# Patient Record
Sex: Male | Born: 1950 | Race: White | Hispanic: No | Smoking: Never smoker
Health system: Southern US, Community
[De-identification: ages and names within clinical notes are randomized; demographics above are authoritative.]

## PROBLEM LIST (undated history)

## (undated) DIAGNOSIS — I639 Cerebral infarction, unspecified: Secondary | ICD-10-CM

## (undated) DIAGNOSIS — Z87442 Personal history of urinary calculi: Secondary | ICD-10-CM

## (undated) DIAGNOSIS — T7840XA Allergy, unspecified, initial encounter: Secondary | ICD-10-CM

## (undated) DIAGNOSIS — K219 Gastro-esophageal reflux disease without esophagitis: Secondary | ICD-10-CM

## (undated) DIAGNOSIS — M109 Gout, unspecified: Secondary | ICD-10-CM

## (undated) DIAGNOSIS — H269 Unspecified cataract: Secondary | ICD-10-CM

## (undated) DIAGNOSIS — IMO0001 Reserved for inherently not codable concepts without codable children: Secondary | ICD-10-CM

## (undated) DIAGNOSIS — M1990 Unspecified osteoarthritis, unspecified site: Secondary | ICD-10-CM

## (undated) DIAGNOSIS — I219 Acute myocardial infarction, unspecified: Secondary | ICD-10-CM

## (undated) DIAGNOSIS — N200 Calculus of kidney: Secondary | ICD-10-CM

## (undated) DIAGNOSIS — H9190 Unspecified hearing loss, unspecified ear: Secondary | ICD-10-CM

## (undated) DIAGNOSIS — Z92241 Personal history of systemic steroid therapy: Secondary | ICD-10-CM

## (undated) DIAGNOSIS — I10 Essential (primary) hypertension: Secondary | ICD-10-CM

## (undated) HISTORY — DX: Essential (primary) hypertension: I10

## (undated) HISTORY — DX: Personal history of urinary calculi: Z87.442

## (undated) HISTORY — PX: SHOULDER SURGERY: SHX246

## (undated) HISTORY — DX: Unspecified hearing loss, unspecified ear: H91.90

## (undated) HISTORY — DX: Unspecified cataract: H26.9

## (undated) HISTORY — DX: Calculus of kidney: N20.0

## (undated) HISTORY — DX: Gastro-esophageal reflux disease without esophagitis: K21.9

## (undated) HISTORY — DX: Allergy, unspecified, initial encounter: T78.40XA

## (undated) HISTORY — PX: GALLBLADDER SURGERY: SHX652

## (undated) HISTORY — DX: Unspecified osteoarthritis, unspecified site: M19.90

## (undated) HISTORY — DX: Gout, unspecified: M10.9

## (undated) HISTORY — DX: Cerebral infarction, unspecified: I63.9

## (undated) HISTORY — PX: BACK SURGERY: SHX140

## (undated) HISTORY — PX: FOOT SURGERY: SHX648

## (undated) HISTORY — PX: SPINE SURGERY: SHX786

## (undated) HISTORY — PX: CHOLECYSTECTOMY: SHX55

## (undated) HISTORY — DX: Reserved for inherently not codable concepts without codable children: IMO0001

## (undated) HISTORY — DX: Acute myocardial infarction, unspecified: I21.9

---
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
## 2011-01-28 HISTORY — PX: EYE SURGERY: SHX253

## 2011-02-18 ENCOUNTER — Ambulatory Visit: Payer: Self-pay | Admitting: Family Medicine

## 2012-11-17 ENCOUNTER — Encounter: Payer: Self-pay | Admitting: Podiatry

## 2012-11-17 ENCOUNTER — Ambulatory Visit (INDEPENDENT_AMBULATORY_CARE_PROVIDER_SITE_OTHER): Payer: PRIVATE HEALTH INSURANCE | Admitting: Podiatry

## 2012-11-17 VITALS — BP 132/82 | HR 62 | Resp 20 | Ht 70.0 in | Wt 220.0 lb

## 2012-11-17 DIAGNOSIS — M722 Plantar fascial fibromatosis: Secondary | ICD-10-CM

## 2012-11-17 NOTE — Progress Notes (Signed)
Thomas Miller presents today for the third followup of his plantar fasciitis to the left foot. At this point he states is doing quite well. Approximately 80-85% better. He states that he wears his night splint occasionally. He is taking his Mobic on a daily basis. He has purchased different pair shoes.  Objective: Vital signs are stable he is alert and oriented x3. Pulses are palpable left lower extremity. Pain on palpation medial calcaneal tubercle of the left heel.  Assessment: Plantar fasciitis slowly resolving left.  Plan: Injected the left heel today with 20 mg of Kenalog and local anesthetic. Discussed the possible need for orthotics. We'll followup with him in one month.

## 2012-12-15 ENCOUNTER — Ambulatory Visit: Payer: PRIVATE HEALTH INSURANCE | Admitting: Podiatry

## 2012-12-15 ENCOUNTER — Ambulatory Visit (INDEPENDENT_AMBULATORY_CARE_PROVIDER_SITE_OTHER): Payer: PRIVATE HEALTH INSURANCE | Admitting: Podiatry

## 2012-12-15 ENCOUNTER — Encounter: Payer: Self-pay | Admitting: Podiatry

## 2012-12-15 VITALS — BP 139/81 | HR 71 | Resp 16 | Ht 70.0 in | Wt 215.0 lb

## 2012-12-15 DIAGNOSIS — L600 Ingrowing nail: Secondary | ICD-10-CM

## 2012-12-15 DIAGNOSIS — M722 Plantar fascial fibromatosis: Secondary | ICD-10-CM

## 2012-12-15 MED ORDER — NEOMYCIN-POLYMYXIN-HC 3.5-10000-1 OT SOLN: OTIC | Status: DC

## 2012-12-15 NOTE — Patient Instructions (Signed)

## 2012-12-15 NOTE — Progress Notes (Signed)
Thomas Miller presents today for followup of his plantar fasciitis. He states that his 85-90% better. He states that he stood on a ladder the other day while at work and hurt his left foot at the plantar fascial calcaneal insertion site. He also complaining of a painful ingrown toenail to the hallux left. States it is had trouble with at all his life in like to have the sides removed.  Objective: Pulses are palpable bilateral vital signs are stable he is alert and oriented x3. He still has pain on palpation medial calcaneal tubercle of the left heel. He has sharp incurvated nail margins along the tibial and fibular border of the hallux left. No erythema saline is drainage or odor.  Assessment: Intractable plantar fasciitis left. Ingrown nail tibial and fibular border hallux left.  Plan: Continue all conservative therapies for the treatment of his plantar fasciitis. And he will be scan for orthotics today. We performed a chemical matrixectomy to the tibial and fibular border of the hallux left. This was performed after 3 cc of 50-50 mixture of Marcaine plain and lidocaine plain was infiltrated in a hallux block about the left foot. The left foot was then prepped and draped in is normal sterile fashion. The margins of the nail were then split from distal to proximal of all since total revealing the matrix and the nailbed. 3 applications of phenol were applied to the matrix of the nailbed it was then neutralized with isopropyl alcohol. Silvadene cream Telfa pad and a dry sterile compressive dressing was applied. He was given both oral and written home-going instructions for the care of this he was her soaking on a twice a day basis in Betadine and water. I wrote her prescription for Cortisporin Otic which she will pick up an apply on a twice a day basis as well. We'll followup with him in one week.

## 2012-12-22 ENCOUNTER — Encounter: Payer: Self-pay | Admitting: Podiatry

## 2012-12-22 ENCOUNTER — Ambulatory Visit (INDEPENDENT_AMBULATORY_CARE_PROVIDER_SITE_OTHER): Payer: PRIVATE HEALTH INSURANCE | Admitting: Podiatry

## 2012-12-22 VITALS — BP 141/80 | HR 64 | Resp 16 | Ht 70.0 in | Wt 215.0 lb

## 2012-12-22 DIAGNOSIS — L600 Ingrowing nail: Secondary | ICD-10-CM

## 2012-12-22 NOTE — Progress Notes (Signed)
Thomas Miller presents today for followup of his matrixectomy hallux left. He continues to soak in Betadine warm water and he does have slight rash to the dorsal aspect of the left foot according him.  Objective: Vital signs are stable he is alert and oriented x3. Strong palpable pulses left lower extremity. Minimal tenderness on palpation medial calcaneal tubercle of the left heel. There is no erythema edema saline is drainage or odor to the hallux tibial and fibular border left foot. Appears to be healing very nicely.  Assessment: Well-healing surgical toe hallux left. Residual plantar fasciitis left foot.  Plan: Discontinue the use of Betadine do to the dermatitis. Start with Epsom salts in warm water soaks still twice daily continued to apply the Cortisporin Otic as directed. Cover during the day and leave open at night. I will followup with him with his orthotics come in.

## 2013-01-06 ENCOUNTER — Encounter: Payer: Self-pay | Admitting: Podiatry

## 2013-01-10 ENCOUNTER — Ambulatory Visit (INDEPENDENT_AMBULATORY_CARE_PROVIDER_SITE_OTHER): Payer: PRIVATE HEALTH INSURANCE | Admitting: Podiatry

## 2013-01-10 DIAGNOSIS — M722 Plantar fascial fibromatosis: Secondary | ICD-10-CM

## 2013-01-10 NOTE — Patient Instructions (Signed)

## 2013-01-10 NOTE — Progress Notes (Signed)
Thomas Miller presented today to pick up his orthotics he was given both oral and written home-going instructions I will followup with him in one month to reevaluate.

## 2013-02-07 ENCOUNTER — Ambulatory Visit: Payer: PRIVATE HEALTH INSURANCE | Admitting: Podiatry

## 2013-02-16 ENCOUNTER — Encounter: Payer: Self-pay | Admitting: Podiatry

## 2013-02-16 ENCOUNTER — Ambulatory Visit (INDEPENDENT_AMBULATORY_CARE_PROVIDER_SITE_OTHER): Payer: PRIVATE HEALTH INSURANCE | Admitting: Podiatry

## 2013-02-16 VITALS — BP 145/90 | HR 70 | Resp 18

## 2013-02-16 DIAGNOSIS — L600 Ingrowing nail: Secondary | ICD-10-CM

## 2013-02-16 MED ORDER — NEOMYCIN-POLYMYXIN-HC 3.5-10000-1 OT SOLN
OTIC | Status: DC
Start: 2013-02-16 — End: 2014-10-25

## 2013-02-16 NOTE — Patient Instructions (Addendum)

## 2013-02-16 NOTE — Progress Notes (Signed)
The orthotics are wonderful,  i have a toenail that is giving me problems, the right great toenail both corners hurt. He states it is been hurting for quite sometimes other problems with this for years now time to get it fixed.  Objective: Vital signs are stable he is alert and oriented x3. Pulses are strongly palpable right lower extremity. Sharp incurvated nail margins with erythema and edema along the tibial and fibular borders of the hallux right.  Assessment: Ingrown nail paronychia hallux right.  Plan: Chemical matrixectomy was performed today to the tibial and fibular border of the hallux right after local anesthetic was provided. He was her soaking on a twice a day basis and Betadine water and apply Cortisporin otic as directed I will followup with him in one week to

## 2013-02-23 ENCOUNTER — Ambulatory Visit (INDEPENDENT_AMBULATORY_CARE_PROVIDER_SITE_OTHER): Payer: PRIVATE HEALTH INSURANCE | Admitting: Podiatry

## 2013-02-23 ENCOUNTER — Encounter: Payer: Self-pay | Admitting: Podiatry

## 2013-02-23 VITALS — BP 127/87 | HR 86 | Resp 16 | Ht 70.0 in | Wt 215.0 lb

## 2013-02-23 DIAGNOSIS — Z9889 Other specified postprocedural states: Secondary | ICD-10-CM

## 2013-02-23 NOTE — Progress Notes (Signed)
Peyton NajjarLarry presents today one week status post matrixectomy tibial and fibular border of the hallux right. Continues to soak in Epsom salts.  Objective: Vital signs are stable he is alert and oriented x3. Currently there is no erythema edema cellulitis drainage or odor to the tibial and fibular border of the hallux right.  Assessment: Well-healing surgical toe hallux right.  Plan: Increase concentration of salt and water. Continue so twice daily until completely healed. He'll covered in the day and leave open at night.

## 2014-10-25 ENCOUNTER — Encounter: Payer: Self-pay | Admitting: Family Medicine

## 2014-10-25 ENCOUNTER — Ambulatory Visit (INDEPENDENT_AMBULATORY_CARE_PROVIDER_SITE_OTHER): Payer: PRIVATE HEALTH INSURANCE | Admitting: Family Medicine

## 2014-10-25 VITALS — BP 150/86 | HR 58 | Temp 97.6°F | Ht 68.8 in | Wt 224.0 lb

## 2014-10-25 DIAGNOSIS — N400 Enlarged prostate without lower urinary tract symptoms: Secondary | ICD-10-CM | POA: Diagnosis not present

## 2014-10-25 DIAGNOSIS — K219 Gastro-esophageal reflux disease without esophagitis: Secondary | ICD-10-CM | POA: Diagnosis not present

## 2014-10-25 DIAGNOSIS — IMO0001 Reserved for inherently not codable concepts without codable children: Secondary | ICD-10-CM

## 2014-10-25 DIAGNOSIS — M1000 Idiopathic gout, unspecified site: Secondary | ICD-10-CM

## 2014-10-25 DIAGNOSIS — Z0000 Encounter for general adult medical examination without abnormal findings: Secondary | ICD-10-CM | POA: Diagnosis not present

## 2014-10-25 DIAGNOSIS — I10 Essential (primary) hypertension: Secondary | ICD-10-CM | POA: Diagnosis not present

## 2014-10-25 DIAGNOSIS — E785 Hyperlipidemia, unspecified: Secondary | ICD-10-CM | POA: Diagnosis not present

## 2014-10-25 DIAGNOSIS — M109 Gout, unspecified: Secondary | ICD-10-CM | POA: Insufficient documentation

## 2014-10-25 HISTORY — DX: Benign prostatic hyperplasia without lower urinary tract symptoms: N40.0

## 2014-10-25 HISTORY — DX: Reserved for inherently not codable concepts without codable children: IMO0001

## 2014-10-25 HISTORY — DX: Essential (primary) hypertension: I10

## 2014-10-25 HISTORY — DX: Hyperlipidemia, unspecified: E78.5

## 2014-10-25 HISTORY — DX: Gastro-esophageal reflux disease without esophagitis: K21.9

## 2014-10-25 MED ORDER — MELOXICAM 15 MG PO TABS: 15.0000 mg | ORAL_TABLET | Freq: Every day | ORAL | Status: DC

## 2014-10-25 MED ORDER — VALSARTAN 160 MG PO TABS: 160.0000 mg | ORAL_TABLET | Freq: Every day | ORAL | Status: DC

## 2014-10-25 MED ORDER — ESOMEPRAZOLE MAGNESIUM 40 MG PO CPDR: 40.0000 mg | DELAYED_RELEASE_CAPSULE | Freq: Every day | ORAL | Status: DC

## 2014-10-25 MED ORDER — ALLOPURINOL 100 MG PO TABS: 100.0000 mg | ORAL_TABLET | Freq: Every day | ORAL | Status: DC

## 2014-10-25 MED ORDER — ATORVASTATIN CALCIUM 20 MG PO TABS: 20.0000 mg | ORAL_TABLET | Freq: Every day | ORAL | Status: DC

## 2014-10-25 NOTE — Assessment & Plan Note (Signed)
The current medical regimen is effective;  continue present plan and medications.  

## 2014-10-25 NOTE — Addendum Note (Signed)
Addended byVonita Moss on: 10/25/2014 09:46 AM   Modules accepted: Kipp Brood

## 2014-10-25 NOTE — Progress Notes (Signed)
BP 150/86 mmHg  Pulse 58  Temp(Src) 97.6 F (36.4 C)  Ht 5' 8.8" (1.748 m)  Wt 224 lb (101.606 kg)  BMI 33.25 kg/m2  SpO2 99%   Subjective:    Patient ID: Thomas Miller, male    DOB: 1950/03/12, 64 y.o.   MRN: [id number]  HPI: Thomas Miller is a 64 y.o. male  Chief Complaint  Patient presents with  . Annual Exam   Patient doing well no symptoms of gout no problems from medication. Reflux doing well on Nexium no issues Meloxicam specially for right hand and shoulder doing well Blood pressure good control no side effects from medications Lipitor also doing well no side effects. Takes medications faithfully without problems  retiring next month  Relevant past medical, surgical, family and social history reviewed and updated as indicated. Interim medical history since our last visit reviewed. Allergies and medications reviewed and updated.  Review of Systems  Constitutional: Negative.   HENT: Negative.   Eyes: Negative.   Respiratory: Negative.   Cardiovascular: Negative.   Gastrointestinal: Negative.   Endocrine: Negative.   Genitourinary: Negative.   Musculoskeletal: Negative.   Skin: Negative.   Allergic/Immunologic: Negative.   Neurological: Negative.   Hematological: Negative.   Psychiatric/Behavioral: Negative.     Per HPI unless specifically indicated above     Objective:    BP 150/86 mmHg  Pulse 58  Temp(Src) 97.6 F (36.4 C)  Ht 5' 8.8" (1.748 m)  Wt 224 lb (101.606 kg)  BMI 33.25 kg/m2  SpO2 99%  Wt Readings from Last 3 Encounters:  10/25/14 224 lb (101.606 kg)  02/23/13 215 lb (97.523 kg)  12/22/12 215 lb (97.523 kg)    Physical Exam  Constitutional: He is oriented to person, place, and time. He appears well-developed and well-nourished.  HENT:  Head: Normocephalic and atraumatic.  Right Ear: External ear normal.  Left Ear: External ear normal.  Eyes: Conjunctivae and EOM are normal. Pupils are equal, round, and reactive to light.   Neck: Normal range of motion. Neck supple.  Cardiovascular: Normal rate, regular rhythm, normal heart sounds and intact distal pulses.   Pulmonary/Chest: Effort normal and breath sounds normal.  Abdominal: Soft. Bowel sounds are normal. There is no splenomegaly or hepatomegaly.  Genitourinary: Rectum normal and penis normal.  Prostate enlarged  Musculoskeletal: Normal range of motion.  Neurological: He is alert and oriented to person, place, and time. He has normal reflexes.  Skin: No rash noted. No erythema.  Psychiatric: He has a normal mood and affect. His behavior is normal. Judgment and thought content normal.    No results found for this or any previous visit.    Assessment & Plan:   Problem List Items Addressed This Visit      Cardiovascular and Mediastinum   Hypertension - Primary    The current medical regimen is effective;  continue present plan and medications.       Relevant Medications   atorvastatin (LIPITOR) 20 MG tablet   valsartan (DIOVAN) 160 MG tablet   Other Relevant Orders   Comprehensive metabolic panel   Lipid panel   CBC with Differential/Platelet   Urinalysis, Routine w reflex microscopic (not at [facility]   TSH     Genitourinary   BPH (benign prostatic hyperplasia)   Relevant Orders   PSA     Other   Gout    The current medical regimen is effective;  continue present plan and medications.  Relevant Medications   allopurinol (ZYLOPRIM) 100 MG tablet   Reflux    The current medical regimen is effective;  continue present plan and medications.       Relevant Medications   esomeprazole (NEXIUM) 40 MG capsule   Hyperlipemia    The current medical regimen is effective;  continue present plan and medications.       Relevant Medications   atorvastatin (LIPITOR) 20 MG tablet   valsartan (DIOVAN) 160 MG tablet   Other Relevant Orders   Comprehensive metabolic panel   Lipid panel   CBC with Differential/Platelet   Urinalysis, Routine  w reflex microscopic (not at [facility])   TSH    Other Visit Diagnoses    PE (physical exam), annual        Relevant Orders    Comprehensive metabolic panel    Lipid panel    CBC with Differential/Platelet    PSA    Urinalysis, Routine w reflex microscopic (not at [facility])    TSH       Patient are to has a living Will health care power of attorney state plan etc. Welcome to [facility] metrics met and discussed.  Follow up plan: Return in about 6 months (around 04/24/2015), or if symptoms worsen or fail to improve, for BMP, lipids,alt, ast.

## 2014-10-26 ENCOUNTER — Encounter: Payer: Self-pay | Admitting: Family Medicine

## 2014-10-26 LAB — CBC WITH DIFFERENTIAL/PLATELET
Basophils Absolute: 0 10*3/uL (ref 0.0–0.2)
Basos: 1 %
EOS (ABSOLUTE): 0.2 10*3/uL (ref 0.0–0.4)
Eos: 2 %
Hematocrit: 47.6 % (ref 37.5–51.0)
Hemoglobin: 16.2 g/dL (ref 12.6–17.7)
Immature Grans (Abs): 0 10*3/uL (ref 0.0–0.1)
Immature Granulocytes: 1 %
Lymphocytes Absolute: 2.3 10*3/uL (ref 0.7–3.1)
Lymphs: 32 %
MCH: 29.4 pg (ref 26.6–33.0)
MCHC: 34 g/dL (ref 31.5–35.7)
MCV: 86 fL (ref 79–97)
Monocytes Absolute: 0.7 10*3/uL (ref 0.1–0.9)
Monocytes: 9 %
Neutrophils Absolute: 4 10*3/uL (ref 1.4–7.0)
Neutrophils: 55 %
Platelets: 205 10*3/uL (ref 150–379)
RBC: 5.51 x10E6/uL (ref 4.14–5.80)
RDW: 14.6 % (ref 12.3–15.4)
WBC: 7.2 10*3/uL (ref 3.4–10.8)

## 2014-10-26 LAB — COMPREHENSIVE METABOLIC PANEL
ALT: 40 IU/L (ref 0–44)
AST: 25 IU/L (ref 0–40)
Albumin/Globulin Ratio: 2.4 (ref 1.1–2.5)
Albumin: 4.7 g/dL (ref 3.6–4.8)
Alkaline Phosphatase: 81 IU/L (ref 39–117)
BUN/Creatinine Ratio: 16 (ref 10–22)
BUN: 15 mg/dL (ref 8–27)
Bilirubin Total: 0.7 mg/dL (ref 0.0–1.2)
CO2: 25 mmol/L (ref 18–29)
Calcium: 9.5 mg/dL (ref 8.6–10.2)
Chloride: 104 mmol/L (ref 97–108)
Creatinine, Ser: 0.96 mg/dL (ref 0.76–1.27)
GFR calc Af Amer: 95 mL/min/{1.73_m2} (ref >59)
GFR calc non Af Amer: 83 mL/min/{1.73_m2} (ref >59)
Globulin, Total: 2 g/dL (ref 1.5–4.5)
Glucose: 93 mg/dL (ref 65–99)
Potassium: 5 mmol/L (ref 3.5–5.2)
Sodium: 142 mmol/L (ref 134–144)
Total Protein: 6.7 g/dL (ref 6.0–8.5)

## 2014-10-26 LAB — LIPID PANEL
Chol/HDL Ratio: 3.4 ratio units (ref 0.0–5.0)
Cholesterol, Total: 151 mg/dL (ref 100–199)
HDL: 45 mg/dL (ref >39)
LDL Calculated: 80 mg/dL (ref 0–99)
Triglycerides: 129 mg/dL (ref 0–149)
VLDL Cholesterol Cal: 26 mg/dL (ref 5–40)

## 2014-10-26 LAB — PSA: Prostate Specific Ag, Serum: 2.5 ng/mL (ref 0.0–4.0)

## 2014-10-26 LAB — TSH: TSH: 2.21 u[IU]/mL (ref 0.450–4.500)

## 2014-10-27 LAB — URINALYSIS, ROUTINE W REFLEX MICROSCOPIC
Bilirubin, UA: NEGATIVE
Glucose, UA: NEGATIVE
Ketones, UA: NEGATIVE
Leukocytes, UA: NEGATIVE
Nitrite, UA: NEGATIVE
Protein, UA: NEGATIVE
RBC, UA: NEGATIVE
Specific Gravity, UA: 1.025 (ref 1.005–1.030)
Urobilinogen, Ur: 0.2 mg/dL (ref 0.2–1.0)
pH, UA: 5.5 (ref 5.0–7.5)

## 2015-01-15 NOTE — Telephone Encounter (Signed)
This encounter was created in error - please disregard.

## 2015-02-21 ENCOUNTER — Other Ambulatory Visit: Payer: Self-pay | Admitting: Family Medicine

## 2015-04-26 ENCOUNTER — Ambulatory Visit: Payer: Medicare Other | Admitting: Family Medicine

## 2015-05-07 ENCOUNTER — Encounter: Payer: Self-pay | Admitting: Family Medicine

## 2015-05-07 ENCOUNTER — Ambulatory Visit (INDEPENDENT_AMBULATORY_CARE_PROVIDER_SITE_OTHER): Payer: Medicare Other | Admitting: Family Medicine

## 2015-05-07 VITALS — BP 135/70 | HR 71 | Temp 97.9°F | Ht 69.0 in | Wt 221.0 lb

## 2015-05-07 DIAGNOSIS — M1000 Idiopathic gout, unspecified site: Secondary | ICD-10-CM | POA: Diagnosis not present

## 2015-05-07 DIAGNOSIS — Z113 Encounter for screening for infections with a predominantly sexual mode of transmission: Secondary | ICD-10-CM | POA: Diagnosis not present

## 2015-05-07 DIAGNOSIS — E785 Hyperlipidemia, unspecified: Secondary | ICD-10-CM | POA: Diagnosis not present

## 2015-05-07 DIAGNOSIS — I10 Essential (primary) hypertension: Secondary | ICD-10-CM

## 2015-05-07 LAB — LP+ALT+AST PICCOLO, WAIVED
ALT (SGPT) Piccolo, Waived: 38 U/L (ref 10–47)
AST (SGOT) Piccolo, Waived: 37 U/L (ref 11–38)
Chol/HDL Ratio Piccolo,Waive: 3.7 mg/dL
Cholesterol Piccolo, Waived: 160 mg/dL (ref <200)
HDL Chol Piccolo, Waived: 43 mg/dL — ABNORMAL LOW (ref >59)
LDL Chol Calc Piccolo Waived: 42 mg/dL (ref <100)
Triglycerides Piccolo,Waived: 376 mg/dL — ABNORMAL HIGH (ref <150)
VLDL Chol Calc Piccolo,Waive: 75 mg/dL — ABNORMAL HIGH (ref <30)

## 2015-05-07 NOTE — Assessment & Plan Note (Signed)
The current medical regimen is effective;  continue present plan and medications.  

## 2015-05-07 NOTE — Progress Notes (Signed)
BP 135/70 mmHg  Pulse 71  Temp(Src) 97.9 F (36.6 C)  Ht  (1.753 m)  Wt 221 lb (100.245 kg)  BMI 32.62 kg/m2  SpO2 96%   Subjective:    Patient ID: Thomas Miller, male    DOB: [birth year], 65 y.o.   MRN: [id number]  HPI: Thomas Miller is a 65 y.o. male  Chief Complaint  Patient presents with  . Hyperlipidemia  . Hypertension  . ear itching    was ringing but that has resolved  Patient doing well no complaints from medications had some left ear itching some ringing for one day which is now resolved. Has tried some sweet oil in his ear which is helped a little bit Has completely retired and blood pressure and cholesterol are doing well with no complaints. Actually better than in some time with retirement.  Relevant past medical, surgical, family and social history reviewed and updated as indicated. Interim medical history since our last visit reviewed. Allergies and medications reviewed and updated.  Review of Systems  Constitutional: Negative.   Respiratory: Negative.   Cardiovascular: Negative.     Per HPI unless specifically indicated above     Objective:    BP 135/70 mmHg  Pulse 71  Temp(Src) 97.9 F (36.6 C)  Ht  (1.753 m)  Wt 221 lb (100.245 kg)  BMI 32.62 kg/m2  SpO2 96%  Wt Readings from Last 3 Encounters:  05/07/15 221 lb (100.245 kg)  10/25/14 224 lb (101.606 kg)  02/23/13 215 lb (97.523 kg)    Physical Exam  HENT:  Right Ear: External ear normal.  Left Ear: External ear normal.  Mouth/Throat: Oropharynx is clear and moist. No oropharyngeal exudate.  Cardiovascular: Normal rate, regular rhythm and normal heart sounds.   Pulmonary/Chest: Breath sounds normal.    Results for orders placed or performed in visit on 10/25/14  Comprehensive metabolic panel  Result Value Ref Range   Glucose 93 65 - 99 mg/dL   BUN 15 8 - 27 mg/dL   Creatinine, Ser 1.32 0.76 - 1.27 mg/dL   GFR calc non Af Amer 83 >59 mL/min/1.73   GFR calc Af Amer 95 >59  mL/min/1.73   BUN/Creatinine Ratio 16 10 - 22   Sodium 142 134 - 144 mmol/L   Potassium 5.0 3.5 - 5.2 mmol/L   Chloride 104 97 - 108 mmol/L   CO2 25 18 - 29 mmol/L   Calcium 9.5 8.6 - 10.2 mg/dL   Total Protein 6.7 6.0 - 8.5 g/dL   Albumin 4.7 3.6 - 4.8 g/dL   Globulin, Total 2.0 1.5 - 4.5 g/dL   Albumin/Globulin Ratio 2.4 1.1 - 2.5   Bilirubin Total 0.7 0.0 - 1.2 mg/dL   Alkaline Phosphatase 81 39 - 117 IU/L   AST 25 0 - 40 IU/L   ALT 40 0 - 44 IU/L  Lipid panel  Result Value Ref Range   Cholesterol, Total 151 100 - 199 mg/dL   Triglycerides 440 0 - 149 mg/dL   HDL 45 >10 mg/dL   VLDL Cholesterol Cal 26 5 - 40 mg/dL   LDL Calculated 80 0 - 99 mg/dL   Chol/HDL Ratio 3.4 0.0 - 5.0 ratio units  CBC with Differential/Platelet  Result Value Ref Range   WBC 7.2 3.4 - 10.8 x10E3/uL   RBC 5.51 4.14 - 5.80 x10E6/uL   Hemoglobin 16.2 12.6 - 17.7 g/dL   Hematocrit 27.2 53.6 - 51.0 %   MCV 86 79 -  97 fL   MCH 29.4 26.6 - 33.0 pg   MCHC 34.0 31.5 - 35.7 g/dL   RDW 96.014.6 45.412.3 - 09.815.4 %   Platelets 205 150 - 379 x10E3/uL   Neutrophils 55 %   Lymphs 32 %   Monocytes 9 %   Eos 2 %   Basos 1 %   Neutrophils Absolute 4.0 1.4 - 7.0 x10E3/uL   Lymphocytes Absolute 2.3 0.7 - 3.1 x10E3/uL   Monocytes Absolute 0.7 0.1 - 0.9 x10E3/uL   EOS (ABSOLUTE) 0.2 0.0 - 0.4 x10E3/uL   Basophils Absolute 0.0 0.0 - 0.2 x10E3/uL   Immature Granulocytes 1 %   Immature Grans (Abs) 0.0 0.0 - 0.1 x10E3/uL  PSA  Result Value Ref Range   Prostate Specific Ag, Serum 2.5 0.0 - 4.0 ng/mL  Urinalysis, Routine w reflex microscopic (not [facility])  Result Value Ref Range   Specific Gravity, UA 1.025 1.005 - 1.030   pH, UA 5.5 5.0 - 7.5   Color, UA Yellow Yellow   Appearance Ur Clear Clear   Leukocytes, UA Negative Negative   Protein, UA Negative Negative/Trace   Glucose, UA Negative Negative   Ketones, UA Negative Negative   RBC, UA Negative Negative   Bilirubin, UA Negative Negative   Urobilinogen, Ur 0.2  0.2 - 1.0 mg/dL   Nitrite, UA Negative Negative  TSH  Result Value Ref Range   TSH 2.210 0.450 - 4.500 uIU/mL      Assessment & Plan:   Problem List Items Addressed This Visit      Cardiovascular and Mediastinum   Hypertension - Primary    The current medical regimen is effective;  continue present plan and medications.       Relevant Orders   LP+ALT+AST Piccolo, Waived   Basic metabolic panel     Other   Gout    The current medical regimen is effective;  continue present plan and medications.       Hyperlipemia    The current medical regimen is effective;  continue present plan and medications.       Relevant Orders   LP+ALT+AST Piccolo, Waived   Basic metabolic panel    Other Visit Diagnoses    Routine screening for STI (sexually transmitted infection)        Relevant Orders    Hepatitis C Antibody        Follow up plan: Return for Physical Exam September.

## 2015-05-08 ENCOUNTER — Encounter: Payer: Self-pay | Admitting: Family Medicine

## 2015-05-08 ENCOUNTER — Telehealth: Payer: Self-pay

## 2015-05-08 LAB — BASIC METABOLIC PANEL
BUN/Creatinine Ratio: 21 (ref 10–24)
BUN: 18 mg/dL (ref 8–27)
CO2: 23 mmol/L (ref 18–29)
Calcium: 9.7 mg/dL (ref 8.6–10.2)
Chloride: 99 mmol/L (ref 96–106)
Creatinine, Ser: 0.84 mg/dL (ref 0.76–1.27)
GFR calc Af Amer: 106 mL/min/{1.73_m2} (ref >59)
GFR calc non Af Amer: 92 mL/min/{1.73_m2} (ref >59)
Glucose: 104 mg/dL — ABNORMAL HIGH (ref 65–99)
Potassium: 4.5 mmol/L (ref 3.5–5.2)
Sodium: 140 mmol/L (ref 134–144)

## 2015-05-08 LAB — HEPATITIS C ANTIBODY: Hep C Virus Ab: 0.1 s/co ratio (ref 0.0–0.9)

## 2015-05-08 NOTE — Telephone Encounter (Signed)
[facility] [address] [facility] for Fluticasone Nasal Spray

## 2015-06-20 ENCOUNTER — Other Ambulatory Visit: Payer: Self-pay | Admitting: Family Medicine

## 2015-09-10 ENCOUNTER — Other Ambulatory Visit: Payer: Self-pay | Admitting: Family Medicine

## 2015-10-26 ENCOUNTER — Encounter (INDEPENDENT_AMBULATORY_CARE_PROVIDER_SITE_OTHER): Payer: Self-pay

## 2015-11-05 ENCOUNTER — Other Ambulatory Visit: Payer: Self-pay | Admitting: Family Medicine

## 2015-11-19 ENCOUNTER — Ambulatory Visit (INDEPENDENT_AMBULATORY_CARE_PROVIDER_SITE_OTHER): Payer: Medicare Other | Admitting: Family Medicine

## 2015-11-19 ENCOUNTER — Encounter: Payer: Self-pay | Admitting: Family Medicine

## 2015-11-19 VITALS — BP 131/86 | HR 75 | Temp 97.6°F | Ht 69.75 in | Wt 218.0 lb

## 2015-11-19 DIAGNOSIS — Z0000 Encounter for general adult medical examination without abnormal findings: Secondary | ICD-10-CM | POA: Diagnosis not present

## 2015-11-19 DIAGNOSIS — K219 Gastro-esophageal reflux disease without esophagitis: Secondary | ICD-10-CM

## 2015-11-19 DIAGNOSIS — I10 Essential (primary) hypertension: Secondary | ICD-10-CM | POA: Diagnosis not present

## 2015-11-19 DIAGNOSIS — E7800 Pure hypercholesterolemia, unspecified: Secondary | ICD-10-CM | POA: Diagnosis not present

## 2015-11-19 DIAGNOSIS — N400 Enlarged prostate without lower urinary tract symptoms: Secondary | ICD-10-CM | POA: Diagnosis not present

## 2015-11-19 DIAGNOSIS — Z23 Encounter for immunization: Secondary | ICD-10-CM | POA: Diagnosis not present

## 2015-11-19 DIAGNOSIS — M1000 Idiopathic gout, unspecified site: Secondary | ICD-10-CM | POA: Diagnosis not present

## 2015-11-19 LAB — URINALYSIS, ROUTINE W REFLEX MICROSCOPIC
Bilirubin, UA: NEGATIVE
Glucose, UA: NEGATIVE
Ketones, UA: NEGATIVE
Leukocytes, UA: NEGATIVE
Nitrite, UA: NEGATIVE
RBC, UA: NEGATIVE
Specific Gravity, UA: 1.02 (ref 1.005–1.030)
Urobilinogen, Ur: 1 mg/dL (ref 0.2–1.0)
pH, UA: 5.5 (ref 5.0–7.5)

## 2015-11-19 LAB — MICROSCOPIC EXAMINATION
Bacteria, UA: NONE SEEN
RBC, UA: NONE SEEN /hpf (ref 0–?)

## 2015-11-19 MED ORDER — VALSARTAN 160 MG PO TABS: 160.0000 mg | ORAL_TABLET | Freq: Every day | ORAL | 4 refills | Status: DC

## 2015-11-19 MED ORDER — MELOXICAM 15 MG PO TABS: 15.0000 mg | ORAL_TABLET | Freq: Every day | ORAL | 4 refills | Status: DC

## 2015-11-19 MED ORDER — ESOMEPRAZOLE MAGNESIUM 40 MG PO CPDR: 40.0000 mg | DELAYED_RELEASE_CAPSULE | Freq: Every day | ORAL | 4 refills | Status: DC

## 2015-11-19 MED ORDER — ALLOPURINOL 300 MG PO TABS: 300.0000 mg | ORAL_TABLET | Freq: Every day | ORAL | 4 refills | Status: DC

## 2015-11-19 MED ORDER — ATORVASTATIN CALCIUM 20 MG PO TABS: 20.0000 mg | ORAL_TABLET | Freq: Every day | ORAL | 4 refills | Status: DC

## 2015-11-19 NOTE — Assessment & Plan Note (Signed)
The current medical regimen is effective;  continue present plan and medications.  

## 2015-11-19 NOTE — Progress Notes (Signed)
BP 131/86   Pulse 75   Temp 97.6 F (36.4 C)   Ht 5' 9.75" (1.772 m)   Wt 218 lb (98.9 kg)   SpO2 98%   BMI 31.50 kg/m    Subjective:    Patient ID: Thomas Miller, male    DOB: April 28, 1950, 65 y.o.   MRN: [id number]  HPI: Thomas Miller is a 65 y.o. male  Chief Complaint  Patient presents with  . Annual Exam    patient would like to have the cologuard test  AWV metrics met Patient follow-up doing well no gout symptoms no complaints from allopurinol Blood pressure cholesterol doing well no complaints from medications takes faithfully Reflux stable Arthritis meloxicam doesn't seem to be doing a lot Discuss may take intermittently or not at all aspirin but not with motor vehicle other than baby aspirin and Tylenol.  Relevant past medical, surgical, family and social history reviewed and updated as indicated. Interim medical history since our last visit reviewed. Allergies and medications reviewed and updated.  Review of Systems  Constitutional: Negative.   HENT: Negative.   Eyes: Negative.   Respiratory: Negative.   Cardiovascular: Negative.   Gastrointestinal: Negative.   Endocrine: Negative.   Genitourinary: Negative.   Musculoskeletal: Negative.   Skin: Negative.   Allergic/Immunologic: Negative.   Neurological: Negative.   Hematological: Negative.   Psychiatric/Behavioral: Negative.     Per HPI unless specifically indicated above     Objective:    BP 131/86   Pulse 75   Temp 97.6 F (36.4 C)   Ht 5' 9.75" (1.772 m)   Wt 218 lb (98.9 kg)   SpO2 98%   BMI 31.50 kg/m   Wt Readings from Last 3 Encounters:  11/19/15 218 lb (98.9 kg)  05/07/15 221 lb (100.2 kg)  10/25/14 224 lb (101.6 kg)    Physical Exam  Constitutional: He is oriented to person, place, and time. He appears well-developed and well-nourished.  HENT:  Head: Normocephalic and atraumatic.  Right Ear: External ear normal.  Left Ear: External ear normal.  Eyes: Conjunctivae and EOM are  normal. Pupils are equal, round, and reactive to light.  Neck: Normal range of motion. Neck supple.  Cardiovascular: Normal rate, regular rhythm, normal heart sounds and intact distal pulses.   Pulmonary/Chest: Effort normal and breath sounds normal.  Abdominal: Soft. Bowel sounds are normal. There is no splenomegaly or hepatomegaly.  Genitourinary: Rectum normal and penis normal.  Genitourinary Comments: Prostate enlarged  Musculoskeletal: Normal range of motion.  Neurological: He is alert and oriented to person, place, and time. He has normal reflexes.  Skin: No rash noted. No erythema.  Psychiatric: He has a normal mood and affect. His behavior is normal. Judgment and thought content normal.    Results for orders placed or performed in visit on 05/07/15  LP+ALT+AST Piccolo, [employer]  Result Value Ref Range   ALT (SGPT) Piccolo, Waived 38 10 - 47 U/L   AST (SGOT) Piccolo, Waived 37 11 - 38 U/L   Cholesterol Piccolo, Waived 160 <200 mg/dL   HDL Chol Piccolo, Waived 43 (L) >59 mg/dL   Triglycerides Piccolo,Waived 376 (H) <150 mg/dL   Chol/HDL Ratio Piccolo,Waive 3.7 mg/dL   LDL Chol Calc Piccolo Waived 42 <100 mg/dL   VLDL Chol Calc Piccolo,Waive 75 (H) <30 mg/dL  Basic metabolic panel  Result Value Ref Range   Glucose 104 (H) 65 - 99 mg/dL   BUN 18 8 - 27 mg/dL   Creatinine,  Ser 0.84 0.76 - 1.27 mg/dL   GFR calc non Af Amer 92 >59 mL/min/1.73   GFR calc Af Amer 106 >59 mL/min/1.73   BUN/Creatinine Ratio 21 10 - 24   Sodium 140 134 - 144 mmol/L   Potassium 4.5 3.5 - 5.2 mmol/L   Chloride 99 96 - 106 mmol/L   CO2 23 18 - 29 mmol/L   Calcium 9.7 8.6 - 10.2 mg/dL  Hepatitis C Antibody  Result Value Ref Range   Hep C Virus Ab 0.1 0.0 - 0.9 s/co ratio      Assessment & Plan:   Problem List Items Addressed This Visit      Cardiovascular and Mediastinum   Hypertension    The current medical regimen is effective;  continue present plan and medications.       Relevant  Medications   valsartan (DIOVAN) 160 MG tablet   atorvastatin (LIPITOR) 20 MG tablet   Other Relevant Orders   Comprehensive metabolic panel   Lipid panel   CBC with Differential/Platelet   TSH   Urinalysis, Routine w reflex microscopic (not at [facility])     Digestive   GERD (gastroesophageal reflux disease)    The current medical regimen is effective;  continue present plan and medications.       Relevant Medications   esomeprazole (NEXIUM) 40 MG capsule   Other Relevant Orders   Lipid panel   CBC with Differential/Platelet   TSH     Genitourinary   BPH (benign prostatic hyperplasia)    The current medical regimen is effective;  continue present plan and medications.       Relevant Orders   PSA     Other   Gout    The current medical regimen is effective;  continue present plan and medications.       Relevant Medications   allopurinol (ZYLOPRIM) 300 MG tablet   Other Relevant Orders   Uric acid   Hyperlipemia    The current medical regimen is effective;  continue present plan and medications.       Relevant Medications   valsartan (DIOVAN) 160 MG tablet   atorvastatin (LIPITOR) 20 MG tablet   Other Relevant Orders   Comprehensive metabolic panel   Lipid panel   CBC with Differential/Platelet   TSH    Other Visit Diagnoses    Need for pneumococcal vaccination    -  Primary   Relevant Orders   Pneumococcal conjugate vaccine 13-valent (Completed)   PE (physical exam), annual       Relevant Orders   Comprehensive metabolic panel   Lipid panel   CBC with Differential/Platelet   TSH   Urinalysis, Routine w reflex microscopic (not at [facility])   PSA   Uric acid       Follow up plan: Return in about 6 months (around 05/19/2016) for BMP,  Lipids, ALT, AST, uric acid.

## 2015-11-20 ENCOUNTER — Encounter: Payer: Self-pay | Admitting: Family Medicine

## 2015-11-20 LAB — COMPREHENSIVE METABOLIC PANEL
ALT: 36 IU/L (ref 0–44)
AST: 30 IU/L (ref 0–40)
Albumin/Globulin Ratio: 2 (ref 1.2–2.2)
Albumin: 4.7 g/dL (ref 3.6–4.8)
Alkaline Phosphatase: 83 IU/L (ref 39–117)
BUN/Creatinine Ratio: 15 (ref 10–24)
BUN: 15 mg/dL (ref 8–27)
Bilirubin Total: 0.7 mg/dL (ref 0.0–1.2)
CO2: 23 mmol/L (ref 18–29)
Calcium: 9.6 mg/dL (ref 8.6–10.2)
Chloride: 105 mmol/L (ref 96–106)
Creatinine, Ser: 1.03 mg/dL (ref 0.76–1.27)
GFR calc Af Amer: 87 mL/min/{1.73_m2} (ref >59)
GFR calc non Af Amer: 75 mL/min/{1.73_m2} (ref >59)
Globulin, Total: 2.3 g/dL (ref 1.5–4.5)
Glucose: 98 mg/dL (ref 65–99)
Potassium: 4.8 mmol/L (ref 3.5–5.2)
Sodium: 139 mmol/L (ref 134–144)
Total Protein: 7 g/dL (ref 6.0–8.5)

## 2015-11-20 LAB — CBC WITH DIFFERENTIAL/PLATELET
Basophils Absolute: 0 10*3/uL (ref 0.0–0.2)
Basos: 1 %
EOS (ABSOLUTE): 0.2 10*3/uL (ref 0.0–0.4)
Eos: 3 %
Hematocrit: 47.3 % (ref 37.5–51.0)
Hemoglobin: 16 g/dL (ref 12.6–17.7)
Immature Grans (Abs): 0 10*3/uL (ref 0.0–0.1)
Immature Granulocytes: 0 %
Lymphocytes Absolute: 2.6 10*3/uL (ref 0.7–3.1)
Lymphs: 41 %
MCH: 29.5 pg (ref 26.6–33.0)
MCHC: 33.8 g/dL (ref 31.5–35.7)
MCV: 87 fL (ref 79–97)
Monocytes Absolute: 0.7 10*3/uL (ref 0.1–0.9)
Monocytes: 10 %
Neutrophils Absolute: 2.9 10*3/uL (ref 1.4–7.0)
Neutrophils: 45 %
Platelets: 222 10*3/uL (ref 150–379)
RBC: 5.43 x10E6/uL (ref 4.14–5.80)
RDW: 13.5 % (ref 12.3–15.4)
WBC: 6.4 10*3/uL (ref 3.4–10.8)

## 2015-11-20 LAB — URIC ACID: Uric Acid: 4.9 mg/dL (ref 3.7–8.6)

## 2015-11-20 LAB — LIPID PANEL
Chol/HDL Ratio: 3.7 ratio units (ref 0.0–5.0)
Cholesterol, Total: 159 mg/dL (ref 100–199)
HDL: 43 mg/dL (ref >39)
LDL Calculated: 81 mg/dL (ref 0–99)
Triglycerides: 177 mg/dL — ABNORMAL HIGH (ref 0–149)
VLDL Cholesterol Cal: 35 mg/dL (ref 5–40)

## 2015-11-20 LAB — TSH: TSH: 2.22 u[IU]/mL (ref 0.450–4.500)

## 2015-11-20 LAB — PSA: Prostate Specific Ag, Serum: 2.9 ng/mL (ref 0.0–4.0)

## 2015-12-10 DIAGNOSIS — Z1212 Encounter for screening for malignant neoplasm of rectum: Secondary | ICD-10-CM | POA: Diagnosis not present

## 2015-12-10 DIAGNOSIS — Z1211 Encounter for screening for malignant neoplasm of colon: Secondary | ICD-10-CM | POA: Diagnosis not present

## 2015-12-10 LAB — COLOGUARD: Cologuard: NEGATIVE

## 2016-05-19 ENCOUNTER — Ambulatory Visit (INDEPENDENT_AMBULATORY_CARE_PROVIDER_SITE_OTHER): Payer: Medicare Other | Admitting: Family Medicine

## 2016-05-19 ENCOUNTER — Encounter: Payer: Self-pay | Admitting: Family Medicine

## 2016-05-19 VITALS — BP 154/111 | HR 64 | Wt 223.0 lb

## 2016-05-19 DIAGNOSIS — G4700 Insomnia, unspecified: Secondary | ICD-10-CM | POA: Diagnosis not present

## 2016-05-19 DIAGNOSIS — M1000 Idiopathic gout, unspecified site: Secondary | ICD-10-CM | POA: Diagnosis not present

## 2016-05-19 DIAGNOSIS — I10 Essential (primary) hypertension: Secondary | ICD-10-CM | POA: Diagnosis not present

## 2016-05-19 DIAGNOSIS — E7800 Pure hypercholesterolemia, unspecified: Secondary | ICD-10-CM | POA: Diagnosis not present

## 2016-05-19 HISTORY — DX: Insomnia, unspecified: G47.00

## 2016-05-19 LAB — LP+ALT+AST PICCOLO, WAIVED
ALT (SGPT) Piccolo, Waived: 40 U/L (ref 10–47)
AST (SGOT) Piccolo, Waived: 39 U/L — ABNORMAL HIGH (ref 11–38)
Chol/HDL Ratio Piccolo,Waive: 3.1 mg/dL
Cholesterol Piccolo, Waived: 148 mg/dL (ref <200)
HDL Chol Piccolo, Waived: 47 mg/dL — ABNORMAL LOW (ref >59)
LDL Chol Calc Piccolo Waived: 71 mg/dL (ref <100)
Triglycerides Piccolo,Waived: 149 mg/dL (ref <150)
VLDL Chol Calc Piccolo,Waive: 30 mg/dL — ABNORMAL HIGH (ref <30)

## 2016-05-19 MED ORDER — VALSARTAN 320 MG PO TABS: 320.0000 mg | ORAL_TABLET | Freq: Every day | ORAL | 2 refills | Status: DC

## 2016-05-19 NOTE — Assessment & Plan Note (Signed)
The current medical regimen is effective;  continue present plan and medications.  

## 2016-05-19 NOTE — Progress Notes (Addendum)
   BP (!) 154/111   Pulse 64   Wt 223 lb (101.2 kg)   SpO2 99%   BMI 32.23 kg/m    Subjective:    Patient ID: Thomas Miller, male    DOB: [birth year], 66 y.o.   MRN: [id number]  HPI: Thomas Miller is a 66 y.o. male  Chief Complaint  Patient presents with  . Follow-up  . Hypertension  . Gout  Patient all in all doing well no complaints no gout symptoms or signs takes Lipitor without problems reflux doing stable blood pressure concerned has is up here. Patient is concerned has especially on the weekend wakes up at his usual time when he was working at 3 AM and wanting to sleep later hasn't really been daytime drowsiness. Can take a zzzquil and does okay.  Relevant past medical, surgical, family and social history reviewed and updated as indicated. Interim medical history since our last visit reviewed. Allergies and medications reviewed and updated.  Review of Systems  Constitutional: Negative.   Respiratory: Negative.   Cardiovascular: Negative.     Per HPI unless specifically indicated above     Objective:    BP (!) 154/111   Pulse 64   Wt 223 lb (101.2 kg)   SpO2 99%   BMI 32.23 kg/m   Wt Readings from Last 3 Encounters:  05/19/16 223 lb (101.2 kg)  11/19/15 218 lb (98.9 kg)  05/07/15 221 lb (100.2 kg)    Physical Exam  Constitutional: He is oriented to person, place, and time. He appears well-developed and well-nourished.  HENT:  Head: Normocephalic and atraumatic.  Eyes: Conjunctivae and EOM are normal.  Neck: Normal range of motion.  Cardiovascular: Normal rate, regular rhythm and normal heart sounds.   Pulmonary/Chest: Effort normal and breath sounds normal.  Musculoskeletal: Normal range of motion.  Neurological: He is alert and oriented to person, place, and time.  Skin: No erythema.  Psychiatric: He has a normal mood and affect. His behavior is normal. Judgment and thought content normal.    Results for orders placed or performed in visit on 12/18/15    Cologuard  Result Value Ref Range   Cologuard Negative       Assessment & Plan:   Problem List Items Addressed This Visit      Cardiovascular and Mediastinum   Hypertension - Primary    Blood pressure poor control will increase medication to 320 recheck 1 month or so see how blood pressures doing.      Relevant Medications   valsartan (DIOVAN) 320 MG tablet   Other Relevant Orders   Basic metabolic panel   LP+ALT+AST Piccolo, Waived     Other   Gout    The current medical regimen is effective;  continue present plan and medications.       Relevant Orders   Uric acid   Hyperlipemia    The current medical regimen is effective;  continue present plan and medications.       Relevant Medications   valsartan (DIOVAN) 320 MG tablet   Other Relevant Orders   Basic metabolic panel   LP+ALT+AST Piccolo, Waived   Insomnia    Discuss insomnia and uses Benadryl which is okay.          Follow up plan: Return in about 4 weeks (around 06/16/2016) for BMP.

## 2016-05-19 NOTE — Assessment & Plan Note (Signed)
Discuss insomnia and uses Benadryl which is okay.

## 2016-05-19 NOTE — Assessment & Plan Note (Signed)
Blood pressure poor control will increase medication to 320 recheck 1 month or so see how blood pressures doing.

## 2016-05-20 ENCOUNTER — Encounter: Payer: Self-pay | Admitting: Family Medicine

## 2016-05-20 LAB — BASIC METABOLIC PANEL
BUN/Creatinine Ratio: 13 (ref 10–24)
BUN: 14 mg/dL (ref 8–27)
CO2: 24 mmol/L (ref 18–29)
Calcium: 9.4 mg/dL (ref 8.6–10.2)
Chloride: 104 mmol/L (ref 96–106)
Creatinine, Ser: 1.04 mg/dL (ref 0.76–1.27)
GFR calc Af Amer: 86 mL/min/{1.73_m2} (ref >59)
GFR calc non Af Amer: 74 mL/min/{1.73_m2} (ref >59)
Glucose: 98 mg/dL (ref 65–99)
Potassium: 5.1 mmol/L (ref 3.5–5.2)
Sodium: 142 mmol/L (ref 134–144)

## 2016-05-20 LAB — URIC ACID: Uric Acid: 4.8 mg/dL (ref 3.7–8.6)

## 2016-06-19 ENCOUNTER — Ambulatory Visit (INDEPENDENT_AMBULATORY_CARE_PROVIDER_SITE_OTHER): Payer: Medicare Other | Admitting: Family Medicine

## 2016-06-19 ENCOUNTER — Encounter: Payer: Self-pay | Admitting: Family Medicine

## 2016-06-19 VITALS — BP 126/76 | HR 73 | Wt 219.4 lb

## 2016-06-19 DIAGNOSIS — I10 Essential (primary) hypertension: Secondary | ICD-10-CM

## 2016-06-19 DIAGNOSIS — E7800 Pure hypercholesterolemia, unspecified: Secondary | ICD-10-CM

## 2016-06-19 MED ORDER — VALSARTAN 160 MG PO TABS: 160.0000 mg | ORAL_TABLET | Freq: Every day | ORAL | 2 refills | Status: DC

## 2016-06-19 NOTE — Progress Notes (Signed)
BP 126/76 (BP Location: Left Arm)   Pulse 73   Wt 219 lb 6.4 oz (99.5 kg)   SpO2 97%   BMI 31.71 kg/m    Subjective:    Patient ID: Thomas Miller, male    DOB: [birth year], 66 y.o.   MRN: [id number]  HPI: Thomas Miller is a 66 y.o. male  Chief Complaint  Patient presents with  . Follow-up  . Hypertension   Recheck hypertension blood pressures been doing well on double dose valsartan 320. Patient on this dose hasn't felt right little lightheaded and just didn't feel right. Patient's lost weight and walking more doing well. Relevant past medical, surgical, family and social history reviewed and updated as indicated. Interim medical history since our last visit reviewed. Allergies and medications reviewed and updated.  Review of Systems  Constitutional: Negative.   Respiratory: Negative.   Cardiovascular: Negative.     Per HPI unless specifically indicated above     Objective:    BP 126/76 (BP Location: Left Arm)   Pulse 73   Wt 219 lb 6.4 oz (99.5 kg)   SpO2 97%   BMI 31.71 kg/m   Wt Readings from Last 3 Encounters:  06/19/16 219 lb 6.4 oz (99.5 kg)  05/19/16 223 lb (101.2 kg)  11/19/15 218 lb (98.9 kg)    Physical Exam  Constitutional: He is oriented to person, place, and time. He appears well-developed and well-nourished.  HENT:  Head: Normocephalic and atraumatic.  Eyes: Conjunctivae and EOM are normal.  Neck: Normal range of motion.  Cardiovascular: Normal rate, regular rhythm and normal heart sounds.   Pulmonary/Chest: Effort normal and breath sounds normal.  Musculoskeletal: Normal range of motion.  Neurological: He is alert and oriented to person, place, and time.  Skin: No erythema.  Psychiatric: He has a normal mood and affect. His behavior is normal. Judgment and thought content normal.    Results for orders placed or performed in visit on 05/19/16  Basic metabolic panel  Result Value Ref Range   Glucose 98 65 - 99 mg/dL   BUN 14 8 - 27 mg/dL     Creatinine, Ser 0.861.04 0.76 - 1.27 mg/dL   GFR calc non Af Amer 74 >59 mL/min/1.73   GFR calc Af Amer 86 >59 mL/min/1.73   BUN/Creatinine Ratio 13 10 - 24   Sodium 142 134 - 144 mmol/L   Potassium 5.1 3.5 - 5.2 mmol/L   Chloride 104 96 - 106 mmol/L   CO2 24 18 - 29 mmol/L   Calcium 9.4 8.6 - 10.2 mg/dL  LP+ALT+AST Piccolo, Waived  Result Value Ref Range   ALT (SGPT) Piccolo, Waived 40 10 - 47 U/L   AST (SGOT) Piccolo, Waived 39 (H) 11 - 38 U/L   Cholesterol Piccolo, Waived 148 <200 mg/dL   HDL Chol Piccolo, Waived 47 (L) >59 mg/dL   Triglycerides Piccolo,Waived 149 <150 mg/dL   Chol/HDL Ratio Piccolo,Waive 3.1 mg/dL   LDL Chol Calc Piccolo Waived 71 <100 mg/dL   VLDL Chol Calc Piccolo,Waive 30 (H) <30 mg/dL  Uric acid  Result Value Ref Range   Uric Acid 4.8 3.7 - 8.6 mg/dL      Assessment & Plan:   Problem List Items Addressed This Visit      Cardiovascular and Mediastinum   Hypertension - Primary    Hypertension doing well with nonpharmacologic control will decrease valsartan to 160 and observe blood pressure      Relevant Medications  valsartan (DIOVAN) 160 MG tablet   Other Relevant Orders   Basic metabolic panel     Other   Hyperlipemia   Relevant Medications   valsartan (DIOVAN) 160 MG tablet   Other Relevant Orders   Basic metabolic panel     Discussed stress with wife decline with Alzheimer's discussed various approaches and caregiver needs.  Follow up plan: Return in about 6 months (around 12/20/2016) for Physical Exam.

## 2016-06-19 NOTE — Assessment & Plan Note (Signed)
Hypertension doing well with nonpharmacologic control will decrease valsartan to 160 and observe blood pressure

## 2016-06-20 LAB — BASIC METABOLIC PANEL
BUN/Creatinine Ratio: 16 (ref 10–24)
BUN: 16 mg/dL (ref 8–27)
CO2: 20 mmol/L (ref 18–29)
Calcium: 9.4 mg/dL (ref 8.6–10.2)
Chloride: 106 mmol/L (ref 96–106)
Creatinine, Ser: 1 mg/dL (ref 0.76–1.27)
GFR calc Af Amer: 90 mL/min/{1.73_m2} (ref >59)
GFR calc non Af Amer: 78 mL/min/{1.73_m2} (ref >59)
Glucose: 85 mg/dL (ref 65–99)
Potassium: 4.5 mmol/L (ref 3.5–5.2)
Sodium: 143 mmol/L (ref 134–144)

## 2016-06-24 ENCOUNTER — Encounter: Payer: Self-pay | Admitting: Family Medicine

## 2016-08-11 ENCOUNTER — Other Ambulatory Visit: Payer: Self-pay | Admitting: Family Medicine

## 2016-08-11 DIAGNOSIS — I10 Essential (primary) hypertension: Secondary | ICD-10-CM

## 2016-08-25 ENCOUNTER — Encounter: Payer: Self-pay | Admitting: Family Medicine

## 2016-08-25 ENCOUNTER — Ambulatory Visit (INDEPENDENT_AMBULATORY_CARE_PROVIDER_SITE_OTHER): Payer: Medicare Other | Admitting: Family Medicine

## 2016-08-25 DIAGNOSIS — I10 Essential (primary) hypertension: Secondary | ICD-10-CM | POA: Diagnosis not present

## 2016-08-25 DIAGNOSIS — L309 Dermatitis, unspecified: Secondary | ICD-10-CM | POA: Diagnosis not present

## 2016-08-25 DIAGNOSIS — R5383 Other fatigue: Secondary | ICD-10-CM | POA: Diagnosis not present

## 2016-08-25 MED ORDER — CLOTRIMAZOLE-BETAMETHASONE 1-0.05 % EX CREA: 1.0000 "application " | TOPICAL_CREAM | Freq: Two times a day (BID) | CUTANEOUS | 0 refills | Status: DC

## 2016-08-25 NOTE — Assessment & Plan Note (Signed)
Discuss fatigue and possible heat exposure developing heat exhaustion will check BMP and UA. Discuss fluids hydration watermelon etc.

## 2016-08-25 NOTE — Assessment & Plan Note (Signed)
The current medical regimen is effective;  continue present plan and medications.  

## 2016-08-25 NOTE — Assessment & Plan Note (Signed)
Discussed dermatitis will use Chlortrimazole and betamethasone to help treat both fungal and inflammatory changes.

## 2016-08-25 NOTE — Progress Notes (Signed)
BP 138/79   Pulse 73   Wt 215 lb (97.5 kg)   SpO2 97%   BMI 31.07 kg/m    Subjective:    Patient ID: Thomas Miller, male    DOB: [birth year], 66 y.o.   MRN: [id number]  HPI: Thomas KavaLarry G Gravett is a [age] y.o. male  Chief Complaint  Patient presents with  . Rash    Under Left arm. X 1 week. Itchy and painful   Inflamed patch under left arm marked itching's been working in the sun and heat with a lot of sweating. Underneath has satellite lesions. No other rash in other areas. Has been using 1% hydrocortisone cream with minimal effect. Blood pressures doing well no other complaints. Patient also is been working putting a roof on and a lot of heat and sun exposure is feeling just draggy and not right. More tired nonspecific symptoms does eat watermelon.   Relevant past medical, surgical, family and social history reviewed and updated as indicated. Interim medical history since our last visit reviewed. Allergies and medications reviewed and updated.  Review of Systems  Constitutional: Positive for fatigue. Negative for chills, diaphoresis and fever.  HENT: Negative.   Eyes: Negative.   Respiratory: Negative.   Cardiovascular: Negative.     Per HPI unless specifically indicated above     Objective:    BP 138/79   Pulse 73   Wt 215 lb (97.5 kg)   SpO2 97%   BMI 31.07 kg/m   Wt Readings from Last 3 Encounters:  08/25/16 215 lb (97.5 kg)  06/19/16 219 lb 6.4 oz (99.5 kg)  05/19/16 223 lb (101.2 kg)    Physical Exam  Constitutional: He is oriented to person, place, and time. He appears well-developed and well-nourished.  HENT:  Head: Normocephalic and atraumatic.  Eyes: Conjunctivae and EOM are normal.  Neck: Normal range of motion.  Cardiovascular: Normal rate, regular rhythm and normal heart sounds.   Pulmonary/Chest: Effort normal and breath sounds normal.  Abdominal: Soft. Bowel sounds are normal. He exhibits no distension. There is no tenderness. There is no rebound.    Musculoskeletal: Normal range of motion.  Neurological: He is alert and oriented to person, place, and time.  Skin: No erythema.  Inflamed patch axilla with satellite lesions and lower axillary area  Psychiatric: He has a normal mood and affect. His behavior is normal. Judgment and thought content normal.    Results for orders placed or performed in visit on 06/19/16  Basic metabolic panel  Result Value Ref Range   Glucose 85 65 - 99 mg/dL   BUN 16 8 - 27 mg/dL   Creatinine, Ser 4.091.00 0.76 - 1.27 mg/dL   GFR calc non Af Amer 78 >59 mL/min/1.73   GFR calc Af Amer 90 >59 mL/min/1.73   BUN/Creatinine Ratio 16 10 - 24   Sodium 143 134 - 144 mmol/L   Potassium 4.5 3.5 - 5.2 mmol/L   Chloride 106 96 - 106 mmol/L   CO2 20 18 - 29 mmol/L   Calcium 9.4 8.6 - 10.2 mg/dL      Assessment & Plan:   Problem List Items Addressed This Visit      Cardiovascular and Mediastinum   Hypertension    The current medical regimen is effective;  continue present plan and medications.         Musculoskeletal and Integument   Dermatitis    Discussed dermatitis will use Chlortrimazole and betamethasone to help treat both fungal  and inflammatory changes.        Other   Fatigue    Discuss fatigue and possible heat exposure developing heat exhaustion will check BMP and UA. Discuss fluids hydration watermelon etc.      Relevant Orders   Basic metabolic panel   Urinalysis, Routine w reflex microscopic       Follow up plan: Return for As scheduled.

## 2016-08-26 ENCOUNTER — Encounter: Payer: Self-pay | Admitting: Family Medicine

## 2016-08-26 LAB — BASIC METABOLIC PANEL
BUN/Creatinine Ratio: 16 (ref 10–24)
BUN: 15 mg/dL (ref 8–27)
CO2: 20 mmol/L (ref 20–29)
Calcium: 9.6 mg/dL (ref 8.6–10.2)
Chloride: 106 mmol/L (ref 96–106)
Creatinine, Ser: 0.92 mg/dL (ref 0.76–1.27)
GFR calc Af Amer: 100 mL/min/{1.73_m2} (ref >59)
GFR calc non Af Amer: 86 mL/min/{1.73_m2} (ref >59)
Glucose: 100 mg/dL — ABNORMAL HIGH (ref 65–99)
Potassium: 4.4 mmol/L (ref 3.5–5.2)
Sodium: 143 mmol/L (ref 134–144)

## 2016-08-26 LAB — URINALYSIS, ROUTINE W REFLEX MICROSCOPIC
Bilirubin, UA: NEGATIVE
Glucose, UA: NEGATIVE
Ketones, UA: NEGATIVE
Leukocytes, UA: NEGATIVE
Nitrite, UA: NEGATIVE
Protein, UA: NEGATIVE
RBC, UA: NEGATIVE
Specific Gravity, UA: 1.025 (ref 1.005–1.030)
Urobilinogen, Ur: 0.2 mg/dL (ref 0.2–1.0)
pH, UA: 6 (ref 5.0–7.5)

## 2016-08-26 LAB — MICROSCOPIC EXAMINATION: Bacteria, UA: NONE SEEN

## 2016-10-09 ENCOUNTER — Ambulatory Visit (INDEPENDENT_AMBULATORY_CARE_PROVIDER_SITE_OTHER): Payer: Medicare Other | Admitting: Family Medicine

## 2016-10-09 ENCOUNTER — Encounter: Payer: Self-pay | Admitting: Family Medicine

## 2016-10-09 VITALS — BP 148/84 | HR 69 | Temp 97.6°F | Wt 220.8 lb

## 2016-10-09 DIAGNOSIS — M545 Low back pain, unspecified: Secondary | ICD-10-CM

## 2016-10-09 MED ORDER — PREDNISONE 10 MG PO TABS: ORAL_TABLET | ORAL | 0 refills | Status: DC

## 2016-10-09 MED ORDER — CYCLOBENZAPRINE HCL 10 MG PO TABS: 10.0000 mg | ORAL_TABLET | Freq: Three times a day (TID) | ORAL | 0 refills | Status: DC | PRN

## 2016-10-09 NOTE — Progress Notes (Signed)
   BP (!) 148/84   Pulse 69   Temp 97.6 F (36.4 C)   Wt 220 lb 12.8 oz (100.2 kg)   SpO2 95%   BMI 31.91 kg/m    Subjective:    Patient ID: Thomas Miller, male    DOB: [birth year], 66 y.o.   MRN: [id number]  HPI: Thomas Miller is a 66 y.o. male  Chief Complaint  Patient presents with  . Back Pain    pt states he hurt his back on 09/22/16 when he tripped over a sidewalk, left side    Left hip/back pain, burning sensation posterior lateral hip x 2 weeks since tripping over a sidewalk. No radiation down leg. Denies numbness, tingling, incontinence, fevers, leg weakness. Using ice/heat, ibuprofen with minimal relief. Had same issue years ago and was given exercises at that time, has been doing those with no relief.   Relevant past medical, surgical, family and social history reviewed and updated as indicated. Interim medical history since our last visit reviewed. Allergies and medications reviewed and updated.  Review of Systems  Constitutional: Negative.   Respiratory: Negative.   Cardiovascular: Negative.   Gastrointestinal: Negative.   Genitourinary: Negative.   Musculoskeletal: Positive for back pain.  Neurological: Negative.   Psychiatric/Behavioral: Negative.    Per HPI unless specifically indicated above     Objective:    BP (!) 148/84   Pulse 69   Temp 97.6 F (36.4 C)   Wt 220 lb 12.8 oz (100.2 kg)   SpO2 95%   BMI 31.91 kg/m   Wt Readings from Last 3 Encounters:  10/09/16 220 lb 12.8 oz (100.2 kg)  08/25/16 215 lb (97.5 kg)  06/19/16 219 lb 6.4 oz (99.5 kg)    Physical Exam  Constitutional: He is oriented to person, place, and time. He appears well-developed and well-nourished.  HENT:  Head: Atraumatic.  Eyes: Pupils are equal, round, and reactive to light. Conjunctivae are normal. No scleral icterus.  Neck: Normal range of motion. Neck supple.  Cardiovascular: Normal rate and normal heart sounds.   Pulmonary/Chest: Effort normal and breath sounds  normal. No respiratory distress.  Musculoskeletal: Normal range of motion. He exhibits tenderness (TTP left lateral hip). He exhibits no deformity.  No spinal ttp - SLR   Neurological: He is alert and oriented to person, place, and time.  Skin: Skin is warm and dry.  Psychiatric: He has a normal mood and affect. His behavior is normal.  Nursing note and vitals reviewed.     Assessment & Plan:   Problem List Items Addressed This Visit    None    Visit Diagnoses    Acute left-sided low back pain without sciatica    -  Primary   Given burning quality of his pain, suspect some sciatic nerve irritation. Prednisone, flexeril, tylenol, epsom soaks, and stretches. F/u if no improvement   Relevant Medications   predniSONE (DELTASONE) 10 MG tablet   cyclobenzaprine (FLEXERIL) 10 MG tablet       Follow up plan: Return if symptoms worsen or fail to improve.

## 2016-10-10 NOTE — Patient Instructions (Addendum)

## 2016-10-23 ENCOUNTER — Telehealth: Payer: Self-pay | Admitting: Family Medicine

## 2016-10-23 ENCOUNTER — Ambulatory Visit
Admission: RE | Admit: 2016-10-23 | Discharge: 2016-10-23 | Disposition: A | Discharge status: Home / Self Care | Payer: Medicare Other | Source: Ambulatory Visit | Attending: Family Medicine | Admitting: Family Medicine

## 2016-10-23 ENCOUNTER — Ambulatory Visit (INDEPENDENT_AMBULATORY_CARE_PROVIDER_SITE_OTHER): Payer: Medicare Other

## 2016-10-23 ENCOUNTER — Ambulatory Visit: Payer: Medicare Other | Admitting: Family Medicine

## 2016-10-23 VITALS — BP 140/78 | HR 65 | Temp 98.3°F | Resp 16 | Ht 69.0 in | Wt 216.5 lb

## 2016-10-23 VITALS — BP 140/78 | HR 65 | Temp 98.3°F | Resp 16 | Ht 69.0 in | Wt 216.0 lb

## 2016-10-23 DIAGNOSIS — M5136 Other intervertebral disc degeneration, lumbar region: Secondary | ICD-10-CM | POA: Diagnosis not present

## 2016-10-23 DIAGNOSIS — M5442 Lumbago with sciatica, left side: Secondary | ICD-10-CM

## 2016-10-23 DIAGNOSIS — Z0000 Encounter for general adult medical examination without abnormal findings: Secondary | ICD-10-CM | POA: Diagnosis not present

## 2016-10-23 DIAGNOSIS — M545 Low back pain: Secondary | ICD-10-CM | POA: Diagnosis not present

## 2016-10-23 MED ORDER — GABAPENTIN 300 MG PO CAPS: 300.0000 mg | ORAL_CAPSULE | Freq: Three times a day (TID) | ORAL | 1 refills | Status: DC

## 2016-10-23 NOTE — Telephone Encounter (Signed)
LVM for patient to return phone call.  

## 2016-10-23 NOTE — Progress Notes (Signed)
   BP 140/78 (BP Location: Left Arm, Patient Position: Sitting)   Pulse 65   Temp 98.3 F (36.8 C) (Oral)   Resp 16   Ht  (1.753 m)   Wt 216 lb (98 kg)   BMI 31.90 kg/m    Subjective:    Patient ID: Thomas Miller, male    DOB: [birth year], 66 y.o.   MRN: [id number]  HPI: Thomas Miller is a 66 y.o. male  Chief Complaint  Patient presents with  . Back Pain  Persisting left low back radiating down leg to knee. Prednisone, flexeril not seeming to help at all. Still doing the PT stretches. Still walking 2 or so miles daily, though he's used to walking 6 or more daily. Pt states this issue is significantly impacting his daily life.   Relevant past medical, surgical, family and social history reviewed and updated as indicated. Interim medical history since our last visit reviewed. Allergies and medications reviewed and updated.  Review of Systems  Constitutional: Negative.   Respiratory: Negative.   Cardiovascular: Negative.   Genitourinary: Negative.   Musculoskeletal: Positive for back pain and gait problem.  Psychiatric/Behavioral: Negative.    Per HPI unless specifically indicated above     Objective:    BP 140/78 (BP Location: Left Arm, Patient Position: Sitting)   Pulse 65   Temp 98.3 F (36.8 C) (Oral)   Resp 16   Ht  (1.753 m)   Wt 216 lb (98 kg)   BMI 31.90 kg/m   Wt Readings from Last 3 Encounters:  10/23/16 216 lb (98 kg)  10/23/16 216 lb 8 oz (98.2 kg)  10/09/16 220 lb 12.8 oz (100.2 kg)    Physical Exam  Constitutional: He is oriented to person, place, and time. He appears well-developed and well-nourished.  HENT:  Head: Atraumatic.  Eyes: Pupils are equal, round, and reactive to light. Conjunctivae are normal.  Neck: Normal range of motion. Neck supple.  Cardiovascular: Normal rate and normal heart sounds.   Pulmonary/Chest: Effort normal and breath sounds normal. No respiratory distress.  Musculoskeletal: Normal range of motion. He exhibits  no edema or tenderness.  - SLR  Neurological: He is alert and oriented to person, place, and time.  Skin: Skin is warm and dry.  Psychiatric: He has a normal mood and affect. His behavior is normal.  Nursing note and vitals reviewed.     Assessment & Plan:   Problem List Items Addressed This Visit    None    Visit Diagnoses    Acute left-sided low back pain with left-sided sciatica    -  Primary   Will get x-ray and refer to orthopedics given lack of response to prednisone, NSAIDs, or muscle relaxers. Continue stretches, rest, meloxicam prn   Relevant Orders   DG Lumbar Spine Complete (Completed)       Follow up plan: Return for as scheduled.

## 2016-10-23 NOTE — Telephone Encounter (Signed)
Let him know his x-ray showed some degenerative changes in his spine but nothing in the area of his pain. I think it would be best to send him to orthopedics for further eval since he didn't improve with medications

## 2016-10-23 NOTE — Patient Instructions (Addendum)
Thomas Miller , Thank you for taking time to come for your Medicare Wellness Visit. I appreciate your ongoing commitment to your health goals. Please review the following plan we discussed and let me know if I can assist you in the future.   Screening recommendations/referrals: Colonoscopy: cologuard done 12/10/2015 Recommended yearly ophthalmology/optometry visit for glaucoma screening and checkup Recommended yearly dental visit for hygiene and checkup  Vaccinations: Influenza vaccine: due now-declined Pneumococcal vaccine: pneumovax 23 due 11/19/2016 Tdap vaccine: up to date Shingles vaccine: up to date  Advanced directives: Please bring a copy of your health care power of attorney and living will to the office at your convenience.  Conditions/risks identified: none  Next appointment: Follow up in one year for your annual wellness exam.   Preventive Care 65 Years and Older, Male Preventive care refers to lifestyle choices and visits with your health care provider that can promote health and wellness. What does preventive care include?  A yearly physical exam. This is also called an annual well check.  Dental exams once or twice a year.  Routine eye exams. Ask your health care provider how often you should have your eyes checked.  Personal lifestyle choices, including:  Daily care of your teeth and gums.  Regular physical activity.  Eating a healthy diet.  Avoiding tobacco and drug use.  Limiting alcohol use.  Practicing safe sex.  Taking low doses of aspirin every day.  Taking vitamin and mineral supplements as recommended by your health care provider. What happens during an annual well check? The services and screenings done by your health care provider during your annual well check will depend on your age, overall health, lifestyle risk factors, and family history of disease. Counseling  Your health care provider may ask you questions about your:  Alcohol  use.  Tobacco use.  Drug use.  Emotional well-being.  Home and relationship well-being.  Sexual activity.  Eating habits.  History of falls.  Memory and ability to understand (cognition).  Work and work Astronomer. Screening  You may have the following tests or measurements:  Height, weight, and BMI.  Blood pressure.  Lipid and cholesterol levels. These may be checked every 5 years, or more frequently if you are over 59 years old.  Skin check.  Lung cancer screening. You may have this screening every year starting at age 22 if you have a 30-pack-year history of smoking and currently smoke or have quit within the past 15 years.  Fecal occult blood test (FOBT) of the stool. You may have this test every year starting at age 1.  Flexible sigmoidoscopy or colonoscopy. You may have a sigmoidoscopy every 5 years or a colonoscopy every 10 years starting at age 32.  Prostate cancer screening. Recommendations will vary depending on your family history and other risks.  Hepatitis C blood test.  Hepatitis B blood test.  Sexually transmitted disease (STD) testing.  Diabetes screening. This is done by checking your blood sugar (glucose) after you have not eaten for a while (fasting). You may have this done every 1-3 years.  Abdominal aortic aneurysm (AAA) screening. You may need this if you are a current or former smoker.  Osteoporosis. You may be screened starting at age 44 if you are at high risk. Talk with your health care provider about your test results, treatment options, and if necessary, the need for more tests. Vaccines  Your health care provider may recommend certain vaccines, such as:  Influenza vaccine. This is recommended every  year.  Tetanus, diphtheria, and acellular pertussis (Tdap, Td) vaccine. You may need a Td booster every 10 years.  Zoster vaccine. You may need this after age 42.  Pneumococcal 13-valent conjugate (PCV13) vaccine. One dose is  recommended after age 19.  Pneumococcal polysaccharide (PPSV23) vaccine. One dose is recommended after age 72. Talk to your health care provider about which screenings and vaccines you need and how often you need them. This information is not intended to replace advice given to you by your health care provider. Make sure you discuss any questions you have with your health care provider. Document Released: 02/09/2015 Document Revised: 10/03/2015 Document Reviewed: 11/14/2014 Elsevier Interactive Patient Education  2017 [employer] Prevention in the Home Falls can cause injuries. They can happen to people of all ages. There are many things you can do to make your home safe and to help prevent falls. What can I do on the outside of my home?  Regularly fix the edges of walkways and driveways and fix any cracks.  Remove anything that might make you trip as you walk through a door, such as a raised step or threshold.  Trim any bushes or trees on the path to your home.  Use bright outdoor lighting.  Clear any walking paths of anything that might make someone trip, such as rocks or tools.  Regularly check to see if handrails are loose or broken. Make sure that both sides of any steps have handrails.  Any raised decks and porches should have guardrails on the edges.  Have any leaves, snow, or ice cleared regularly.  Use sand or salt on walking paths during winter.  Clean up any spills in your garage right away. This includes oil or grease spills. What can I do in the bathroom?  Use night lights.  Install grab bars by the toilet and in the tub and shower. Do not use towel bars as grab bars.  Use non-skid mats or decals in the tub or shower.  If you need to sit down in the shower, use a plastic, non-slip stool.  Keep the floor dry. Clean up any water that spills on the floor as soon as it happens.  Remove soap buildup in the tub or shower regularly.  Attach bath mats  securely with double-sided non-slip rug tape.  Do not have throw rugs and other things on the floor that can make you trip. What can I do in the bedroom?  Use night lights.  Make sure that you have a light by your bed that is easy to reach.  Do not use any sheets or blankets that are too big for your bed. They should not hang down onto the floor.  Have a firm chair that has side arms. You can use this for support while you get dressed.  Do not have throw rugs and other things on the floor that can make you trip. What can I do in the kitchen?  Clean up any spills right away.  Avoid walking on wet floors.  Keep items that you use a lot in easy-to-reach places.  If you need to reach something above you, use a strong step stool that has a grab bar.  Keep electrical cords out of the way.  Do not use floor polish or wax that makes floors slippery. If you must use wax, use non-skid floor wax.  Do not have throw rugs and other things on the floor that can make you trip. What can  I do with my stairs?  Do not leave any items on the stairs.  Make sure that there are handrails on both sides of the stairs and use them. Fix handrails that are broken or loose. Make sure that handrails are as long as the stairways.  Check any carpeting to make sure that it is firmly attached to the stairs. Fix any carpet that is loose or worn.  Avoid having throw rugs at the top or bottom of the stairs. If you do have throw rugs, attach them to the floor with carpet tape.  Make sure that you have a light switch at the top of the stairs and the bottom of the stairs. If you do not have them, ask someone to add them for you. What else can I do to help prevent falls?  Wear shoes that:  Do not have high heels.  Have rubber bottoms.  Are comfortable and fit you well.  Are closed at the toe. Do not wear sandals.  If you use a stepladder:  Make sure that it is fully opened. Do not climb a closed  stepladder.  Make sure that both sides of the stepladder are locked into place.  Ask someone to hold it for you, if possible.  Clearly mark and make sure that you can see:  Any grab bars or handrails.  First and last steps.  Where the edge of each step is.  Use tools that help you move around (mobility aids) if they are needed. These include:  Canes.  Walkers.  Scooters.  Crutches.  Turn on the lights when you go into a dark area. Replace any light bulbs as soon as they burn out.  Set up your furniture so you have a clear path. Avoid moving your furniture around.  If any of your floors are uneven, fix them.  If there are any pets around you, be aware of where they are.  Review your medicines with your doctor. Some medicines can make you feel dizzy. This can increase your chance of falling. Ask your doctor what other things that you can do to help prevent falls. This information is not intended to replace advice given to you by your health care provider. Make sure you discuss any questions you have with your health care provider. Document Released: 11/09/2008 Document Revised: 06/21/2015 Document Reviewed: 02/17/2014 Elsevier Interactive Patient Education  2017 [employer].

## 2016-10-23 NOTE — Telephone Encounter (Signed)
Patient notified. Patient agreed to referral.

## 2016-10-23 NOTE — Telephone Encounter (Signed)
Referral generated

## 2016-10-23 NOTE — Progress Notes (Signed)
Subjective:   Thomas Miller is a 66 y.o. male who presents for Medicare Annual/Subsequent preventive examination.  Review of Systems:   Cardiac Risk Factors include: male gender;advanced age (>71men, >25 women);obesity (BMI >30kg/m2);hypertension     Objective:    Vitals: BP 140/78 (BP Location: Left Arm, Patient Position: Sitting)   Pulse 65   Temp 98.3 F (36.8 C) (Oral)   Resp 16   Ht  (1.753 m)   Wt 216 lb 8 oz (98.2 kg)   BMI 31.97 kg/m   Body mass index is 31.97 kg/m.  Tobacco History  Smoking Status  . Never Smoker  Smokeless Tobacco  . Never Used     Counseling given: Not Answered   Past Medical History:  Diagnosis Date  . Gout   . Hearing loss   . Hypertension   . Reflux    Past Surgical History:  Procedure Laterality Date  . EYE SURGERY Bilateral 2013  . FOOT SURGERY Bilateral    ingrown toenails  . GALLBLADDER SURGERY    . SHOULDER SURGERY Right    Family History  Problem Relation Age of Onset  . Diabetes Mother    History  Sexual Activity  . Sexual activity: Not on file    Outpatient Encounter Prescriptions as of 10/23/2016  Medication Sig  . allopurinol (ZYLOPRIM) 300 MG tablet Take 1 tablet (300 mg total) by mouth daily.  [name] atorvastatin (LIPITOR) 20 MG tablet Take 1 tablet (20 mg total) by mouth daily at 6 PM.  . clotrimazole-betamethasone (LOTRISONE) cream Apply 1 application topically 2 (two) times daily.  [name] esomeprazole (NEXIUM) 40 MG capsule Take 1 capsule (40 mg total) by mouth daily before breakfast.  . meloxicam (MOBIC) 15 MG tablet Take 1 tablet (15 mg total) by mouth daily.  . valsartan (DIOVAN) 160 MG tablet Take 1 tablet (160 mg total) by mouth daily.  . cyclobenzaprine (FLEXERIL) 10 MG tablet Take 1 tablet (10 mg total) by mouth 3 (three) times daily as needed for muscle spasms. (Patient not taking: Reported on 10/23/2016)  . [DISCONTINUED] predniSONE (DELTASONE) 10 MG tablet Take 6 tabs day one, then 5 tabs day two, 4  tabs day three, etc (Patient not taking: Reported on 10/23/2016)   No facility-administered encounter medications on file as of 10/23/2016.     Activities of Daily Living In your present state of health, do you have any difficulty performing the following activities: 10/23/2016 11/19/2015  Hearing? Malvin Johns  Vision? N N  Difficulty concentrating or making decisions? N N  Walking or climbing stairs? Y N  Comment hip/back pain -  Dressing or bathing? N N  Doing errands, shopping? N N  Preparing Food and eating ? N -  Using the Toilet? N -  In the past six months, have you accidently leaked urine? N -  Do you have problems with loss of bowel control? N -  Managing your Medications? N -  Managing your Finances? N -  Housekeeping or managing your Housekeeping? N -  Some recent data might be hidden    Patient Care Team: Steele Sizer, MD as PCP - General (Family Medicine)   Assessment:     Exercise Activities and Dietary recommendations Current Exercise Habits: Home exercise routine, Type of exercise: stretching, Time (Minutes): 30, Frequency (Times/Week): 7, Weekly Exercise (Minutes/Week): 210, Intensity: Mild, Exercise limited by: orthopedic condition(s) (back pain )  Goals    None     Fall Risk Fall Risk  10/23/2016 08/25/2016 06/19/2016 05/19/2016 11/19/2015  Falls in the past year? No No No No No   Depression Screen PHQ 2/9 Scores 10/23/2016 08/25/2016 11/19/2015 10/25/2014  PHQ - 2 Score 0 0 0 0    Cognitive Function     6CIT Screen 10/23/2016  What Year? 0 points  What month? 0 points  What time? 0 points  Count back from 20 0 points  Months in reverse 0 points  Repeat phrase 0 points  Total Score 0    Immunization History  Administered Date(s) Administered  . Pneumococcal Conjugate-13 11/19/2015  . Td 11/10/2007  . Zoster 02/17/2009   Screening Tests Health Maintenance  Topic Date Due  . INFLUENZA VACCINE  09/27/2017 (Originally 08/27/2016)  . PNA vac Low Risk  Adult (2 of 2 - PPSV23) 11/18/2016  . TETANUS/TDAP  11/09/2017  . Fecal DNA (Cologuard)  12/10/2018  . Hepatitis C Screening  Completed      Plan:    I have personally reviewed and addressed the Medicare Annual Wellness questionnaire and have noted the following in the patient's chart:  A. Medical and social history B. Use of alcohol, tobacco or illicit drugs  C. Current medications and supplements D. Functional ability and status E.  Nutritional status F.  Physical activity G. Advance directives H. List of other physicians I.  Hospitalizations, surgeries, and ER visits in previous 12 months J.  Vitals K. Screenings such as hearing and vision if needed, cognitive and depression L. Referrals and appointments   In addition, I have reviewed and discussed with patient certain preventive protocols, quality metrics, and best practice recommendations. A written personalized care plan for preventive services as well as general preventive health recommendations were provided to patient.   Signed,  Marin Roberts, LPN Nurse Health Advisor   MD Recommendations: none

## 2016-10-24 ENCOUNTER — Encounter: Payer: Self-pay | Admitting: Family Medicine

## 2016-10-24 NOTE — Patient Instructions (Signed)
Follow up as needed

## 2016-10-27 ENCOUNTER — Other Ambulatory Visit: Payer: Self-pay | Admitting: Family Medicine

## 2016-11-03 ENCOUNTER — Encounter (INDEPENDENT_AMBULATORY_CARE_PROVIDER_SITE_OTHER): Payer: Self-pay | Admitting: Orthopedic Surgery

## 2016-11-03 ENCOUNTER — Ambulatory Visit (INDEPENDENT_AMBULATORY_CARE_PROVIDER_SITE_OTHER): Payer: PRIVATE HEALTH INSURANCE | Admitting: Orthopedic Surgery

## 2016-11-03 DIAGNOSIS — M5416 Radiculopathy, lumbar region: Secondary | ICD-10-CM | POA: Diagnosis not present

## 2016-11-03 DIAGNOSIS — M5442 Lumbago with sciatica, left side: Secondary | ICD-10-CM | POA: Diagnosis not present

## 2016-11-03 MED ORDER — TRAMADOL HCL 50 MG PO TABS: 50.0000 mg | ORAL_TABLET | Freq: Four times a day (QID) | ORAL | 0 refills | Status: DC | PRN

## 2016-11-03 NOTE — Progress Notes (Signed)
Office Visit Note   Patient: Thomas Miller           Date of Birth: 08-03-1950           MRN: [id number] Visit Date: 11/03/2016              Requested by: Particia Nearing, PA-C [address] [place], [place] [address] PCP: Steele Sizer, MD  Chief Complaint  Patient presents with  . Lower Back - Pain      HPI: The patient is a 66 year old gentleman with a complaining of low back pain with radicular symptoms, shooting pain, down the left this is in his lateral hip, to posteior thigh has gradually been worsening and extending further distally since a fall on 09/22/16. Bilateral low back pain. Denies weakness or numbness/tingling.   Has tried flexeril and a pred taper with no relief. States ASA only thing helping right now, is taking this daily.  Has had radiographs of lumbar spine, are in cone system. Mild degenerative changes. No acute finding.     Assessment & Plan: Visit Diagnoses:  1. Acute bilateral low back pain with left-sided sciatica   2. Radiculopathy, lumbar region     Plan: will refer to physiatry for [facility] lumbar spine. If no relief will consider MRI of lumbar spine. Patient in agreement with plan. Provided Tramadol Rx today. Will call with any concerns.   Follow-Up Instructions: Return if symptoms worsen or fail to improve.   Back Exam   Tenderness  The patient is experiencing no tenderness.   Range of Motion  The patient has normal back ROM.  Muscle Strength  The patient has normal back strength.  Tests  Straight leg raise right: negative Straight leg raise left: negative      Patient is alert, oriented, no adenopathy, well-dressed, normal affect, normal respiratory effort.   Imaging: No results found. No images are attached to the encounter.  Labs: Lab Results  Component Value Date   LABURIC 4.8 05/19/2016   LABURIC 4.9 11/19/2015    Orders:  Orders Placed This Encounter  Procedures  . Ambulatory referral to Physical Medicine  Rehab   Meds ordered this encounter  Medications  . traMADol (ULTRAM) 50 MG tablet    Sig: Take 1 tablet (50 mg total) by mouth every 6 (six) hours as needed.    Dispense:  30 tablet    Refill:  0     Procedures: No procedures performed  Clinical Data: No additional findings.  ROS:  All other systems negative, except as noted in the HPI. Review of Systems  Constitutional: Negative for chills and fever.  Musculoskeletal: Positive for back pain.  Neurological: Negative for weakness and numbness.    Objective: Vital Signs: There were no vitals taken for this visit.  Specialty Comments:  No specialty comments available.  PMFS History: Patient Active Problem List   Diagnosis Date Noted  . Dermatitis 08/25/2016  . Fatigue 08/25/2016  . Insomnia 05/19/2016  . Hypertension 10/25/2014  . Gout 10/25/2014  . GERD (gastroesophageal reflux disease) 10/25/2014  . Hyperlipemia 10/25/2014  . BPH (benign prostatic hyperplasia) 10/25/2014   Past Medical History:  Diagnosis Date  . Gout   . Hearing loss   . Hypertension   . Reflux     Family History  Problem Relation Age of Onset  . Diabetes Mother     Past Surgical History:  Procedure Laterality Date  . EYE SURGERY Bilateral 2013  . FOOT SURGERY Bilateral  ingrown toenails  . GALLBLADDER SURGERY    . SHOULDER SURGERY Right    Social History   Occupational History  . Not on file.   Social History Main Topics  . Smoking status: Never Smoker  . Smokeless tobacco: Never Used  . Alcohol use Yes     Comment: rarely  . Drug use: No  . Sexual activity: Not on file

## 2016-11-07 ENCOUNTER — Other Ambulatory Visit (INDEPENDENT_AMBULATORY_CARE_PROVIDER_SITE_OTHER): Payer: Self-pay | Admitting: Family

## 2016-11-07 DIAGNOSIS — M5416 Radiculopathy, lumbar region: Secondary | ICD-10-CM

## 2016-11-10 ENCOUNTER — Telehealth (INDEPENDENT_AMBULATORY_CARE_PROVIDER_SITE_OTHER): Payer: Self-pay | Admitting: Family

## 2016-11-10 NOTE — Telephone Encounter (Signed)
Jenel Lucks from [facility] called stating that she received an order for an injection, but in order for them to have an injection, the patient must have a previous MRI or CT.  The patient does not have either.  CB#[phone].  Thank you.

## 2016-11-10 NOTE — Telephone Encounter (Deleted)
Patient just found out he has cancer, also has history of kidney disease. Advised he needs clearance before his scan can be scheduled. Will hold for tomorrow.

## 2016-11-12 NOTE — Telephone Encounter (Signed)
Denny Peonrin was wanting to see if Dr. Alvester MorinNewton could do patients ESI, without MRI since [facility] could not she only referred out because Dr. Alvester MorinNewton had a full schedule.

## 2016-11-12 NOTE — Telephone Encounter (Signed)
Please advise 

## 2016-11-13 NOTE — Telephone Encounter (Signed)
Patient is scheduled with Dr. Alvester MorinNewton on 10/24 and is on the wait list for cancellations.

## 2016-11-13 NOTE — Telephone Encounter (Signed)
Left two level trans first avail - likely L4 and L5.  Ok without MRI. They should [facility] has ben booked out further than me?

## 2016-11-19 ENCOUNTER — Other Ambulatory Visit: Payer: Self-pay | Admitting: Family Medicine

## 2016-11-19 ENCOUNTER — Ambulatory Visit (INDEPENDENT_AMBULATORY_CARE_PROVIDER_SITE_OTHER): Payer: Medicare Other | Admitting: Physical Medicine and Rehabilitation

## 2016-11-19 ENCOUNTER — Ambulatory Visit (INDEPENDENT_AMBULATORY_CARE_PROVIDER_SITE_OTHER): Payer: Medicare Other

## 2016-11-19 ENCOUNTER — Encounter (INDEPENDENT_AMBULATORY_CARE_PROVIDER_SITE_OTHER): Payer: Self-pay | Admitting: Physical Medicine and Rehabilitation

## 2016-11-19 VITALS — BP 149/93 | HR 57

## 2016-11-19 DIAGNOSIS — E7800 Pure hypercholesterolemia, unspecified: Secondary | ICD-10-CM

## 2016-11-19 DIAGNOSIS — M5416 Radiculopathy, lumbar region: Secondary | ICD-10-CM

## 2016-11-19 DIAGNOSIS — M1000 Idiopathic gout, unspecified site: Secondary | ICD-10-CM

## 2016-11-19 MED ORDER — BETAMETHASONE SOD PHOS & ACET 6 (3-3) MG/ML IJ SUSP
12.0000 mg | Freq: Once | INTRAMUSCULAR | Status: AC
  Administered 2016-11-19: 12 mg

## 2016-11-19 MED ORDER — LIDOCAINE HCL (PF) 1 % IJ SOLN
2.0000 mL | Freq: Once | INTRAMUSCULAR | Status: AC
  Administered 2016-11-19: 2 mL

## 2016-11-19 NOTE — Procedures (Signed)
Thomas Miller is a 66 year old gentleman chronic worsening severe and recent flareup of radicular pain in the left buttock and leg more of an L5 and S1 distribution to the knee.  Is worse with walking and standing.  Some pain with sitting but sitting seems to be better.  He has recently been evaluated by Dr. due to who suggested diagnostic epidural injection.  Patient did have an epidural injection in the past with good relief.  We discussed that with no red flag symptoms it would be wise to go ahead and complete the injection to see if we get him a lot of relief.  He does like to play golf.  If he does not get much relief first very short-lived would look at updated MRI spine.  Lumbosacral Transforaminal Epidural Steroid Injection - Sub-Pedicular Approach with Fluoroscopic Guidance  Patient: Thomas Miller      Date of Birth: October 27, 1950 MRN: [id number] PCP: Steele Sizerrissman, Mark A, MD      Visit Date: 11/19/2016   Universal Protocol:    Date/Time: 11/19/2016  Consent Given By: the patient  Position: PRONE  Additional Comments: Vital signs were monitored before and after the procedure. Patient was prepped and draped in the usual sterile fashion. The correct patient, procedure, and site was verified.   Injection Procedure Details:  Procedure Site One Meds Administered:  Meds ordered this encounter  Medications  . lidocaine (PF) (XYLOCAINE) 1 % injection 2 mL  . betamethasone acetate-betamethasone sodium phosphate (CELESTONE) injection 12 mg    Laterality: Left  Location/Site:  L5-S1 S1-2  Needle size: 22 G  Needle type: Spinal  Needle Placement: Transforaminal  Findings:  -Contrast Used: 1 mL iohexol 180 mg iodine/mL   -Comments: Excellent flow of contrast along the nerve and into the epidural space.  Procedure Details: After squaring off the end-plates to get a true AP view, the C-arm was positioned so that an oblique view of the foramen as noted above was visualized. The target  area is just inferior to the "nose of the scotty dog" or sub pedicular. The soft tissues overlying this structure were infiltrated with 2-3 ml. of 1% Lidocaine without Epinephrine.  The spinal needle was inserted toward the target using a "trajectory" view along the fluoroscope beam.  Under AP and lateral visualization, the needle was advanced so it did not puncture dura and was located close the 6 O'Clock position of the pedical in AP tracterory. Biplanar projections were used to confirm position. Aspiration was confirmed to be negative for CSF and/or blood. A 1-2 ml. volume of Isovue-250 was injected and flow of contrast was noted at each level. Radiographs were obtained for documentation purposes.   After attaining the desired flow of contrast documented above, a 0.5 to 1.0 ml test dose of 0.25% Marcaine was injected into each respective transforaminal space.  The patient was observed for 90 seconds post injection.  After no sensory deficits were reported, and normal lower extremity motor function was noted,   the above injectate was administered so that equal amounts of the injectate were placed at each foramen (level) into the transforaminal epidural space.   Additional Comments:  The patient tolerated the procedure well Dressing: Band-Aid    Post-procedure details: Patient was observed during the procedure. Post-procedure instructions were reviewed.  Patient left the clinic in stable condition.

## 2016-11-19 NOTE — Patient Instructions (Signed)

## 2016-11-19 NOTE — Progress Notes (Deleted)
Left L5 and S1 TF, August tripped over dog while walking then played round of golf. S1 to knee. Worse walking and standing. Some sitting

## 2016-11-26 ENCOUNTER — Encounter: Payer: Medicare Other | Admitting: Family Medicine

## 2016-11-27 ENCOUNTER — Encounter: Payer: Self-pay | Admitting: Family Medicine

## 2016-11-27 ENCOUNTER — Ambulatory Visit (INDEPENDENT_AMBULATORY_CARE_PROVIDER_SITE_OTHER): Payer: Medicare Other | Admitting: Family Medicine

## 2016-11-27 VITALS — BP 156/95 | HR 71 | Temp 98.5°F | Ht 70.0 in | Wt 217.0 lb

## 2016-11-27 DIAGNOSIS — M1000 Idiopathic gout, unspecified site: Secondary | ICD-10-CM | POA: Diagnosis not present

## 2016-11-27 DIAGNOSIS — K219 Gastro-esophageal reflux disease without esophagitis: Secondary | ICD-10-CM | POA: Diagnosis not present

## 2016-11-27 DIAGNOSIS — N400 Enlarged prostate without lower urinary tract symptoms: Secondary | ICD-10-CM

## 2016-11-27 DIAGNOSIS — Z0000 Encounter for general adult medical examination without abnormal findings: Secondary | ICD-10-CM | POA: Diagnosis not present

## 2016-11-27 DIAGNOSIS — Z7189 Other specified counseling: Secondary | ICD-10-CM | POA: Diagnosis not present

## 2016-11-27 DIAGNOSIS — E7800 Pure hypercholesterolemia, unspecified: Secondary | ICD-10-CM | POA: Diagnosis not present

## 2016-11-27 DIAGNOSIS — I10 Essential (primary) hypertension: Secondary | ICD-10-CM

## 2016-11-27 DIAGNOSIS — M5416 Radiculopathy, lumbar region: Secondary | ICD-10-CM

## 2016-11-27 HISTORY — DX: Other specified counseling: Z71.89

## 2016-11-27 MED ORDER — ALLOPURINOL 300 MG PO TABS: 300.0000 mg | ORAL_TABLET | Freq: Every day | ORAL | 4 refills | Status: DC

## 2016-11-27 MED ORDER — ATORVASTATIN CALCIUM 20 MG PO TABS: 20.0000 mg | ORAL_TABLET | Freq: Every day | ORAL | 4 refills | Status: DC

## 2016-11-27 MED ORDER — ESOMEPRAZOLE MAGNESIUM 40 MG PO CPDR: 40.0000 mg | DELAYED_RELEASE_CAPSULE | Freq: Every day | ORAL | 4 refills | Status: DC

## 2016-11-27 MED ORDER — GABAPENTIN 300 MG PO CAPS: 300.0000 mg | ORAL_CAPSULE | Freq: Three times a day (TID) | ORAL | 4 refills | Status: DC

## 2016-11-27 MED ORDER — MELOXICAM 15 MG PO TABS: 15.0000 mg | ORAL_TABLET | Freq: Every day | ORAL | 3 refills | Status: DC

## 2016-11-27 MED ORDER — VALSARTAN 160 MG PO TABS: 160.0000 mg | ORAL_TABLET | Freq: Every day | ORAL | 4 refills | Status: DC

## 2016-11-27 NOTE — Assessment & Plan Note (Signed)
The current medical regimen is effective;  continue present plan and medications.  

## 2016-11-27 NOTE — Progress Notes (Signed)
BP (!) 156/95   Pulse 71   Temp 98.5 F (36.9 C) (Tympanic)   Ht 5\' 10"  (1.778 m)   Wt 217 lb (98.4 kg)   SpO2 98%   BMI 31.14 kg/m    Subjective:    Patient ID: Thomas Miller, male    DOB: 03/23/1950, 66 y.o.   MRN: [id number]  HPI: Thomas Miller is a 66 y.o. male  Chief Complaint  Patient presents with  . Annual Exam  Patient with back pain with radicular symptoms ever since August had injections last week and still feel a little lightheaded and not completely with it since then. Back maybe a little bit better. Other medications stable with no issues with gout cholesterol. Reflux doing okay Blood pressure also doing okay.  Relevant past medical, surgical, family and social history reviewed and updated as indicated. Interim medical history since our last visit reviewed. Allergies and medications reviewed and updated.  Review of Systems  Constitutional: Negative.   HENT: Negative.   Eyes: Negative.   Respiratory: Negative.   Cardiovascular: Negative.   Gastrointestinal: Negative.   Endocrine: Negative.   Genitourinary: Negative.   Musculoskeletal: Negative.   Skin: Negative.   Allergic/Immunologic: Negative.   Neurological: Negative.   Hematological: Negative.   Psychiatric/Behavioral: Negative.     Per HPI unless specifically indicated above     Objective:    BP (!) 156/95   Pulse 71   Temp 98.5 F (36.9 C) (Tympanic)   Ht 5\' 10"  (1.778 m)   Wt 217 lb (98.4 kg)   SpO2 98%   BMI 31.14 kg/m   Wt Readings from Last 3 Encounters:  11/27/16 217 lb (98.4 kg)  10/23/16 216 lb (98 kg)  10/23/16 216 lb 8 oz (98.2 kg)    Physical Exam  Constitutional: He is oriented to person, place, and time. He appears well-developed and well-nourished.  HENT:  Head: Normocephalic and atraumatic.  Right Ear: External ear normal.  Left Ear: External ear normal.  Eyes: Pupils are equal, round, and reactive to light. Conjunctivae and EOM are normal.  Neck: Normal range of  motion. Neck supple.  Cardiovascular: Normal rate, regular rhythm, normal heart sounds and intact distal pulses.   Pulmonary/Chest: Effort normal and breath sounds normal.  Abdominal: Soft. Bowel sounds are normal. There is no splenomegaly or hepatomegaly.  Genitourinary: Rectum normal and penis normal.  Genitourinary Comments: BPH changes  Musculoskeletal: Normal range of motion.  Neurological: He is alert and oriented to person, place, and time. He has normal reflexes.  Skin: No rash noted. No erythema.  Psychiatric: He has a normal mood and affect. His behavior is normal. Judgment and thought content normal.    Results for orders placed or performed in visit on 08/25/16  Microscopic Examination  Result Value Ref Range   WBC, UA 0-5 0 - 5 /hpf   RBC, UA 0-2 0 - 2 /hpf   Epithelial Cells (non renal) 0-10 0 - 10 /hpf   Bacteria, UA None seen None seen/Few  Basic metabolic panel  Result Value Ref Range   Glucose 100 (H) 65 - 99 mg/dL   BUN 15 8 - 27 mg/dL   Creatinine, Ser 7.820.92 0.76 - 1.27 mg/dL   GFR calc non Af Amer 86 >59 mL/min/1.73   GFR calc Af Amer 100 >59 mL/min/1.73   BUN/Creatinine Ratio 16 10 - 24   Sodium 143 134 - 144 mmol/L   Potassium 4.4 3.5 - 5.2 mmol/L  Chloride 106 96 - 106 mmol/L   CO2 20 20 - 29 mmol/L   Calcium 9.6 8.6 - 10.2 mg/dL  Urinalysis, Routine w reflex microscopic  Result Value Ref Range   Specific Gravity, UA 1.025 1.005 - 1.030   pH, UA 6.0 5.0 - 7.5   Color, UA Yellow Yellow   Appearance Ur Clear Clear   Leukocytes, UA Negative Negative   Protein, UA Negative Negative/Trace   Glucose, UA Negative Negative   Ketones, UA Negative Negative   RBC, UA Negative Negative   Bilirubin, UA Negative Negative   Urobilinogen, Ur 0.2 0.2 - 1.0 mg/dL   Nitrite, UA Negative Negative   Microscopic Examination See below:       Assessment & Plan:   Problem List Items Addressed This Visit      Cardiovascular and Mediastinum   Hypertension    The  current medical regimen is effective;  continue present plan and medications. Blood pressure elevated today but status post epidural with still some residual effects.       Relevant Medications   atorvastatin (LIPITOR) 20 MG tablet   valsartan (DIOVAN) 160 MG tablet   Other Relevant Orders   Comprehensive metabolic panel   CBC with Differential/Platelet   TSH     Digestive   GERD (gastroesophageal reflux disease)   Relevant Medications   esomeprazole (NEXIUM) 40 MG capsule   Other Relevant Orders   Comprehensive metabolic panel   CBC with Differential/Platelet   TSH     Nervous and Auditory   Lumbar radiculopathy    Continued lumbar symptoms status post epidural injection. Will observe for now has follow-up with physiatry.      Relevant Medications   meloxicam (MOBIC) 15 MG tablet   Other Relevant Orders   TSH     Genitourinary   BPH (benign prostatic hyperplasia)    The current medical regimen is effective;  continue present plan and medications.       Relevant Orders   Urinalysis, Routine w reflex microscopic   PSA     Other   Gout    The current medical regimen is effective;  continue present plan and medications.       Relevant Medications   allopurinol (ZYLOPRIM) 300 MG tablet   Other Relevant Orders   Comprehensive metabolic panel   TSH   Uric acid   Hyperlipemia    The current medical regimen is effective;  continue present plan and medications.       Relevant Medications   atorvastatin (LIPITOR) 20 MG tablet   valsartan (DIOVAN) 160 MG tablet   Other Relevant Orders   Comprehensive metabolic panel   Lipid panel   TSH   Advanced care planning/counseling discussion    A voluntary discussion about advance care planning including the explanation and discussion of advance directives was extensively discussed  with the patient.  Explanation about the health care proxy and Living will was reviewed and packet with forms with explanation of how to  fill them out was given.         Other Visit Diagnoses    PE (physical exam), annual    -  Primary       Follow up plan: Return in about 6 months (around 05/27/2017) for BMP,  Lipids, ALT, AST.

## 2016-11-27 NOTE — Assessment & Plan Note (Signed)
Continued lumbar symptoms status post epidural injection. Will observe for now has follow-up with physiatry.

## 2016-11-27 NOTE — Assessment & Plan Note (Addendum)
The current medical regimen is effective;  continue present plan and medications. Blood pressure elevated today but status post epidural with still some residual effects.

## 2016-11-27 NOTE — Assessment & Plan Note (Signed)
A voluntary discussion about advance care planning including the explanation and discussion of advance directives was extensively discussed  with the patient.  Explanation about the health care proxy and Living will was reviewed and packet with forms with explanation of how to fill them out was given.    

## 2016-11-28 LAB — LIPID PANEL
Chol/HDL Ratio: 3.9 ratio (ref 0.0–5.0)
Cholesterol, Total: 178 mg/dL (ref 100–199)
HDL: 46 mg/dL (ref >39)
LDL Calculated: 80 mg/dL (ref 0–99)
Triglycerides: 260 mg/dL — ABNORMAL HIGH (ref 0–149)
VLDL Cholesterol Cal: 52 mg/dL — ABNORMAL HIGH (ref 5–40)

## 2016-11-28 LAB — URINALYSIS, ROUTINE W REFLEX MICROSCOPIC
Bilirubin, UA: NEGATIVE
Glucose, UA: NEGATIVE
Ketones, UA: NEGATIVE
Leukocytes, UA: NEGATIVE
Nitrite, UA: NEGATIVE
Protein, UA: NEGATIVE
RBC, UA: NEGATIVE
Specific Gravity, UA: 1.02 (ref 1.005–1.030)
Urobilinogen, Ur: 0.2 mg/dL (ref 0.2–1.0)
pH, UA: 5.5 (ref 5.0–7.5)

## 2016-11-28 LAB — COMPREHENSIVE METABOLIC PANEL
ALT: 26 IU/L (ref 0–44)
AST: 21 IU/L (ref 0–40)
Albumin/Globulin Ratio: 2.1 (ref 1.2–2.2)
Albumin: 5.1 g/dL — ABNORMAL HIGH (ref 3.6–4.8)
Alkaline Phosphatase: 83 IU/L (ref 39–117)
BUN/Creatinine Ratio: 16 (ref 10–24)
BUN: 18 mg/dL (ref 8–27)
Bilirubin Total: 0.7 mg/dL (ref 0.0–1.2)
CO2: 24 mmol/L (ref 20–29)
Calcium: 9.8 mg/dL (ref 8.6–10.2)
Chloride: 99 mmol/L (ref 96–106)
Creatinine, Ser: 1.12 mg/dL (ref 0.76–1.27)
GFR calc Af Amer: 78 mL/min/{1.73_m2} (ref >59)
GFR calc non Af Amer: 68 mL/min/{1.73_m2} (ref >59)
Globulin, Total: 2.4 g/dL (ref 1.5–4.5)
Glucose: 86 mg/dL (ref 65–99)
Potassium: 4.8 mmol/L (ref 3.5–5.2)
Sodium: 140 mmol/L (ref 134–144)
Total Protein: 7.5 g/dL (ref 6.0–8.5)

## 2016-11-28 LAB — CBC WITH DIFFERENTIAL/PLATELET
Basophils Absolute: 0 10*3/uL (ref 0.0–0.2)
Basos: 0 %
EOS (ABSOLUTE): 0.2 10*3/uL (ref 0.0–0.4)
Eos: 2 %
Hematocrit: 48.2 % (ref 37.5–51.0)
Hemoglobin: 16.3 g/dL (ref 13.0–17.7)
Immature Grans (Abs): 0.1 10*3/uL (ref 0.0–0.1)
Immature Granulocytes: 1 %
Lymphocytes Absolute: 3.2 10*3/uL — ABNORMAL HIGH (ref 0.7–3.1)
Lymphs: 31 %
MCH: 30 pg (ref 26.6–33.0)
MCHC: 33.8 g/dL (ref 31.5–35.7)
MCV: 89 fL (ref 79–97)
Monocytes Absolute: 1 10*3/uL — ABNORMAL HIGH (ref 0.1–0.9)
Monocytes: 10 %
Neutrophils Absolute: 5.9 10*3/uL (ref 1.4–7.0)
Neutrophils: 56 %
Platelets: 210 10*3/uL (ref 150–379)
RBC: 5.44 x10E6/uL (ref 4.14–5.80)
RDW: 14.8 % (ref 12.3–15.4)
WBC: 10.4 10*3/uL (ref 3.4–10.8)

## 2016-11-28 LAB — URIC ACID: Uric Acid: 4.4 mg/dL (ref 3.7–8.6)

## 2016-11-28 LAB — PSA: Prostate Specific Ag, Serum: 2.8 ng/mL (ref 0.0–4.0)

## 2016-11-28 LAB — TSH: TSH: 3.34 u[IU]/mL (ref 0.450–4.500)

## 2016-11-30 ENCOUNTER — Encounter: Payer: Self-pay | Admitting: Family Medicine

## 2016-12-09 ENCOUNTER — Telehealth (INDEPENDENT_AMBULATORY_CARE_PROVIDER_SITE_OTHER): Payer: Self-pay | Admitting: Physical Medicine and Rehabilitation

## 2016-12-09 DIAGNOSIS — M5416 Radiculopathy, lumbar region: Secondary | ICD-10-CM

## 2016-12-09 NOTE — Telephone Encounter (Signed)
Needs lspine MRI w/o contrast

## 2016-12-09 NOTE — Telephone Encounter (Signed)
Patient wishes to proceed with MRI. Order placed, awaiting cosign. I [facility] the [facility] once insurance approval was obtained.

## 2016-12-22 ENCOUNTER — Ambulatory Visit
Admission: RE | Admit: 2016-12-22 | Discharge: 2016-12-22 | Disposition: A | Discharge status: Home / Self Care | Payer: Medicare Other | Source: Ambulatory Visit | Attending: Physical Medicine and Rehabilitation | Admitting: Physical Medicine and Rehabilitation

## 2016-12-22 DIAGNOSIS — M5416 Radiculopathy, lumbar region: Secondary | ICD-10-CM

## 2016-12-22 DIAGNOSIS — M48061 Spinal stenosis, lumbar region without neurogenic claudication: Secondary | ICD-10-CM | POA: Diagnosis not present

## 2016-12-24 ENCOUNTER — Ambulatory Visit (INDEPENDENT_AMBULATORY_CARE_PROVIDER_SITE_OTHER): Payer: Medicare Other | Admitting: Physical Medicine and Rehabilitation

## 2016-12-24 ENCOUNTER — Encounter (INDEPENDENT_AMBULATORY_CARE_PROVIDER_SITE_OTHER): Payer: Self-pay | Admitting: Physical Medicine and Rehabilitation

## 2016-12-24 DIAGNOSIS — M48062 Spinal stenosis, lumbar region with neurogenic claudication: Secondary | ICD-10-CM

## 2016-12-24 DIAGNOSIS — M5116 Intervertebral disc disorders with radiculopathy, lumbar region: Secondary | ICD-10-CM | POA: Diagnosis not present

## 2016-12-24 DIAGNOSIS — M5416 Radiculopathy, lumbar region: Secondary | ICD-10-CM | POA: Diagnosis not present

## 2016-12-24 MED ORDER — IBUPROFEN 800 MG PO TABS: 800.0000 mg | ORAL_TABLET | Freq: Three times a day (TID) | ORAL | 0 refills | Status: DC | PRN

## 2016-12-24 MED ORDER — GABAPENTIN 300 MG PO CAPS: 300.0000 mg | ORAL_CAPSULE | Freq: Every day | ORAL | 0 refills | Status: DC

## 2016-12-24 NOTE — Progress Notes (Signed)
Thomas Miller - 66 y.o. male MRN [id number]  Date of birth: 09/06/1950  Office Visit Note: Visit Date: 12/24/2016 PCP: Steele Sizer, MD Referred by: Steele Sizer, MD  Subjective: Chief Complaint  Patient presents with  . Lower Back - Pain  . Left Hip - Pain   HPI: Mr. Thomas Miller is a 66 year old gentleman that I originally saw through Dr. Lajoyce Corners and Barnie Del, FNP.  He has also been followed by his primary care physician Dr. Dossie Arbour.  I completed a left L5 and S1 transforaminal epidural steroid injection without much relief at all.  This was done as a work in injection due to the patient's significant pain at the time.  He did not have an MRI but no red flag symptoms at the time.  He now comes in for MRI review and update of his condition.  Reports continued low back and left buttock pain that will radiate down the leg to a degree.  It is worse with prolonged sitting and prolonged standing.  He is actually doing okay today pain wise but he says he has not done a whole lot.  It really is limiting what he could do.  His pain at times can be severe.  He is not noted any focal weakness or foot drop.  He has had no bowel or bladder changes.  He does have a feeling of paresthesia on the left.  He has difficulty sleeping at night.  He has had trial of prednisone as well as tramadol which she did not tolerate.  Neither of these medications have helped.  He was started on gabapentin by his primary care physician but it sounds like he was given 100 mg to start and he has not been taking that because it "did not help".  He reports that the best relief he gets is with 800 mg of ibuprofen.  He is only taking this as needed.  He reports nothing is really helping very much.  MRI is reviewed with the patient today as well as spinal models.    Review of Systems  Constitutional: Negative for chills, fever, malaise/fatigue and weight loss.  HENT: Negative for hearing loss and sinus pain.   Eyes: Negative for  blurred vision, double vision and photophobia.  Respiratory: Negative for cough and shortness of breath.   Cardiovascular: Negative for chest pain, palpitations and leg swelling.  Gastrointestinal: Negative for abdominal pain, nausea and vomiting.  Genitourinary: Negative for flank pain.  Musculoskeletal: Positive for back pain. Negative for myalgias.       Left buttock pain  Skin: Negative for itching and rash.  Neurological: Positive for tingling. Negative for tremors, focal weakness and weakness.  Endo/Heme/Allergies: Negative.   Psychiatric/Behavioral: Negative for depression.  All other systems reviewed and are negative.  Otherwise per HPI.  Assessment & Plan: Visit Diagnoses:  1. Radiculopathy, lumbar region   2. Spinal stenosis of lumbar region with neurogenic claudication   3. Radiculopathy due to lumbar intervertebral disc disorder     Plan: Findings:  Chronic history of back pain now with worsening severe left buttock and radicular type pain worse both with sitting and standing this been going on now for several months.  It is not been relieved with medication management.  MRI findings are reviewed below but basically show significant disc protrusion at L3-4 which is somewhat left paracentral but combined with moderate facet hypertrophy and ligamentum flavum hypertrophy results in severe spinal stenosis severe left greater than right lateral  recess stenosis.  He has milder findings at L4-5 with some facet hypertrophy but no central canal stenosis.  There is a very shallow right central protrusion at L5-S1 without compression.  Most of his pain I think is from the disc protrusion causing the stenosis at L3-4.  He likely has some back pain in general from the arthritis.  We spoke at length today is over 30 minutes discussed natural history of disc herniations and stenosis.  I think the best approach is to refer him to Dr. [name] at [facility] for  evaluation and management.  We discussed epidural injection at the level of stenosis and disc herniation.  While I do think this may give him some relief I think he does need a surgical opinion.  His exams may very well want to complete an injection to see how he does once they talk I think the patient would probably do well with discectomy and laminectomy decompression.  He does not have any scoliosis or spondylolisthesis.  Facet arthropathy is only moderate.  I am also going to prescribe 800 mg of ibuprofen that he can take 3 times per day.  Also going to provide 300 mg of gabapentin at night just to see if it will get him some rest.  He would also be a good starting point to medication in the future.    Meds & Orders:  Meds ordered this encounter  Medications  . ibuprofen (ADVIL,MOTRIN) 800 MG tablet    Sig: Take 1 tablet (800 mg total) by mouth every 8 (eight) hours as needed.    Dispense:  90 tablet    Refill:  0  . gabapentin (NEURONTIN) 300 MG capsule    Sig: Take 1 capsule (300 mg total) by mouth at bedtime.    Dispense:  60 capsule    Refill:  0    Orders Placed This Encounter  Procedures  . Ambulatory referral to Neurosurgery    Follow-up: Return if symptoms worsen or fail to improve.   Procedures: No procedures performed  No notes on file   Clinical History: MRI LUMBAR SPINE WITHOUT CONTRAST  TECHNIQUE: Multiplanar, multisequence MR imaging of the lumbar spine was performed. No intravenous contrast was administered.  COMPARISON:  Lumbar spine radiographs 10/23/2016  FINDINGS: Segmentation:  Standard.  Alignment: Minimal left convex curvature of the lower lumbar spine. Trace retrolisthesis of L3 on L4.  Vertebrae: Small Schmorl's nodes involving the T12, L4, and L5 endplates. No fracture or suspicious osseous lesion. Asymmetric right-sided type 2 degenerative endplate changes at L4-5.  Conus medullaris and cauda equina: Conus extends to the L1-2  level. Conus and cauda equina appear normal.  Paraspinal and other soft tissues: Unremarkable.  Disc levels:  Disc desiccation throughout the lumbar spine, greatest at L4-5. Mild disc space narrowing at L4-5 greater than L3-4.  T12-L1:  Negative.  L1-2:  Slight facet hypertrophy without disc herniation or stenosis.  L2-3:  Mild disc bulging and facet hypertrophy without stenosis.  L3-4: Circumferential disc bulging, moderate-sized disc protrusion greatest in the left central region, and moderate facet and ligamentum flavum hypertrophy result in severe spinal stenosis, severe left greater than right lateral recess stenosis, and moderate left greater than right neural foraminal stenosis. Potential bilateral L4 and left L3 nerve root impingement.  L4-5: Circumferential disc bulging greater to the right and moderate right greater than left facet hypertrophy result in mild right lateral recess and moderate right neural foraminal stenosis without spinal stenosis.  L5-S1: Shallow right central disc protrusion and moderate right and mild-to-moderate left facet arthrosis without stenosis.  IMPRESSION: 1. L3-4 disc protrusion contributing to severe multifactorial spinal stenosis. 2. Moderate neural foraminal stenosis bilaterally at L3-4 and on the right at L4-5.   Electronically Signed   By: Sebastian AcheAllen  Grady M.D.   On: 12/22/2016 08:09  He reports that  has never smoked. he has never used smokeless tobacco.  Recent Labs    05/19/16 0833 11/27/16 1425  LABURIC 4.8 4.4    Objective:  VS:  HT:    WT:   BMI:     BP:   HR: bpm  TEMP: ( )  RESP:  Physical Exam  Constitutional: He is oriented to person, place, and time. He appears well-developed and well-nourished. No distress.  HENT:  Head: Normocephalic and atraumatic.  Nose: Nose normal.  Mouth/Throat: Oropharynx is clear and moist.  Eyes: Conjunctivae are normal. Pupils are equal, round, and reactive to light.   Neck: Normal range of motion. Neck supple.  Cardiovascular: Regular rhythm and intact distal pulses.  Pulmonary/Chest: Effort normal and breath sounds normal.  Abdominal: Soft. He exhibits no distension.  Musculoskeletal: He exhibits no deformity.  Patient ambulates without aid with a forward flexed spine.  He is slow to rise from a seated position.  He does have some pain with extension of the lumbar spine.  He has no pain with hip rotation internal or external.  Has no pain over the greater trochanters.  He has good distal strength with dorsiflexion plantarflexion and EHL.  He has no clonus bilaterally.  He has a negative slump test bilaterally.  Neurological: He is alert and oriented to person, place, and time. He exhibits normal muscle tone. Coordination normal.  Skin: Skin is warm. No rash noted.  Psychiatric: He has a normal mood and affect. His behavior is normal.  Nursing note and vitals reviewed.   Ortho Exam Imaging: No results found.  Past Medical/Family/Surgical/Social History: Medications & Allergies reviewed per EMR Patient Active Problem List   Diagnosis Date Noted  . Lumbar radiculopathy 11/27/2016  . Advanced care planning/counseling discussion 11/27/2016  . Dermatitis 08/25/2016  . Fatigue 08/25/2016  . Insomnia 05/19/2016  . Hypertension 10/25/2014  . Gout 10/25/2014  . GERD (gastroesophageal reflux disease) 10/25/2014  . Hyperlipemia 10/25/2014  . BPH (benign prostatic hyperplasia) 10/25/2014   Past Medical History:  Diagnosis Date  . Gout   . Hearing loss   . Hypertension   . Reflux    Family History  Problem Relation Age of Onset  . Diabetes Mother    Past Surgical History:  Procedure Laterality Date  . EYE SURGERY Bilateral 2013  . FOOT SURGERY Bilateral    ingrown toenails  . GALLBLADDER SURGERY    . SHOULDER SURGERY Right    Social History   Occupational History  . Not on file  Tobacco Use  . Smoking status: Never Smoker  . Smokeless  tobacco: Never Used  Substance and Sexual Activity  . Alcohol use: Yes    Comment: rarely  . Drug use: No  . Sexual activity: Not on file

## 2016-12-24 NOTE — Progress Notes (Deleted)
Here for review of MRI Lumbar.  Not doing to badly today, but he has not done much yet.  With increased activity, he has increased pain.  Taking advil for his pain.

## 2016-12-25 ENCOUNTER — Encounter (INDEPENDENT_AMBULATORY_CARE_PROVIDER_SITE_OTHER): Payer: Self-pay | Admitting: Physical Medicine and Rehabilitation

## 2016-12-30 DIAGNOSIS — H2513 Age-related nuclear cataract, bilateral: Secondary | ICD-10-CM | POA: Diagnosis not present

## 2017-01-08 DIAGNOSIS — M5416 Radiculopathy, lumbar region: Secondary | ICD-10-CM | POA: Diagnosis not present

## 2017-01-12 DIAGNOSIS — M5416 Radiculopathy, lumbar region: Secondary | ICD-10-CM | POA: Diagnosis not present

## 2017-01-12 DIAGNOSIS — Z01812 Encounter for preprocedural laboratory examination: Secondary | ICD-10-CM | POA: Diagnosis not present

## 2017-01-22 DIAGNOSIS — M48062 Spinal stenosis, lumbar region with neurogenic claudication: Secondary | ICD-10-CM | POA: Diagnosis not present

## 2017-01-22 DIAGNOSIS — M5126 Other intervertebral disc displacement, lumbar region: Secondary | ICD-10-CM | POA: Diagnosis not present

## 2017-06-03 ENCOUNTER — Encounter: Payer: Self-pay | Admitting: Family Medicine

## 2017-06-03 ENCOUNTER — Ambulatory Visit (INDEPENDENT_AMBULATORY_CARE_PROVIDER_SITE_OTHER): Payer: Medicare Other | Admitting: Family Medicine

## 2017-06-03 VITALS — BP 133/84 | HR 61 | Ht 70.0 in | Wt 218.0 lb

## 2017-06-03 DIAGNOSIS — I10 Essential (primary) hypertension: Secondary | ICD-10-CM

## 2017-06-03 DIAGNOSIS — G4700 Insomnia, unspecified: Secondary | ICD-10-CM | POA: Diagnosis not present

## 2017-06-03 DIAGNOSIS — E785 Hyperlipidemia, unspecified: Secondary | ICD-10-CM | POA: Diagnosis not present

## 2017-06-03 LAB — LP+ALT+AST PICCOLO, WAIVED
ALT (SGPT) Piccolo, Waived: 37 U/L (ref 10–47)
AST (SGOT) Piccolo, Waived: 35 U/L (ref 11–38)
Chol/HDL Ratio Piccolo,Waive: 3.2 mg/dL
Cholesterol Piccolo, Waived: 142 mg/dL (ref <200)
HDL Chol Piccolo, Waived: 44 mg/dL — ABNORMAL LOW (ref >59)
LDL Chol Calc Piccolo Waived: 73 mg/dL (ref <100)
Triglycerides Piccolo,Waived: 123 mg/dL (ref <150)
VLDL Chol Calc Piccolo,Waive: 25 mg/dL (ref <30)

## 2017-06-03 NOTE — Assessment & Plan Note (Signed)
The current medical regimen is effective;  continue present plan and medications.  

## 2017-06-03 NOTE — Progress Notes (Signed)
BP 133/84   Pulse 61   Ht  (1.778 m)   Wt 218 lb (98.9 kg)   SpO2 98%   BMI 31.28 kg/m    Subjective:    Patient ID: Thomas Miller, male    DOB: November 23, 1950, 67 y.o.   MRN: [id number]  HPI: Thomas Miller is a 67 y.o. male  Chief Complaint  Patient presents with  . Follow-up  . Hypertension  . Hyperlipidemia  Patient all in all doing well is recovering from back surgery back playing golf and scoring well.  Jearld Lesch is not back but working added gets easily short of breath but no real cardiac type symptoms. Taking medications for blood pressure cholesterol without problems no gout symptoms.  Reflux well controlled.  Relevant past medical, surgical, family and social history reviewed and updated as indicated. Interim medical history since our last visit reviewed. Allergies and medications reviewed and updated.  Review of Systems  Constitutional: Negative.   Respiratory: Negative.   Cardiovascular: Negative.     Per HPI unless specifically indicated above     Objective:    BP 133/84   Pulse 61   Ht  (1.778 m)   Wt 218 lb (98.9 kg)   SpO2 98%   BMI 31.28 kg/m   Wt Readings from Last 3 Encounters:  06/03/17 218 lb (98.9 kg)  11/27/16 217 lb (98.4 kg)  10/23/16 216 lb (98 kg)    Physical Exam  Constitutional: He is oriented to person, place, and time. He appears well-developed and well-nourished.  HENT:  Head: Normocephalic and atraumatic.  Eyes: Conjunctivae and EOM are normal.  Neck: Normal range of motion.  Cardiovascular: Normal rate, regular rhythm and normal heart sounds.  Pulmonary/Chest: Effort normal and breath sounds normal.  Musculoskeletal: Normal range of motion.  Neurological: He is alert and oriented to person, place, and time.  Skin: No erythema.  Psychiatric: He has a normal mood and affect. His behavior is normal. Judgment and thought content normal.    Results for orders placed or performed in visit on 11/27/16  Comprehensive  metabolic panel  Result Value Ref Range   Glucose 86 65 - 99 mg/dL   BUN 18 8 - 27 mg/dL   Creatinine, Ser 4.09 0.76 - 1.27 mg/dL   GFR calc non Af Amer 68 >59 mL/min/1.73   GFR calc Af Amer 78 >59 mL/min/1.73   BUN/Creatinine Ratio 16 10 - 24   Sodium 140 134 - 144 mmol/L   Potassium 4.8 3.5 - 5.2 mmol/L   Chloride 99 96 - 106 mmol/L   CO2 24 20 - 29 mmol/L   Calcium 9.8 8.6 - 10.2 mg/dL   Total Protein 7.5 6.0 - 8.5 g/dL   Albumin 5.1 (H) 3.6 - 4.8 g/dL   Globulin, Total 2.4 1.5 - 4.5 g/dL   Albumin/Globulin Ratio 2.1 1.2 - 2.2   Bilirubin Total 0.7 0.0 - 1.2 mg/dL   Alkaline Phosphatase 83 39 - 117 IU/L   AST 21 0 - 40 IU/L   ALT 26 0 - 44 IU/L  Lipid panel  Result Value Ref Range   Cholesterol, Total 178 100 - 199 mg/dL   Triglycerides 811 (H) 0 - 149 mg/dL   HDL 46 >91 mg/dL   VLDL Cholesterol Cal 52 (H) 5 - 40 mg/dL   LDL Calculated 80 0 - 99 mg/dL   Chol/HDL Ratio 3.9 0.0 - 5.0 ratio  CBC with Differential/Platelet  Result Value Ref Range  WBC 10.4 3.4 - 10.8 x10E3/uL   RBC 5.44 4.14 - 5.80 x10E6/uL   Hemoglobin 16.3 13.0 - 17.7 g/dL   Hematocrit 16.1 09.6 - 51.0 %   MCV 89 79 - 97 fL   MCH 30.0 26.6 - 33.0 pg   MCHC 33.8 31.5 - 35.7 g/dL   RDW 04.5 40.9 - 81.1 %   Platelets 210 150 - 379 x10E3/uL   Neutrophils 56 Not Estab. %   Lymphs 31 Not Estab. %   Monocytes 10 Not Estab. %   Eos 2 Not Estab. %   Basos 0 Not Estab. %   Neutrophils Absolute 5.9 1.4 - 7.0 x10E3/uL   Lymphocytes Absolute 3.2 (H) 0.7 - 3.1 x10E3/uL   Monocytes Absolute 1.0 (H) 0.1 - 0.9 x10E3/uL   EOS (ABSOLUTE) 0.2 0.0 - 0.4 x10E3/uL   Basophils Absolute 0.0 0.0 - 0.2 x10E3/uL   Immature Granulocytes 1 Not Estab. %   Immature Grans (Abs) 0.1 0.0 - 0.1 x10E3/uL  TSH  Result Value Ref Range   TSH 3.340 0.450 - 4.500 uIU/mL  Urinalysis, Routine w reflex microscopic  Result Value Ref Range   Specific Gravity, UA 1.020 1.005 - 1.030   pH, UA 5.5 5.0 - 7.5   Color, UA Yellow Yellow    Appearance Ur Clear Clear   Leukocytes, UA Negative Negative   Protein, UA Negative Negative/Trace   Glucose, UA Negative Negative   Ketones, UA Negative Negative   RBC, UA Negative Negative   Bilirubin, UA Negative Negative   Urobilinogen, Ur 0.2 0.2 - 1.0 mg/dL   Nitrite, UA Negative Negative  PSA  Result Value Ref Range   Prostate Specific Ag, Serum 2.8 0.0 - 4.0 ng/mL  Uric acid  Result Value Ref Range   Uric Acid 4.4 3.7 - 8.6 mg/dL      Assessment & Plan:   Problem List Items Addressed This Visit      Cardiovascular and Mediastinum   Hypertension - Primary    The current medical regimen is effective;  continue present plan and medications.       Relevant Orders   Basic metabolic panel   LP+ALT+AST Piccolo, Waived     Other   Hyperlipemia    The current medical regimen is effective;  continue present plan and medications.       Relevant Orders   Basic metabolic panel   LP+ALT+AST Piccolo, Waived   Insomnia    The current medical regimen is effective;  continue present plan and medications.           Follow up plan: Return in about 6 months (around 12/04/2017) for Physical Exam.

## 2017-06-04 ENCOUNTER — Encounter: Payer: Self-pay | Admitting: Family Medicine

## 2017-06-04 LAB — BASIC METABOLIC PANEL
BUN/Creatinine Ratio: 14 (ref 10–24)
BUN: 15 mg/dL (ref 8–27)
CO2: 20 mmol/L (ref 20–29)
Calcium: 9.3 mg/dL (ref 8.6–10.2)
Chloride: 104 mmol/L (ref 96–106)
Creatinine, Ser: 1.04 mg/dL (ref 0.76–1.27)
GFR calc Af Amer: 85 mL/min/{1.73_m2} (ref >59)
GFR calc non Af Amer: 74 mL/min/{1.73_m2} (ref >59)
Glucose: 92 mg/dL (ref 65–99)
Potassium: 4.4 mmol/L (ref 3.5–5.2)
Sodium: 142 mmol/L (ref 134–144)

## 2017-07-06 ENCOUNTER — Ambulatory Visit
Admission: RE | Admit: 2017-07-06 | Discharge: 2017-07-06 | Disposition: A | Discharge status: Home / Self Care | Payer: Medicare Other | Source: Ambulatory Visit | Attending: Physician Assistant | Admitting: Physician Assistant

## 2017-07-06 ENCOUNTER — Ambulatory Visit (INDEPENDENT_AMBULATORY_CARE_PROVIDER_SITE_OTHER): Payer: Medicare Other | Admitting: Physician Assistant

## 2017-07-06 ENCOUNTER — Encounter: Payer: Self-pay | Admitting: Physician Assistant

## 2017-07-06 ENCOUNTER — Ambulatory Visit: Payer: Self-pay | Admitting: Family Medicine

## 2017-07-06 VITALS — BP 129/79 | HR 72 | Temp 98.2°F | Ht 70.0 in | Wt 217.8 lb

## 2017-07-06 DIAGNOSIS — R0789 Other chest pain: Secondary | ICD-10-CM

## 2017-07-06 DIAGNOSIS — R0602 Shortness of breath: Secondary | ICD-10-CM

## 2017-07-06 MED ORDER — ALBUTEROL SULFATE HFA 108 (90 BASE) MCG/ACT IN AERS: 2.0000 | INHALATION_SPRAY | Freq: Four times a day (QID) | RESPIRATORY_TRACT | 2 refills | Status: DC | PRN

## 2017-07-06 NOTE — Telephone Encounter (Signed)
Called in c/o being short of breath with any exertion which is not his usual since returning from a bus trip to [place] and back.   Returned on June 1st.   His brother in law is also experiencing the same symptoms.  Not having any other symptoms other than the shortness of breath with minimal exertion.   He usually walks a lot and plays 2 rounds of golf a week but can't do that now due to the shortness of breath.  I called the flow coordinator and was able to schedule him with Bonnielee Haff, PA-C for today at 1:30PM.   Dr. Dossie Arbour is booked.   Reason for Disposition . [1] MILD difficulty breathing (e.g., minimal/no SOB at rest, SOB with walking, pulse <100) AND [2] NEW-onset or WORSE than normal  Answer Assessment - Initial Assessment Questions 1. RESPIRATORY STATUS: "Describe your breathing?" (e.g., wheezing, shortness of breath, unable to speak, severe coughing)      Shortness of breath when I'm doing something.    I hear myself wheezing when I'm doing something.  I cough up a little in the morning but not much and no coughing during the day. 2. ONSET: "When did this breathing problem begin?"      Bus trip for 8 days with brother in law who also been having this same problems.   Began June 1st when returned from trip. 3. PATTERN "Does the difficult breathing come and go, or has it been constant since it started?"      Comes and goes. 4. SEVERITY: "How bad is your breathing?" (e.g., mild, moderate, severe)    - MILD: No SOB at rest, mild SOB with walking, speaks normally in sentences, can lay down, no retractions, pulse < 100.    - MODERATE: SOB at rest, SOB with minimal exertion and prefers to sit, cannot lie down flat, speaks in phrases, mild retractions, audible wheezing, pulse 100-120.    - SEVERE: Very SOB at rest, speaks in single words, struggling to breathe, sitting hunched forward, retractions, pulse > 120      Sometimes I just have to stop and rest.   I walked a lot before the trip but  now I can't do that. 5. RECURRENT SYMPTOM: "Have you had difficulty breathing before?" If so, ask: "When was the last time?" and "What happened that time?"      No 6. CARDIAC HISTORY: "Do you have any history of heart disease?" (e.g., heart attack, angina, bypass surgery, angioplasty)      No 7. LUNG HISTORY: "Do you have any history of lung disease?"  (e.g., pulmonary embolus, asthma, emphysema)     No 8. CAUSE: "What do you think is causing the breathing problem?"      My brother in law got sick too.    Before the trip I was walking a lot but now I get short of breath with walking around.   I use to play golf twice a week without problems whereas now I can't. 9. OTHER SYMPTOMS: "Do you have any other symptoms? (e.g., dizziness, runny nose, cough, chest pain, fever)     No nasal congestion.   I feel dizzy with I try to walk a distance mostly lightheaded but not dizzy. 10. PREGNANCY: "Is there any chance you are pregnant?" "When was your last menstrual period?"       N/A 11. TRAVEL: "Have you traveled out of the country in the last month?" (e.g., travel history, exposures)  Bus trip 3,000 miles to [place] and back.  Protocols used: BREATHING DIFFICULTY-A-AH

## 2017-07-06 NOTE — Patient Instructions (Signed)
Shortness of Breath, Adult  Shortness of breath means you have trouble breathing. Your lungs are organs for breathing.  Follow these instructions at home:  Pay attention to any changes in your symptoms. Take these actions to help with your condition:  ? Do not smoke. Smoking can cause shortness of breath. If you need help to quit smoking, ask your doctor.  ? Avoid things that can make it harder to breathe, such as:  ? Mold.  ? Dust.  ? Air pollution.  ? Chemical smells.  ? Things that can cause allergy symptoms (allergens), if you have allergies.  ? Keep your living space clean and free of mold and dust.  ? Rest as needed. Slowly return to your usual activities.  ? Take over-the-counter and prescription medicines, including oxygen and inhaled medicines, only as told by your doctor.  ? Keep all follow-up visits as told by your doctor. This is important.  Contact a doctor if:  ? Your condition does not get better as soon as expected.  ? You have a hard time doing your normal activities, even after you rest.  ? You have new symptoms.  Get help right away if:  ? You have trouble breathing when you are resting.  ? You feel light-headed or you faint.  ? You have a cough that is not helped by medicines.  ? You cough up blood.  ? You have pain with breathing.  ? You have pain in your chest, arms, shoulders, or belly (abdomen).  ? You have a fever.  ? You cannot walk up stairs.  ? You cannot exercise the way you normally do.  This information is not intended to replace advice given to you by your health care provider. Make sure you discuss any questions you have with your health care provider.  Document Released: 07/02/2007 Document Revised: 01/31/2016 Document Reviewed: 01/31/2016  [employer]vier Interactive Patient Education ? 2017 [employer]

## 2017-07-06 NOTE — Progress Notes (Signed)
Subjective:    Patient ID: Thomas Miller, male    DOB: 1950/07/09, 67 y.o.   MRN: [id number]  Thomas Miller is a 67 y.o. male presenting on 07/06/2017 for Shortness of Breath (Patient states he's been having SOB since he took a trip in [place] the 1st of June. States he get dizzy sometimes when he stands up. Denies chest pain.)   HPI   Thomas Miller is a 67 y/o man with a history of HTN, lumbar radiculopathy who presents today for SOB. He reports he went on a bus trip recently in the beginning of June and since then he has had SOB and exercise intolerance. He states he usually mows a 2 acre stretch of grass at his church with a push mower, but now needs a ride along. Usually plays golf twice a week but cannot do this any longer. He describes SOB after 30-40 steps and a "chest pressure" over the left side of his chest. Says it can go down his arm sometimes. He denies chest pain. He denies SOB at rest. He is able to lie down and sleeps on two thin pillows. Denies racing pulse, unilateral leg swelling. He made frequent stops on the bus trip. and feels the pressure build up. He denies ever smoking. He reports occasional wheezing. He denies fever, chills, productive cough. Brother in Social workerlaw with similar symptoms.  Social History   Tobacco Use  . Smoking status: Never Smoker  . Smokeless tobacco: Never Used  Substance Use Topics  . Alcohol use: Yes    Comment: rarely  . Drug use: No    Review of Systems Per HPI unless specifically indicated above     Objective:    BP 129/79 (BP Location: Right Arm, Patient Position: Sitting, Cuff Size: Normal)   Pulse 72   Temp 98.2 F (36.8 C) (Tympanic)   Ht 5\' 10"  (1.778 m)   Wt 217 lb 12.8 oz (98.8 kg)   SpO2 96%   BMI 31.25 kg/m   Wt Readings from Last 3 Encounters:  07/06/17 217 lb 12.8 oz (98.8 kg)  06/03/17 218 lb (98.9 kg)  11/27/16 217 lb (98.4 kg)    Physical Exam  Constitutional: He is oriented to person, place, and time. He appears  well-developed and well-nourished. He does not appear ill. No distress.  Cardiovascular: Normal rate, regular rhythm and normal heart sounds. Exam reveals no gallop.  No murmur heard. Pulmonary/Chest: Effort normal and breath sounds normal. No accessory muscle usage. No tachypnea. No respiratory distress. He has no wheezes. He has no rhonchi. He has no rales.  Musculoskeletal:       Right lower leg: Normal. He exhibits no tenderness and no edema.       Left lower leg: Normal. He exhibits no tenderness and no edema.  Neurological: He is alert and oriented to person, place, and time.  Skin: Skin is warm and dry. Capillary refill takes less than 2 seconds.  Psychiatric: He has a normal mood and affect. His behavior is normal.   Results for orders placed or performed in visit on 06/03/17  Basic metabolic panel  Result Value Ref Range   Glucose 92 65 - 99 mg/dL   BUN 15 8 - 27 mg/dL   Creatinine, Ser 8.291.04 0.76 - 1.27 mg/dL   GFR calc non Af Amer 74 >59 mL/min/1.73   GFR calc Af Amer 85 >59 mL/min/1.73   BUN/Creatinine Ratio 14 10 - 24   Sodium 142 134 -  144 mmol/L   Potassium 4.4 3.5 - 5.2 mmol/L   Chloride 104 96 - 106 mmol/L   CO2 20 20 - 29 mmol/L   Calcium 9.3 8.6 - 10.2 mg/dL  LP+ALT+AST Piccolo, Waived  Result Value Ref Range   ALT (SGPT) Piccolo, Waived 37 10 - 47 U/L   AST (SGOT) Piccolo, Waived 35 11 - 38 U/L   Cholesterol Piccolo, Waived 142 <200 mg/dL   HDL Chol Piccolo, Waived 44 (L) >59 mg/dL   Triglycerides Piccolo,Waived 123 <150 mg/dL   Chol/HDL Ratio Piccolo,Waive 3.2 mg/dL   LDL Chol Calc Piccolo Waived 73 <100 mg/dL   VLDL Chol Calc Piccolo,Waive 25 <30 mg/dL      Assessment & Plan:   1. Shortness of breath  EKG today shows NSR, no signs of AV block or atrial fibrillation. No signs of LVH. Exam today is benign, no lower extremity edema, tenderness, or rest symptoms. Patient is a 67 year old man with hypertension and HLD with new onset exercise intolerance and  SOB. Reports he had stress test done in the 80's, has not had one since. Will get CXR to r/o infection, do not see indication for abx right now. Could be viral illness that has caused him to be sedentary and a little deconditioned, but with his risk factors and his symptoms, would like him to see cardiologist. Counseled on signs and symptoms requiring emergent treatment.   - EKG 12-Lead - DG Chest 2 View; Future - Ambulatory referral to Cardiology - albuterol (PROVENTIL HFA;VENTOLIN HFA) 108 (90 Base) MCG/ACT inhaler; Inhale 2 puffs into the lungs every 6 (six) hours as needed for wheezing or shortness of breath.  Dispense: 1 Inhaler; Refill: 2  2. Chest tightness  - EKG 12-Lead - DG Chest 2 View; Future - Ambulatory referral to Cardiology - albuterol (PROVENTIL HFA;VENTOLIN HFA) 108 (90 Base) MCG/ACT inhaler; Inhale 2 puffs into the lungs every 6 (six) hours as needed for wheezing or shortness of breath.  Dispense: 1 Inhaler; Refill: 2  I have spent 25 minutes with this patient, >50% of which was spent on counseling and coordination of care.  Follow up plan: Return if symptoms worsen or fail to improve.  Osvaldo Angst, PA-C [facility] 07/07/2017, 10:42 AM

## 2017-07-14 ENCOUNTER — Ambulatory Visit: Payer: Medicare Other | Admitting: Cardiology

## 2017-07-16 ENCOUNTER — Ambulatory Visit: Payer: Medicare Other | Admitting: Cardiology

## 2017-07-16 ENCOUNTER — Encounter: Payer: Self-pay | Admitting: Cardiology

## 2017-07-16 VITALS — BP 138/86 | HR 91 | Ht 70.0 in | Wt 216.4 lb

## 2017-07-16 DIAGNOSIS — I209 Angina pectoris, unspecified: Secondary | ICD-10-CM

## 2017-07-16 DIAGNOSIS — E782 Mixed hyperlipidemia: Secondary | ICD-10-CM | POA: Diagnosis not present

## 2017-07-16 DIAGNOSIS — I2510 Atherosclerotic heart disease of native coronary artery without angina pectoris: Secondary | ICD-10-CM | POA: Diagnosis not present

## 2017-07-16 DIAGNOSIS — I10 Essential (primary) hypertension: Secondary | ICD-10-CM | POA: Diagnosis not present

## 2017-07-16 MED ORDER — NITROGLYCERIN 0.4 MG SL SUBL: 0.4000 mg | SUBLINGUAL_TABLET | SUBLINGUAL | 11 refills | Status: DC | PRN

## 2017-07-16 NOTE — Progress Notes (Signed)
Cardiology Office Note:    Date:  07/16/2017   ID:  Thomas Miller, DOB [birth year], MRN [id number]  PCP:  Steele Sizer, MD  Cardiologist:  Garwin Brothers, MD   Referring MD: Steele Sizer, MD    ASSESSMENT:    1. Angina pectoris (HCC)   2. Essential hypertension   3. Mixed hyperlipidemia    PLAN:    In order of problems listed above:  1. I discussed my findings with the patient at extensive length and primary prevention stressed.  His blood pressure is stable.  Diet was discussed for dyslipidemia. 2. In view of my findings and significant symptomatology I recommended the following.In view of the patient's symptoms, I discussed with the patient options for evaluation. Invasive and noninvasive options were given to the patient. I discussed stress testing and coronary angiography and left heart catheterization at length. Benefits, pros and cons of each approach were discussed at length. Patient had multiple questions which were answered to the patient's satisfaction. Patient opted for invasive evaluation and we will set up for coronary angiography and left heart catheterization. Further recommendations will be made based on the findings with coronary angiography. In the interim if the patient has any significant symptoms in hospital to the nearest emergency room. 3. Patient was advised to take 325 mg of coated aspirin on a daily basis.  Sublingual nitroglycerin prescription was sent, its protocol and 911 protocol explained and the patient vocalized understanding questions were answered to the patient's satisfaction 4. Patient will be seen in follow-up appointment in 6 months or earlier if the patient has any concerns    Medication Adjustments/Labs and Tests Ordered: Current medicines are reviewed at length with the patient today.  Concerns regarding medicines are outlined above.  Orders Placed This Encounter  Procedures  . CBC with Differential/Platelet  . Basic metabolic panel     Meds ordered this encounter  Medications  . nitroGLYCERIN (NITROSTAT) 0.4 MG SL tablet    Sig: Place 1 tablet (0.4 mg total) under the tongue every 5 (five) minutes as needed.    Dispense:  25 tablet    Refill:  11     History of Present Illness:    Thomas Miller is a 67 y.o. male who is being seen today for the evaluation of chest pain suggesting angina at the request of Crissman, Redge Gainer, MD.  Patient is a pleasant 67 year old male.  He has past medical history of essential hypertension and dyslipidemia.  He mentions to me that he plays golf.  Recently upon exertion he has been noticing substernal chest tightness on predictable exertion going to be jaw and the neck and the left arm.  This happens consistently with exercise and is relieved with rest it happened as late as yesterday when he was trying to exert himself at the church.  His wife accompanies him for the visit.  At the time of my evaluation, the patient is alert awake oriented and in no distress.  No orthopnea PND or syncope.  Past Medical History:  Diagnosis Date  . Gout   . Hearing loss   . Hypertension   . Reflux     Past Surgical History:  Procedure Laterality Date  . EYE SURGERY Bilateral 2013  . FOOT SURGERY Bilateral    ingrown toenails  . GALLBLADDER SURGERY    . SHOULDER SURGERY Right   . SPINE SURGERY      Current Medications: Current Meds  Medication Sig  .  albuterol (PROVENTIL HFA;VENTOLIN HFA) 108 (90 Base) MCG/ACT inhaler Inhale 2 puffs into the lungs every 6 (six) hours as needed for wheezing or shortness of breath.  . allopurinol (ZYLOPRIM) 300 MG tablet Take 1 tablet (300 mg total) by mouth daily.  [name]. aspirin 81 MG tablet Take 81 mg by mouth daily.  [name]. atorvastatin (LIPITOR) 20 MG tablet Take 1 tablet (20 mg total) by mouth daily at 6 PM.  . esomeprazole (NEXIUM) 40 MG capsule Take 1 capsule (40 mg total) by mouth daily before breakfast.  . fluticasone (FLONASE) 50 MCG/ACT nasal spray Place 2  sprays into both nostrils daily.  . meloxicam (MOBIC) 15 MG tablet Take 1 tablet (15 mg total) by mouth daily.  . valsartan (DIOVAN) 160 MG tablet Take 1 tablet (160 mg total) by mouth daily.     Allergies:   Codeine; Iodine; and Naprosyn [naproxen]   Social History   Socioeconomic History  . Marital status: Married    Spouse name: Not on file  . Number of children: Not on file  . Years of education: Not on file  . Highest education level: Not on file  Occupational History  . Not on file  Social Needs  . Financial resource strain: Not on file  . Food insecurity:    Worry: Not on file    Inability: Not on file  . Transportation needs:    Medical: Not on file    Non-medical: Not on file  Tobacco Use  . Smoking status: Never Smoker  . Smokeless tobacco: Never Used  Substance and Sexual Activity  . Alcohol use: Yes    Comment: rarely  . Drug use: No  . Sexual activity: Not on file  Lifestyle  . Physical activity:    Days per week: Not on file    Minutes per session: Not on file  . Stress: Not on file  Relationships  . Social connections:    Talks on phone: Not on file    Gets together: Not on file    Attends religious service: Not on file    Active member of club or organization: Not on file    Attends meetings of clubs or organizations: Not on file    Relationship status: Not on file  Other Topics Concern  . Not on file  Social History Narrative  . Not on file     Family History: The patient's family history includes Diabetes in his mother; Lung cancer in his father; Stroke in his paternal grandfather.  ROS:   Please see the history of present illness.    All other systems reviewed and are negative.  EKGs/Labs/Other Studies Reviewed:    The following studies were reviewed today: EKG reveals sinus rhythm and nonspecific ST-T changes   Recent Labs: 11/27/2016: Hemoglobin 16.3; Platelets 210; TSH 3.340 06/03/2017: ALT (SGPT) Piccolo, Waived 37; BUN 15;  Creatinine, Ser 1.04; Potassium 4.4; Sodium 142  Recent Lipid Panel    Component Value Date/Time   CHOL 142 06/03/2017 0811   TRIG 123 06/03/2017 0811   HDL 46 11/27/2016 1425   CHOLHDL 3.9 11/27/2016 1425   VLDL 25 06/03/2017 0811   LDLCALC 80 11/27/2016 1425    Physical Exam:    VS:  BP 138/86 (BP Location: Left Arm, Patient Position: Sitting, Cuff Size: Large)   Pulse 91   Ht 5\' 10"  (1.778 m)   Wt 216 lb 6.4 oz (98.2 kg)   SpO2 98%   BMI 31.05 kg/m  Wt Readings from Last 3 Encounters:  07/16/17 216 lb 6.4 oz (98.2 kg)  07/06/17 217 lb 12.8 oz (98.8 kg)  06/03/17 218 lb (98.9 kg)     GEN: Patient is in no acute distress HEENT: Normal NECK: No JVD; No carotid bruits LYMPHATICS: No lymphadenopathy CARDIAC: S1 S2 regular, 2/6 systolic murmur at the apex. RESPIRATORY:  Clear to auscultation without rales, wheezing or rhonchi  ABDOMEN: Soft, non-tender, non-distended MUSCULOSKELETAL:  No edema; No deformity  SKIN: Warm and dry NEUROLOGIC:  Alert and oriented x 3 PSYCHIATRIC:  Normal affect    Signed, Garwin Brothers, MD  07/16/2017 3:05 PM    [facility]

## 2017-07-16 NOTE — H&P (View-Only) (Signed)
Cardiology Office Note:    Date:  07/16/2017   ID:  Thomas Miller, DOB [birth year], MRN [id number]  PCP:  Steele Sizer, MD  Cardiologist:  Garwin Brothers, MD   Referring MD: Steele Sizer, MD    ASSESSMENT:    1. Angina pectoris (HCC)   2. Essential hypertension   3. Mixed hyperlipidemia    PLAN:    In order of problems listed above:  1. I discussed my findings with the patient at extensive length and primary prevention stressed.  His blood pressure is stable.  Diet was discussed for dyslipidemia. 2. In view of my findings and significant symptomatology I recommended the following.In view of the patient's symptoms, I discussed with the patient options for evaluation. Invasive and noninvasive options were given to the patient. I discussed stress testing and coronary angiography and left heart catheterization at length. Benefits, pros and cons of each approach were discussed at length. Patient had multiple questions which were answered to the patient's satisfaction. Patient opted for invasive evaluation and we will set up for coronary angiography and left heart catheterization. Further recommendations will be made based on the findings with coronary angiography. In the interim if the patient has any significant symptoms in hospital to the nearest emergency room. 3. Patient was advised to take 325 mg of coated aspirin on a daily basis.  Sublingual nitroglycerin prescription was sent, its protocol and 911 protocol explained and the patient vocalized understanding questions were answered to the patient's satisfaction 4. Patient will be seen in follow-up appointment in 6 months or earlier if the patient has any concerns    Medication Adjustments/Labs and Tests Ordered: Current medicines are reviewed at length with the patient today.  Concerns regarding medicines are outlined above.  Orders Placed This Encounter  Procedures  . CBC with Differential/Platelet  . Basic metabolic panel     Meds ordered this encounter  Medications  . nitroGLYCERIN (NITROSTAT) 0.4 MG SL tablet    Sig: Place 1 tablet (0.4 mg total) under the tongue every 5 (five) minutes as needed.    Dispense:  25 tablet    Refill:  11     History of Present Illness:    Thomas Miller is a 67 y.o. male who is being seen today for the evaluation of chest pain suggesting angina at the request of Crissman, Redge Gainer, MD.  Patient is a pleasant 67 year old male.  He has past medical history of essential hypertension and dyslipidemia.  He mentions to me that he plays golf.  Recently upon exertion he has been noticing substernal chest tightness on predictable exertion going to be jaw and the neck and the left arm.  This happens consistently with exercise and is relieved with rest it happened as late as yesterday when he was trying to exert himself at the church.  His wife accompanies him for the visit.  At the time of my evaluation, the patient is alert awake oriented and in no distress.  No orthopnea PND or syncope.  Past Medical History:  Diagnosis Date  . Gout   . Hearing loss   . Hypertension   . Reflux     Past Surgical History:  Procedure Laterality Date  . EYE SURGERY Bilateral 2013  . FOOT SURGERY Bilateral    ingrown toenails  . GALLBLADDER SURGERY    . SHOULDER SURGERY Right   . SPINE SURGERY      Current Medications: Current Meds  Medication Sig  .  albuterol (PROVENTIL HFA;VENTOLIN HFA) 108 (90 Base) MCG/ACT inhaler Inhale 2 puffs into the lungs every 6 (six) hours as needed for wheezing or shortness of breath.  . allopurinol (ZYLOPRIM) 300 MG tablet Take 1 tablet (300 mg total) by mouth daily.  [name]. aspirin 81 MG tablet Take 81 mg by mouth daily.  [name]. atorvastatin (LIPITOR) 20 MG tablet Take 1 tablet (20 mg total) by mouth daily at 6 PM.  . esomeprazole (NEXIUM) 40 MG capsule Take 1 capsule (40 mg total) by mouth daily before breakfast.  . fluticasone (FLONASE) 50 MCG/ACT nasal spray Place 2  sprays into both nostrils daily.  . meloxicam (MOBIC) 15 MG tablet Take 1 tablet (15 mg total) by mouth daily.  . valsartan (DIOVAN) 160 MG tablet Take 1 tablet (160 mg total) by mouth daily.     Allergies:   Codeine; Iodine; and Naprosyn [naproxen]   Social History   Socioeconomic History  . Marital status: Married    Spouse name: Not on file  . Number of children: Not on file  . Years of education: Not on file  . Highest education level: Not on file  Occupational History  . Not on file  Social Needs  . Financial resource strain: Not on file  . Food insecurity:    Worry: Not on file    Inability: Not on file  . Transportation needs:    Medical: Not on file    Non-medical: Not on file  Tobacco Use  . Smoking status: Never Smoker  . Smokeless tobacco: Never Used  Substance and Sexual Activity  . Alcohol use: Yes    Comment: rarely  . Drug use: No  . Sexual activity: Not on file  Lifestyle  . Physical activity:    Days per week: Not on file    Minutes per session: Not on file  . Stress: Not on file  Relationships  . Social connections:    Talks on phone: Not on file    Gets together: Not on file    Attends religious service: Not on file    Active member of club or organization: Not on file    Attends meetings of clubs or organizations: Not on file    Relationship status: Not on file  Other Topics Concern  . Not on file  Social History Narrative  . Not on file     Family History: The patient's family history includes Diabetes in his mother; Lung cancer in his father; Stroke in his paternal grandfather.  ROS:   Please see the history of present illness.    All other systems reviewed and are negative.  EKGs/Labs/Other Studies Reviewed:    The following studies were reviewed today: EKG reveals sinus rhythm and nonspecific ST-T changes   Recent Labs: 11/27/2016: Hemoglobin 16.3; Platelets 210; TSH 3.340 06/03/2017: ALT (SGPT) Piccolo, Waived 37; BUN 15;  Creatinine, Ser 1.04; Potassium 4.4; Sodium 142  Recent Lipid Panel    Component Value Date/Time   CHOL 142 06/03/2017 0811   TRIG 123 06/03/2017 0811   HDL 46 11/27/2016 1425   CHOLHDL 3.9 11/27/2016 1425   VLDL 25 06/03/2017 0811   LDLCALC 80 11/27/2016 1425    Physical Exam:    VS:  BP 138/86 (BP Location: Left Arm, Patient Position: Sitting, Cuff Size: Large)   Pulse 91   Ht 5\' 10"  (1.778 m)   Wt 216 lb 6.4 oz (98.2 kg)   SpO2 98%   BMI 31.05 kg/m  Wt Readings from Last 3 Encounters:  07/16/17 216 lb 6.4 oz (98.2 kg)  07/06/17 217 lb 12.8 oz (98.8 kg)  06/03/17 218 lb (98.9 kg)     GEN: Patient is in no acute distress HEENT: Normal NECK: No JVD; No carotid bruits LYMPHATICS: No lymphadenopathy CARDIAC: S1 S2 regular, 2/6 systolic murmur at the apex. RESPIRATORY:  Clear to auscultation without rales, wheezing or rhonchi  ABDOMEN: Soft, non-tender, non-distended MUSCULOSKELETAL:  No edema; No deformity  SKIN: Warm and dry NEUROLOGIC:  Alert and oriented x 3 PSYCHIATRIC:  Normal affect    Signed, Garwin Brothers, MD  07/16/2017 3:05 PM    [facility]

## 2017-07-16 NOTE — Patient Instructions (Addendum)
Medication Instructions:  Your physician has recommended you make the following change in your medication:  START Nitroglycerin 0.4 mg sublingual (under your tongue) as needed for chest pain. If experiencing chest pain, stop what you are doing and sit down. Take 1 nitroglycerin and wait 5 minutes. If chest pain continues, take another nitroglycerin and wait 5 minutes. If chest pain does not subside, take 1 more nitroglycerin and dial 911. You make take a total of 3 nitroglycerin in a 15 minute time frame.  Labwork: Your physician recommends that you have the following labs drawn: CBC and BMP today  Testing/Procedures:   [facility] [address], [address] [place] [address] Dept: [phone] Loc: [phone]  Thomas KavaLarry G Miller  07/16/2017  You are scheduled for a Cardiac Catheterization on Friday, June 21 with Dr. Cristal Deerhristopher End.  1. Please arrive [facility]: [address] [place], [place] [address] at 11:30 AM (two hours before your procedure to ensure your preparation). Free valet parking service is available.   Special note: Every effort is made to have your procedure done on time. Please understand that emergencies sometimes delay scheduled procedures.  2. Diet: You may have a clear liquid diet until 5 AM the morning of the heart cath.  3. Labs: Drawn today.  4. Medication instructions in preparation for your procedure:  Please hold valsartan the morning of the heart cath.  On the morning of your procedure, take your Aspirin and any morning medicines NOT listed above.  You may use sips of water.  5. Plan for one night stay--bring personal belongings. 6. Bring a current list of your medications and current insurance cards. 7. You MUST have a responsible person to drive you home. 8. Someone MUST be with you the first 24 hours after you arrive  home or your discharge will be delayed. 9. Please wear clothes that are easy to get on and off and wear slip-on shoes.  Thank you for allowing us to care for you!   -- [facility]   Follow-Up: Your physician recommends that you schedule a follow-up appointment in: 1 month  Any Other Special Instructions Will Be Listed Below (If Applicable).     If you need a refill on your cardiac medications before your next appointment, please call your pharmacy.   [facility]  Garey HamAshley A, RN, BSN   Coronary Angiogram With Stent Coronary angiogram with stent placement is a procedure to widen or open a narrow blood vessel of the heart (coronary artery). Arteries may become blocked by cholesterol buildup (plaques) in the lining of the wall. When a coronary artery becomes partially blocked, blood flow to that area decreases. This may lead to chest pain or a heart attack (myocardial infarction). A stent is a small piece of metal that looks like mesh or a spring. Stent placement may be done as treatment for a heart attack or right after a coronary angiogram in which a blocked artery is found. Let your health care provider know about:  Any allergies you have.  All medicines you are taking, including vitamins, herbs, eye drops, creams, and over-the-counter medicines.  Any problems you or family members have had with anesthetic medicines.  Any blood disorders you have.  Any surgeries you have had.  Any medical conditions you have.  Whether you are pregnant or may be pregnant. What are  the risks? Generally, this is a safe procedure. However, problems may occur, including:  Damage to the heart or its blood vessels.  A return of blockage.  Bleeding, infection, or bruising at the insertion site.  A collection of blood under the skin (hematoma) at the insertion site.  A blood clot in another part of the body.  Kidney injury.  Allergic reaction to the dye  or contrast that is used.  Bleeding into the abdomen (retroperitoneal bleeding).  What happens before the procedure? Staying hydrated Follow instructions from your health care provider about hydration, which may include:  Up to 2 hours before the procedure - you may continue to drink clear liquids, such as water, clear fruit juice, black coffee, and plain tea.  Eating and drinking restrictions Follow instructions from your health care provider about eating and drinking, which may include:  8 hours before the procedure - stop eating heavy meals or foods such as meat, fried foods, or fatty foods.  6 hours before the procedure - stop eating light meals or foods, such as toast or cereal.  2 hours before the procedure - stop drinking clear liquids.  Ask your health care provider about:  Changing or stopping your regular medicines. This is especially important if you are taking diabetes medicines or blood thinners.  Taking medicines such as ibuprofen. These medicines can thin your blood. Do not take these medicines before your procedure if your health care provider instructs you not to. Generally, aspirin is recommended before a procedure of passing a small, thin tube (catheter) through a blood vessel and into the heart (cardiac catheterization).  What happens during the procedure?  An IV tube will be inserted into one of your veins.  You will be given one or more of the following: ? A medicine to help you relax (sedative). ? A medicine to numb the area where the catheter will be inserted into an artery (local anesthetic).  To reduce your risk of infection: ? Your health care team will wash or sanitize their hands. ? Your skin will be washed with soap. ? Hair may be removed from the area where the catheter will be inserted.  Using a guide wire, the catheter will be inserted into an artery. The location may be in your groin, in your wrist, or in the fold of your arm (near your  elbow).  A type of X-ray (fluoroscopy) will be used to help guide the catheter to the opening of the arteries in the heart.  A dye will be injected into the catheter, and X-rays will be taken. The dye will help to show where any narrowing or blockages are located in the arteries.  A tiny wire will be guided to the blocked spot, and a balloon will be inflated to make the artery wider.  The stent will be expanded and will crush the plaques into the wall of the vessel. The stent will hold the area open and improve the blood flow. Most stents have a drug coating to reduce the risk of the stent narrowing over time.  The artery may be made wider using a drill, laser, or other tools to remove plaques.  When the blood flow is better, the catheter will be removed. The lining of the artery will grow over the stent, which stays where it was placed. This procedure may vary among health care providers and hospitals. What happens after the procedure?  If the procedure is done through the leg, you will be kept in  bed lying flat for about 6 hours. You will be instructed to not bend and not cross your legs.  The insertion site will be checked frequently.  The pulse in your foot or wrist will be checked frequently.  You may have additional blood tests, X-rays, and a test that records the electrical activity of your heart (electrocardiogram, or ECG). This information is not intended to replace advice given to you by your health care provider. Make sure you discuss any questions you have with your health care provider. Document Released: 07/20/2002 Document Revised: 09/13/2015 Document Reviewed: 08/19/2015 Elsevier Interactive Patient Education  2018 [employer]. Nitroglycerin sublingual tablets What is this medicine? NITROGLYCERIN (nye troe GLI ser in) is a type of vasodilator. It relaxes blood vessels, increasing the blood and oxygen supply to your heart. This medicine is used to relieve chest pain  caused by angina. It is also used to prevent chest pain before activities like climbing stairs, going outdoors in cold weather, or sexual activity. This medicine may be used for other purposes; ask your health care provider or pharmacist if you have questions. COMMON BRAND NAME(S): Nitroquick, Nitrostat, Nitrotab What should I tell my health care provider before I take this medicine? They need to know if you have any of these conditions: -anemia -head injury, recent stroke, or bleeding in the brain -liver disease -previous heart attack -an unusual or allergic reaction to nitroglycerin, other medicines, foods, dyes, or preservatives -pregnant or trying to get pregnant -breast-feeding How should I use this medicine? Take this medicine by mouth as needed. At the first sign of an angina attack (chest pain or tightness) place one tablet under your tongue. You can also take this medicine 5 to 10 minutes before an event likely to produce chest pain. Follow the directions on the prescription label. Let the tablet dissolve under the tongue. Do not swallow whole. Replace the dose if you accidentally swallow it. It will help if your mouth is not dry. Saliva around the tablet will help it to dissolve more quickly. Do not eat or drink, smoke or chew tobacco while a tablet is dissolving. If you are not better within 5 minutes after taking ONE dose of nitroglycerin, call 9-1-1 immediately to seek emergency medical care. Do not take more than 3 nitroglycerin tablets over 15 minutes. If you take this medicine often to relieve symptoms of angina, your doctor or health care professional may provide you with different instructions to manage your symptoms. If symptoms do not go away after following these instructions, it is important to call 9-1-1 immediately. Do not take more than 3 nitroglycerin tablets over 15 minutes. Talk to your pediatrician regarding the use of this medicine in children. Special care may be  needed. Overdosage: If you think you have taken too much of this medicine contact a poison control center or emergency room at once. NOTE: This medicine is only for you. Do not share this medicine with others. What if I miss a dose? This does not apply. This medicine is only used as needed. What may interact with this medicine? Do not take this medicine with any of the following medications: -certain migraine medicines like ergotamine and dihydroergotamine (DHE) -medicines used to treat erectile dysfunction like sildenafil, tadalafil, and vardenafil -riociguat This medicine may also interact with the following medications: -alteplase -aspirin -heparin -medicines for high blood pressure -medicines for mental depression -other medicines used to treat angina -phenothiazines like chlorpromazine, mesoridazine, prochlorperazine, thioridazine This list may not describe all possible  interactions. Give your health care provider a list of all the medicines, herbs, non-prescription drugs, or dietary supplements you use. Also tell them if you smoke, drink alcohol, or use illegal drugs. Some items may interact with your medicine. What should I watch for while using this medicine? Tell your doctor or health care professional if you feel your medicine is no longer working. Keep this medicine with you at all times. Sit or lie down when you take your medicine to prevent falling if you feel dizzy or faint after using it. Try to remain calm. This will help you to feel better faster. If you feel dizzy, take several deep breaths and lie down with your feet propped up, or bend forward with your head resting between your knees. You may get drowsy or dizzy. Do not drive, use machinery, or do anything that needs mental alertness until you know how this drug affects you. Do not stand or sit up quickly, especially if you are an older patient. This reduces the risk of dizzy or fainting spells. Alcohol can make you more  drowsy and dizzy. Avoid alcoholic drinks. Do not treat yourself for coughs, colds, or pain while you are taking this medicine without asking your doctor or health care professional for advice. Some ingredients may increase your blood pressure. What side effects may I notice from receiving this medicine? Side effects that you should report to your doctor or health care professional as soon as possible: -blurred vision -dry mouth -skin rash -sweating -the feeling of extreme pressure in the head -unusually weak or tired Side effects that usually do not require medical attention (report to your doctor or health care professional if they continue or are bothersome): -flushing of the face or neck -headache -irregular heartbeat, palpitations -nausea, vomiting This list may not describe all possible side effects. Call your doctor for medical advice about side effects. You may report side effects to FDA at 1-800-FDA-1088. Where should I keep my medicine? Keep out of the reach of children. Store at room temperature between 20 and 25 degrees C (68 and 77 degrees F). Store in Retail buyer. Protect from light and moisture. Keep tightly closed. Throw away any unused medicine after the expiration date. NOTE: This sheet is a summary. It may not cover all possible information. If you have questions about this medicine, talk to your doctor, pharmacist, or health care provider.  2018 Elsevier/Gold Standard (2012-11-11 17:57:36)

## 2017-07-17 ENCOUNTER — Encounter (HOSPITAL_COMMUNITY)
Admission: RE | Disposition: A | Discharge status: Home / Self Care | Payer: Self-pay | Source: Ambulatory Visit | Attending: Cardiothoracic Surgery

## 2017-07-17 ENCOUNTER — Inpatient Hospital Stay (HOSPITAL_COMMUNITY): Payer: Medicare Other

## 2017-07-17 ENCOUNTER — Other Ambulatory Visit: Payer: Self-pay | Admitting: *Deleted

## 2017-07-17 ENCOUNTER — Inpatient Hospital Stay (HOSPITAL_COMMUNITY)
Admission: RE | Admit: 2017-07-17 | Discharge: 2017-07-27 | DRG: 234 | Disposition: A | Discharge status: Home / Self Care | Payer: Medicare Other | Source: Ambulatory Visit | Attending: Cardiothoracic Surgery | Admitting: Cardiothoracic Surgery

## 2017-07-17 DIAGNOSIS — Z791 Long term (current) use of non-steroidal anti-inflammatories (NSAID): Secondary | ICD-10-CM | POA: Diagnosis not present

## 2017-07-17 DIAGNOSIS — E785 Hyperlipidemia, unspecified: Secondary | ICD-10-CM | POA: Diagnosis not present

## 2017-07-17 DIAGNOSIS — Z888 Allergy status to other drugs, medicaments and biological substances status: Secondary | ICD-10-CM | POA: Diagnosis not present

## 2017-07-17 DIAGNOSIS — Z951 Presence of aortocoronary bypass graft: Secondary | ICD-10-CM

## 2017-07-17 DIAGNOSIS — I200 Unstable angina: Secondary | ICD-10-CM | POA: Diagnosis not present

## 2017-07-17 DIAGNOSIS — H9190 Unspecified hearing loss, unspecified ear: Secondary | ICD-10-CM | POA: Diagnosis present

## 2017-07-17 DIAGNOSIS — Z7951 Long term (current) use of inhaled steroids: Secondary | ICD-10-CM | POA: Diagnosis not present

## 2017-07-17 DIAGNOSIS — E7800 Pure hypercholesterolemia, unspecified: Secondary | ICD-10-CM | POA: Diagnosis not present

## 2017-07-17 DIAGNOSIS — E782 Mixed hyperlipidemia: Secondary | ICD-10-CM | POA: Diagnosis not present

## 2017-07-17 DIAGNOSIS — N400 Enlarged prostate without lower urinary tract symptoms: Secondary | ICD-10-CM | POA: Diagnosis present

## 2017-07-17 DIAGNOSIS — I209 Angina pectoris, unspecified: Secondary | ICD-10-CM

## 2017-07-17 DIAGNOSIS — Z01810 Encounter for preprocedural cardiovascular examination: Secondary | ICD-10-CM | POA: Diagnosis not present

## 2017-07-17 DIAGNOSIS — I2510 Atherosclerotic heart disease of native coronary artery without angina pectoris: Secondary | ICD-10-CM | POA: Diagnosis not present

## 2017-07-17 DIAGNOSIS — D62 Acute posthemorrhagic anemia: Secondary | ICD-10-CM | POA: Diagnosis not present

## 2017-07-17 DIAGNOSIS — I480 Paroxysmal atrial fibrillation: Secondary | ICD-10-CM | POA: Diagnosis not present

## 2017-07-17 DIAGNOSIS — K219 Gastro-esophageal reflux disease without esophagitis: Secondary | ICD-10-CM | POA: Diagnosis not present

## 2017-07-17 DIAGNOSIS — I481 Persistent atrial fibrillation: Secondary | ICD-10-CM | POA: Diagnosis not present

## 2017-07-17 DIAGNOSIS — G4700 Insomnia, unspecified: Secondary | ICD-10-CM | POA: Diagnosis not present

## 2017-07-17 DIAGNOSIS — M109 Gout, unspecified: Secondary | ICD-10-CM | POA: Diagnosis not present

## 2017-07-17 DIAGNOSIS — Z886 Allergy status to analgesic agent status: Secondary | ICD-10-CM | POA: Diagnosis not present

## 2017-07-17 DIAGNOSIS — R0989 Other specified symptoms and signs involving the circulatory and respiratory systems: Secondary | ICD-10-CM | POA: Diagnosis not present

## 2017-07-17 DIAGNOSIS — Z885 Allergy status to narcotic agent status: Secondary | ICD-10-CM

## 2017-07-17 DIAGNOSIS — Z79899 Other long term (current) drug therapy: Secondary | ICD-10-CM

## 2017-07-17 DIAGNOSIS — R079 Chest pain, unspecified: Secondary | ICD-10-CM

## 2017-07-17 DIAGNOSIS — E119 Type 2 diabetes mellitus without complications: Secondary | ICD-10-CM | POA: Diagnosis not present

## 2017-07-17 DIAGNOSIS — I5030 Unspecified diastolic (congestive) heart failure: Secondary | ICD-10-CM

## 2017-07-17 DIAGNOSIS — J9811 Atelectasis: Secondary | ICD-10-CM | POA: Diagnosis not present

## 2017-07-17 DIAGNOSIS — Z7982 Long term (current) use of aspirin: Secondary | ICD-10-CM | POA: Diagnosis not present

## 2017-07-17 DIAGNOSIS — I25110 Atherosclerotic heart disease of native coronary artery with unstable angina pectoris: Secondary | ICD-10-CM | POA: Diagnosis not present

## 2017-07-17 DIAGNOSIS — I4891 Unspecified atrial fibrillation: Secondary | ICD-10-CM

## 2017-07-17 DIAGNOSIS — I10 Essential (primary) hypertension: Secondary | ICD-10-CM | POA: Diagnosis present

## 2017-07-17 DIAGNOSIS — Z4682 Encounter for fitting and adjustment of non-vascular catheter: Secondary | ICD-10-CM | POA: Diagnosis not present

## 2017-07-17 DIAGNOSIS — R0602 Shortness of breath: Secondary | ICD-10-CM | POA: Diagnosis not present

## 2017-07-17 DIAGNOSIS — Z09 Encounter for follow-up examination after completed treatment for conditions other than malignant neoplasm: Secondary | ICD-10-CM

## 2017-07-17 HISTORY — PX: LEFT HEART CATH AND CORONARY ANGIOGRAPHY: CATH118249

## 2017-07-17 LAB — CBC WITH DIFFERENTIAL/PLATELET
Basophils Absolute: 0 10*3/uL (ref 0.0–0.2)
Basos: 0 %
EOS (ABSOLUTE): 0.2 10*3/uL (ref 0.0–0.4)
Eos: 2 %
Hematocrit: 47.6 % (ref 37.5–51.0)
Hemoglobin: 16.5 g/dL (ref 13.0–17.7)
Immature Grans (Abs): 0 10*3/uL (ref 0.0–0.1)
Immature Granulocytes: 0 %
Lymphocytes Absolute: 2.4 10*3/uL (ref 0.7–3.1)
Lymphs: 23 %
MCH: 29.9 pg (ref 26.6–33.0)
MCHC: 34.7 g/dL (ref 31.5–35.7)
MCV: 86 fL (ref 79–97)
Monocytes Absolute: 0.9 10*3/uL (ref 0.1–0.9)
Monocytes: 9 %
Neutrophils Absolute: 6.9 10*3/uL (ref 1.4–7.0)
Neutrophils: 66 %
Platelets: 241 10*3/uL (ref 150–450)
RBC: 5.52 x10E6/uL (ref 4.14–5.80)
RDW: 14.4 % (ref 12.3–15.4)
WBC: 10.5 10*3/uL (ref 3.4–10.8)

## 2017-07-17 LAB — SURGICAL PCR SCREEN
MRSA, PCR: NEGATIVE
Staphylococcus aureus: POSITIVE — AB

## 2017-07-17 LAB — BASIC METABOLIC PANEL
BUN/Creatinine Ratio: 13 (ref 10–24)
BUN: 13 mg/dL (ref 8–27)
CO2: 24 mmol/L (ref 20–29)
Calcium: 10.1 mg/dL (ref 8.6–10.2)
Chloride: 102 mmol/L (ref 96–106)
Creatinine, Ser: 1.01 mg/dL (ref 0.76–1.27)
GFR calc Af Amer: 89 mL/min/{1.73_m2} (ref >59)
GFR calc non Af Amer: 77 mL/min/{1.73_m2} (ref >59)
Glucose: 91 mg/dL (ref 65–99)
Potassium: 4.7 mmol/L (ref 3.5–5.2)
Sodium: 143 mmol/L (ref 134–144)

## 2017-07-17 SURGERY — LEFT HEART CATH AND CORONARY ANGIOGRAPHY
Anesthesia: LOCAL

## 2017-07-17 MED ORDER — SODIUM CHLORIDE 0.9 % IV SOLN
250.0000 mL | INTRAVENOUS | Status: DC | PRN
  Administered 2017-07-17: 10 mL via INTRAVENOUS
  Administered 2017-07-20: 19:00:00 via INTRAVENOUS

## 2017-07-17 MED ORDER — HEPARIN (PORCINE) IN NACL 2-0.9 UNITS/ML
INTRAMUSCULAR | Status: AC | PRN
  Administered 2017-07-17: 1000 mL

## 2017-07-17 MED ORDER — DIPHENHYDRAMINE HCL 50 MG/ML IJ SOLN
INTRAMUSCULAR | Status: AC
  Administered 2017-07-17: 12:00:00
  Filled 2017-07-17: qty 1

## 2017-07-17 MED ORDER — HEPARIN (PORCINE) IN NACL 100-0.45 UNIT/ML-% IJ SOLN
1300.0000 [IU]/h | INTRAMUSCULAR | Status: DC
  Administered 2017-07-17 – 2017-07-20 (×4): 1300 [IU]/h via INTRAVENOUS
  Filled 2017-07-17 (×4): qty 250

## 2017-07-17 MED ORDER — PERFLUTREN LIPID MICROSPHERE
1.0000 mL | INTRAVENOUS | Status: AC | PRN
  Administered 2017-07-17: 2 mL via INTRAVENOUS
  Filled 2017-07-17: qty 10

## 2017-07-17 MED ORDER — ACETAMINOPHEN 325 MG PO TABS
650.0000 mg | ORAL_TABLET | ORAL | Status: DC | PRN
  Administered 2017-07-17 – 2017-07-19 (×4): 650 mg via ORAL
  Filled 2017-07-17 (×4): qty 2

## 2017-07-17 MED ORDER — HEPARIN SODIUM (PORCINE) 1000 UNIT/ML IJ SOLN
INTRAMUSCULAR | Status: DC | PRN
  Administered 2017-07-17: 5000 [IU] via INTRAVENOUS

## 2017-07-17 MED ORDER — LIDOCAINE HCL (PF) 1 % IJ SOLN
INTRAMUSCULAR | Status: DC | PRN
  Administered 2017-07-17: 2 mL

## 2017-07-17 MED ORDER — FENTANYL CITRATE (PF) 100 MCG/2ML IJ SOLN
INTRAMUSCULAR | Status: DC | PRN
  Administered 2017-07-17: 50 ug via INTRAVENOUS

## 2017-07-17 MED ORDER — DIPHENHYDRAMINE HCL 50 MG/ML IJ SOLN
25.0000 mg | Freq: Once | INTRAMUSCULAR | Status: AC
  Administered 2017-07-17: 25 mg via INTRAVENOUS

## 2017-07-17 MED ORDER — IOHEXOL 350 MG/ML SOLN
INTRAVENOUS | Status: DC | PRN
  Administered 2017-07-17: 65 mL via INTRA_ARTERIAL

## 2017-07-17 MED ORDER — SODIUM CHLORIDE 0.9% FLUSH: 3.0000 mL | INTRAVENOUS | Status: DC | PRN

## 2017-07-17 MED ORDER — PANTOPRAZOLE SODIUM 40 MG PO TBEC
40.0000 mg | DELAYED_RELEASE_TABLET | Freq: Every day | ORAL | Status: DC
  Administered 2017-07-18 – 2017-07-20 (×3): 40 mg via ORAL
  Filled 2017-07-17 (×3): qty 1

## 2017-07-17 MED ORDER — SODIUM CHLORIDE 0.9 % WEIGHT BASED INFUSION
3.0000 mL/kg/h | INTRAVENOUS | Status: DC
  Administered 2017-07-17: 3 mL/kg/h via INTRAVENOUS

## 2017-07-17 MED ORDER — ASPIRIN 81 MG PO CHEW: 81.0000 mg | CHEWABLE_TABLET | ORAL | Status: DC

## 2017-07-17 MED ORDER — LIDOCAINE HCL (PF) 1 % IJ SOLN
INTRAMUSCULAR | Status: AC
  Filled 2017-07-17: qty 30

## 2017-07-17 MED ORDER — ALLOPURINOL 300 MG PO TABS
300.0000 mg | ORAL_TABLET | Freq: Every day | ORAL | Status: DC
  Administered 2017-07-17 – 2017-07-20 (×4): 300 mg via ORAL
  Filled 2017-07-17 (×4): qty 1

## 2017-07-17 MED ORDER — NITROGLYCERIN IN D5W 200-5 MCG/ML-% IV SOLN
INTRAVENOUS | Status: AC
  Filled 2017-07-17: qty 250

## 2017-07-17 MED ORDER — SODIUM CHLORIDE 0.9 % WEIGHT BASED INFUSION: 1.0000 mL/kg/h | INTRAVENOUS | Status: DC

## 2017-07-17 MED ORDER — SODIUM CHLORIDE 0.9 % IV SOLN: 250.0000 mL | INTRAVENOUS | Status: DC | PRN

## 2017-07-17 MED ORDER — MIDAZOLAM HCL 2 MG/2ML IJ SOLN
INTRAMUSCULAR | Status: AC
  Filled 2017-07-17: qty 2

## 2017-07-17 MED ORDER — SODIUM CHLORIDE 0.9% FLUSH: 3.0000 mL | Freq: Two times a day (BID) | INTRAVENOUS | Status: DC

## 2017-07-17 MED ORDER — FLUTICASONE PROPIONATE 50 MCG/ACT NA SUSP: 2.0000 | Freq: Every day | NASAL | Status: DC

## 2017-07-17 MED ORDER — VERAPAMIL HCL 2.5 MG/ML IV SOLN
INTRAVENOUS | Status: AC
  Filled 2017-07-17: qty 2

## 2017-07-17 MED ORDER — ONDANSETRON HCL 4 MG/2ML IJ SOLN: 4.0000 mg | Freq: Four times a day (QID) | INTRAMUSCULAR | Status: DC | PRN

## 2017-07-17 MED ORDER — NITROGLYCERIN 0.4 MG SL SUBL
SUBLINGUAL_TABLET | SUBLINGUAL | Status: AC
  Filled 2017-07-17: qty 1

## 2017-07-17 MED ORDER — METHYLPREDNISOLONE SODIUM SUCC 125 MG IJ SOLR
125.0000 mg | Freq: Once | INTRAMUSCULAR | Status: AC
  Administered 2017-07-17: 125 mg via INTRAVENOUS

## 2017-07-17 MED ORDER — HEPARIN SODIUM (PORCINE) 1000 UNIT/ML IJ SOLN
INTRAMUSCULAR | Status: AC
  Filled 2017-07-17: qty 1

## 2017-07-17 MED ORDER — HEPARIN (PORCINE) IN NACL 2-0.9 UNITS/ML
INTRAMUSCULAR | Status: DC | PRN
  Administered 2017-07-17: 10 mL via INTRA_ARTERIAL

## 2017-07-17 MED ORDER — NITROGLYCERIN 1 MG/10 ML FOR IR/CATH LAB
INTRA_ARTERIAL | Status: AC
  Filled 2017-07-17: qty 10

## 2017-07-17 MED ORDER — NITROGLYCERIN 0.4 MG SL SUBL
0.4000 mg | SUBLINGUAL_TABLET | SUBLINGUAL | Status: DC | PRN
  Administered 2017-07-19: 0.4 mg via SUBLINGUAL
  Filled 2017-07-17: qty 1

## 2017-07-17 MED ORDER — METHYLPREDNISOLONE SODIUM SUCC 125 MG IJ SOLR
INTRAMUSCULAR | Status: AC
  Administered 2017-07-17: 12:00:00
  Filled 2017-07-17: qty 2

## 2017-07-17 MED ORDER — ASPIRIN EC 81 MG PO TBEC
81.0000 mg | DELAYED_RELEASE_TABLET | Freq: Every day | ORAL | Status: DC
  Administered 2017-07-18 – 2017-07-20 (×3): 81 mg via ORAL
  Filled 2017-07-17 (×3): qty 1

## 2017-07-17 MED ORDER — FENTANYL CITRATE (PF) 100 MCG/2ML IJ SOLN
INTRAMUSCULAR | Status: AC
  Filled 2017-07-17: qty 2

## 2017-07-17 MED ORDER — NITROGLYCERIN 1 MG/10 ML FOR IR/CATH LAB
INTRA_ARTERIAL | Status: DC | PRN
  Administered 2017-07-17: 200 ug via INTRACORONARY

## 2017-07-17 MED ORDER — NITROGLYCERIN 0.4 MG SL SUBL
SUBLINGUAL_TABLET | SUBLINGUAL | Status: DC | PRN
  Administered 2017-07-17: .4 mg via SUBLINGUAL

## 2017-07-17 MED ORDER — NITROGLYCERIN IN D5W 200-5 MCG/ML-% IV SOLN
INTRAVENOUS | Status: AC | PRN
  Administered 2017-07-17: 5 ug/min via INTRAVENOUS

## 2017-07-17 MED ORDER — SODIUM CHLORIDE 0.9 % IV SOLN: INTRAVENOUS | Status: AC

## 2017-07-17 MED ORDER — ALBUTEROL SULFATE (2.5 MG/3ML) 0.083% IN NEBU: 2.5000 mg | INHALATION_SOLUTION | Freq: Four times a day (QID) | RESPIRATORY_TRACT | Status: DC | PRN

## 2017-07-17 MED ORDER — MIDAZOLAM HCL 2 MG/2ML IJ SOLN
INTRAMUSCULAR | Status: DC | PRN
  Administered 2017-07-17: 1 mg via INTRAVENOUS

## 2017-07-17 MED ORDER — ATORVASTATIN CALCIUM 80 MG PO TABS
80.0000 mg | ORAL_TABLET | Freq: Every day | ORAL | Status: DC
  Administered 2017-07-17 – 2017-07-26 (×9): 80 mg via ORAL
  Filled 2017-07-17 (×9): qty 1

## 2017-07-17 MED ORDER — SODIUM CHLORIDE 0.9% FLUSH
3.0000 mL | Freq: Two times a day (BID) | INTRAVENOUS | Status: DC
  Administered 2017-07-18 – 2017-07-19 (×2): 3 mL via INTRAVENOUS

## 2017-07-17 MED ORDER — SODIUM CHLORIDE 0.9% FLUSH
3.0000 mL | INTRAVENOUS | Status: DC | PRN
  Administered 2017-07-18: 3 mL via INTRAVENOUS
  Filled 2017-07-17: qty 3

## 2017-07-17 SURGICAL SUPPLY — 10 items

## 2017-07-17 NOTE — Brief Op Note (Signed)
BRIEF CARDIAC CATHETERIZATION NOTE  DATE: 07/17/2017 TIME: 3:34 PM  PATIENT:  Adriana MccallumLarry G Heydt  67 y.o. male  PRE-OPERATIVE DIAGNOSIS:  Unstable angina  POST-OPERATIVE DIAGNOSIS:  Same  PROCEDURE:  Procedure(s): LEFT HEART CATH AND CORONARY ANGIOGRAPHY (N/A)  SURGEON:  Surgeon(s) and Role:    * Kree Armato, MD - Primary  FINDINGS: 1. 95% ostial LAD stenosis with faint right-to-left. 2. 80% distal RCA and 95% mid rPDA lesions. 3. Moderate diffuse LMCA and LCx disease. 4. Low normal LVEF with normal LVEDP (following NTG administration).  RECOMMENDATIONS: 1. Surgical consultation for CABG.  Given questionable LCx and RCA targets, a heart team approach to consider hybrid revascularization with LIMA-LAD and PCI to RCA/PDA should be considered. 2. Given intermittent chest pain during procedure, will admit to stepdown with NTG gtt and heparin gtt. 3. Obtain echo to evaluate for other structural abnormalities.  Yvonne Kendallhristopher Rio Kidane, MD [facility] Pager: [phone]

## 2017-07-17 NOTE — Progress Notes (Signed)
ANTICOAGULATION CONSULT NOTE - Initial Consult  Pharmacy Consult for heparin Indication: ACS > possible CABG  Allergies  Allergen Reactions  . Codeine Other (See Comments)    Insomnia  . Iodine Rash  . Naprosyn [Naproxen] Rash    Patient Measurements: Height: 5\' 10"  (177.8 cm) Weight: 216 lb (98 kg) IBW/kg (Calculated) : 73 Heparin Dosing Weight: 93 kg  Vital Signs: Temp: 97.9 F (36.6 C) (06/21 1102) Temp Source: Oral (06/21 1102) BP: 136/79 (06/21 1650) Pulse Rate: 96 (06/21 1650)  Labs: Recent Labs    07/16/17 1450  HGB 16.5  HCT 47.6  PLT 241  CREATININE 1.01      Medical History: Past Medical History:  Diagnosis Date  . Gout   . Hearing loss   . Hypertension   . Reflux      Assessment: 67 yo male admitted with unstable angina. Now s/p cath today, found multi-vessel disease, consulting surgeons to evaluate patient for possible CABG. Pharmacy consulted to bridge to heparin in the meantime. Sheath was removed at 1520.  Goal of Therapy:  Heparin level 0.3-0.7 units/ml Monitor platelets by anticoagulation protocol: Yes   Plan:  -Heparin infusion at 1300 units/hr, beginning at 1930 -Daily HL, CBC -First level with AM labs   Baldemar FridayMasters, Sabien Umland M 07/17/2017,5:26 PM

## 2017-07-17 NOTE — Consult Note (Addendum)
[address]       [place] [address]             [phone]        Thomas Miller [facility] Medical Record #[id number] Date of Birth: Mar 01, 1950  Referring: End Primary Care: Steele Sizer, MD Primary Cardiologist: Raven[place]  Chief Complaint:  CAD  History of Present Illness:      Thomas Miller is a 67 yo male with known history of Hyperlipidemia and Hypertension.  The patient recently developed complaints of substernal chest tightness.  This occurred with exertion with radiation into the jaw, neck, and left arm.  These episodes have been happening consistently with exercise and relief with rest.  He presented to his Primary care physicians office who referred the patient for cardiac evaluation.  He was evaluated by Dr. Henrietta Hoover who felt cardiac catheterization would be indicated.  This was performed today by Dr. Okey Dupre and revealed disease of his LAD and RCA.  It was felt coronary bypass grafting may be indicated with a possible hybrid approach.  He was placed on NTG and Heparin due to chest pain experienced during his catheterization.  Cardiothoracic surgery consultation has been requested.  Currently the patient is chest pain free.  Current Activity/ Functional Status: Patient is independent with mobility/ambulation, transfers, ADL's, IADL's.   Zubrod Score: At the time of surgery this patient's most appropriate activity status/level should be described as: []     0    Normal activity, no symptoms [x]     1    Restricted in physical strenuous activity but ambulatory, able to do out light work []     2    Ambulatory and capable of self care, unable to do work activities, up and about                 more than 50%  Of the time                            []     3    Only limited self care, in bed greater than 50% of waking hours []     4    Completely disabled, no self care, confined to bed or chair []     5    Moribund  Past Medical History:  Diagnosis Date  . Gout     . Hearing loss   . Hypertension   . Reflux     Past Surgical History:  Procedure Laterality Date  . EYE SURGERY Bilateral 2013  . FOOT SURGERY Bilateral    ingrown toenails  . GALLBLADDER SURGERY    . SHOULDER SURGERY Right   . SPINE SURGERY      Social History   Tobacco Use  Smoking Status Never Smoker  Smokeless Tobacco Never Used    Social History   Substance and Sexual Activity  Alcohol Use Yes   Comment: rarely     Allergies  Allergen Reactions  . Codeine Other (See Comments)    Insomnia  . Iodine Rash  . Naprosyn [Naproxen] Rash    Current Facility-Administered Medications  Medication Dose Route Frequency Provider Last Rate Last Dose  . 0.9 %  sodium chloride infusion  250 mL Intravenous PRN Revankar, Aundra Dubin, MD      . 0.9 %  sodium chloride infusion   Intravenous Continuous End, Christopher, MD 75 mL/hr at 07/17/17 1538    . 0.9%  sodium chloride infusion  1 mL/kg/hr Intravenous Continuous Revankar, Aundra Dubin, MD      . Melene Muller ON 07/18/2017] aspirin chewable tablet 81 mg  81 mg Oral Pre-Cath Revankar, Aundra Dubin, MD      . diphenhydrAMINE (BENADRYL) 50 MG/ML injection           . methylPREDNISolone sodium succinate (SOLU-MEDROL) 125 mg/2 mL injection           . sodium chloride flush (NS) 0.9 % injection 3 mL  3 mL Intravenous Q12H Revankar, Rajan R, MD      . sodium chloride flush (NS) 0.9 % injection 3 mL  3 mL Intravenous PRN Revankar, Aundra Dubin, MD        Medications Prior to Admission  Medication Sig Dispense Refill Last Dose  . albuterol (PROVENTIL HFA;VENTOLIN HFA) 108 (90 Base) MCG/ACT inhaler Inhale 2 puffs into the lungs every 6 (six) hours as needed for wheezing or shortness of breath. 1 Inhaler 2 Past Month at Unknown time  . allopurinol (ZYLOPRIM) 300 MG tablet Take 1 tablet (300 mg total) by mouth daily. 90 tablet 4 07/16/2017 at Unknown time  . aspirin 81 MG tablet Take 81 mg by mouth daily.   07/17/2017 at 0545  . atorvastatin (LIPITOR) 20 MG  tablet Take 1 tablet (20 mg total) by mouth daily at 6 PM. 90 tablet 4 07/16/2017 at Unknown time  . esomeprazole (NEXIUM) 40 MG capsule Take 1 capsule (40 mg total) by mouth daily before breakfast. 90 capsule 4 07/17/2017 at 0545  . meloxicam (MOBIC) 15 MG tablet Take 1 tablet (15 mg total) by mouth daily. (Patient taking differently: Take 15 mg by mouth daily as needed for pain. ) 90 tablet 3 Past Month at Unknown time  . Multiple Vitamin (MULTIVITAMIN) tablet Take 1 tablet by mouth daily.   07/16/2017 at Unknown time  . nitroGLYCERIN (NITROSTAT) 0.4 MG SL tablet Place 1 tablet (0.4 mg total) under the tongue every 5 (five) minutes as needed. 25 tablet 11 07/17/2017 at 1015  . valsartan (DIOVAN) 160 MG tablet Take 1 tablet (160 mg total) by mouth daily. 90 tablet 4 07/16/2017 at Unknown time  . fluticasone (FLONASE) 50 MCG/ACT nasal spray Place 2 sprays into both nostrils daily.   More than a month at Unknown time    Family History  Problem Relation Age of Onset  . Diabetes Mother   . Lung cancer Father   . Stroke Paternal Grandfather    Review of Systems:   ROS Constitutional: negative for chills, fevers, night sweats and weight loss Respiratory: positive for exertional shortness of breath Cardiovascular: positive for exertional chest tightness Gastrointestinal: negative Integument/breast: negative Hematologic/lymphatic: negative Neurological: negative     Cardiac Review of Systems: Y or  [    ]= no  Chest Pain [ Y, exertional   ]  Resting SOB [   ] Exertional SOB  [Y  ]  Orthopnea [  ]   Pedal Edema [ N  ]    Palpitations Klaus.Mock  ] Syncope  [  ]   Presyncope [   ]  General Review of Systems: [Y] = yes [  ]=no Constitional: recent weight change [  ]; anorexia [  ]; fatigue Klaus.Mock  ]; nausea [  ]; night sweats [  ]; fever [  ]; or chills [  ]  Dental: Last Dentist visit:   Eye : blurred vision [  ]; diplopia [   ]; vision changes [  ];   Amaurosis fugax[  ]; Resp: cough [ N ];  wheezing[ N ];  hemoptysis[  ]; shortness of breath[  ]; paroxysmal nocturnal dyspnea[  ]; dyspnea on exertion[Y  ]; or orthopnea[  ];  GI:  gallstones[  ], vomiting[N  ];  dysphagia[  ]; melena[  ];  hematochezia [  ]; heartburn[  ];   Hx of  Colonoscopy[  ]; GU: kidney stones [  ]; hematuria[  ];   dysuria [  ];  nocturia[  ];  history of     obstruction [  ]; urinary frequency [  ]             Skin: rash, swelling[ N ];, hair loss[  ];  peripheral edema[N  ];  or itching[  ]; Musculosketetal: myalgias[  ];  joint swelling[  ];  joint erythema[  ];  joint pain[  ];  back pain[  ];  Heme/Lymph: bruising[  ];  bleeding[  ];  anemia[  ];  Neuro: Deyanira.Kussmaul  ];  headaches[  ];  stroke[  ];  vertigo[  ];  seizures[  ];   paresthesias[  ];  difficulty walking[  ];  Psych:depression[  ]; anxiety[  ];  Endocrine: diabetes[ N ];  thyroid dysfunction[N  ];  Physical Exam: BP 124/62   Pulse 91   Temp 97.9 F (36.6 C) (Oral)   Resp 12   Ht 5\' 10"  (1.778 m)   Wt 216 lb (98 kg)   SpO2 93%   BMI 30.99 kg/m   General appearance: alert, cooperative and no distress Head: Normocephalic, without obvious abnormality, atraumatic Resp: clear to auscultation bilaterally Cardio: regular rate and rhythm GI: soft, non-tender; bowel sounds normal; no masses,  no organomegaly Genitalia: defer exam Extremities: extremities normal, atraumatic, no cyanosis or edema Neurologic: Grossly normal  Diagnostic Studies & Laboratory data:  1. 95% ostial LAD stenosis with faint right-to-left. 2. 80% distal RCA and 95% mid rPDA lesions. 3. Moderate diffuse LMCA and LCx disease. 4. Low normal LVEF with normal LVEDP (following NTG administration).  I have independently reviewed the above radiologic studies and discussed with the patient   Recent Lab Findings: Lab Results  Component Value Date   WBC 10.5 07/16/2017   HGB 16.5 07/16/2017   HCT 47.6 07/16/2017   PLT 241  07/16/2017   GLUCOSE 91 07/16/2017   CHOL 142 06/03/2017   TRIG 123 06/03/2017   HDL 46 11/27/2016   LDLCALC 80 11/27/2016   ALT 37 06/03/2017   AST 35 06/03/2017   NA 143 07/16/2017   K 4.7 07/16/2017   CL 102 07/16/2017   CREATININE 1.01 07/16/2017   BUN 13 07/16/2017   CO2 24 07/16/2017   TSH 3.340 11/27/2016   Assessment / Plan:    1. CAD, consulted for possible CABG.. Patient on Heparin and NTG drip- Dr. Cornelius Moras is aware of patient and will follow up with further recommendations and timing of surgery. 2. Dyslipidemia- continue statin therapy  Lowella Dandy PA-C  07/17/2017 4:23 PM    Transthoracic Echocardiography  Patient:    Joedy, Eickhoff MR #:       [id number] Study Date: 07/17/2017 Gender:     M Age:        52 Height:     177.8 cm Weight:     98 kg BSA:  2.23 m^2 Pt. Status: Room:       2C06C   ADMITTING    Yvonne Kendall, MD  ATTENDING    Yvonne Kendall, MD  ORDERING     Yvonne Kendall, MD  SONOGRAPHER  Delcie Roch, RDCS, CCT  PERFORMING   [facility], Inpatient  cc:  ------------------------------------------------------------------- LV EF: 55% -   60%  ------------------------------------------------------------------- Indications:      CAD of native vessels 414.01.  ------------------------------------------------------------------- History:   PMH:   Angina pectoris.  Risk factors:  Hypertension. Dyslipidemia.  ------------------------------------------------------------------- Study Conclusions  - Left ventricle: The cavity size was normal. There was mild   concentric hypertrophy. Systolic function was normal. The   estimated ejection fraction was in the range of 55% to 60%. Wall   motion was normal; there were no regional wall motion   abnormalities. Doppler parameters are consistent with abnormal   left ventricular relaxation (grade 1 diastolic dysfunction).   Acoustic contrast opacification revealed no evidence  ofthrombus.  ------------------------------------------------------------------- Study data:  No prior study was available for comparison.  Study status:  Routine.  Procedure:  The patient reported no pain pre or post test. Transthoracic echocardiography. Image quality was poor. Intravenous contrast (Definity) was administered.  Study completion:  There were no complications.          Transthoracic echocardiography.  M-mode, complete 2D, spectral Doppler, and color Doppler.  Birthdate:  Patient birthdate: 09-13-1950.  Age:  Patient is 67 yr old.  Sex:  Gender: male.    BMI: 31 kg/m^2.  Blood pressure:     136/79  Patient status:  Inpatient.  Study date: Study date: 07/17/2017. Study time: 05:18 PM.  Location:  Bedside.   -------------------------------------------------------------------  ------------------------------------------------------------------- Left ventricle:  The cavity size was normal. There was mild concentric hypertrophy. Systolic function was normal. The estimated ejection fraction was in the range of 55% to 60%. Wall motion was normal; there were no regional wall motion abnormalities.  Acoustic contrast opacification revealed no evidence ofthrombus. Doppler parameters are consistent with abnormal left ventricular relaxation (grade 1 diastolic dysfunction).  ------------------------------------------------------------------- Aortic valve:   Trileaflet; normal thickness leaflets. Mobility was not restricted.  Doppler:  Transvalvular velocity was within the normal range. There was no stenosis. There was no regurgitation.   ------------------------------------------------------------------- Aorta:  Aortic root: The aortic root was mildly dilated.  ------------------------------------------------------------------- Mitral valve:   Structurally normal valve.   Mobility was not restricted.  Doppler:  Transvalvular velocity was within the normal range. There was no  evidence for stenosis. There was no regurgitation.    Peak gradient (D): 2 mm Hg.  ------------------------------------------------------------------- Left atrium:  The atrium was at the upper limits of normal in size.   ------------------------------------------------------------------- Right ventricle:  The cavity size was normal. Wall thickness was normal. Systolic function was normal.  ------------------------------------------------------------------- Pulmonic valve:   Poorly visualized.  The valve appears to be grossly normal.    Doppler:  Transvalvular velocity was within the normal range. There was no evidence for stenosis.  ------------------------------------------------------------------- Tricuspid valve:   Structurally normal valve.    Doppler: Transvalvular velocity was within the normal range. There was no regurgitation.  ------------------------------------------------------------------- Pulmonary artery:   The main pulmonary artery was normal-sized. Systolic pressure could not be accurately estimated.  ------------------------------------------------------------------- Right atrium:  The atrium was normal in size.  ------------------------------------------------------------------- Pericardium:  There was no pericardial effusion.  ------------------------------------------------------------------- Systemic veins: Inferior vena cava: The vessel was normal in size.  ------------------------------------------------------------------- Measurements   Left ventricle  Value        Reference  LV ID, ED, PLAX chordal        (L)     41.5  mm     43 - 52  LV ID, ES, PLAX chordal                27.4  mm     23 - 38  LV fx shortening, PLAX chordal         34    %      >=29  LV PW thickness, ED                    13    mm     ----------  IVS/LV PW ratio, ED                    1.04         <=1.3  Stroke volume, 2D                      61     ml     ----------  Stroke volume/bsa, 2D                  27    ml/m^2 ----------  LV e&', lateral                         8.55  cm/s   ----------  LV E/e&', lateral                       8.62         ----------  LV e&', medial                          8.63  cm/s   ----------  LV E/e&', medial                        8.54         ----------  LV e&', average                         8.59  cm/s   ----------  LV E/e&', average                       8.58         ----------    Ventricular septum                     Value        Reference  IVS thickness, ED                      13.5  mm     ----------    LVOT                                   Value        Reference  LVOT ID, S                             21    mm     ----------  LVOT area  3.46  cm^2   ----------  LVOT mean velocity, S                  69.4  cm/s   ----------  LVOT VTI, S                            17.6  cm     ----------    Aorta                                  Value        Reference  Aortic root ID, ED                     43    mm     ----------  Ascending aorta ID, A-P, S             34    mm     ----------    Left atrium                            Value        Reference  LA ID, A-P, ES                         37    mm     ----------  LA ID/bsa, A-P                         1.66  cm/m^2 <=2.2  LA volume, S                           53.2  ml     ----------  LA volume/bsa, S                       23.9  ml/m^2 ----------  LA volume, ES, 1-p A4C                 41.2  ml     ----------  LA volume/bsa, ES, 1-p A4C             18.5  ml/m^2 ----------  LA volume, ES, 1-p A2C                 63.6  ml     ----------  LA volume/bsa, ES, 1-p A2C             28.6  ml/m^2 ----------    Mitral valve                           Value        Reference  Mitral E-wave peak velocity            73.7  cm/s   ----------  Mitral A-wave peak velocity            123   cm/s   ----------  Mitral peak gradient, D                 2     mm Hg  ----------  Mitral E/A ratio, peak  0.6          ----------    Right atrium                           Value        Reference  RA ID, S-I, ES, A4C                    45    mm     34 - 49  RA area, ES, A4C                       12.7  cm^2   8.3 - 19.5  RA volume, ES, A/L                     32.4  ml     ----------  RA volume/bsa, ES, A/L                 14.6  ml/m^2 ----------    Systemic veins                         Value        Reference  Estimated CVP                          3     mm Hg  ----------    Right ventricle                        Value        Reference  TAPSE                                  15.5  mm     ----------  RV s&', lateral, S                      15.2  cm/s   ----------  Legend: (L)  and  (H)  mark values outside specified reference range.  ------------------------------------------------------------------- Prepared and Electronically Authenticated by  Thurmon Fair, MD 2019-06-22T10:25:12    LEFT HEART CATH AND CORONARY ANGIOGRAPHY  Conclusion   Conclusions: 5. Severe multivessel coronary artery disease, as detailed below.  Most critical lesions are 95% ostial LAD stenosis and sequential 80% and 95% mid/distal RCA and rPDA stenoses. 6. Normal to mildly elevated left ventricular filling pressure. 7. Low normal left ventricular contraction with subtle inferior hypokinesis.  LVEF 50-55%.  Recommendations: 1. Due to intermittent angina during procedure relieved with sublingual and IV nitroglycerin, patient will be admitted to stepdown. 2. Cardiac surgery consultation for CABG evaluation.  His LAD disease is best suited to bypass.  However, given suboptimal targets for OM2 and RCA/rPDA, a hybrid approach with LIMA-LAD and PCI to RCA/rPDA may need to be considered.  Recommend Heart Team approach to optimize revascularization. 3. Aggressive secondary prevention, including high-intensity statin therapy. 4. Start heparin  infusion 4 hours after sheath removal. 5. Titrate nitroglycerin gtt for relief of chest pain. 6. Obtain transthoracic echocardiogram to assess for structural abnormalities.  Yvonne Kendall, MD [facility] Pager: [phone]   Indications   Unstable angina (HCC) [I20.0 (ICD-10-CM)]  Procedural Details/Technique   Technical Details Indication: 67 y.o. year-old man with history of hypertension, hyperlipidemia, and gout,  referred for further evaluation of chest pain over the last 3-4 week, now present with minimal activity, consistent with unstable angina.  GFR: >60 ml/min  Procedure: The risks, benefits, complications, treatment options, and expected outcomes were discussed with the patient. The patient and/or family concurred with the proposed plan, giving informed consent. The patient was brought to the cath lab after IV hydration was begun and oral premedication was given. The patient was further sedated with Versed and Fentanyl. The right wrist was assessed with a modified Allens test which was normal. The right wrist was prepped and draped in a sterile fashion. 1% lidocaine was used for local anesthesia. Using the modified Seldinger access technique, a 50F slender Glidesheath was placed in the right radial artery. 3 mg Verapamil was given through the sheath. Heparin 5,000 units were administered.  Selective coronary angiography was performed using 8F JL3.5 and JR4 catheters to engage the left and right coronary arteries, respectively. Left heart catheterization was performed using a 8F JR4 and pigtail catheters. Left ventriculogram was performed with a hand injection through the JR4 catheter at the start of the procedure and power injection of contrast through the pigtail catheter following coronary angiography (due to suboptimal visualization with initial hand injection).  At the end of the procedure, the radial artery sheath was removed and a TR band applied to achieve patent  hemostasis. There were no immediate complications. The patient was taken to the recovery area in stable condition.  Contrast used: 65 mL Isovue Fluoroscopy time: 4.0 min Radiation dose: 662 mGy   Estimated blood loss <50 mL.  During this procedure the patient was administered the following to achieve and maintain moderate conscious sedation: Versed 1 mg, Fentanyl 50 mcg, while the patient's heart rate, blood pressure, and oxygen saturation were continuously monitored. The period of conscious sedation was 33 minutes, of which I was present face-to-face 100% of this time.  Complications   Complications documented before study signed (07/17/2017 6:28 PM EDT)    No complications were associated with this study.  Documented by Yvonne Kendall, MD - 07/17/2017 6:23 PM EDT    Coronary Findings   Diagnostic  Dominance: Right  Left Main  Vessel is moderate in size. There is moderate diffuse disease throughout the vessel.  Left Anterior Descending  Ost LAD lesion 95% stenosed  Ost LAD lesion is 95% stenosed. The lesion is eccentric.  Mid LAD lesion 30% stenosed  Mid LAD lesion is 30% stenosed.  First Diagonal Branch  Vessel is small in size.  Ost 1st Diag lesion 90% stenosed  Ost 1st Diag lesion is 90% stenosed.  Second Diagonal Branch  Vessel is small in size.  Second Septal Branch  Collaterals  2nd Sept filled by collaterals from RPDA.    Third Diagonal Branch  Vessel is small in size.  Ramus Intermedius  Vessel is moderate in size. Vessel is angiographically normal.  Left Circumflex  First Obtuse Marginal Branch  Vessel is small in size.  Second Obtuse Marginal Branch  Vessel is moderate in size.  Lateral Second Obtuse Marginal Branch  Vessel is moderate in size.  Lat 2nd Mrg lesion 70% stenosed  Lat 2nd Mrg lesion is 70% stenosed.  Third Obtuse Marginal Branch  Vessel is small in size.  Right Coronary Artery  Vessel is moderate in size.  Prox RCA to Mid RCA lesion  50% stenosed  Prox RCA to Mid RCA lesion is 50% stenosed.  Mid RCA to Dist RCA lesion 80% stenosed  Mid  RCA to Dist RCA lesion is 80% stenosed.  Right Posterior Descending Artery  RPDA lesion 95% stenosed  RPDA lesion is 95% stenosed.  Right Posterior Atrioventricular Branch  Vessel is small in size.  Intervention   No interventions have been documented.  Wall Motion              Left Heart   Left Ventricle The left ventricular size is normal. The left ventricular systolic function is normal. LV end diastolic pressure is normal. LVEDP 25 mmHg at start of case. Following administration of NTG, LVEDP fell to 10-15 mmHg. The left ventricular ejection fraction is 50-55% by visual estimate. There are LV function abnormalities due to segmental dysfunction.  Aortic Valve There is no aortic valve stenosis.  Coronary Diagrams   Diagnostic Diagram       Implants    No implant documentation for this case.  MERGE Images   Show images for CARDIAC CATHETERIZATION   Link to Procedure Log   Procedure Log    Hemo Data    Most Recent Value  AO Systolic Pressure 146 mmHg  AO Diastolic Pressure 87 mmHg  AO Mean 114 mmHg  LV Systolic Pressure 135 mmHg  LV Diastolic Pressure 7 mmHg  LV EDP 11 mmHg  Arterial Occlusion Pressure Extended Systolic Pressure 141 mmHg  Arterial Occlusion Pressure Extended Diastolic Pressure 82 mmHg  Arterial Occlusion Pressure Extended Mean Pressure 109 mmHg  Left Ventricular Apex Extended Systolic Pressure 143 mmHg  Left Ventricular Apex Extended Diastolic Pressure 7 mmHg  Left Ventricular Apex Extended EDP Pressure 12 mmHg       I have seen and examined the patient and agree with the assessment and plan as outlined above by Lowella Dandy, PA-C.  Patient is a 67 year old male with no previous history of coronary artery disease who presents with a 5-week history of classical symptoms of angina pectoris.  He first experienced an episode of severe  substernal chest pain described as a pressure-like sensation as though an elephant was sitting on his chest that occurred while he was walking with a group while traveling in [place].  Symptoms resolved with rest.  Over the past several weeks the patient has continued to experience exertional chest discomfort with increasing frequency.  He has had one brief episode of chest discomfort that awoke him from his sleep.  He now gets chest discomfort intermittently with low-level activity.  He has developed some exertional shortness of breath.  He was evaluated in the office by Dr. Tomie China and prompt cardiac catheterization was arranged.  Catheterization performed by Dr. Okey Dupre revealed severe three-vessel coronary artery disease with preserved left ventricular function.  Cardiothoracic surgical consultation was requested.  I have personally reviewed the patient's history, physical exam,  transthoracic echocardiogram, and diagnostic cardiac catheterization.  The patient has severe three-vessel coronary artery disease with diffuse disease that appears relatively unfavorable for percutaneous core intervention and stenting.  Left ventricular systolic function remains well-preserved.  I agree the patient would best be treated with surgical revascularization.  I have reviewed the indications, risks, and potential benefits of coronary artery bypass grafting with the patient and his family at the bedsid.  Alternative treatment strategies have been discussed, including the relative risks, benefits and long term prognosis associated with medical therapy, percutaneous coronary intervention, and surgical revascularization.  The patient understands and accepts all potential associated risks of surgery including but not limited to risk of death, stroke or other neurologic complication, myocardial infarction, congestive heart failure,  respiratory failure, renal failure, bleeding requiring blood transfusion and/or  reexploration, aortic dissection or other major vascular complication, arrhythmia, heart block or bradycardia requiring permanent pacemaker, pneumonia, pleural effusion, wound infection, pulmonary embolus or other thromboembolic complication, chronic pain or other delayed complications related to median sternotomy, or the late recurrence of symptomatic ischemic heart disease and/or congestive heart failure.  The importance of long term risk modification have been emphasized.  All questions answered.  We tentatively plan for the patient undergo elective coronary artery bypass grafting by one of my partners as soon as practical this week.  Currently our operating room schedule is quite full and the precise timing of surgery remains in question.  We will continue to follow along daily.   I spent in excess of 60 minutes during the conduct of this hospital encounter and >50% of this time involved direct face-to-face encounter with the patient for counseling and/or coordination of their care.   Purcell Nailslarence H Reid Nawrot, MD 07/18/2017 12:50 PM

## 2017-07-17 NOTE — Progress Notes (Signed)
  Echocardiogram 2D Echocardiogram has been performed.  Thomas Miller, Thomas Miller 07/17/2017, 6:13 PM

## 2017-07-17 NOTE — Progress Notes (Signed)
Pt leaves cath lab holding area in stable condition. Rt radial is unremarkable. Rt radial is CDI.

## 2017-07-17 NOTE — Interval H&P Note (Signed)
History and Physical Interval Note:  07/17/2017 2:23 PM  Thomas Miller  has presented today for cardiac catheterization, with the diagnosis of unstable angina.  The various methods of treatment have been discussed with the patient and family. After consideration of risks, benefits and other options for treatment, the patient has consented to  Procedure(s): LEFT HEART CATH AND CORONARY ANGIOGRAPHY (N/A) as a surgical intervention .  The patient's history has been reviewed, patient examined, no change in status, stable for surgery.  I have reviewed the patient's chart and labs.  Questions were answered to the patient's satisfaction.    Cath Lab Visit (complete for each Cath Lab visit)  Clinical Evaluation Leading to the Procedure:   ACS: Yes.    Non-ACS:    Anginal Classification: CCS III  Anti-ischemic medical therapy: No Therapy  Non-Invasive Test Results: No non-invasive testing performed  Prior CABG: No previous CABG   Aryn Kops

## 2017-07-18 ENCOUNTER — Encounter (HOSPITAL_COMMUNITY): Payer: Self-pay

## 2017-07-18 ENCOUNTER — Other Ambulatory Visit: Payer: Self-pay

## 2017-07-18 ENCOUNTER — Inpatient Hospital Stay (HOSPITAL_COMMUNITY): Payer: Medicare Other

## 2017-07-18 DIAGNOSIS — I25110 Atherosclerotic heart disease of native coronary artery with unstable angina pectoris: Secondary | ICD-10-CM

## 2017-07-18 DIAGNOSIS — E7800 Pure hypercholesterolemia, unspecified: Secondary | ICD-10-CM

## 2017-07-18 DIAGNOSIS — I10 Essential (primary) hypertension: Secondary | ICD-10-CM

## 2017-07-18 LAB — COMPREHENSIVE METABOLIC PANEL
ALT: 33 U/L (ref 17–63)
AST: 32 U/L (ref 15–41)
Albumin: 4.1 g/dL (ref 3.5–5.0)
Alkaline Phosphatase: 64 U/L (ref 38–126)
Anion gap: 10 (ref 5–15)
BUN: 17 mg/dL (ref 6–20)
CO2: 24 mmol/L (ref 22–32)
Calcium: 9.4 mg/dL (ref 8.9–10.3)
Chloride: 107 mmol/L (ref 101–111)
Creatinine, Ser: 1.17 mg/dL (ref 0.61–1.24)
GFR calc Af Amer: >60 mL/min (ref >60)
GFR calc non Af Amer: >60 mL/min (ref >60)
Glucose, Bld: 148 mg/dL — ABNORMAL HIGH (ref 65–99)
Potassium: 4.2 mmol/L (ref 3.5–5.1)
Sodium: 141 mmol/L (ref 135–145)
Total Bilirubin: 0.9 mg/dL (ref 0.3–1.2)
Total Protein: 6.7 g/dL (ref 6.5–8.1)

## 2017-07-18 LAB — CBC
HCT: 46.3 % (ref 39.0–52.0)
Hemoglobin: 15.2 g/dL (ref 13.0–17.0)
MCH: 29.1 pg (ref 26.0–34.0)
MCHC: 32.8 g/dL (ref 30.0–36.0)
MCV: 88.5 fL (ref 78.0–100.0)
Platelets: 216 10*3/uL (ref 150–400)
RBC: 5.23 MIL/uL (ref 4.22–5.81)
RDW: 13.3 % (ref 11.5–15.5)
WBC: 12.2 10*3/uL — ABNORMAL HIGH (ref 4.0–10.5)

## 2017-07-18 LAB — PROTIME-INR
INR: 1.1
Prothrombin Time: 14.1 seconds (ref 11.4–15.2)

## 2017-07-18 LAB — BLOOD GAS, ARTERIAL
Acid-base deficit: 2.4 mmol/L — ABNORMAL HIGH (ref 0.0–2.0)
Bicarbonate: 21.2 mmol/L (ref 20.0–28.0)
Drawn by: 283381
O2 Saturation: 95.4 %
Patient temperature: 98.6
pCO2 arterial: 32.1 mmHg (ref 32.0–48.0)
pH, Arterial: 7.434 (ref 7.350–7.450)
pO2, Arterial: 76.9 mmHg — ABNORMAL LOW (ref 83.0–108.0)

## 2017-07-18 LAB — LIPID PANEL
Cholesterol: 146 mg/dL (ref 0–200)
HDL: 48 mg/dL (ref >40)
LDL Cholesterol: 88 mg/dL (ref 0–99)
Total CHOL/HDL Ratio: 3 RATIO
Triglycerides: 51 mg/dL (ref <150)
VLDL: 10 mg/dL (ref 0–40)

## 2017-07-18 LAB — HEMOGLOBIN A1C
Hgb A1c MFr Bld: 5.7 % — ABNORMAL HIGH (ref 4.8–5.6)
Mean Plasma Glucose: 116.89 mg/dL

## 2017-07-18 LAB — ECHOCARDIOGRAM COMPLETE
Height: 70 in
Weight: 3456 oz

## 2017-07-18 LAB — TYPE AND SCREEN
ABO/RH(D): O POS
Antibody Screen: NEGATIVE

## 2017-07-18 LAB — APTT: aPTT: 59 seconds — ABNORMAL HIGH (ref 24–36)

## 2017-07-18 LAB — HEPARIN LEVEL (UNFRACTIONATED)
Heparin Unfractionated: 0.34 IU/mL (ref 0.30–0.70)
Heparin Unfractionated: 0.63 IU/mL (ref 0.30–0.70)

## 2017-07-18 LAB — ABO/RH: ABO/RH(D): O POS

## 2017-07-18 MED ORDER — LORAZEPAM 1 MG PO TABS
1.0000 mg | ORAL_TABLET | Freq: Once | ORAL | Status: AC | PRN
  Administered 2017-07-18: 1 mg via ORAL
  Filled 2017-07-18: qty 1

## 2017-07-18 MED ORDER — NITROGLYCERIN IN D5W 200-5 MCG/ML-% IV SOLN
0.0000 ug/min | INTRAVENOUS | Status: DC
  Administered 2017-07-19: 23 ug/min via INTRAVENOUS
  Filled 2017-07-18: qty 250

## 2017-07-18 NOTE — Progress Notes (Signed)
ANTICOAGULATION CONSULT NOTE   Pharmacy Consult for Heparin Indication: ACS > possible CABG  Allergies  Allergen Reactions  . Codeine Other (See Comments)    Insomnia  . Iodine Rash  . Naprosyn [Naproxen] Rash    Patient Measurements: Height: 5\' 10"  (177.8 cm) Weight: 216 lb (98 kg) IBW/kg (Calculated) : 73 Heparin Dosing Weight: 93 kg  Vital Signs: Temp: 98 F (36.7 C) (06/22 1129) Temp Source: Oral (06/22 1129) BP: 116/71 (06/22 1129) Pulse Rate: 66 (06/22 1129)  Labs: Recent Labs    07/16/17 1450 07/18/17 0249 07/18/17 1200  HGB 16.5 15.2  --   HCT 47.6 46.3  --   PLT 241 216  --   APTT  --  59*  --   LABPROT  --  14.1  --   INR  --  1.10  --   HEPARINUNFRC  --  0.34 0.63  CREATININE 1.01 1.17  --       Medical History: Past Medical History:  Diagnosis Date  . Gout   . Hearing loss   . Hypertension   . Reflux      Assessment: 67 yo male admitted with unstable angina. Now s/p cath today, found multi-vessel disease, consulting surgeons to evaluate patient for possible CABG. Pharmacy consulted to bridge to heparin in the meantime. Sheath was removed at 1520.  Heparin level therapeutic  Goal of Therapy:  Heparin level 0.3-0.7 units/ml Monitor platelets by anticoagulation protocol: Yes   Plan:  Cont heparin at 1300 units/hr Daily heparin level, CBC  Thank you Okey RegalLisa Lainey Nelson, PharmD [phone]

## 2017-07-18 NOTE — Progress Notes (Signed)
Pt complained of chest pain at 1858, radiating to left jaw. Increased nitro from 20 mcg to 23 mcg. Bp 116/72, 100%ra,sinus brady 58. ekg showed no changes. Pt reported pain as 2 and decreased to 0 after 5 minutes. Cards fellow made aware. Tammy SoursAngela Maiko Salais

## 2017-07-18 NOTE — Progress Notes (Signed)
ANTICOAGULATION CONSULT NOTE   Pharmacy Consult for Heparin Indication: ACS > possible CABG  Allergies  Allergen Reactions  . Codeine Other (See Comments)    Insomnia  . Iodine Rash  . Naprosyn [Naproxen] Rash    Patient Measurements: Height: 5\' 10"  (177.8 cm) Weight: 216 lb (98 kg) IBW/kg (Calculated) : 73 Heparin Dosing Weight: 93 kg  Vital Signs: Temp: 97.5 F (36.4 C) (06/22 0300) Temp Source: Oral (06/22 0300) BP: 127/71 (06/22 0300) Pulse Rate: 85 (06/22 0300)  Labs: Recent Labs    07/16/17 1450 07/18/17 0249  HGB 16.5 15.2  HCT 47.6 46.3  PLT 241 216  APTT  --  59*  LABPROT  --  14.1  INR  --  1.10  HEPARINUNFRC  --  0.34  CREATININE 1.01  --       Medical History: Past Medical History:  Diagnosis Date  . Gout   . Hearing loss   . Hypertension   . Reflux      Assessment: 67 yo male admitted with unstable angina. Now s/p cath today, found multi-vessel disease, consulting surgeons to evaluate patient for possible CABG. Pharmacy consulted to bridge to heparin in the meantime. Sheath was removed at 1520.  6/22 AM update: therapeutic heparin level x 1 after re-start s/p cath, awaiting cardiac surgery consult  Goal of Therapy:  Heparin level 0.3-0.7 units/ml Monitor platelets by anticoagulation protocol: Yes   Plan:  Cont heparin at 1300 units/hr 1200 HL  Abran DukeJames Zaahir Pickney, PharmD, BCPS Clinical Pharmacist Phone: [phone]

## 2017-07-18 NOTE — Progress Notes (Signed)
Progress Note  Patient Name: Thomas Miller Date of Encounter: 07/18/2017  Primary Cardiologist: No primary care provider on file.   Subjective   Occasionally has had angina when he walks to the bathroom.  Had one episode of mild chest tightness at rest.  Currently asymptomatic on intravenous heparin and nitroglycerin. Echo shows normal regional wall motion and preserved LV function.  Inpatient Medications    Scheduled Meds: . allopurinol  300 mg Oral Daily  . aspirin EC  81 mg Oral Daily  . atorvastatin  80 mg Oral q1800  . pantoprazole  40 mg Oral Daily  . sodium chloride flush  3 mL Intravenous Q12H   Continuous Infusions: . sodium chloride 10 mL/hr at 07/18/17 0700  . heparin 1,300 Units/hr (07/18/17 0700)  . nitroGLYCERIN 20 mcg/min (07/18/17 0700)   PRN Meds: sodium chloride, acetaminophen, albuterol, nitroGLYCERIN, ondansetron (ZOFRAN) IV, sodium chloride flush   Vital Signs    Vitals:   07/17/17 1948 07/17/17 2307 07/18/17 0300 07/18/17 0710  BP: 138/87 (!) 115/55 127/71 113/70  Pulse: (!) 101 97 85 80  Resp: 17 (!) 24 15 (!) 21  Temp: (!) 97.4 F (36.3 C) 98.1 F (36.7 C) (!) 97.5 F (36.4 C) 98.2 F (36.8 C)  TempSrc: Oral Oral Oral Oral  SpO2: 95% 93% 96% 93%  Weight:      Height:        Intake/Output Summary (Last 24 hours) at 07/18/2017 1058 Last data filed at 07/18/2017 0900 Gross per 24 hour  Intake 875.55 ml  Output -  Net 875.55 ml   Filed Weights   07/17/17 1102  Weight: 216 lb (98 kg)    Telemetry    NSR - Personally Reviewed  ECG    NSR, nonspecific T flattening - Personally Reviewed  Physical Exam  Obese GEN: No acute distress.   Neck: No JVD Cardiac: RRR, no murmurs, rubs, or gallops.  Respiratory: Clear to auscultation bilaterally. GI: Soft, nontender, non-distended  MS: No edema; No deformity.  No bleeding or hematoma at right radial artery access site Neuro:  Nonfocal  Psych: Normal affect   Labs     Chemistry Recent Labs  Lab 07/16/17 1450 07/18/17 0249  NA 143 141  K 4.7 4.2  CL 102 107  CO2 24 24  GLUCOSE 91 148*  BUN 13 17  CREATININE 1.01 1.17  CALCIUM 10.1 9.4  PROT  --  6.7  ALBUMIN  --  4.1  AST  --  32  ALT  --  33  ALKPHOS  --  64  BILITOT  --  0.9  GFRNONAA 77 >60  GFRAA 89 >60  ANIONGAP  --  10     Hematology Recent Labs  Lab 07/16/17 1450 07/18/17 0249  WBC 10.5 12.2*  RBC 5.52 5.23  HGB 16.5 15.2  HCT 47.6 46.3  MCV 86 88.5  MCH 29.9 29.1  MCHC 34.7 32.8  RDW 14.4 13.3  PLT 241 216    Cardiac EnzymesNo results for input(s): TROPONINI in the last 168 hours. No results for input(s): TROPIPOC in the last 168 hours.   BNPNo results for input(s): BNP, PROBNP in the last 168 hours.   DDimer No results for input(s): DDIMER in the last 168 hours.   Radiology    Dg Chest 2 View  Result Date: 07/18/2017 CLINICAL DATA:  Chest pain and shortness of breath last night, now resolved. EXAM: CHEST - 2 VIEW COMPARISON:  Chest x-ray dated July 06, 2017. FINDINGS: The heart size and mediastinal contours are within normal limits. Both lungs are clear. The visualized skeletal structures are unremarkable. IMPRESSION: No active cardiopulmonary disease. Electronically Signed   By: Obie Dredge M.D.   On: 07/18/2017 08:03    Cardiac Studies   07/17/2017 ECHO Left ventricle: The cavity size was normal. There was mild   concentric hypertrophy. Systolic function was normal. The   estimated ejection fraction was in the range of 55% to 60%. Wall   motion was normal; there were no regional wall motion   abnormalities. Doppler parameters are consistent with abnormal   left ventricular relaxation (grade 1 diastolic dysfunction).   Acoustic contrast opacification revealed no evidence ofthrombus.  07/17/2017 CATH Conclusions: 1. Severe multivessel coronary artery disease, as detailed below.  Most critical lesions are 95% ostial LAD stenosis and sequential 80% and  95% mid/distal RCA and rPDA stenoses. 2. Normal to mildly elevated left ventricular filling pressure. 3. Low normal left ventricular contraction with subtle inferior hypokinesis.  LVEF 50-55%.  Recommendations: 1. Due to intermittent angina during procedure relieved with sublingual and IV nitroglycerin, patient will be admitted to stepdown. 2. Cardiac surgery consultation for CABG evaluation.  His LAD disease is best suited to bypass.  However, given suboptimal targets for OM2 and RCA/rPDA, a hybrid approach with LIMA-LAD and PCI to RCA/rPDA may need to be considered.  Recommend Heart Team approach to optimize revascularization. 3. Aggressive secondary prevention, including high-intensity statin therapy. 4. Start heparin infusion 4 hours after sheath removal. 5. Titrate nitroglycerin gtt for relief of chest pain. 6. Obtain transthoracic echocardiogram to assess for structural abnormalities.  Patient Profile     67 y.o. male with long-standing hyperlipidemia and hypertension admitted with unstable angina and found to have severe multivessel coronary artery disease, preserved left ventricular systolic function.  Assessment & Plan    1. CAD with [place]: Revascularization on this admission, at least in part to via surgical bypass (LIMA to LAD with severe ostial stenosis).  May require hybrid approach due to poor distal targets. For surgical evaluation today. 2. HTN: Excellent control 3. HLP: LDL not quite at target on statin, dose increased.  For questions or updates, please contact [facility] Please consult [email or web address] for contact info under Cardiology/STEMI.      Signed, Thurmon Fair, MD  07/18/2017, 10:58 AM

## 2017-07-19 ENCOUNTER — Inpatient Hospital Stay (HOSPITAL_COMMUNITY): Payer: Medicare Other

## 2017-07-19 DIAGNOSIS — Z01810 Encounter for preprocedural cardiovascular examination: Secondary | ICD-10-CM

## 2017-07-19 LAB — URINALYSIS, ROUTINE W REFLEX MICROSCOPIC
Bilirubin Urine: NEGATIVE
Glucose, UA: NEGATIVE mg/dL
Hgb urine dipstick: NEGATIVE
Ketones, ur: NEGATIVE mg/dL
Leukocytes, UA: NEGATIVE
Nitrite: NEGATIVE
Protein, ur: NEGATIVE mg/dL
Specific Gravity, Urine: 1.008 (ref 1.005–1.030)
pH: 6 (ref 5.0–8.0)

## 2017-07-19 LAB — CBC
HCT: 44.5 % (ref 39.0–52.0)
Hemoglobin: 14.3 g/dL (ref 13.0–17.0)
MCH: 29.2 pg (ref 26.0–34.0)
MCHC: 32.1 g/dL (ref 30.0–36.0)
MCV: 90.8 fL (ref 78.0–100.0)
Platelets: 187 10*3/uL (ref 150–400)
RBC: 4.9 MIL/uL (ref 4.22–5.81)
RDW: 13.8 % (ref 11.5–15.5)
WBC: 15.1 10*3/uL — ABNORMAL HIGH (ref 4.0–10.5)

## 2017-07-19 LAB — HEPARIN LEVEL (UNFRACTIONATED): Heparin Unfractionated: 0.62 IU/mL (ref 0.30–0.70)

## 2017-07-19 MED ORDER — METOPROLOL TARTRATE 12.5 MG HALF TABLET
12.5000 mg | ORAL_TABLET | Freq: Once | ORAL | Status: AC
  Administered 2017-07-20: 12.5 mg via ORAL
  Filled 2017-07-19: qty 1

## 2017-07-19 MED ORDER — MAGNESIUM SULFATE 50 % IJ SOLN
40.0000 meq | INTRAMUSCULAR | Status: DC
  Filled 2017-07-19: qty 9.85

## 2017-07-19 MED ORDER — METOPROLOL TARTRATE 12.5 MG HALF TABLET: 12.5000 mg | ORAL_TABLET | Freq: Once | ORAL | Status: DC

## 2017-07-19 MED ORDER — TEMAZEPAM 15 MG PO CAPS
15.0000 mg | ORAL_CAPSULE | Freq: Once | ORAL | Status: DC | PRN
  Filled 2017-07-19: qty 1

## 2017-07-19 MED ORDER — CHLORHEXIDINE GLUCONATE CLOTH 2 % EX PADS: 6.0000 | MEDICATED_PAD | Freq: Once | CUTANEOUS | Status: AC

## 2017-07-19 MED ORDER — TRANEXAMIC ACID 1000 MG/10ML IV SOLN
1.5000 mg/kg/h | INTRAVENOUS | Status: AC
  Administered 2017-07-20: 1.5 mg/kg/h via INTRAVENOUS
  Filled 2017-07-19: qty 25

## 2017-07-19 MED ORDER — TRANEXAMIC ACID (OHS) PUMP PRIME SOLUTION
2.0000 mg/kg | INTRAVENOUS | Status: DC
  Filled 2017-07-19: qty 1.99

## 2017-07-19 MED ORDER — CHLORHEXIDINE GLUCONATE 0.12 % MT SOLN: 15.0000 mL | Freq: Once | OROMUCOSAL | Status: DC

## 2017-07-19 MED ORDER — SODIUM CHLORIDE 0.9 % IV SOLN
750.0000 mg | INTRAVENOUS | Status: DC
  Filled 2017-07-19: qty 750

## 2017-07-19 MED ORDER — SODIUM CHLORIDE 0.9 % IV SOLN
30.0000 ug/min | INTRAVENOUS | Status: AC
  Administered 2017-07-20: 10 ug/min via INTRAVENOUS
  Filled 2017-07-19: qty 20

## 2017-07-19 MED ORDER — SODIUM CHLORIDE 0.9 % IV SOLN
INTRAVENOUS | Status: DC
  Filled 2017-07-19: qty 30

## 2017-07-19 MED ORDER — PLASMA-LYTE 148 IV SOLN
INTRAVENOUS | Status: AC
  Administered 2017-07-20: 500 mL
  Filled 2017-07-19: qty 2.5

## 2017-07-19 MED ORDER — POTASSIUM CHLORIDE 2 MEQ/ML IV SOLN
80.0000 meq | INTRAVENOUS | Status: DC
  Filled 2017-07-19: qty 40

## 2017-07-19 MED ORDER — SODIUM CHLORIDE 0.9 % IV SOLN
1.5000 g | INTRAVENOUS | Status: AC
  Administered 2017-07-20: 1.5 g via INTRAVENOUS
  Filled 2017-07-19: qty 1.5

## 2017-07-19 MED ORDER — CARVEDILOL 6.25 MG PO TABS: 6.2500 mg | ORAL_TABLET | Freq: Two times a day (BID) | ORAL | Status: DC

## 2017-07-19 MED ORDER — TEMAZEPAM 15 MG PO CAPS
15.0000 mg | ORAL_CAPSULE | Freq: Once | ORAL | Status: AC | PRN
  Administered 2017-07-19: 15 mg via ORAL

## 2017-07-19 MED ORDER — CARVEDILOL 6.25 MG PO TABS
6.2500 mg | ORAL_TABLET | Freq: Two times a day (BID) | ORAL | Status: DC
  Administered 2017-07-19 – 2017-07-20 (×3): 6.25 mg via ORAL
  Filled 2017-07-19 (×3): qty 1

## 2017-07-19 MED ORDER — NITROGLYCERIN IN D5W 200-5 MCG/ML-% IV SOLN
2.0000 ug/min | INTRAVENOUS | Status: AC
  Administered 2017-07-20: 23 ug/min via INTRAVENOUS
  Filled 2017-07-19: qty 250

## 2017-07-19 MED ORDER — BISACODYL 5 MG PO TBEC: 5.0000 mg | DELAYED_RELEASE_TABLET | Freq: Once | ORAL | Status: AC

## 2017-07-19 MED ORDER — DEXMEDETOMIDINE HCL IN NACL 400 MCG/100ML IV SOLN
0.1000 ug/kg/h | INTRAVENOUS | Status: AC
  Administered 2017-07-20: .7 ug/kg/h via INTRAVENOUS
  Filled 2017-07-19: qty 100

## 2017-07-19 MED ORDER — SODIUM CHLORIDE 0.9 % IV SOLN
INTRAVENOUS | Status: DC
  Filled 2017-07-19: qty 1

## 2017-07-19 MED ORDER — CHLORHEXIDINE GLUCONATE 4 % EX LIQD
CUTANEOUS | Status: AC
  Administered 2017-07-19: 21:00:00
  Filled 2017-07-19: qty 15

## 2017-07-19 MED ORDER — DOPAMINE-DEXTROSE 3.2-5 MG/ML-% IV SOLN
0.0000 ug/kg/min | INTRAVENOUS | Status: DC
  Filled 2017-07-19: qty 250

## 2017-07-19 MED ORDER — BISACODYL 5 MG PO TBEC
5.0000 mg | DELAYED_RELEASE_TABLET | Freq: Once | ORAL | Status: DC
  Filled 2017-07-19: qty 1

## 2017-07-19 MED ORDER — DEXTROSE 5 % IV SOLN
0.0000 ug/min | INTRAVENOUS | Status: DC
  Filled 2017-07-19: qty 4

## 2017-07-19 MED ORDER — TRANEXAMIC ACID (OHS) BOLUS VIA INFUSION
15.0000 mg/kg | INTRAVENOUS | Status: AC
  Administered 2017-07-20: 1489.5 mg via INTRAVENOUS
  Filled 2017-07-19: qty 1490

## 2017-07-19 MED ORDER — MILRINONE LACTATE IN DEXTROSE 20-5 MG/100ML-% IV SOLN
0.1250 ug/kg/min | INTRAVENOUS | Status: DC
  Filled 2017-07-19 (×2): qty 100

## 2017-07-19 MED ORDER — VANCOMYCIN HCL 10 G IV SOLR
1500.0000 mg | INTRAVENOUS | Status: AC
  Administered 2017-07-20: 1500 mg via INTRAVENOUS
  Filled 2017-07-19: qty 1500

## 2017-07-19 NOTE — Progress Notes (Signed)
Patient ID: Thomas Miller, male   DOB: 1950/04/20, 67 y.o.   MRN: [id number]      [address]       [place] [id number]             [phone]    I have seen and examined the patient and also reviewed his echocardiogram and cardiac catheterization films.  Patient has been having classic unstable anginal symptoms since May 2019 [place]  The episodes of substernal chest pain shortness of breath radiating to the left jaw have become increasingly frequent and severe.  Patient underwent cardiac catheterization on Friday.  Seen in consultation by Dr. Cornelius Moraswen.  I agree with his evaluation and have discussed with the patient proceeding with coronary artery bypass grafting tomorrow afternoon.  He has diffuse distal disease involving the PD and distal circumflex that may not be bypassable but he has critical disease in the LAD and second OM appear to be adequate targets.  I discussed the risks and options with the patient. The goals risks and alternatives of the planned surgical procedure CABG  have been discussed with the patient in detail. The risks of the procedure including death, infection, stroke, myocardial infarction, bleeding, blood transfusion have all been discussed specifically.  I have quoted Adriana MccallumLarry G Lucero a 3 % of perioperative mortality and a complication rate as high as 40 %. The patient's questions have been answered.Adriana MccallumLarry G Winders is willing  to proceed with the planned procedure.  Delight OvensEdward B Atthew Coutant MD      [address] [place] [id number] Office [phone]   Beeper [phone]

## 2017-07-19 NOTE — Progress Notes (Addendum)
Began to start another IV due to left IV leaking. After starting a 20 gauge in right forearm C/o chest pain 5/10, nitro subliqual given, chest pain went down to 3/10,  0712 chest pain 1/10 currently. Will pass on to day shift nurse to further monitor and report to MD on rounds about his 2 episodes during the night. EKG was done earlier with no ST elevation.  Remains on nitro gtt at 6.589ml/hr and heparin at 6613ml/hr. NS at 10 currently.

## 2017-07-19 NOTE — Progress Notes (Signed)
      [address]       [place] [id number]             [phone]     CARDIOTHORACIC SURGERY PROGRESS NOTE  2 Days Post-Op  S/P Procedure(s) (LRB): LEFT HEART CATH AND CORONARY ANGIOGRAPHY (N/A)  Subjective: Multiple episodes of substernal chest pain overnight currently relieved by escalating doses of IV nitroglycerin.  No shortness of breath.    Objective: Vital signs in last 24 hours: Temp:  [97.8 F (36.6 C)-98.6 F (37 C)] 98.6 F (37 C) (06/23 0718) Pulse Rate:  [53-70] 68 (06/23 0730) Cardiac Rhythm: Normal sinus rhythm (06/23 0700) Resp:  [13-24] 16 (06/23 0730) BP: (106-153)/(62-94) 122/72 (06/23 0730) SpO2:  [93 %-97 %] 93 % (06/23 0718) Weight:  [219 lb (99.3 kg)] 219 lb (99.3 kg) (06/23 [id number])  Physical Exam:  Rhythm:   sinus  Breath sounds: clear  Heart sounds:  RRR  Incisions:  n/a  Abdomen:  soft  Extremities:  warm   Intake/Output from previous day: 06/22 0701 - 06/23 0700 In: 1926.5 [P.O.:1240; I.V.:686.5] Out: 600 [Urine:600] Intake/Output this shift: No intake/output data recorded.  Lab Results: Recent Labs    07/18/17 0249 07/19/17 0334  WBC 12.2* 15.1*  HGB 15.2 14.3  HCT 46.3 44.5  PLT 216 187   BMET:  Recent Labs    07/16/17 1450 07/18/17 0249  NA 143 141  K 4.7 4.2  CL 102 107  CO2 24 24  GLUCOSE 91 148*  BUN 13 17  CREATININE 1.01 1.17  CALCIUM 10.1 9.4    CBG (last 3)  No results for input(s): GLUCAP in the last 72 hours. PT/INR:   Recent Labs    07/18/17 0249  LABPROT 14.1  INR 1.10    CXR:  CHEST - 2 VIEW  COMPARISON:  Chest x-ray dated July 06, 2017.  FINDINGS: The heart size and mediastinal contours are within normal limits. Both lungs are clear. The visualized skeletal structures are unremarkable.  IMPRESSION: No active cardiopulmonary disease.   Electronically Signed   By: Obie DredgeWilliam T Derry M.D.   On: 07/18/2017 08:03   Assessment/Plan: S/P Procedure(s) (LRB): LEFT  HEART CATH AND CORONARY ANGIOGRAPHY (N/A)  We tentatively plan to add the patient onto our surgical schedule for tomorrow for coronary artery bypass grafting by 1 of my partners, Dr. Tyrone SageGerhardt.  I have again discussed the indications, risk, and potential benefits of surgery with the patient and his family at the bedside.  All questions answered.  I spent in excess of 15 minutes during the conduct of this hospital encounter and >50% of this time involved direct face-to-face encounter with the patient for counseling and/or coordination of their care.   Purcell Nailslarence H Owen, MD 07/19/2017 9:14 AM

## 2017-07-19 NOTE — Progress Notes (Signed)
ANTICOAGULATION CONSULT NOTE   Pharmacy Consult for Heparin Indication: ACS > possible CABG  Allergies  Allergen Reactions  . Codeine Other (See Comments)    Insomnia  . Iodine Rash  . Naprosyn [Naproxen] Rash    Patient Measurements: Height: 5\' 10"  (177.8 cm) Weight: 219 lb (99.3 kg) IBW/kg (Calculated) : 73 Heparin Dosing Weight: 93 kg  Vital Signs: Temp: 97.9 F (36.6 C) (06/23 1119) Temp Source: Oral (06/23 1119) BP: 121/69 (06/23 1119) Pulse Rate: 78 (06/23 1119)  Labs: Recent Labs    07/16/17 1450 07/18/17 0249 07/18/17 1200 07/19/17 0334  HGB 16.5 15.2  --  14.3  HCT 47.6 46.3  --  44.5  PLT 241 216  --  187  APTT  --  59*  --   --   LABPROT  --  14.1  --   --   INR  --  1.10  --   --   HEPARINUNFRC  --  0.34 0.63 0.62  CREATININE 1.01 1.17  --   --       Medical History: Past Medical History:  Diagnosis Date  . Gout   . Hearing loss   . Hypertension   . Reflux      Assessment: 67 yo male admitted with unstable angina. Now s/p cath today, found multi-vessel disease, tentative CABG planned for tomorrow. Pharmacy consulted to bridge to heparin in the meantime.   Heparin level remains at goal this morning.  CBC stable.  No overt bleeding or complications noted.  Goal of Therapy:  Heparin level 0.3-0.7 units/ml Monitor platelets by anticoagulation protocol: Yes   Plan:  Cont heparin at 1300 units/hr Daily heparin level, CBC F/u p OR tomorrow.  Jenetta DownerJessica Nahiem Dredge, Pharm D, BCPS, [facility] Clinical Pharmacist Phone [phone]  07/19/2017 12:36 PM

## 2017-07-19 NOTE — Progress Notes (Signed)
Patient c/o chest pain while on side of bed washing up, 4/10 pressure to the middle of his chest and radiation to left side of jaw. Appears somewhat short of breath. Stat EKG in progress at present. VS- 122/80, 94%-95% on room air, HR 71, NSR on monitor.  After resting, patient says his pain is 1/10. MD paged to make aware.

## 2017-07-19 NOTE — Progress Notes (Signed)
Progress Note  Patient Name: Michae KavaLarry G Vanvorst Date of Encounter: 07/19/2017  Primary Cardiologist: No primary care provider on file.   Subjective   Had angina at least 3 times, twice going to bathroom, but once at rest. Responded to increase in NTG IV and SL NTG. Now angina free, but looks concerned.  Inpatient Medications    Scheduled Meds: . allopurinol  300 mg Oral Daily  . aspirin EC  81 mg Oral Daily  . atorvastatin  80 mg Oral q1800  . carvedilol  6.25 mg Oral BID WC  . pantoprazole  40 mg Oral Daily  . sodium chloride flush  3 mL Intravenous Q12H   Continuous Infusions: . sodium chloride 10 mL/hr at 07/19/17 0620  . heparin 1,300 Units/hr (07/19/17 0620)  . nitroGLYCERIN 23 mcg/min (07/19/17 0620)   PRN Meds: sodium chloride, acetaminophen, albuterol, nitroGLYCERIN, ondansetron (ZOFRAN) IV, sodium chloride flush   Vital Signs    Vitals:   07/19/17 0630 07/19/17 0700 07/19/17 0718 07/19/17 0730  BP: 115/71 (!) 153/94 127/72 122/72  Pulse: (!) 53 69 70 68  Resp: 15 16 19 16   Temp:   98.6 F (37 C)   TempSrc:   Oral   SpO2:   93%   Weight:      Height:        Intake/Output Summary (Last 24 hours) at 07/19/2017 0942 Last data filed at 07/19/2017 0651 Gross per 24 hour  Intake 1686.47 ml  Output 600 ml  Net 1086.47 ml   Filed Weights   07/17/17 1102 07/19/17 0620  Weight: 216 lb (98 kg) 219 lb (99.3 kg)    Telemetry    NSR - Personally Reviewed  ECG    NSR, nonspecific T wave flattening - Personally Reviewed  Physical Exam  Quiet, worried look GEN: No acute distress.   Neck: No JVD Cardiac: RRR, no murmurs, rubs, or gallops.  Respiratory: Clear to auscultation bilaterally. GI: Soft, nontender, non-distended  MS: No edema; No deformity. Neuro:  Nonfocal  Psych: Normal affect   Labs    Chemistry Recent Labs  Lab 07/16/17 1450 07/18/17 0249  NA 143 141  K 4.7 4.2  CL 102 107  CO2 24 24  GLUCOSE 91 148*  BUN 13 17  CREATININE 1.01  1.17  CALCIUM 10.1 9.4  PROT  --  6.7  ALBUMIN  --  4.1  AST  --  32  ALT  --  33  ALKPHOS  --  64  BILITOT  --  0.9  GFRNONAA 77 >60  GFRAA 89 >60  ANIONGAP  --  10     Hematology Recent Labs  Lab 07/16/17 1450 07/18/17 0249 07/19/17 0334  WBC 10.5 12.2* 15.1*  RBC 5.52 5.23 4.90  HGB 16.5 15.2 14.3  HCT 47.6 46.3 44.5  MCV 86 88.5 90.8  MCH 29.9 29.1 29.2  MCHC 34.7 32.8 32.1  RDW 14.4 13.3 13.8  PLT 241 216 187    Cardiac EnzymesNo results for input(s): TROPONINI in the last 168 hours. No results for input(s): TROPIPOC in the last 168 hours.   BNPNo results for input(s): BNP, PROBNP in the last 168 hours.   DDimer No results for input(s): DDIMER in the last 168 hours.   Radiology    Dg Chest 2 View  Result Date: 07/18/2017 CLINICAL DATA:  Chest pain and shortness of breath last night, now resolved. EXAM: CHEST - 2 VIEW COMPARISON:  Chest x-ray dated July 06, 2017. FINDINGS: The  heart size and mediastinal contours are within normal limits. Both lungs are clear. The visualized skeletal structures are unremarkable. IMPRESSION: No active cardiopulmonary disease. Electronically Signed   By: Obie Dredge M.D.   On: 07/18/2017 08:03    Cardiac Studies    07/17/2017 ECHO Left ventricle: The cavity size was normal. There was mild concentric hypertrophy. Systolic function was normal. The estimated ejection fraction was in the range of 55% to 60%. Wall motion was normal; there were no regional wall motion abnormalities. Doppler parameters are consistent with abnormal left ventricular relaxation (grade 1 diastolic dysfunction). Acoustic contrast opacification revealed no evidence ofthrombus.  07/17/2017 CATH Conclusions: 1. Severe multivessel coronary artery disease, as detailed below. Most critical lesions are 95% ostial LAD stenosis and sequential 80% and 95% mid/distal RCA and rPDA stenoses. 2. Normal to mildly elevated left ventricular filling  pressure. 3. Low normal left ventricular contraction with subtle inferior hypokinesis. LVEF 50-55%.  Recommendations: 1. Due to intermittent angina during procedure relieved with sublingual and IV nitroglycerin, patient will be admitted to stepdown. 2. Cardiac surgery consultation for CABG evaluation. His LAD disease is best suited to bypass. However, given suboptimal targets for OM2 and RCA/rPDA, a hybrid approach with LIMA-LAD and PCI to RCA/rPDA may need to be considered. Recommend Heart Team approach to optimize revascularization. 3. Aggressive secondary prevention, including high-intensity statin therapy. 4. Start heparin infusion 4 hours after sheath removal. 5. Titrate nitroglycerin gtt for relief of chest pain. 6. Obtain transthoracic echocardiogram to assess for structural abnormalities.    Patient Profile     67 y.o. male with long-standing hyperlipidemia and hypertension admitted with unstable angina and found to have severe multivessel coronary artery disease, preserved left ventricular systolic function. Angina at rest, nitrate dependent.  Assessment & Plan    1. CAD with [place]: Plan for CABG with Dr. Tyrone Sage tomorrow, 2nd case. May need PCI to distal vessels down the road, if some targets are not graftable. Add beta blocker for recurrent angina. No wheezing. 2. HTN: Excellent control 3. HLP: LDL not quite at target on statin, dose increased.  For questions or updates, please contact [facility] Please consult [email or web address] for contact info under Cardiology/STEMI.      Signed, Thurmon Fair, MD  07/19/2017, 9:42 AM

## 2017-07-19 NOTE — Progress Notes (Signed)
Pre-op Cardiac Surgery  Carotid Findings:  No significant stenosis noted.  Vertebral artery flow is antegrade.   Upper Extremity Right Left  Brachial Pressures 126T 127T  Radial Waveforms T T  Ulnar Waveforms T T  Palmar Arch (Allen's Test) WNL WNL   Findings:      Lower  Extremity Right Left  Dorsalis Pedis T T  Anterior Tibial    Posterior Tibial T T  Ankle/Brachial Indices      Findings:

## 2017-07-20 ENCOUNTER — Inpatient Hospital Stay (HOSPITAL_COMMUNITY): Payer: Medicare Other | Admitting: Anesthesiology

## 2017-07-20 ENCOUNTER — Encounter (HOSPITAL_COMMUNITY)
Admission: RE | Disposition: A | Discharge status: Home / Self Care | Payer: Self-pay | Source: Ambulatory Visit | Attending: Cardiothoracic Surgery

## 2017-07-20 ENCOUNTER — Inpatient Hospital Stay (HOSPITAL_COMMUNITY): Payer: Medicare Other

## 2017-07-20 ENCOUNTER — Encounter (HOSPITAL_COMMUNITY): Payer: Self-pay | Admitting: Internal Medicine

## 2017-07-20 DIAGNOSIS — I200 Unstable angina: Secondary | ICD-10-CM

## 2017-07-20 DIAGNOSIS — Z951 Presence of aortocoronary bypass graft: Secondary | ICD-10-CM

## 2017-07-20 HISTORY — PX: CORONARY ARTERY BYPASS GRAFT: SHX141

## 2017-07-20 HISTORY — DX: Presence of aortocoronary bypass graft: Z95.1

## 2017-07-20 LAB — PULMONARY FUNCTION TEST
FEF 25-75 Post: 3.46 L/sec
FEF 25-75 Pre: 3.74 L/sec
FEF2575-%Change-Post: -7 %
FEF2575-%Pred-Post: 134 %
FEF2575-%Pred-Pre: 145 %
FEV1-%Change-Post: -1 %
FEV1-%Pred-Post: 83 %
FEV1-%Pred-Pre: 84 %
FEV1-Post: 2.77 L
FEV1-Pre: 2.83 L
FEV1FVC-%Change-Post: -1 %
FEV1FVC-%Pred-Pre: 115 %
FEV6-%Change-Post: 0 %
FEV6-%Pred-Post: 76 %
FEV6-%Pred-Pre: 76 %
FEV6-Post: 3.28 L
FEV6-Pre: 3.28 L
FEV6FVC-%Pred-Post: 105 %
FEV6FVC-%Pred-Pre: 105 %
FVC-%Change-Post: 0 %
FVC-%Pred-Post: 72 %
FVC-%Pred-Pre: 73 %
FVC-Post: 3.28 L
Post FEV1/FVC ratio: 85 %
Post FEV6/FVC ratio: 100 %
Pre FEV1/FVC ratio: 86 %
Pre FEV6/FVC Ratio: 100 %

## 2017-07-20 LAB — GLUCOSE, CAPILLARY
Glucose-Capillary: 132 mg/dL — ABNORMAL HIGH (ref 65–99)
Glucose-Capillary: 129 mg/dL — ABNORMAL HIGH (ref 65–99)
Glucose-Capillary: 130 mg/dL — ABNORMAL HIGH (ref 65–99)

## 2017-07-20 LAB — POCT I-STAT, CHEM 8
BUN: 17 mg/dL (ref 6–20)
BUN: 15 mg/dL (ref 6–20)
BUN: 13 mg/dL (ref 6–20)
BUN: 14 mg/dL (ref 6–20)
BUN: 14 mg/dL (ref 6–20)
BUN: 16 mg/dL (ref 6–20)
Calcium, Ion: 1.22 mmol/L (ref 1.15–1.40)
Calcium, Ion: 1.18 mmol/L (ref 1.15–1.40)
Calcium, Ion: 1.06 mmol/L — ABNORMAL LOW (ref 1.15–1.40)
Calcium, Ion: 1.09 mmol/L — ABNORMAL LOW (ref 1.15–1.40)
Calcium, Ion: 1.1 mmol/L — ABNORMAL LOW (ref 1.15–1.40)
Calcium, Ion: 1.1 mmol/L — ABNORMAL LOW (ref 1.15–1.40)
Chloride: 102 mmol/L (ref 101–111)
Chloride: 103 mmol/L (ref 101–111)
Chloride: 100 mmol/L — ABNORMAL LOW (ref 101–111)
Chloride: 100 mmol/L — ABNORMAL LOW (ref 101–111)
Chloride: 102 mmol/L (ref 101–111)
Chloride: 102 mmol/L (ref 101–111)
Creatinine, Ser: 0.9 mg/dL (ref 0.61–1.24)
Creatinine, Ser: 0.9 mg/dL (ref 0.61–1.24)
Creatinine, Ser: 0.7 mg/dL (ref 0.61–1.24)
Creatinine, Ser: 0.8 mg/dL (ref 0.61–1.24)
Creatinine, Ser: 0.8 mg/dL (ref 0.61–1.24)
Creatinine, Ser: 0.8 mg/dL (ref 0.61–1.24)
Glucose, Bld: 108 mg/dL — ABNORMAL HIGH (ref 65–99)
Glucose, Bld: 109 mg/dL — ABNORMAL HIGH (ref 65–99)
Glucose, Bld: 100 mg/dL — ABNORMAL HIGH (ref 65–99)
Glucose, Bld: 129 mg/dL — ABNORMAL HIGH (ref 65–99)
Glucose, Bld: 133 mg/dL — ABNORMAL HIGH (ref 65–99)
Glucose, Bld: 143 mg/dL — ABNORMAL HIGH (ref 65–99)
HCT: 38 % — ABNORMAL LOW (ref 39.0–52.0)
HCT: 38 % — ABNORMAL LOW (ref 39.0–52.0)
HCT: 34 % — ABNORMAL LOW (ref 39.0–52.0)
HCT: 31 % — ABNORMAL LOW (ref 39.0–52.0)
HCT: 32 % — ABNORMAL LOW (ref 39.0–52.0)
HCT: 33 % — ABNORMAL LOW (ref 39.0–52.0)
Hemoglobin: 12.9 g/dL — ABNORMAL LOW (ref 13.0–17.0)
Hemoglobin: 12.9 g/dL — ABNORMAL LOW (ref 13.0–17.0)
Hemoglobin: 11.6 g/dL — ABNORMAL LOW (ref 13.0–17.0)
Hemoglobin: 10.5 g/dL — ABNORMAL LOW (ref 13.0–17.0)
Hemoglobin: 10.9 g/dL — ABNORMAL LOW (ref 13.0–17.0)
Hemoglobin: 11.2 g/dL — ABNORMAL LOW (ref 13.0–17.0)
Potassium: 3.9 mmol/L (ref 3.5–5.1)
Potassium: 3.8 mmol/L (ref 3.5–5.1)
Potassium: 4 mmol/L (ref 3.5–5.1)
Potassium: 4.3 mmol/L (ref 3.5–5.1)
Potassium: 3.9 mmol/L (ref 3.5–5.1)
Potassium: 4.7 mmol/L (ref 3.5–5.1)
Sodium: 139 mmol/L (ref 135–145)
Sodium: 138 mmol/L (ref 135–145)
Sodium: 139 mmol/L (ref 135–145)
Sodium: 135 mmol/L (ref 135–145)
Sodium: 135 mmol/L (ref 135–145)
Sodium: 138 mmol/L (ref 135–145)
TCO2: 25 mmol/L (ref 22–32)
TCO2: 21 mmol/L — ABNORMAL LOW (ref 22–32)
TCO2: 24 mmol/L (ref 22–32)
TCO2: 26 mmol/L (ref 22–32)
TCO2: 23 mmol/L (ref 22–32)
TCO2: 24 mmol/L (ref 22–32)

## 2017-07-20 LAB — HEMOGLOBIN A1C
Hgb A1c MFr Bld: 5.9 % — ABNORMAL HIGH (ref 4.8–5.6)
Mean Plasma Glucose: 122.63 mg/dL

## 2017-07-20 LAB — BASIC METABOLIC PANEL
Anion gap: 10 (ref 5–15)
BUN: 19 mg/dL (ref 6–20)
CO2: 25 mmol/L (ref 22–32)
Calcium: 9.3 mg/dL (ref 8.9–10.3)
Chloride: 104 mmol/L (ref 101–111)
Creatinine, Ser: 1.08 mg/dL (ref 0.61–1.24)
GFR calc Af Amer: >60 mL/min (ref >60)
GFR calc non Af Amer: >60 mL/min (ref >60)
Glucose, Bld: 112 mg/dL — ABNORMAL HIGH (ref 65–99)
Potassium: 4.4 mmol/L (ref 3.5–5.1)
Sodium: 139 mmol/L (ref 135–145)

## 2017-07-20 LAB — POCT I-STAT 3, ART BLOOD GAS (G3+)
Acid-base deficit: 1 mmol/L (ref 0.0–2.0)
Acid-base deficit: 1 mmol/L (ref 0.0–2.0)
Acid-base deficit: 4 mmol/L — ABNORMAL HIGH (ref 0.0–2.0)
Bicarbonate: 24.6 mmol/L (ref 20.0–28.0)
Bicarbonate: 25.6 mmol/L (ref 20.0–28.0)
Bicarbonate: 23.2 mmol/L (ref 20.0–28.0)
Bicarbonate: 21.3 mmol/L (ref 20.0–28.0)
O2 Saturation: 100 %
O2 Saturation: 100 %
O2 Saturation: 100 %
O2 Saturation: 96 %
Patient temperature: 36
TCO2: 26 mmol/L (ref 22–32)
TCO2: 27 mmol/L (ref 22–32)
TCO2: 24 mmol/L (ref 22–32)
TCO2: 22 mmol/L (ref 22–32)
pCO2 arterial: 42.1 mmHg (ref 32.0–48.0)
pCO2 arterial: 46.5 mmHg (ref 32.0–48.0)
pCO2 arterial: 35.9 mmHg (ref 32.0–48.0)
pCO2 arterial: 37 mmHg (ref 32.0–48.0)
pH, Arterial: 7.348 — ABNORMAL LOW (ref 7.350–7.450)
pH, Arterial: 7.374 (ref 7.350–7.450)
pH, Arterial: 7.418 (ref 7.350–7.450)
pH, Arterial: 7.364 (ref 7.350–7.450)
pO2, Arterial: 301 mmHg — ABNORMAL HIGH (ref 83.0–108.0)
pO2, Arterial: 371 mmHg — ABNORMAL HIGH (ref 83.0–108.0)
pO2, Arterial: 268 mmHg — ABNORMAL HIGH (ref 83.0–108.0)
pO2, Arterial: 82 mmHg — ABNORMAL LOW (ref 83.0–108.0)

## 2017-07-20 LAB — CBC
HCT: 43.6 % (ref 39.0–52.0)
Hemoglobin: 14.3 g/dL (ref 13.0–17.0)
MCH: 29.6 pg (ref 26.0–34.0)
MCHC: 32.8 g/dL (ref 30.0–36.0)
MCV: 90.3 fL (ref 78.0–100.0)
Platelets: 175 10*3/uL (ref 150–400)
RBC: 4.83 MIL/uL (ref 4.22–5.81)
RDW: 13.8 % (ref 11.5–15.5)
WBC: 13 10*3/uL — ABNORMAL HIGH (ref 4.0–10.5)

## 2017-07-20 LAB — HEMOGLOBIN AND HEMATOCRIT, BLOOD
HCT: 34.2 % — ABNORMAL LOW (ref 39.0–52.0)
Hemoglobin: 11.1 g/dL — ABNORMAL LOW (ref 13.0–17.0)

## 2017-07-20 LAB — PLATELET COUNT: Platelets: 143 10*3/uL — ABNORMAL LOW (ref 150–400)

## 2017-07-20 LAB — HEPARIN LEVEL (UNFRACTIONATED): Heparin Unfractionated: 0.4 IU/mL (ref 0.30–0.70)

## 2017-07-20 SURGERY — CORONARY ARTERY BYPASS GRAFTING (CABG)
Anesthesia: General | Site: Chest

## 2017-07-20 MED ORDER — FENTANYL CITRATE (PF) 250 MCG/5ML IJ SOLN
INTRAMUSCULAR | Status: AC
  Filled 2017-07-20: qty 20

## 2017-07-20 MED ORDER — POTASSIUM CHLORIDE 10 MEQ/50ML IV SOLN
10.0000 meq | INTRAVENOUS | Status: AC
  Administered 2017-07-20 (×2): 10 meq via INTRAVENOUS

## 2017-07-20 MED ORDER — FENTANYL CITRATE (PF) 100 MCG/2ML IJ SOLN
INTRAMUSCULAR | Status: DC | PRN
  Administered 2017-07-20 (×3): 250 ug via INTRAVENOUS
  Administered 2017-07-20: 100 ug via INTRAVENOUS
  Administered 2017-07-20 (×2): 50 ug via INTRAVENOUS
  Administered 2017-07-20: 200 ug via INTRAVENOUS
  Administered 2017-07-20: 250 ug via INTRAVENOUS
  Administered 2017-07-20: 200 ug via INTRAVENOUS

## 2017-07-20 MED ORDER — PROTAMINE SULFATE 10 MG/ML IV SOLN
INTRAVENOUS | Status: DC | PRN
  Administered 2017-07-20: 100 mg via INTRAVENOUS
  Administered 2017-07-20: 20 mg via INTRAVENOUS
  Administered 2017-07-20: 170 mg via INTRAVENOUS
  Administered 2017-07-20: 50 mg via INTRAVENOUS

## 2017-07-20 MED ORDER — 0.9 % SODIUM CHLORIDE (POUR BTL) OPTIME
TOPICAL | Status: DC | PRN
  Administered 2017-07-20: 1000 mL

## 2017-07-20 MED ORDER — OXYCODONE HCL 5 MG PO TABS
5.0000 mg | ORAL_TABLET | ORAL | Status: DC | PRN
  Administered 2017-07-21: 5 mg via ORAL
  Administered 2017-07-22: 10 mg via ORAL
  Filled 2017-07-20 (×2): qty 2

## 2017-07-20 MED ORDER — FENTANYL CITRATE (PF) 250 MCG/5ML IJ SOLN
INTRAMUSCULAR | Status: AC
  Filled 2017-07-20: qty 5

## 2017-07-20 MED ORDER — ARTIFICIAL TEARS OPHTHALMIC OINT
TOPICAL_OINTMENT | OPHTHALMIC | Status: AC
  Filled 2017-07-20: qty 3.5

## 2017-07-20 MED ORDER — DEXMEDETOMIDINE HCL IN NACL 200 MCG/50ML IV SOLN
0.4000 ug/kg/h | INTRAVENOUS | Status: DC
  Administered 2017-07-20: 1.2 ug/kg/h via INTRAVENOUS
  Filled 2017-07-20: qty 50

## 2017-07-20 MED ORDER — ASPIRIN EC 325 MG PO TBEC
325.0000 mg | DELAYED_RELEASE_TABLET | Freq: Every day | ORAL | Status: DC
  Administered 2017-07-21 – 2017-07-27 (×6): 325 mg via ORAL
  Filled 2017-07-20 (×6): qty 1

## 2017-07-20 MED ORDER — SODIUM CHLORIDE 0.9 % IV SOLN
INTRAVENOUS | Status: DC | PRN
  Administered 2017-07-20: 750 mg via INTRAVENOUS

## 2017-07-20 MED ORDER — MAGNESIUM SULFATE 4 GM/100ML IV SOLN
4.0000 g | Freq: Once | INTRAVENOUS | Status: AC
  Administered 2017-07-20: 4 g via INTRAVENOUS
  Filled 2017-07-20: qty 100

## 2017-07-20 MED ORDER — MIDAZOLAM HCL 5 MG/5ML IJ SOLN
INTRAMUSCULAR | Status: DC | PRN
  Administered 2017-07-20 (×3): 2 mg via INTRAVENOUS
  Administered 2017-07-20: 1 mg via INTRAVENOUS

## 2017-07-20 MED ORDER — MORPHINE SULFATE (PF) 2 MG/ML IV SOLN
1.0000 mg | INTRAVENOUS | Status: AC | PRN
  Filled 2017-07-20: qty 2

## 2017-07-20 MED ORDER — PROPOFOL 10 MG/ML IV BOLUS
INTRAVENOUS | Status: DC | PRN
  Administered 2017-07-20: 30 mg via INTRAVENOUS
  Administered 2017-07-20 (×2): 10 mg via INTRAVENOUS

## 2017-07-20 MED ORDER — LACTATED RINGERS IV SOLN
INTRAVENOUS | Status: DC | PRN
  Administered 2017-07-20: 14:00:00 via INTRAVENOUS

## 2017-07-20 MED ORDER — ALBUMIN HUMAN 5 % IV SOLN
INTRAVENOUS | Status: DC | PRN
  Administered 2017-07-20: 19:00:00 via INTRAVENOUS

## 2017-07-20 MED ORDER — SODIUM CHLORIDE 0.9 % IV SOLN
250.0000 mL | INTRAVENOUS | Status: DC
  Administered 2017-07-21: 250 mL via INTRAVENOUS

## 2017-07-20 MED ORDER — METOPROLOL TARTRATE 25 MG/10 ML ORAL SUSPENSION
12.5000 mg | Freq: Two times a day (BID) | ORAL | Status: DC
  Administered 2017-07-20: 12.5 mg
  Filled 2017-07-20: qty 5

## 2017-07-20 MED ORDER — LACTATED RINGERS IV SOLN: 500.0000 mL | Freq: Once | INTRAVENOUS | Status: DC | PRN

## 2017-07-20 MED ORDER — MUPIROCIN 2 % EX OINT
TOPICAL_OINTMENT | Freq: Two times a day (BID) | CUTANEOUS | Status: DC
  Administered 2017-07-20: 12:00:00 via NASAL

## 2017-07-20 MED ORDER — ALBUMIN HUMAN 5 % IV SOLN
250.0000 mL | INTRAVENOUS | Status: AC | PRN
  Administered 2017-07-21: 250 mL via INTRAVENOUS

## 2017-07-20 MED ORDER — MORPHINE SULFATE (PF) 2 MG/ML IV SOLN
2.0000 mg | INTRAVENOUS | Status: DC | PRN
  Administered 2017-07-21 (×2): 2 mg via INTRAVENOUS
  Filled 2017-07-20: qty 1

## 2017-07-20 MED ORDER — LIDOCAINE HCL (CARDIAC) PF 100 MG/5ML IV SOSY
PREFILLED_SYRINGE | INTRAVENOUS | Status: DC | PRN
  Administered 2017-07-20: 80 mg via INTRAVENOUS

## 2017-07-20 MED ORDER — ACETAMINOPHEN 160 MG/5ML PO SOLN
1000.0000 mg | Freq: Four times a day (QID) | ORAL | Status: AC
Start: 2017-07-21 — End: 2017-07-25
  Administered 2017-07-20: 1000 mg
  Filled 2017-07-20: qty 40.6

## 2017-07-20 MED ORDER — ACETAMINOPHEN 160 MG/5ML PO SOLN: 650.0000 mg | Freq: Once | ORAL | Status: AC

## 2017-07-20 MED ORDER — BISACODYL 10 MG RE SUPP: 10.0000 mg | Freq: Every day | RECTAL | Status: DC

## 2017-07-20 MED ORDER — ONDANSETRON HCL 4 MG/2ML IJ SOLN
4.0000 mg | Freq: Four times a day (QID) | INTRAMUSCULAR | Status: DC | PRN
  Administered 2017-07-21 – 2017-07-22 (×2): 4 mg via INTRAVENOUS
  Filled 2017-07-20 (×2): qty 2

## 2017-07-20 MED ORDER — TRAMADOL HCL 50 MG PO TABS
50.0000 mg | ORAL_TABLET | ORAL | Status: DC | PRN
  Administered 2017-07-21 – 2017-07-23 (×3): 100 mg via ORAL
  Administered 2017-07-26: 50 mg via ORAL
  Filled 2017-07-20 (×4): qty 2

## 2017-07-20 MED ORDER — BISACODYL 5 MG PO TBEC
10.0000 mg | DELAYED_RELEASE_TABLET | Freq: Every day | ORAL | Status: DC
  Administered 2017-07-21 – 2017-07-23 (×3): 10 mg via ORAL
  Filled 2017-07-20 (×5): qty 2

## 2017-07-20 MED ORDER — SODIUM CHLORIDE 0.9% FLUSH: 3.0000 mL | INTRAVENOUS | Status: DC | PRN

## 2017-07-20 MED ORDER — FAMOTIDINE IN NACL 20-0.9 MG/50ML-% IV SOLN
20.0000 mg | Freq: Two times a day (BID) | INTRAVENOUS | Status: AC
  Administered 2017-07-20 – 2017-07-21 (×2): 20 mg via INTRAVENOUS
  Filled 2017-07-20: qty 50

## 2017-07-20 MED ORDER — SODIUM CHLORIDE 0.9% FLUSH
3.0000 mL | Freq: Two times a day (BID) | INTRAVENOUS | Status: DC
  Administered 2017-07-21 – 2017-07-23 (×3): 3 mL via INTRAVENOUS

## 2017-07-20 MED ORDER — PROPOFOL 10 MG/ML IV BOLUS
INTRAVENOUS | Status: AC
  Filled 2017-07-20: qty 20

## 2017-07-20 MED ORDER — CHLORHEXIDINE GLUCONATE 0.12 % MT SOLN
15.0000 mL | OROMUCOSAL | Status: AC
  Administered 2017-07-20: 15 mL via OROMUCOSAL

## 2017-07-20 MED ORDER — PROTAMINE SULFATE 10 MG/ML IV SOLN
INTRAVENOUS | Status: AC
  Filled 2017-07-20: qty 25

## 2017-07-20 MED ORDER — MIDAZOLAM HCL 2 MG/2ML IJ SOLN
2.0000 mg | INTRAMUSCULAR | Status: DC | PRN
  Administered 2017-07-20 (×2): 2 mg via INTRAVENOUS
  Filled 2017-07-20 (×2): qty 2

## 2017-07-20 MED ORDER — SODIUM CHLORIDE 0.45 % IV SOLN
INTRAVENOUS | Status: DC | PRN
  Administered 2017-07-20: 20 mL via INTRAVENOUS

## 2017-07-20 MED ORDER — PANTOPRAZOLE SODIUM 40 MG PO TBEC
40.0000 mg | DELAYED_RELEASE_TABLET | Freq: Every day | ORAL | Status: DC
  Administered 2017-07-22 – 2017-07-24 (×3): 40 mg via ORAL
  Filled 2017-07-20 (×4): qty 1

## 2017-07-20 MED ORDER — MIDAZOLAM HCL 10 MG/2ML IJ SOLN
INTRAMUSCULAR | Status: AC
  Filled 2017-07-20: qty 2

## 2017-07-20 MED ORDER — ACETAMINOPHEN 650 MG RE SUPP
650.0000 mg | Freq: Once | RECTAL | Status: AC
  Administered 2017-07-20: 650 mg via RECTAL

## 2017-07-20 MED ORDER — ACETAMINOPHEN 500 MG PO TABS
1000.0000 mg | ORAL_TABLET | Freq: Four times a day (QID) | ORAL | Status: AC
  Administered 2017-07-21 – 2017-07-24 (×12): 1000 mg via ORAL
  Filled 2017-07-20 (×13): qty 2

## 2017-07-20 MED ORDER — ORAL CARE MOUTH RINSE
15.0000 mL | OROMUCOSAL | Status: DC
  Administered 2017-07-21: 15 mL via OROMUCOSAL

## 2017-07-20 MED ORDER — SODIUM CHLORIDE 0.9 % IV SOLN
INTRAVENOUS | Status: DC
  Administered 2017-07-20: 21:00:00 via INTRAVENOUS

## 2017-07-20 MED ORDER — DOCUSATE SODIUM 100 MG PO CAPS
200.0000 mg | ORAL_CAPSULE | Freq: Every day | ORAL | Status: DC
  Administered 2017-07-21 – 2017-07-23 (×3): 200 mg via ORAL
  Filled 2017-07-20 (×6): qty 2

## 2017-07-20 MED ORDER — ASPIRIN 81 MG PO CHEW
324.0000 mg | CHEWABLE_TABLET | Freq: Every day | ORAL | Status: DC
  Administered 2017-07-24: 324 mg
  Filled 2017-07-20: qty 4

## 2017-07-20 MED ORDER — TRANEXAMIC ACID 1000 MG/10ML IV SOLN
1.5000 mg/kg/h | INTRAVENOUS | Status: DC
  Filled 2017-07-20: qty 25

## 2017-07-20 MED ORDER — DEXMEDETOMIDINE HCL IN NACL 400 MCG/100ML IV SOLN
0.4000 ug/kg/h | INTRAVENOUS | Status: DC
  Administered 2017-07-20: 1.2 ug/kg/h via INTRAVENOUS
  Filled 2017-07-20: qty 100

## 2017-07-20 MED ORDER — ARTIFICIAL TEARS OPHTHALMIC OINT
TOPICAL_OINTMENT | OPHTHALMIC | Status: DC | PRN
  Administered 2017-07-20: 1 via OPHTHALMIC

## 2017-07-20 MED ORDER — SODIUM CHLORIDE 0.9 % IV SOLN
1.5000 g | Freq: Two times a day (BID) | INTRAVENOUS | Status: AC
  Administered 2017-07-20 – 2017-07-22 (×4): 1.5 g via INTRAVENOUS
  Filled 2017-07-20 (×4): qty 1.5

## 2017-07-20 MED ORDER — SODIUM CHLORIDE 0.9 % IV SOLN
INTRAVENOUS | Status: DC
  Filled 2017-07-20: qty 1

## 2017-07-20 MED ORDER — INSULIN REGULAR BOLUS VIA INFUSION
0.0000 [IU] | Freq: Three times a day (TID) | INTRAVENOUS | Status: DC
  Administered 2017-07-21: 3.8 [IU] via INTRAVENOUS
  Administered 2017-07-21: 2 [IU] via INTRAVENOUS
  Filled 2017-07-20: qty 10

## 2017-07-20 MED ORDER — ROCURONIUM BROMIDE 100 MG/10ML IV SOLN
INTRAVENOUS | Status: DC | PRN
  Administered 2017-07-20 (×2): 50 mg via INTRAVENOUS
  Administered 2017-07-20: 80 mg via INTRAVENOUS

## 2017-07-20 MED ORDER — ALBUTEROL SULFATE (2.5 MG/3ML) 0.083% IN NEBU
2.5000 mg | INHALATION_SOLUTION | Freq: Once | RESPIRATORY_TRACT | Status: AC
  Administered 2017-07-20: 2.5 mg via RESPIRATORY_TRACT

## 2017-07-20 MED ORDER — SODIUM CHLORIDE 0.9 % IV SOLN
0.0000 ug/min | INTRAVENOUS | Status: DC
  Filled 2017-07-20: qty 2

## 2017-07-20 MED ORDER — HEMOSTATIC AGENTS (NO CHARGE) OPTIME
TOPICAL | Status: DC | PRN
  Administered 2017-07-20: 1 via TOPICAL

## 2017-07-20 MED ORDER — METOPROLOL TARTRATE 5 MG/5ML IV SOLN: 2.5000 mg | INTRAVENOUS | Status: DC | PRN

## 2017-07-20 MED ORDER — NITROGLYCERIN IN D5W 200-5 MCG/ML-% IV SOLN: 0.0000 ug/min | INTRAVENOUS | Status: DC

## 2017-07-20 MED ORDER — MUPIROCIN 2 % EX OINT
TOPICAL_OINTMENT | CUTANEOUS | Status: AC
  Filled 2017-07-20: qty 22

## 2017-07-20 MED ORDER — CHLORHEXIDINE GLUCONATE 0.12% ORAL RINSE (MEDLINE KIT)
15.0000 mL | Freq: Two times a day (BID) | OROMUCOSAL | Status: DC
  Administered 2017-07-21: 15 mL via OROMUCOSAL

## 2017-07-20 MED ORDER — LACTATED RINGERS IV SOLN: INTRAVENOUS | Status: DC

## 2017-07-20 MED ORDER — HEMOSTATIC AGENTS (NO CHARGE) OPTIME
TOPICAL | Status: DC | PRN
  Administered 2017-07-20 (×2): 1 via TOPICAL

## 2017-07-20 MED ORDER — METOPROLOL TARTRATE 12.5 MG HALF TABLET
12.5000 mg | ORAL_TABLET | Freq: Two times a day (BID) | ORAL | Status: DC
  Administered 2017-07-21 (×2): 12.5 mg via ORAL
  Filled 2017-07-20 (×2): qty 1

## 2017-07-20 MED ORDER — VANCOMYCIN HCL IN DEXTROSE 1-5 GM/200ML-% IV SOLN
1000.0000 mg | Freq: Once | INTRAVENOUS | Status: AC
  Administered 2017-07-21: 1000 mg via INTRAVENOUS
  Filled 2017-07-20: qty 200

## 2017-07-20 MED ORDER — LACTATED RINGERS IV SOLN
INTRAVENOUS | Status: DC
  Administered 2017-07-21: 10 mL/h via INTRAVENOUS

## 2017-07-20 MED ORDER — HEPARIN SODIUM (PORCINE) 1000 UNIT/ML IJ SOLN
INTRAMUSCULAR | Status: DC | PRN
  Administered 2017-07-20: 34000 [IU] via INTRAVENOUS

## 2017-07-20 SURGICAL SUPPLY — 71 items
BAG DECANTER FOR FLEXI CONT (MISCELLANEOUS) ×2 IMPLANT
BANDAGE ACE 4X5 VEL STRL LF (GAUZE/BANDAGES/DRESSINGS) ×2 IMPLANT
BANDAGE ACE 6X5 VEL STRL LF (GAUZE/BANDAGES/DRESSINGS) ×2 IMPLANT
BLADE STERNUM SYSTEM 6 (BLADE) ×2 IMPLANT
BNDG GAUZE ELAST 4 BULKY (GAUZE/BANDAGES/DRESSINGS) ×2 IMPLANT
CANISTER SUCT 3000ML PPV (MISCELLANEOUS) ×2 IMPLANT
CATH CPB KIT GERHARDT (MISCELLANEOUS) ×2 IMPLANT
CATH THORACIC 28FR (CATHETERS) ×2 IMPLANT
CRADLE DONUT ADULT HEAD (MISCELLANEOUS) ×2 IMPLANT
DERMABOND ADVANCED (GAUZE/BANDAGES/DRESSINGS) ×1
DERMABOND ADVANCED .7 DNX12 (GAUZE/BANDAGES/DRESSINGS) ×1 IMPLANT
DRAIN CHANNEL 28F RND 3/8 FF (WOUND CARE) ×2 IMPLANT
DRAPE CARDIOVASCULAR INCISE (DRAPES) ×1
DRAPE SLUSH/WARMER DISC (DRAPES) ×2 IMPLANT
DRAPE SRG 135X102X78XABS (DRAPES) ×1 IMPLANT
DRSG AQUACEL AG ADV 3.5X14 (GAUZE/BANDAGES/DRESSINGS) ×2 IMPLANT
ELECT BLADE 4.0 EZ CLEAN MEGAD (MISCELLANEOUS) ×2
ELECT REM PT RETURN 9FT ADLT (ELECTROSURGICAL) ×4
ELECTRODE BLDE 4.0 EZ CLN MEGD (MISCELLANEOUS) ×1 IMPLANT
ELECTRODE REM PT RTRN 9FT ADLT (ELECTROSURGICAL) ×2 IMPLANT
FELT TEFLON 1X6 (MISCELLANEOUS) ×4 IMPLANT
FLOSEAL 5ML (HEMOSTASIS) ×2 IMPLANT
GAUZE SPONGE 4X4 12PLY STRL (GAUZE/BANDAGES/DRESSINGS) ×2 IMPLANT
GLOVE BIO SURGEON STRL SZ 6.5 (GLOVE) ×10 IMPLANT
GLOVE BIO SURGEON STRL SZ7.5 (GLOVE) ×4 IMPLANT
GLOVE BIOGEL PI IND STRL 6.5 (GLOVE) ×4 IMPLANT
GLOVE BIOGEL PI IND STRL 7.0 (GLOVE) ×1 IMPLANT
GLOVE BIOGEL PI IND STRL 8 (GLOVE) ×2 IMPLANT
GLOVE BIOGEL PI INDICATOR 6.5 (GLOVE) ×4
GLOVE BIOGEL PI INDICATOR 7.0 (GLOVE) ×1
GLOVE BIOGEL PI INDICATOR 8 (GLOVE) ×2
GOWN STRL REUS W/ TWL LRG LVL3 (GOWN DISPOSABLE) ×4 IMPLANT
GOWN STRL REUS W/TWL LRG LVL3 (GOWN DISPOSABLE) ×4
HEMOSTAT POWDER SURGIFOAM 1G (HEMOSTASIS) ×6 IMPLANT
HEMOSTAT SURGICEL 2X14 (HEMOSTASIS) ×2 IMPLANT
IV CATH 18G X1.75 CATHLON (IV SOLUTION) ×2 IMPLANT
KIT BASIN OR (CUSTOM PROCEDURE TRAY) ×2 IMPLANT
KIT CATH SUCT 8FR (CATHETERS) ×2 IMPLANT
KIT SUCTION CATH 14FR (SUCTIONS) ×4 IMPLANT
KIT TURNOVER KIT B (KITS) ×2 IMPLANT
KIT VASOVIEW HEMOPRO VH 3000 (KITS) ×2 IMPLANT
LEAD PACING MYOCARDI (MISCELLANEOUS) ×2 IMPLANT
MARKER GRAFT CORONARY BYPASS (MISCELLANEOUS) ×6 IMPLANT
NS IRRIG 1000ML POUR BTL (IV SOLUTION) ×10 IMPLANT
PACK E OPEN HEART (SUTURE) ×2 IMPLANT
PACK OPEN HEART (CUSTOM PROCEDURE TRAY) ×2 IMPLANT
PAD ARMBOARD 7.5X6 YLW CONV (MISCELLANEOUS) ×4 IMPLANT
PAD ELECT DEFIB RADIOL ZOLL (MISCELLANEOUS) ×2 IMPLANT
PENCIL BUTTON HOLSTER BLD 10FT (ELECTRODE) ×2 IMPLANT
PUNCH AORTIC ROT 4.0MM RCL 40 (MISCELLANEOUS) ×2 IMPLANT
PUNCH AORTIC ROTATE  4.5MM 8IN (MISCELLANEOUS) ×2 IMPLANT
SET CARDIOPLEGIA MPS 5001102 (MISCELLANEOUS) ×2 IMPLANT
SPONGE LAP 18X18 X RAY DECT (DISPOSABLE) ×6 IMPLANT
SUT BONE WAX W31G (SUTURE) ×2 IMPLANT
SUT PROLENE 3 0 SH1 36 (SUTURE) ×2 IMPLANT
SUT PROLENE 4 0 TF (SUTURE) ×4 IMPLANT
SUT PROLENE 6 0 CC (SUTURE) ×6 IMPLANT
SUT PROLENE 7 0 BV1 MDA (SUTURE) ×4 IMPLANT
SUT STEEL 6MS V (SUTURE) ×2 IMPLANT
SUT STEEL SZ 6 DBL 3X14 BALL (SUTURE) ×2 IMPLANT
SUT VIC AB 1 CTX 18 (SUTURE) ×4 IMPLANT
SUT VIC AB 2-0 CT1 27 (SUTURE) ×1
SUT VIC AB 2-0 CT1 TAPERPNT 27 (SUTURE) ×1 IMPLANT
SUT VIC AB 3-0 X1 27 (SUTURE) ×2 IMPLANT
SYSTEM SAHARA CHEST DRAIN ATS (WOUND CARE) ×2 IMPLANT
TOWEL GREEN STERILE (TOWEL DISPOSABLE) ×2 IMPLANT
TOWEL GREEN STERILE FF (TOWEL DISPOSABLE) ×2 IMPLANT
TRAY FOLEY SLVR 16FR TEMP STAT (SET/KITS/TRAYS/PACK) ×2 IMPLANT
TUBING INSUFFLATION (TUBING) ×2 IMPLANT
UNDERPAD 30X30 (UNDERPADS AND DIAPERS) ×2 IMPLANT
WATER STERILE IRR 1000ML POUR (IV SOLUTION) ×4 IMPLANT

## 2017-07-20 NOTE — Progress Notes (Signed)
Pt feels bad due to HA. Discussed sternal precautions, IS (did not give), mobility, and d/c planning with pt, wife, and son. Gave [facility] booklet and careguide. Good reception, wife will be with him at d/c.  [phone] Ethelda ChickKristan Shequita Peplinski CES, ACSM 9:41 AM 07/20/2017

## 2017-07-20 NOTE — Progress Notes (Signed)
ANTICOAGULATION CONSULT NOTE   Pharmacy Consult for Heparin Indication: ACS > possible CABG  Allergies  Allergen Reactions  . Codeine Other (See Comments)    Insomnia  . Iodine Rash  . Naprosyn [Naproxen] Rash    Patient Measurements: Height: 5\' 10"  (177.8 cm) Weight: 209 lb 4.8 oz (94.9 kg) IBW/kg (Calculated) : 73 Heparin Dosing Weight: 93 kg  Vital Signs: Temp: 98.9 F (37.2 C) (06/24 0334) Temp Source: Oral (06/24 0334) BP: 113/73 (06/24 0334) Pulse Rate: 70 (06/24 0628)  Labs: Recent Labs    07/18/17 0249 07/18/17 1200 07/19/17 0334 07/20/17 0425  HGB 15.2  --  14.3 14.3  HCT 46.3  --  44.5 43.6  PLT 216  --  187 175  APTT 59*  --   --   --   LABPROT 14.1  --   --   --   INR 1.10  --   --   --   HEPARINUNFRC 0.34 0.63 0.62 0.40  CREATININE 1.17  --   --  1.08      Medical History: Past Medical History:  Diagnosis Date  . Gout   . Hearing loss   . Hypertension   . Reflux      Assessment: 67 yo male admitted with unstable angina. Now s/p cath today, found multi-vessel disease, tentative CABG planned. Pharmacy consulted to bridge to heparin in the meantime.   Heparin level remains therapeutic at 0.4, on 1300 units/hr. Hgb 14.3, plt 175. No s/sx of bleeding. No infusion issues documented.  Goal of Therapy:  Heparin level 0.3-0.7 units/ml Monitor platelets by anticoagulation protocol: Yes   Plan:  Cont heparin at 1300 units/hr Daily heparin level, CBC F/u plans for OR today.  Girard CooterKimberly Perkins, PharmD Clinical Pharmacist  Pager: [phone] Phone: [phone]  07/20/2017 7:10 AM

## 2017-07-20 NOTE — Anesthesia Procedure Notes (Signed)
Procedure Name: Intubation Date/Time: 07/20/2017 2:45 PM Performed by: Tillman AbideHawkins, Zain Lankford B, CRNA Pre-anesthesia Checklist: Patient identified, Emergency Drugs available, Suction available and Patient being monitored Patient Re-evaluated:Patient Re-evaluated prior to induction Oxygen Delivery Method: Circle System Utilized Preoxygenation: Pre-oxygenation with 100% oxygen Induction Type: IV induction Ventilation: Mask ventilation without difficulty Laryngoscope Size: Miller and 3 Grade View: Grade I Tube type: Oral Number of attempts: 1 Airway Equipment and Method: Stylet Placement Confirmation: ETT inserted through vocal cords under direct vision,  positive ETCO2 and breath sounds checked- equal and bilateral Secured at: 23 cm Tube secured with: Tape Dental Injury: Teeth and Oropharynx as per pre-operative assessment

## 2017-07-20 NOTE — Progress Notes (Signed)
Progress Note  Patient Name: Thomas Miller Date of Encounter: 07/20/2017  Primary Cardiologist: No primary care provider on file.   Subjective   Had a good night last night. No angina. Denies SOB. Ready for surgery today.  Inpatient Medications    Scheduled Meds: . allopurinol  300 mg Oral Daily  . aspirin EC  81 mg Oral Daily  . atorvastatin  80 mg Oral q1800  . bisacodyl  5 mg Oral Once  . carvedilol  6.25 mg Oral BID WC  . chlorhexidine  15 mL Mouth/Throat Once  . chlorhexidine  15 mL Mouth/Throat Once  . heparin-papaverine-plasmalyte irrigation   Irrigation To OR  . magnesium sulfate  40 mEq Other To OR  . metoprolol tartrate  12.5 mg Oral Once  . pantoprazole  40 mg Oral Daily  . potassium chloride  80 mEq Other To OR  . sodium chloride flush  3 mL Intravenous Q12H  . tranexamic acid  15 mg/kg Intravenous To OR  . tranexamic acid  2 mg/kg Intracatheter To OR   Continuous Infusions: . sodium chloride 10 mL/hr at 07/20/17 0500  . cefUROXime (ZINACEF)  IV    . cefUROXime (ZINACEF)  IV    . dexmedetomidine    . DOPamine    . epinephrine    . heparin 30,000 units/NS 1000 mL solution for CELLSAVER    . heparin 1,300 Units/hr (07/20/17 0627)  . insulin (NOVOLIN-R) infusion    . milrinone    . nitroGLYCERIN 23 mcg/min (07/20/17 0500)  . nitroGLYCERIN    . phenylephrine 20mg /248mL NS (0.08mg /ml) infusion    . tranexamic acid (CYKLOKAPRON) infusion ([facility])    . vancomycin     PRN Meds: sodium chloride, acetaminophen, albuterol, nitroGLYCERIN, ondansetron (ZOFRAN) IV, sodium chloride flush, temazepam   Vital Signs    Vitals:   07/19/17 2317 07/20/17 0334 07/20/17 0628 07/20/17 0712  BP: 115/72 113/73  124/70  Pulse:   70 69  Resp: 20 14  19   Temp: 99.5 F (37.5 C) 98.9 F (37.2 C)  99 F (37.2 C)  TempSrc: Oral Oral  Oral  SpO2:    92%  Weight:  209 lb 4.8 oz (94.9 kg)    Height:        Intake/Output Summary (Last 24 hours) at 07/20/2017 0751 Last data  filed at 07/20/2017 0624 Gross per 24 hour  Intake 1692.46 ml  Output 1175 ml  Net 517.46 ml   Filed Weights   07/17/17 1102 07/19/17 0620 07/20/17 0334  Weight: 216 lb (98 kg) 219 lb (99.3 kg) 209 lb 4.8 oz (94.9 kg)    Telemetry    NSR - Personally Reviewed  ECG    None today  Physical Exam   GEN: No acute distress.   Neck: No JVD Cardiac: RRR, no murmurs, rubs, or gallops.  Respiratory: Clear to auscultation bilaterally. GI: Soft, nontender, non-distended  MS: No edema; No deformity. Neuro:  Nonfocal  Psych: Normal affect   Labs    Chemistry Recent Labs  Lab 07/16/17 1450 07/18/17 0249 07/20/17 0425  NA 143 141 139  K 4.7 4.2 4.4  CL 102 107 104  CO2 24 24 25   GLUCOSE 91 148* 112*  BUN 13 17 19   CREATININE 1.01 1.17 1.08  CALCIUM 10.1 9.4 9.3  PROT  --  6.7  --   ALBUMIN  --  4.1  --   AST  --  32  --   ALT  --  33  --   ALKPHOS  --  64  --   BILITOT  --  0.9  --   GFRNONAA 77 >60 >60  GFRAA 89 >60 >60  ANIONGAP  --  10 10     Hematology Recent Labs  Lab 07/18/17 0249 07/19/17 0334 07/20/17 0425  WBC 12.2* 15.1* 13.0*  RBC 5.23 4.90 4.83  HGB 15.2 14.3 14.3  HCT 46.3 44.5 43.6  MCV 88.5 90.8 90.3  MCH 29.1 29.2 29.6  MCHC 32.8 32.1 32.8  RDW 13.3 13.8 13.8  PLT 216 187 175    Cardiac EnzymesNo results for input(s): TROPONINI in the last 168 hours. No results for input(s): TROPIPOC in the last 168 hours.   BNPNo results for input(s): BNP, PROBNP in the last 168 hours.   DDimer No results for input(s): DDIMER in the last 168 hours.   Radiology    No results found.  Cardiac Studies    07/17/2017 ECHO Left ventricle: The cavity size was normal. There was mild concentric hypertrophy. Systolic function was normal. The estimated ejection fraction was in the range of 55% to 60%. Wall motion was normal; there were no regional wall motion abnormalities. Doppler parameters are consistent with abnormal left ventricular  relaxation (grade 1 diastolic dysfunction). Acoustic contrast opacification revealed no evidence ofthrombus.  07/17/2017 CATH Conclusions: 1. Severe multivessel coronary artery disease, as detailed below. Most critical lesions are 95% ostial LAD stenosis and sequential 80% and 95% mid/distal RCA and rPDA stenoses. 2. Normal to mildly elevated left ventricular filling pressure. 3. Low normal left ventricular contraction with subtle inferior hypokinesis. LVEF 50-55%.  Recommendations: 1. Due to intermittent angina during procedure relieved with sublingual and IV nitroglycerin, patient will be admitted to stepdown. 2. Cardiac surgery consultation for CABG evaluation. His LAD disease is best suited to bypass. However, given suboptimal targets for OM2 and RCA/rPDA, a hybrid approach with LIMA-LAD and PCI to RCA/rPDA may need to be considered. Recommend Heart Team approach to optimize revascularization. 3. Aggressive secondary prevention, including high-intensity statin therapy. 4. Start heparin infusion 4 hours after sheath removal. 5. Titrate nitroglycerin gtt for relief of chest pain. 6. Obtain transthoracic echocardiogram to assess for structural abnormalities.    Patient Profile     67 y.o. male with long-standing hyperlipidemia and hypertension admitted with unstable angina and found to have severe multivessel coronary artery disease, preserved left ventricular systolic function. Angina at rest, nitrate dependent.  Assessment & Plan    1. CAD with [place]: Plan for CABG with Dr. Tyrone SageGerhardt today 2nd case. May need PCI to distal vessels down the road, if some targets are not graftable. On ASA, Coreg, high dose lipitor, IV Ntg and IV heparin. 2. HTN: Excellent control 3. HLP: LDL 88. Now on high dose lipitor.   For questions or updates, please contact [facility] Please consult [email or web address] for contact info under Cardiology/STEMI.      Signed, Datha Kissinger [place], MD  07/20/2017, 7:51  AM

## 2017-07-20 NOTE — Progress Notes (Signed)
Patient clipped for CABG, given all preop medications and CHG bath. Consent signed in chart.

## 2017-07-20 NOTE — Anesthesia Preprocedure Evaluation (Addendum)
Anesthesia Evaluation  Patient identified by MRN, date of birth, ID band Patient awake    Reviewed: Allergy & Precautions, H&P , NPO status , Patient's Chart, lab work & pertinent test results  Airway Mallampati: II  TM Distance: >3 FB Neck ROM: Full    Dental no notable dental hx. (+) Teeth Intact, Dental Advisory Given   Pulmonary neg pulmonary ROS,    Pulmonary exam normal breath sounds clear to auscultation       Cardiovascular hypertension, + angina + CAD   Rhythm:Regular Rate:Normal     Neuro/Psych negative neurological ROS  negative psych ROS   GI/Hepatic Neg liver ROS, GERD  Medicated and Controlled,  Endo/Other  negative endocrine ROS  Renal/GU negative Renal ROS  negative genitourinary   Musculoskeletal   Abdominal   Peds  Hematology negative hematology ROS (+)   Anesthesia Other Findings   Reproductive/Obstetrics negative OB ROS                            Anesthesia Physical Anesthesia Plan  ASA: IV  Anesthesia Plan: General   Post-op Pain Management:    Induction: Intravenous  PONV Risk Score and Plan: 2 and Midazolam and Treatment may vary due to age or medical condition  Airway Management Planned: Oral ETT  Additional Equipment: Arterial line, CVP, PA Cath, TEE and Ultrasound Guidance Line Placement  Intra-op Plan:   Post-operative Plan: Post-operative intubation/ventilation  Informed Consent: I have reviewed the patients History and Physical, chart, labs and discussed the procedure including the risks, benefits and alternatives for the proposed anesthesia with the patient or authorized representative who has indicated his/her understanding and acceptance.   Dental advisory given  Plan Discussed with: CRNA  Anesthesia Plan Comments:         Anesthesia Quick Evaluation

## 2017-07-20 NOTE — Anesthesia Procedure Notes (Signed)
Arterial Line Insertion Start/End6/24/2019 1:45 PM, 07/20/2017 1:55 PM Performed by: Carmela RimaMartinelli, John F, CRNA, CRNA  Patient location: Pre-op. Preanesthetic checklist: patient identified, IV checked, site marked, risks and benefits discussed, surgical consent, monitors and equipment checked, pre-op evaluation and timeout performed Left, radial was placed Catheter size: 20 G Hand hygiene performed , maximum sterile barriers used  and Seldinger technique used Allen's test indicative of satisfactory collateral circulation Attempts: 1 Procedure performed without using ultrasound guided technique. Following insertion, Biopatch and dressing applied. Post procedure assessment: normal  Patient tolerated the procedure well with no immediate complications.

## 2017-07-20 NOTE — Anesthesia Procedure Notes (Signed)
Central Venous Catheter Insertion Performed by: Gaynelle AduFitzgerald, Dain Laseter, MD, anesthesiologist Start/End6/24/2019 1:30 PM, 07/20/2017 1:44 PM Patient location: Pre-op. Preanesthetic checklist: patient identified, IV checked, site marked, risks and benefits discussed, surgical consent, monitors and equipment checked, pre-op evaluation, timeout performed and anesthesia consent Hand hygiene performed  and maximum sterile barriers used  PA cath was placed.Swan type:thermodilution PA Cath depth:50 Procedure performed without using ultrasound guided technique. Attempts: 1 Patient tolerated the procedure well with no immediate complications.

## 2017-07-20 NOTE — Progress Notes (Signed)
Pre Procedure note for inpatients:   Thomas Miller has been scheduled for Procedure(s): CORONARY ARTERY BYPASS GRAFTING (CABG) (N/A) today. The various methods of treatment have been discussed with the patient. After consideration of the risks, benefits and treatment options the patient has consented to the planned procedure.   The patient has been seen and labs reviewed. There are no changes in the patient's condition to prevent proceeding with the planned procedure today.  Recent labs:  Lab Results  Component Value Date   WBC 13.0 (H) 07/20/2017   HGB 14.3 07/20/2017   HCT 43.6 07/20/2017   PLT 175 07/20/2017   GLUCOSE 112 (H) 07/20/2017   CHOL 146 07/18/2017   TRIG 51 07/18/2017   HDL 48 07/18/2017   LDLCALC 88 07/18/2017   ALT 33 07/18/2017   AST 32 07/18/2017   NA 139 07/20/2017   K 4.4 07/20/2017   CL 104 07/20/2017   CREATININE 1.08 07/20/2017   BUN 19 07/20/2017   CO2 25 07/20/2017   TSH 3.340 11/27/2016   INR 1.10 07/18/2017   HGBA1C 5.9 (H) 07/20/2017    Delight OvensEdward B Andros Channing, MD 07/20/2017 1:15 PM

## 2017-07-20 NOTE — Progress Notes (Signed)
*  PRELIMINARY RESULTS* Echocardiogram TEE has been performed by Dr. Maple HudsonMoser.  Thomas Miller, Thomas Miller F 07/20/2017, 3:45 PM

## 2017-07-20 NOTE — Anesthesia Procedure Notes (Signed)
Central Venous Catheter Insertion Performed by: Roderic Palau, MD, anesthesiologist Start/End6/24/2019 1:30 PM, 07/20/2017 1:44 PM Patient location: Pre-op. Preanesthetic checklist: patient identified, IV checked, site marked, risks and benefits discussed, surgical consent, monitors and equipment checked, pre-op evaluation, timeout performed and anesthesia consent Position: Trendelenburg Lidocaine 1% used for infiltration and patient sedated Hand hygiene performed , maximum sterile barriers used  and Seldinger technique used Catheter size: 9 Fr Total catheter length 10. Central line was placed.MAC introducer Procedure performed using ultrasound guided technique. Ultrasound Notes:anatomy identified, needle tip was noted to be adjacent to the nerve/plexus identified, no ultrasound evidence of intravascular and/or intraneural injection and image(s) printed for medical record Attempts: 1 Following insertion, line sutured, dressing applied and Biopatch. Post procedure assessment: blood return through all ports, free fluid flow and no air  Patient tolerated the procedure well with no immediate complications.

## 2017-07-20 NOTE — Progress Notes (Signed)
Patient ID: Michae KavaLarry G Legate, male   DOB: [birth year], 67 y.o.   MRN: [id number] EVENING ROUNDS NOTE :     [address]       [place] [id number]             [phone]                 Day of Surgery Procedure(s) (LRB): CORONARY ARTERY BYPASS GRAFTING (CABG) times four using left internal mammary artery to LAD and endoscopically harvested right saphenous vein graft to distal circumflex, intermediate, and PD (N/A)  Total Length of Stay:  LOS: 3 days  BP 117/77 (BP Location: Right Arm)   Pulse 69   Temp 98.9 F (37.2 C) (Oral)   Resp 18   Ht 5\' 10"  (1.778 m)   Wt 209 lb 4.8 oz (94.9 kg)   SpO2 91%   BMI 30.03 kg/m   .Intake/Output      06/24 0701 - 06/25 0700   P.O. 0   I.V. (mL/kg) 2404.2 (25.3)   Blood 550   IV Piggyback 500   Total Intake(mL/kg) 3454.2 (36.4)   Urine (mL/kg/hr) 720 (0.6)   Blood 700   Total Output 1420   Net +2034.2       Urine Occurrence 2 x     . sodium chloride 20 mL (07/20/17 2010)  . [START ON 07/21/2017] sodium chloride    . sodium chloride    . albumin human    . cefUROXime (ZINACEF)  IV    . dexmedetomidine (PRECEDEX) IV infusion    . famotidine (PEPCID) IV 20 mg (07/20/17 2038)  . insulin (NOVOLIN-R) infusion    . lactated ringers    . lactated ringers    . lactated ringers    . magnesium sulfate    . nitroGLYCERIN    . phenylephrine (NEO-SYNEPHRINE) Adult infusion    . potassium chloride    . [START ON 07/21/2017] vancomycin       Lab Results  Component Value Date   WBC 13.0 (H) 07/20/2017   HGB 11.2 (L) 07/20/2017   HCT 33.0 (L) 07/20/2017   PLT 143 (L) 07/20/2017   GLUCOSE 133 (H) 07/20/2017   CHOL 146 07/18/2017   TRIG 51 07/18/2017   HDL 48 07/18/2017   LDLCALC 88 07/18/2017   ALT 33 07/18/2017   AST 32 07/18/2017   NA 138 07/20/2017   K 3.9 07/20/2017   CL 102 07/20/2017   CREATININE 0.80 07/20/2017   BUN 14 07/20/2017   CO2 25 07/20/2017   TSH 3.340 11/27/2016   INR 1.10 07/18/2017   HGBA1C 5.9  (H) 07/20/2017   Starting to wake up Getting xray   Delight OvensEdward B Jemeka Wagler MD  Beeper [phone] Office [phone] 07/20/2017 8:42 PM

## 2017-07-20 NOTE — Anesthesia Postprocedure Evaluation (Signed)
Anesthesia Post Note  Patient: Thomas MccallumLarry G Ashline  Procedure(s) Performed: CORONARY ARTERY BYPASS GRAFTING (CABG) times four using left internal mammary artery to LAD and endoscopically harvested right saphenous vein graft to distal circumflex, intermediate, and PD (N/A Chest)     Patient location during evaluation: SICU Anesthesia Type: General Level of consciousness: patient remains intubated per anesthesia plan Pain management: pain level controlled Vital Signs Assessment: post-procedure vital signs reviewed and stable Respiratory status: spontaneous breathing Cardiovascular status: stable Postop Assessment: no apparent nausea or vomiting Anesthetic complications: no    Last Vitals:  Vitals:   07/20/17 0712 07/20/17 1215  BP: 124/70 117/77  Pulse: 69   Resp: 19 18  Temp: 37.2 C 37.2 C  SpO2: 92% 91%    Last Pain:  Vitals:   07/20/17 1215  TempSrc: Oral  PainSc: 0-No pain                 Kenedi Cilia

## 2017-07-20 NOTE — Transfer of Care (Signed)
Immediate Anesthesia Transfer of Care Note  Patient: Thomas Miller  Procedure(s) Performed: CORONARY ARTERY BYPASS GRAFTING (CABG) times four using left internal mammary artery to LAD and endoscopically harvested right saphenous vein graft to distal circumflex, intermediate, and PD (N/A Chest)  Patient Location: SICU  Anesthesia Type:General  Level of Consciousness: sedated and Patient remains intubated per anesthesia plan  Airway & Oxygen Therapy: Patient remains intubated per anesthesia plan and Patient placed on Ventilator (see vital sign flow sheet for setting)  Post-op Assessment: Report given to RN and Post -op Vital signs reviewed and stable  Post vital signs: Reviewed and stable  Last Vitals:  Vitals Value Taken Time  BP 114/79 07/20/2017  8:30 PM  Temp 36 C 07/20/2017  8:30 PM  Pulse 90 07/20/2017  8:30 PM  Resp 12 07/20/2017  8:30 PM  SpO2 91 % 07/20/2017  8:30 PM  Vitals shown include unvalidated device data.  Last Pain:  Vitals:   07/20/17 1215  TempSrc: Oral  PainSc: 0-No pain      Patients Stated Pain Goal: 1 (07/18/17 0800)  Complications: No apparent anesthesia complications

## 2017-07-20 NOTE — Consult Note (Addendum)
[facility] CM Primary Care Navigator  07/20/2017  Thomas Miller 1950/09/25 [id number]   Went to see patient at the bedside to identify possible discharge needs but staff reports that patient is off the unit in the OR at this time. (Coronary Artery Bypass Grafting x 4)    Will attempt to see patient at another time when available in the room.    Addendum:  Attempt to see patient at the bedside to identify possible discharge needs but he was transferred [facility] 06to 2H02(cardiac ICU) after surgery.  Will attempt to see patient at another time when out of ICU.     Addendum (07/24/17):  Met withpatient at the bedside toidentify possible discharge needs. Patientreportshaving "chest heaviness/ chest pains and couldn't breath during the bus trip in [place]" which had ledto thisadmission/ surgery. (coronary artery bypass grafting x 4)  Patientendorses Dr.Mark Crissman with [facility]) ashisprimary care provider.   [facility] [place] [facility] without difficulty.  Patientreports thathehas beenmanaginghisownmedications at [employer] use of "pill box" system filled weekly.  Patient verbalizedthat he was driving prior to admission/ surgery,butson Thomas Miller) or wife Thomas Miller) willprovide transportation to his doctors' appointmentsafter discharge.  Patientstates that hiswifewill be hisprimary caregiver athome.  Anticipated plan for dischargeis home per patient.  Patientvoiced understandingto callprimarycare provider'soffice whenhereturnshome,for a post discharge follow-upvisitwithin1- 2 weeksor sooner if needs arise.Patient letter (with PCP's contact number) was provided asareminder.   Discussed with patientregarding [facility] CM services available for health managementandresourcesat homebuthe deniesany needs or issues at this  point.Patientverbalizedunderstandingof needto seekreferral from primary care provider to [facility] care management ifdeemed necessary and appropriatefor anyservicesin thefuture.  [facility] care management information was provided for futureneedsthat hemay have.  Patient however,verbally agreedand optedforEMMIcalls tofollow-up withhisrecoveryat home.   Referral made for [facility] General calls after discharge.    For additional questions please contact:  Edwena Felty A. Milbert Bixler, BSN, RN-BC [facility] PRIMARY CARE Navigator Cell: [phone]

## 2017-07-20 NOTE — Brief Op Note (Signed)
      [address]       [place] [id number]             [phone]      07/20/2017  8:41 PM  PATIENT:  Thomas MccallumLarry G Miller  67 y.o. male  PRE-OPERATIVE DIAGNOSIS:  unstable angina  POST-OPERATIVE DIAGNOSIS:  unstable angina  PROCEDURE:  Procedure(s): CORONARY ARTERY BYPASS GRAFTING (CABG) times four using left internal mammary artery to LAD and endoscopically harvested right saphenous vein graft to distal circumflex, intermediate, and PD (N/A) X4 SURGEON:  Surgeon(s) and Role:    * Delight OvensGerhardt, Tish Begin B, MD - Primary  PHYSICIAN ASSISTANT: WAYNE GOLD PA-C  ANESTHESIA:   general  EBL:  700 mL   BLOOD ADMINISTERED:none  DRAINS: PLEURAL AND PERICARDIAL CHEST DRAINS   LOCAL MEDICATIONS USED:  NONE  SPECIMEN:  No Specimen  DISPOSITION OF SPECIMEN:  N/A  COUNTS:  YES   DICTATION: .Dragon Dictation  PLAN OF CARE: Admit to inpatient   PATIENT DISPOSITION:  ICU - intubated and hemodynamically stable.   Delay start of Pharmacological VTE agent (>24hrs) due to surgical blood loss or risk of bleeding: yes

## 2017-07-21 ENCOUNTER — Inpatient Hospital Stay (HOSPITAL_COMMUNITY): Payer: Medicare Other

## 2017-07-21 ENCOUNTER — Encounter (HOSPITAL_COMMUNITY): Payer: Self-pay | Admitting: Cardiothoracic Surgery

## 2017-07-21 LAB — GLUCOSE, CAPILLARY
Glucose-Capillary: 132 mg/dL — ABNORMAL HIGH (ref 70–99)
Glucose-Capillary: 129 mg/dL — ABNORMAL HIGH (ref 70–99)
Glucose-Capillary: 133 mg/dL — ABNORMAL HIGH (ref 70–99)
Glucose-Capillary: 157 mg/dL — ABNORMAL HIGH (ref 70–99)
Glucose-Capillary: 128 mg/dL — ABNORMAL HIGH (ref 70–99)
Glucose-Capillary: 124 mg/dL — ABNORMAL HIGH (ref 70–99)
Glucose-Capillary: 120 mg/dL — ABNORMAL HIGH (ref 70–99)
Glucose-Capillary: 126 mg/dL — ABNORMAL HIGH (ref 70–99)
Glucose-Capillary: 136 mg/dL — ABNORMAL HIGH (ref 70–99)
Glucose-Capillary: 132 mg/dL — ABNORMAL HIGH (ref 70–99)
Glucose-Capillary: 111 mg/dL — ABNORMAL HIGH (ref 70–99)
Glucose-Capillary: 146 mg/dL — ABNORMAL HIGH (ref 70–99)
Glucose-Capillary: 124 mg/dL — ABNORMAL HIGH (ref 70–99)
Glucose-Capillary: 130 mg/dL — ABNORMAL HIGH (ref 70–99)
Glucose-Capillary: 121 mg/dL — ABNORMAL HIGH (ref 70–99)

## 2017-07-21 LAB — BASIC METABOLIC PANEL
Anion gap: 6 (ref 5–15)
BUN: 17 mg/dL (ref 8–23)
CO2: 22 mmol/L (ref 22–32)
Calcium: 7.9 mg/dL — ABNORMAL LOW (ref 8.9–10.3)
Chloride: 109 mmol/L (ref 98–111)
Creatinine, Ser: 0.96 mg/dL (ref 0.61–1.24)
GFR calc Af Amer: >60 mL/min (ref >60)
GFR calc non Af Amer: >60 mL/min (ref >60)
Glucose, Bld: 158 mg/dL — ABNORMAL HIGH (ref 70–99)
Potassium: 4.4 mmol/L (ref 3.5–5.1)
Sodium: 137 mmol/L (ref 135–145)

## 2017-07-21 LAB — CBC
HCT: 38.2 % — ABNORMAL LOW (ref 39.0–52.0)
HCT: 40.9 % (ref 39.0–52.0)
Hemoglobin: 12.5 g/dL — ABNORMAL LOW (ref 13.0–17.0)
Hemoglobin: 12.9 g/dL — ABNORMAL LOW (ref 13.0–17.0)
MCH: 29.3 pg (ref 26.0–34.0)
MCH: 29.1 pg (ref 26.0–34.0)
MCHC: 32.7 g/dL (ref 30.0–36.0)
MCHC: 31.5 g/dL (ref 30.0–36.0)
MCV: 89.5 fL (ref 78.0–100.0)
MCV: 92.1 fL (ref 78.0–100.0)
Platelets: 126 10*3/uL — ABNORMAL LOW (ref 150–400)
Platelets: 145 10*3/uL — ABNORMAL LOW (ref 150–400)
RBC: 4.27 MIL/uL (ref 4.22–5.81)
RBC: 4.44 MIL/uL (ref 4.22–5.81)
RDW: 13.5 % (ref 11.5–15.5)
RDW: 13.9 % (ref 11.5–15.5)
WBC: 14.8 10*3/uL — ABNORMAL HIGH (ref 4.0–10.5)
WBC: 18.5 10*3/uL — ABNORMAL HIGH (ref 4.0–10.5)

## 2017-07-21 LAB — POCT I-STAT, CHEM 8
BUN: 16 mg/dL (ref 8–23)
Calcium, Ion: 1.21 mmol/L (ref 1.15–1.40)
Chloride: 101 mmol/L (ref 98–111)
Creatinine, Ser: 0.9 mg/dL (ref 0.61–1.24)
Glucose, Bld: 147 mg/dL — ABNORMAL HIGH (ref 70–99)
HCT: 40 % (ref 39.0–52.0)
Hemoglobin: 13.6 g/dL (ref 13.0–17.0)
Potassium: 3.9 mmol/L (ref 3.5–5.1)
Sodium: 137 mmol/L (ref 135–145)
TCO2: 21 mmol/L — ABNORMAL LOW (ref 22–32)

## 2017-07-21 LAB — MAGNESIUM
Magnesium: 3 mg/dL — ABNORMAL HIGH (ref 1.7–2.4)
Magnesium: 2.5 mg/dL — ABNORMAL HIGH (ref 1.7–2.4)

## 2017-07-21 LAB — POCT I-STAT 3, ART BLOOD GAS (G3+)
Acid-base deficit: 5 mmol/L — ABNORMAL HIGH (ref 0.0–2.0)
Acid-base deficit: 5 mmol/L — ABNORMAL HIGH (ref 0.0–2.0)
Bicarbonate: 20.4 mmol/L (ref 20.0–28.0)
Bicarbonate: 20 mmol/L (ref 20.0–28.0)
O2 Saturation: 93 %
O2 Saturation: 95 %
Patient temperature: 37
Patient temperature: 37.1
TCO2: 21 mmol/L — ABNORMAL LOW (ref 22–32)
TCO2: 21 mmol/L — ABNORMAL LOW (ref 22–32)
pCO2 arterial: 36.3 mmHg (ref 32.0–48.0)
pCO2 arterial: 34.4 mmHg (ref 32.0–48.0)
pH, Arterial: 7.357 (ref 7.350–7.450)
pH, Arterial: 7.374 (ref 7.350–7.450)
pO2, Arterial: 70 mmHg — ABNORMAL LOW (ref 83.0–108.0)
pO2, Arterial: 79 mmHg — ABNORMAL LOW (ref 83.0–108.0)

## 2017-07-21 LAB — CREATININE, SERUM
Creatinine, Ser: 1.02 mg/dL (ref 0.61–1.24)
GFR calc Af Amer: >60 mL/min (ref >60)
GFR calc non Af Amer: >60 mL/min (ref >60)

## 2017-07-21 LAB — POCT I-STAT 4, (NA,K, GLUC, HGB,HCT)
Glucose, Bld: 126 mg/dL — ABNORMAL HIGH (ref 70–99)
HCT: 33 % — ABNORMAL LOW (ref 39.0–52.0)
Hemoglobin: 11.2 g/dL — ABNORMAL LOW (ref 13.0–17.0)
Potassium: 3.7 mmol/L (ref 3.5–5.1)
Sodium: 138 mmol/L (ref 135–145)

## 2017-07-21 MED ORDER — ORAL CARE MOUTH RINSE
15.0000 mL | Freq: Two times a day (BID) | OROMUCOSAL | Status: DC
  Administered 2017-07-21 (×2): 15 mL via OROMUCOSAL

## 2017-07-21 MED ORDER — ENOXAPARIN SODIUM 30 MG/0.3ML ~~LOC~~ SOLN
30.0000 mg | Freq: Every day | SUBCUTANEOUS | Status: DC
  Administered 2017-07-21 – 2017-07-26 (×6): 30 mg via SUBCUTANEOUS
  Filled 2017-07-21 (×6): qty 0.3

## 2017-07-21 MED ORDER — SODIUM CHLORIDE 0.9% FLUSH: 10.0000 mL | INTRAVENOUS | Status: DC | PRN

## 2017-07-21 MED ORDER — LISINOPRIL 5 MG PO TABS
5.0000 mg | ORAL_TABLET | Freq: Every day | ORAL | Status: DC
  Administered 2017-07-21 – 2017-07-27 (×7): 5 mg via ORAL
  Filled 2017-07-21 (×7): qty 1

## 2017-07-21 MED ORDER — CHLORHEXIDINE GLUCONATE CLOTH 2 % EX PADS
6.0000 | MEDICATED_PAD | Freq: Every day | CUTANEOUS | Status: DC
  Administered 2017-07-23: 6 via TOPICAL

## 2017-07-21 MED ORDER — SODIUM CHLORIDE 0.9% FLUSH
10.0000 mL | Freq: Two times a day (BID) | INTRAVENOUS | Status: DC
  Administered 2017-07-21 – 2017-07-23 (×3): 10 mL

## 2017-07-21 MED ORDER — INSULIN ASPART 100 UNIT/ML ~~LOC~~ SOLN
0.0000 [IU] | SUBCUTANEOUS | Status: DC
  Administered 2017-07-21 – 2017-07-22 (×4): 2 [IU] via SUBCUTANEOUS

## 2017-07-21 MED FILL — Magnesium Sulfate Inj 50%: INTRAMUSCULAR | Qty: 10 | Status: AC

## 2017-07-21 MED FILL — Potassium Chloride Inj 2 mEq/ML: INTRAVENOUS | Qty: 40 | Status: AC

## 2017-07-21 MED FILL — Electrolyte-R (PH 7.4) Solution: INTRAVENOUS | Qty: 3000 | Status: AC

## 2017-07-21 MED FILL — Mannitol IV Soln 20%: INTRAVENOUS | Qty: 500 | Status: AC

## 2017-07-21 MED FILL — Lidocaine HCl Local Soln Prefilled Syringe 100 MG/5ML (2%): INTRAMUSCULAR | Qty: 5 | Status: AC

## 2017-07-21 MED FILL — Heparin Sodium (Porcine) Inj 1000 Unit/ML: INTRAMUSCULAR | Qty: 20 | Status: AC

## 2017-07-21 MED FILL — Heparin Sodium (Porcine) Inj 1000 Unit/ML: INTRAMUSCULAR | Qty: 30 | Status: AC

## 2017-07-21 MED FILL — Sodium Chloride IV Soln 0.9%: INTRAVENOUS | Qty: 2000 | Status: AC

## 2017-07-21 NOTE — Care Management Note (Signed)
Case Management Note Donn PieriniKristi Taija Mathias RN,BSN [facility] Case Manager  [phone]  Patient Details  Name: Michae KavaLarry G Bonfield MRN: [id number] Date of Birth: 01/15/1951  Subjective/Objective:  Pt admitted with [place], MVD found- now s/p CABGx4 on 07/20/17                   Action/Plan: PTA pt lived at home with spouse- anticipate return home- CM to follow for transition of care needs  Expected Discharge Date:                  Expected Discharge Plan:  Home/Self Care  In-House Referral:     Discharge planning Services  CM Consult  Post Acute Care Choice:    Choice offered to:     DME Arranged:    DME Agency:     HH Arranged:    HH Agency:     Status of Service:  In process, will continue to follow  If discussed at Long Length of Stay Meetings, dates discussed:    Discharge Disposition:   Additional Comments:  Darrold SpanWebster, Elan Brainerd Hall, RN 07/21/2017, 10:33 AM

## 2017-07-21 NOTE — Progress Notes (Signed)
Patient ID: Thomas Miller, male   DOB: [birth year], 67 y.o.   MRN: [id number] TCTS DAILY ICU PROGRESS NOTE                   [address]            [place] [address]          [phone]   1 Day Post-Op Procedure(s) (LRB): CORONARY ARTERY BYPASS GRAFTING (CABG) times four using left internal mammary artery to LAD and endoscopically harvested right saphenous vein graft to distal circumflex, intermediate, and PD (N/A)  Total Length of Stay:  LOS: 4 days   Subjective: Patient awake alert neurologically intact extubated last night without difficulty. Objective: Vital signs in last 24 hours: Temp:  [96.4 F (35.8 C)-99.7 F (37.6 C)] 99.7 F (37.6 C) (06/25 0700) Pulse Rate:  [70-83] 80 (06/25 0700) Cardiac Rhythm: Atrial paced (06/25 0000) Resp:  [12-25] 22 (06/25 0700) BP: (80-121)/(62-86) 105/81 (06/25 0600) SpO2:  [91 %-97 %] 97 % (06/25 0700) Arterial Line BP: (88-123)/(47-73) 113/58 (06/25 0700) FiO2 (%):  [40 %-50 %] 40 % (06/25 0135) Weight:  [218 lb 14.7 oz (99.3 kg)] 218 lb 14.7 oz (99.3 kg) (06/25 0300)  Filed Weights   07/19/17 0620 07/20/17 0334 07/21/17 0300  Weight: 219 lb (99.3 kg) 209 lb 4.8 oz (94.9 kg) 218 lb 14.7 oz (99.3 kg)    Weight change: 9 lb 9.9 oz (4.362 kg)   Hemodynamic parameters for last 24 hours: PAP: (14-28)/(8-12) 28/12 CO:  [4.2 L/min] 4.2 L/min  Intake/Output from previous day: 06/24 0701 - 06/25 0700 In: 5997.4 [I.V.:2993.1; Blood:550; IV Piggyback:2454.2] Out: 2245 [Urine:1405; Blood:700; Chest Tube:140]  Intake/Output this shift: No intake/output data recorded.  Current Meds: Scheduled Meds: . acetaminophen  1,000 mg Oral Q6H   Or  . acetaminophen (TYLENOL) oral liquid 160 mg/5 mL  1,000 mg Per Tube Q6H  . aspirin EC  325 mg Oral Daily   Or  . aspirin  324 mg Per Tube Daily  . atorvastatin  80 mg Oral q1800  . bisacodyl  10 mg Oral Daily   Or  . bisacodyl  10 mg Rectal Daily  . chlorhexidine gluconate  (MEDLINE KIT)  15 mL Mouth Rinse BID  . docusate sodium  200 mg Oral Daily  . insulin regular  0-10 Units Intravenous TID WC  . mouth rinse  15 mL Mouth Rinse BID  . metoprolol tartrate  12.5 mg Oral BID   Or  . metoprolol tartrate  12.5 mg Per Tube BID  . [START ON 07/22/2017] pantoprazole  40 mg Oral Daily  . sodium chloride flush  3 mL Intravenous Q12H   Continuous Infusions: . sodium chloride 10 mL/hr at 07/21/17 0700  . sodium chloride 250 mL (07/21/17 0623)  . sodium chloride 1 mL/hr at 07/21/17 0600  . albumin human 250 mL/hr at 07/21/17 0600  . cefUROXime (ZINACEF)  IV Stopped (07/20/17 2202)  . dexmedetomidine Stopped (07/21/17 0113)  . famotidine (PEPCID) IV Stopped (07/20/17 2102)  . insulin (NOVOLIN-R) infusion Stopped (07/20/17 2010)  . lactated ringers    . lactated ringers    . lactated ringers 20 mL/hr at 07/21/17 0700  . nitroGLYCERIN 10 mcg/min (07/21/17 0700)  . phenylephrine (NEO-SYNEPHRINE) Adult infusion Stopped (07/20/17 2015)   PRN Meds:.sodium chloride, albumin human, lactated ringers, metoprolol tartrate, midazolam, morphine injection, morphine injection, ondansetron (ZOFRAN) IV, oxyCODONE, sodium chloride flush, traMADol  General appearance: alert and cooperative Neurologic: intact  Heart: regular rate and rhythm, S1, S2 normal, no murmur, click, rub or gallop Lungs: diminished breath sounds bibasilar Abdomen: soft, non-tender; bowel sounds normal; no masses,  no organomegaly Extremities: extremities normal, atraumatic, no cyanosis or edema and Homans sign is negative, no sign of DVT Wound: Sternum stable  Lab Results: CBC: Recent Labs    07/20/17 0425  07/20/17 1746  07/20/17 1906 07/21/17 0339  WBC 13.0*  --   --   --   --  14.8*  HGB 14.3   < > 11.1*   < > 11.2* 12.5*  HCT 43.6   < > 34.2*   < > 33.0* 38.2*  PLT 175  --  143*  --   --  126*   < > = values in this interval not displayed.   BMET:  Recent Labs    07/20/17 0425   07/20/17 1906 07/21/17 0339  NA 139   < > 138 137  K 4.4   < > 3.9 4.4  CL 104   < > 102 109  CO2 25  --   --  22  GLUCOSE 112*   < > 133* 158*  BUN 19   < > 14 17  CREATININE 1.08   < > 0.80 0.96  CALCIUM 9.3  --   --  7.9*   < > = values in this interval not displayed.    CMET: Lab Results  Component Value Date   WBC 14.8 (H) 07/21/2017   HGB 12.5 (L) 07/21/2017   HCT 38.2 (L) 07/21/2017   PLT 126 (L) 07/21/2017   GLUCOSE 158 (H) 07/21/2017   CHOL 146 07/18/2017   TRIG 51 07/18/2017   HDL 48 07/18/2017   LDLCALC 88 07/18/2017   ALT 33 07/18/2017   AST 32 07/18/2017   NA 137 07/21/2017   K 4.4 07/21/2017   CL 109 07/21/2017   CREATININE 0.96 07/21/2017   BUN 17 07/21/2017   CO2 22 07/21/2017   TSH 3.340 11/27/2016   INR 1.10 07/18/2017   HGBA1C 5.9 (H) 07/20/2017      PT/INR: No results for input(s): LABPROT, INR in the last 72 hours. Radiology: Dg Chest Port 1 View  Result Date: 07/20/2017 CLINICAL DATA:  Status post CABG x4. EXAM: PORTABLE CHEST 1 VIEW COMPARISON:  Chest x-ray dated 07/18/2017. FINDINGS: Endotracheal tube tip is above the level of the clavicles, approximately 6 cm above the level of the carina. Enteric tube passes below the diaphragm. Gordy Councilman catheter appears appropriately positioned with tip in the midline. LEFT-sided chest tube in place with tip directed towards the lung apex. Mild central pulmonary vascular congestion without overt alveolar pulmonary edema. No pleural effusion or pneumothorax seen. IMPRESSION: 1. Endotracheal tube tip is above the level of the clavicles and approximately 6 cm above the level of the carina. 2. Remainder of the support apparatus detailed above. 3. Mild central pulmonary vascular congestion. No pleural effusion or pneumothorax seen. Electronically Signed   By: Franki Cabot M.D.   On: 07/20/2017 20:58     Assessment/Plan: S/P Procedure(s) (LRB): CORONARY ARTERY BYPASS GRAFTING (CABG) times four using left  internal mammary artery to LAD and endoscopically harvested right saphenous vein graft to distal circumflex, intermediate, and PD (N/A) Mobilize Diuresis Diabetes control d/c tubes/lines See progression orders Explant expected blood loss anemia    Grace Isaac 07/21/2017 7:29 AM

## 2017-07-21 NOTE — Progress Notes (Signed)
TCTS BRIEF SICU PROGRESS NOTE  1 Day Post-Op  S/P Procedure(s) (LRB): CORONARY ARTERY BYPASS GRAFTING (CABG) times four using left internal mammary artery to LAD and endoscopically harvested right saphenous vein graft to distal circumflex, intermediate, and PD (N/A)   Stable day NSR w/ stable BP Breathing comfortably on RA UOP adequate Labs okay  Plan: Continue current plan  Thomas Nailslarence H Lelynd Poer, MD 07/21/2017 7:43 PM

## 2017-07-21 NOTE — Op Note (Signed)
NAME: Thomas Miller, Thomas G. MEDICAL RECORD ZO:[id number] ACCOUNT [id number] DATE OF BIRTH:[birth year] FACILITY: [facility] LOCATION: [facility] PHYSICIAN:Dominique Ressel Bari EdwardB. Nylia Gavina, MD  OPERATIVE REPORT DATE OF PROCEDURE:  07/21/2017 PREOPERATIVE DIAGNOSIS:  Unstable angina with 3 vessel coronary artery disease. POSTOPERATIVE DIAGNOSIS: Unstable angina with 3 vessel coronary artery disease. SURGICAL PROCEDURE:  Coronary artery bypass grafting x4 with the left internal mammary to the left anterior descending coronary artery, reverse saphenous vein graft to the intermediate coronary artery, reverse saphenous vein graft to the distal  circumflex, reverse saphenous vein graft to the mid posterior descending with right greater saphenous endoscopic vein harvesting, thigh and upper calf.  SURGEON:   Sheliah PlaneEdward Dineen Conradt, MD  FIRST ASSISTANT:    Gershon CraneWayne Gold, [place]  BRIEF HISTORY:  The patient is a 67 year old male, who, in May of this year while traveling in [place], began having significant shortness of breath with minimal exertion with associated chest discomfort that would radiate to his left jaw and the left side  of his face.  He had repeated episodes of this that were progressively becoming worse and ultimately was seen by Dr.  Tomie Chinaevankar, who arranged for cardiac catheterization on 07/17/2017.  At the time of catheterization by Dr.  END, the patient had a  significant 3-vessel coronary artery disease including diffuse small vessel disease in the right coronary artery with 80% stenosis of the proximal LAD, 70% intermediate.  Patient had circumflex disease and not recognized on the cath films, but found at  the time of surgery, had a very large distal circumflex branch that was totally occluded.  Overall, ventricular function was preserved.  Because of the patient's significant 3-vessel coronary artery disease and unstable anginal symptoms, coronary artery  bypass grafting was recommended to the patient who agreed and signed informed  consent.  DESCRIPTION OF PROCEDURE:  With Swan-Ganz and arterial line monitors in place, the patient underwent general endotracheal anesthesia without incident.  Skin of the chest and legs was prepped with Betadine and draped in the usual sterile manner.   Appropriate timeout was performed and we proceeded with right greater saphenous vein endoscopic harvesting.  The vein was of adequate quality and caliber.  A median sternotomy was performed.  Left internal mammary artery was dissected down as a pedicle  graft.  The distal artery was divided, had good free flow.  The pericardium was opened.  Overall, ventricular function appeared preserved.  The patient was systemically heparinized.  The ascending aorta was cannulated.  The right atrium was cannulated.   Aortic root vent cardioplegia needle was introduced into the ascending aorta.  The patient was placed on cardiopulmonary bypass at 2.4 L/min/m2.    The sites of anastomosis were selected and dissected out of the epicardium.  In the mid posterior  descending, there was a soft area that could be opened.  As we elevated the heart and examined the lateral side, a small first obtuse marginal was noted and a very small, very distal circumflex branch was noted there was a large dominant vessel on the  lateral wall of the heart which was not evident on the cardiac catheterization films and was a totally occluded distal circumflex.  The intermediate coronary artery was 1.5 mm vessel.  The LAD was a 1.2-1.4 mm in size with some calcifications distally.   The patient's body temperature was cooled to 32 degrees.  The aortic crossclamp was applied and 500 mL of cold blood potassium cardioplegia was administered with diastolic arrest of the heart.  Myocardial septal temperature was  monitored throughout the  crossclamp.  We turned our attention first to the mid posterior descending.  This vessel was diffusely diseased, but was opened in the mid PD and admitted a 1 mm  probe distally.  Using a running 7-0 Prolene, a distal anastomosis was performed with a  segment of reverse saphenous vein graft.  Additional cold blood cardioplegia was intermittently placed down the vein graft.  The heart was elevated and the large distal circumflex, which easily admitted a 1.5 mm probe was opened using a running 7-0  Prolene.  A distal anastomosis was performed with a segment of reverse saphenous vein graft.  The intermediate vessel was opened and was 1.3-1.4 mm in size.  Using a running 7-0 Prolene a segment of reverse saphenous vein graft was anastomosed to the  intermediate.  The distal LAD was then opened, admitted a 1.5 mm proximally and distally using a running 8-0 Prolene.  The left internal mammary artery was anastomosed to the left anterior descending coronary artery.  With release of the bulldog on the  mammary artery.  There was prompt rise in myocardial septal temperature.  The bulldog was placed back on the mammary artery with cross clamp still in place.  Three punch aortotomies were performed and each of the 3 vein grafts were anastomosed to the  ascending aorta.  The heart was allowed to passively fill and deair.  The bulldog was removed from the mammary artery with proper rise in myocardial septal temperature.  Aortic cross clamp was removed with a total crossclamp time of 97 minutes.  The  patient spontaneously converted to a sinus rhythm.  Sites of anastomosis were inspected and were free of bleeding.  The patient's body temperature was rewarmed to 37 degrees.  Atrial and ventricular pacing wires were applied.  He was actually paced,  increased rate.  The patient with his body temperature rewarmed to 37 degrees was then ventilated and weaned from cardiopulmonary bypass without difficulty.  He remained hemodynamically stable.  He was decannulated in the usual fashion.  Protamine  sulfate was administered.  With the operative field hemostatic, graft markers were applied.   A left pleural tube and Blake mediastinal drain were left in place.  The pericardium was loosely reapproximated.  The sternum was closed with #6 stainless steel  wire.  Fascia closed with interrupted 0 Vicryl, running 3-0 Vicryl, subcutaneous tissue with 3-0 subcuticular stitch and skin edges.  Dressings were applied.  Sponge and needle count was reported as correct at completion of the procedure.    The patient tolerated the procedure without obvious complication.  He did not require any blood bank blood products during the procedure.    Total pump time was 131 minutes.    Gershon Crane, [place] assisted in harvesting vein and exposure and constructing anastomosis, cannulation.  AN/NUANCE  D:07/21/2017 T:07/21/2017 JOB:[id number]

## 2017-07-21 NOTE — Procedures (Signed)
Extubation Procedure Note  Patient Details:   Name: Thomas Miller DOB: 1950/04/13 MRN: [id number]   Airway Documentation:    Vent end date: 07/21/17 Vent end time: 0158   Evaluation  O2 sats: stable throughout Complications: No apparent complications Patient did tolerate procedure well. Bilateral Breath Sounds: Clear, Diminished   Yes, pt able to cough to clear secretions. Positive for cuff leak before extubation. Pt had NIF of -25 and VC of 1200. Pt placed on 5L nasal cannula and tolerating well at this time.   Tacy Learnurriff, Ashawnti Tangen E 07/21/2017, 2:05 AM

## 2017-07-22 ENCOUNTER — Inpatient Hospital Stay (HOSPITAL_COMMUNITY): Payer: Medicare Other

## 2017-07-22 LAB — GLUCOSE, CAPILLARY
Glucose-Capillary: 105 mg/dL — ABNORMAL HIGH (ref 70–99)
Glucose-Capillary: 119 mg/dL — ABNORMAL HIGH (ref 70–99)
Glucose-Capillary: 129 mg/dL — ABNORMAL HIGH (ref 70–99)
Glucose-Capillary: 94 mg/dL (ref 70–99)
Glucose-Capillary: 95 mg/dL (ref 70–99)

## 2017-07-22 LAB — BASIC METABOLIC PANEL
Anion gap: 7 (ref 5–15)
BUN: 14 mg/dL (ref 8–23)
CO2: 25 mmol/L (ref 22–32)
Calcium: 8.4 mg/dL — ABNORMAL LOW (ref 8.9–10.3)
Chloride: 103 mmol/L (ref 98–111)
Creatinine, Ser: 0.97 mg/dL (ref 0.61–1.24)
GFR calc Af Amer: >60 mL/min (ref >60)
GFR calc non Af Amer: >60 mL/min (ref >60)
Glucose, Bld: 111 mg/dL — ABNORMAL HIGH (ref 70–99)
Potassium: 4.1 mmol/L (ref 3.5–5.1)
Sodium: 135 mmol/L (ref 135–145)

## 2017-07-22 LAB — CBC
HCT: 38.2 % — ABNORMAL LOW (ref 39.0–52.0)
Hemoglobin: 12 g/dL — ABNORMAL LOW (ref 13.0–17.0)
MCH: 28.7 pg (ref 26.0–34.0)
MCHC: 31.4 g/dL (ref 30.0–36.0)
MCV: 91.4 fL (ref 78.0–100.0)
Platelets: 149 10*3/uL — ABNORMAL LOW (ref 150–400)
RBC: 4.18 MIL/uL — ABNORMAL LOW (ref 4.22–5.81)
RDW: 14.1 % (ref 11.5–15.5)
WBC: 17.2 10*3/uL — ABNORMAL HIGH (ref 4.0–10.5)

## 2017-07-22 MED ORDER — AMIODARONE LOAD VIA INFUSION
150.0000 mg | Freq: Once | INTRAVENOUS | Status: AC
  Administered 2017-07-22: 150 mg via INTRAVENOUS
  Filled 2017-07-22: qty 83.34

## 2017-07-22 MED ORDER — METOPROLOL TARTRATE 25 MG/10 ML ORAL SUSPENSION
25.0000 mg | Freq: Two times a day (BID) | ORAL | Status: DC
  Filled 2017-07-22 (×2): qty 10

## 2017-07-22 MED ORDER — AMIODARONE HCL IN DEXTROSE 360-4.14 MG/200ML-% IV SOLN
30.0000 mg/h | INTRAVENOUS | Status: DC
  Administered 2017-07-22 – 2017-07-23 (×2): 30 mg/h via INTRAVENOUS
  Filled 2017-07-22 (×2): qty 200

## 2017-07-22 MED ORDER — AMIODARONE HCL IN DEXTROSE 360-4.14 MG/200ML-% IV SOLN
60.0000 mg/h | INTRAVENOUS | Status: AC
  Administered 2017-07-22: 60 mg/h via INTRAVENOUS
  Filled 2017-07-22: qty 400

## 2017-07-22 MED ORDER — METOPROLOL TARTRATE 25 MG PO TABS
25.0000 mg | ORAL_TABLET | Freq: Two times a day (BID) | ORAL | Status: DC
  Administered 2017-07-22 – 2017-07-24 (×6): 25 mg via ORAL
  Filled 2017-07-22 (×7): qty 1

## 2017-07-22 MED ORDER — FUROSEMIDE 10 MG/ML IJ SOLN
40.0000 mg | Freq: Once | INTRAMUSCULAR | Status: AC
Start: 2017-07-22 — End: 2017-07-22
  Administered 2017-07-22: 40 mg via INTRAVENOUS
  Filled 2017-07-22: qty 4

## 2017-07-22 NOTE — Progress Notes (Signed)
CT Surgery  Stable day, no complaints Patient maintained normal sinus rhythm stable vital signs

## 2017-07-22 NOTE — Progress Notes (Signed)
Patient ID: Thomas Miller, male   DOB: December 02, 1950, 67 y.o.   MRN: [id number] TCTS DAILY ICU PROGRESS NOTE                   [address]            [place] [address]          [phone]   2 Days Post-Op Procedure(s) (LRB): CORONARY ARTERY BYPASS GRAFTING (CABG) times four using left internal mammary artery to LAD and endoscopically harvested right saphenous vein graft to distal circumflex, intermediate, and PD (N/A)  Total Length of Stay:  LOS: 5 days   Subjective: Patient awake alert neurologically intact developed atrial fib during the night, rate now about 100 still atrial fib on IV amnio  Objective: Vital signs in last 24 hours: Temp:  [97.6 F (36.4 C)-99.7 F (37.6 C)] 98.2 F (36.8 C) (06/26 0700) Pulse Rate:  [50-120] 100 (06/26 0600) Cardiac Rhythm: Atrial fibrillation;Ventricular paced (06/26 0348) Resp:  [12-29] 19 (06/26 0600) BP: (99-147)/(70-120) 131/89 (06/26 0600) SpO2:  [90 %-97 %] 91 % (06/26 0600) Arterial Line BP: (119-146)/(62-75) 146/72 (06/25 1000)  Filed Weights   07/19/17 0620 07/20/17 0334 07/21/17 0300  Weight: 219 lb (99.3 kg) 209 lb 4.8 oz (94.9 kg) 218 lb 14.7 oz (99.3 kg)    Weight change:    Hemodynamic parameters for last 24 hours: PAP: (26-36)/(9-18) 36/17  Intake/Output from previous day: 06/25 0701 - 06/26 0700 In: 1031.2 [P.O.:720; I.V.:111.2; IV Piggyback:200] Out: 335 [Urine:325; Chest Tube:10]  Intake/Output this shift: No intake/output data recorded.  Current Meds: Scheduled Meds: . acetaminophen  1,000 mg Oral Q6H   Or  . acetaminophen (TYLENOL) oral liquid 160 mg/5 mL  1,000 mg Per Tube Q6H  . aspirin EC  325 mg Oral Daily   Or  . aspirin  324 mg Per Tube Daily  . atorvastatin  80 mg Oral q1800  . bisacodyl  10 mg Oral Daily   Or  . bisacodyl  10 mg Rectal Daily  . Chlorhexidine Gluconate Cloth  6 each Topical Q0600  . docusate sodium  200 mg Oral Daily  . enoxaparin (LOVENOX) injection  30 mg  Subcutaneous QHS  . insulin aspart  0-24 Units Subcutaneous Q4H  . lisinopril  5 mg Oral Daily  . metoprolol tartrate  12.5 mg Oral BID   Or  . metoprolol tartrate  12.5 mg Per Tube BID  . pantoprazole  40 mg Oral Daily  . sodium chloride flush  10-40 mL Intracatheter Q12H  . sodium chloride flush  3 mL Intravenous Q12H   Continuous Infusions: . sodium chloride 250 mL (07/21/17 0623)  . amiodarone 60 mg/hr (07/22/17 0600)   Followed by  . amiodarone    . cefUROXime (ZINACEF)  IV Stopped (07/21/17 2156)  . dexmedetomidine Stopped (07/21/17 0113)  . phenylephrine (NEO-SYNEPHRINE) Adult infusion Stopped (07/20/17 2015)   PRN Meds:.metoprolol tartrate, morphine injection, ondansetron (ZOFRAN) IV, oxyCODONE, sodium chloride flush, sodium chloride flush, traMADol  General appearance: alert and cooperative Neurologic: intact Heart: irregularly irregular rhythm Lungs: diminished breath sounds bibasilar Abdomen: soft, non-tender; bowel sounds normal; no masses,  no organomegaly Extremities: extremities normal, atraumatic, no cyanosis or edema Wound: Sternum stable  Lab Results: CBC: Recent Labs    07/21/17 1744 07/21/17 1759 07/22/17 0356  WBC 18.5*  --  17.2*  HGB 12.9* 13.6 12.0*  HCT 40.9 40.0 38.2*  PLT 145*  --  149*   BMET:  Recent Labs    07/21/17 0339  07/21/17 1759 07/22/17 0356  NA 137  --  137 135  K 4.4  --  3.9 4.1  CL 109  --  101 103  CO2 22  --   --  25  GLUCOSE 158*  --  147* 111*  BUN 17  --  16 14  CREATININE 0.96   < > 0.90 0.97  CALCIUM 7.9*  --   --  8.4*   < > = values in this interval not displayed.    CMET: Lab Results  Component Value Date   WBC 17.2 (H) 07/22/2017   HGB 12.0 (L) 07/22/2017   HCT 38.2 (L) 07/22/2017   PLT 149 (L) 07/22/2017   GLUCOSE 111 (H) 07/22/2017   CHOL 146 07/18/2017   TRIG 51 07/18/2017   HDL 48 07/18/2017   LDLCALC 88 07/18/2017   ALT 33 07/18/2017   AST 32 07/18/2017   NA 135 07/22/2017   K 4.1  07/22/2017   CL 103 07/22/2017   CREATININE 0.97 07/22/2017   BUN 14 07/22/2017   CO2 25 07/22/2017   TSH 3.340 11/27/2016   INR 1.10 07/18/2017   HGBA1C 5.9 (H) 07/20/2017      PT/INR: No results for input(s): LABPROT, INR in the last 72 hours. Radiology: No results found.   Assessment/Plan: S/P Procedure(s) (LRB): CORONARY ARTERY BYPASS GRAFTING (CABG) times four using left internal mammary artery to LAD and endoscopically harvested right saphenous vein graft to distal circumflex, intermediate, and PD (N/A) Mobilize Diuresis Diabetes control On IV amnio for postop atrial fib Expected blood loss anemia   Delight Ovensdward B Vardaan Depascale 07/22/2017 7:49 AM

## 2017-07-23 ENCOUNTER — Inpatient Hospital Stay (HOSPITAL_COMMUNITY): Payer: Medicare Other

## 2017-07-23 LAB — GLUCOSE, CAPILLARY
Glucose-Capillary: 117 mg/dL — ABNORMAL HIGH (ref 70–99)
Glucose-Capillary: 107 mg/dL — ABNORMAL HIGH (ref 70–99)
Glucose-Capillary: 75 mg/dL (ref 70–99)
Glucose-Capillary: 98 mg/dL (ref 70–99)
Glucose-Capillary: 94 mg/dL (ref 70–99)
Glucose-Capillary: 82 mg/dL (ref 70–99)

## 2017-07-23 LAB — CBC
HCT: 34.7 % — ABNORMAL LOW (ref 39.0–52.0)
Hemoglobin: 11.1 g/dL — ABNORMAL LOW (ref 13.0–17.0)
MCH: 28.9 pg (ref 26.0–34.0)
MCHC: 32 g/dL (ref 30.0–36.0)
MCV: 90.4 fL (ref 78.0–100.0)
Platelets: 145 10*3/uL — ABNORMAL LOW (ref 150–400)
RBC: 3.84 MIL/uL — ABNORMAL LOW (ref 4.22–5.81)
RDW: 14.2 % (ref 11.5–15.5)
WBC: 13.1 10*3/uL — ABNORMAL HIGH (ref 4.0–10.5)

## 2017-07-23 LAB — BASIC METABOLIC PANEL
Anion gap: 7 (ref 5–15)
BUN: 13 mg/dL (ref 8–23)
CO2: 26 mmol/L (ref 22–32)
Calcium: 8.2 mg/dL — ABNORMAL LOW (ref 8.9–10.3)
Chloride: 105 mmol/L (ref 98–111)
Creatinine, Ser: 0.91 mg/dL (ref 0.61–1.24)
GFR calc Af Amer: >60 mL/min (ref >60)
GFR calc non Af Amer: >60 mL/min (ref >60)
Glucose, Bld: 110 mg/dL — ABNORMAL HIGH (ref 70–99)
Potassium: 3.6 mmol/L (ref 3.5–5.1)
Sodium: 138 mmol/L (ref 135–145)

## 2017-07-23 LAB — TSH: TSH: 2.079 u[IU]/mL (ref 0.350–4.500)

## 2017-07-23 MED ORDER — AMIODARONE HCL 200 MG PO TABS
400.0000 mg | ORAL_TABLET | Freq: Two times a day (BID) | ORAL | Status: DC
  Administered 2017-07-23 – 2017-07-26 (×7): 400 mg via ORAL
  Filled 2017-07-23 (×7): qty 2

## 2017-07-23 MED ORDER — INSULIN ASPART 100 UNIT/ML ~~LOC~~ SOLN: 0.0000 [IU] | Freq: Three times a day (TID) | SUBCUTANEOUS | Status: DC

## 2017-07-23 MED ORDER — FUROSEMIDE 40 MG PO TABS
40.0000 mg | ORAL_TABLET | Freq: Every day | ORAL | Status: DC
  Administered 2017-07-23 – 2017-07-27 (×5): 40 mg via ORAL
  Filled 2017-07-23 (×5): qty 1

## 2017-07-23 MED ORDER — MOVING RIGHT ALONG BOOK
Freq: Once | Status: AC
  Administered 2017-07-23: 16:00:00
  Filled 2017-07-23: qty 1

## 2017-07-23 MED ORDER — SODIUM CHLORIDE 0.9% FLUSH
3.0000 mL | Freq: Two times a day (BID) | INTRAVENOUS | Status: DC
  Administered 2017-07-23: 3 mL via INTRAVENOUS

## 2017-07-23 MED ORDER — POTASSIUM CHLORIDE 10 MEQ/50ML IV SOLN
10.0000 meq | INTRAVENOUS | Status: DC | PRN
  Administered 2017-07-23: 10 meq via INTRAVENOUS
  Filled 2017-07-23 (×2): qty 50

## 2017-07-23 MED ORDER — POTASSIUM CHLORIDE CRYS ER 20 MEQ PO TBCR
40.0000 meq | EXTENDED_RELEASE_TABLET | Freq: Every day | ORAL | Status: DC
  Administered 2017-07-23 – 2017-07-26 (×4): 40 meq via ORAL
  Filled 2017-07-23 (×4): qty 2

## 2017-07-23 MED ORDER — SODIUM CHLORIDE 0.9% FLUSH: 3.0000 mL | INTRAVENOUS | Status: DC | PRN

## 2017-07-23 MED ORDER — SODIUM CHLORIDE 0.9 % IV SOLN: 250.0000 mL | INTRAVENOUS | Status: DC | PRN

## 2017-07-23 NOTE — Progress Notes (Signed)
Patient arrived to the unit from 2 heart. Patient is alert and oriented. He has a mid sternal incision with skin glue. Rt. Groin level 0 and Rt. Leg harvest level 0. Patient has pacer box set at Rate 70, U Output 10. Patient has a right IJ and and rt forearm peripheral both clean dry and intact. Placed patient on telemetry, called CCMD and notified X2. Patient denies any needs at this time. Will continue to monitor. Patient is sitting up in chair.  Sueanne Margaritaindy Leyli Kevorkian, RN

## 2017-07-23 NOTE — Progress Notes (Addendum)
TCTS DAILY ICU PROGRESS NOTE                   [address]            [place] [id number]          [phone]   3 Days Post-Op Procedure(s) (LRB): CORONARY ARTERY BYPASS GRAFTING (CABG) times four using left internal mammary artery to LAD and endoscopically harvested right saphenous vein graft to distal circumflex, intermediate, and PD (N/A)  Total Length of Stay:  LOS: 6 days   Subjective: Feels better today Maintaining sinus on amio gtt  Objective: Vital signs in last 24 hours: Temp:  [98.1 F (36.7 C)-100.5 F (38.1 C)] 98.6 F (37 C) (06/27 0728) Pulse Rate:  [71-108] 92 (06/27 0700) Cardiac Rhythm: Normal sinus rhythm (06/27 0400) Resp:  [15-27] 19 (06/27 0700) BP: (97-133)/(63-111) 121/79 (06/27 0700) SpO2:  [89 %-100 %] 97 % (06/27 0700) Weight:  [98.2 kg (216 lb 9.6 oz)] 98.2 kg (216 lb 9.6 oz) (06/27 0500)  Filed Weights   07/20/17 0334 07/21/17 0300 07/23/17 0500  Weight: 94.9 kg (209 lb 4.8 oz) 99.3 kg (218 lb 14.7 oz) 98.2 kg (216 lb 9.6 oz)    Weight change:    Hemodynamic parameters for last 24 hours:    Intake/Output from previous day: 06/26 0701 - 06/27 0700 In: 692.1 [P.O.:60; I.V.:511.4; IV Piggyback:120.7] Out: 2500 [Urine:2500]  Intake/Output this shift: No intake/output data recorded.  Current Meds: Scheduled Meds: . acetaminophen  1,000 mg Oral Q6H   Or  . acetaminophen (TYLENOL) oral liquid 160 mg/5 mL  1,000 mg Per Tube Q6H  . aspirin EC  325 mg Oral Daily   Or  . aspirin  324 mg Per Tube Daily  . atorvastatin  80 mg Oral q1800  . bisacodyl  10 mg Oral Daily   Or  . bisacodyl  10 mg Rectal Daily  . Chlorhexidine Gluconate Cloth  6 each Topical Q0600  . docusate sodium  200 mg Oral Daily  . enoxaparin (LOVENOX) injection  30 mg Subcutaneous QHS  . insulin aspart  0-24 Units Subcutaneous Q4H  . lisinopril  5 mg Oral Daily  . metoprolol tartrate  25 mg Oral BID   Or  . metoprolol tartrate  25 mg Per Tube BID  .  pantoprazole  40 mg Oral Daily  . sodium chloride flush  10-40 mL Intracatheter Q12H  . sodium chloride flush  3 mL Intravenous Q12H   Continuous Infusions: . sodium chloride 250 mL (07/21/17 0623)  . amiodarone 30 mg/hr (07/23/17 0700)  . dexmedetomidine Stopped (07/21/17 0113)  . phenylephrine (NEO-SYNEPHRINE) Adult infusion Stopped (07/20/17 2015)  . potassium chloride 50 mL/hr at 07/23/17 0700   PRN Meds:.metoprolol tartrate, morphine injection, ondansetron (ZOFRAN) IV, oxyCODONE, potassium chloride, sodium chloride flush, sodium chloride flush, traMADol  General appearance: alert, cooperative and no distress Heart: regular rate and rhythm Lungs: mildly dim in bases Abdomen: + BS, soft, nontender Extremities: + LE edema Wound: EVH incis healing well, chest dressing intact  Lab Results: CBC: Recent Labs    07/22/17 0356 07/23/17 0355  WBC 17.2* 13.1*  HGB 12.0* 11.1*  HCT 38.2* 34.7*  PLT 149* 145*   BMET:  Recent Labs    07/22/17 0356 07/23/17 0355  NA 135 138  K 4.1 3.6  CL 103 105  CO2 25 26  GLUCOSE 111* 110*  BUN 14 13  CREATININE 0.97 0.91  CALCIUM 8.4* 8.2*  CMET: Lab Results  Component Value Date   WBC 13.1 (H) 07/23/2017   HGB 11.1 (L) 07/23/2017   HCT 34.7 (L) 07/23/2017   PLT 145 (L) 07/23/2017   GLUCOSE 110 (H) 07/23/2017   CHOL 146 07/18/2017   TRIG 51 07/18/2017   HDL 48 07/18/2017   LDLCALC 88 07/18/2017   ALT 33 07/18/2017   AST 32 07/18/2017   NA 138 07/23/2017   K 3.6 07/23/2017   CL 105 07/23/2017   CREATININE 0.91 07/23/2017   BUN 13 07/23/2017   CO2 26 07/23/2017   TSH 3.340 11/27/2016   INR 1.10 07/18/2017   HGBA1C 5.9 (H) 07/20/2017      PT/INR: No results for input(s): LABPROT, INR in the last 72 hours. Radiology: No results found.   Assessment/Plan: S/P Procedure(s) (LRB): CORONARY ARTERY BYPASS GRAFTING (CABG) times four using left internal mammary artery to LAD and endoscopically harvested right saphenous  vein graft to distal circumflex, intermediate, and PD (N/A)   1 doing well overall 2 maintaining sinus rhythm,  change to po amio. BP ok on current ACE-I dose, cont to monitor. Check TSH 3 sats good on 2 liters- wean as able, push routine pulm toilet 4 cont cardiac rehab 5 K+ 3.6, renal fxn normal, cont to monitor- needs gentle diuresis 6 ABL anemia , mild- cont to monitor 7 A1c 5.9, BS adeq control. Dietary management of prediabetes 8 tx to 4E   [name] 07/23/2017 7:54 AM   I have seen and examined Thomas Miller and agree with the above assessment  and plan.  [name] MD Beeper [phone] Office [phone] 07/23/2017 8:37 AM

## 2017-07-24 LAB — CBC
HCT: 35.5 % — ABNORMAL LOW (ref 39.0–52.0)
Hemoglobin: 11.4 g/dL — ABNORMAL LOW (ref 13.0–17.0)
MCH: 28.9 pg (ref 26.0–34.0)
MCHC: 32.1 g/dL (ref 30.0–36.0)
MCV: 89.9 fL (ref 78.0–100.0)
Platelets: 188 10*3/uL (ref 150–400)
RBC: 3.95 MIL/uL — ABNORMAL LOW (ref 4.22–5.81)
RDW: 14.2 % (ref 11.5–15.5)
WBC: 12.1 10*3/uL — ABNORMAL HIGH (ref 4.0–10.5)

## 2017-07-24 LAB — BASIC METABOLIC PANEL
Anion gap: 8 (ref 5–15)
BUN: 15 mg/dL (ref 8–23)
CO2: 27 mmol/L (ref 22–32)
Calcium: 8.2 mg/dL — ABNORMAL LOW (ref 8.9–10.3)
Chloride: 105 mmol/L (ref 98–111)
Creatinine, Ser: 0.89 mg/dL (ref 0.61–1.24)
GFR calc Af Amer: >60 mL/min (ref >60)
GFR calc non Af Amer: >60 mL/min (ref >60)
Glucose, Bld: 105 mg/dL — ABNORMAL HIGH (ref 70–99)
Potassium: 3.6 mmol/L (ref 3.5–5.1)
Sodium: 140 mmol/L (ref 135–145)

## 2017-07-24 LAB — GLUCOSE, CAPILLARY
Glucose-Capillary: 91 mg/dL (ref 70–99)
Glucose-Capillary: 96 mg/dL (ref 70–99)
Glucose-Capillary: 96 mg/dL (ref 70–99)
Glucose-Capillary: 83 mg/dL (ref 70–99)

## 2017-07-24 MED ORDER — SODIUM CHLORIDE 0.9 % IV SOLN: 250.0000 mL | INTRAVENOUS | Status: DC | PRN

## 2017-07-24 MED ORDER — SODIUM CHLORIDE 0.9% FLUSH: 3.0000 mL | Freq: Two times a day (BID) | INTRAVENOUS | Status: DC

## 2017-07-24 MED ORDER — MOVING RIGHT ALONG BOOK
Freq: Once | Status: AC
  Administered 2017-07-24: 10:00:00
  Filled 2017-07-24: qty 1

## 2017-07-24 MED ORDER — SODIUM CHLORIDE 0.9% FLUSH: 3.0000 mL | INTRAVENOUS | Status: DC | PRN

## 2017-07-24 NOTE — Progress Notes (Signed)
Discontinued central line per provider orders. Patient tolerated removal well. Central Line removed intact, Pressure Applied for 8 minutes, Vaseline gauze and gauze dressing applied, vital signs are stable. Patient will rest in bed for 1 hour. Will continue to monitor patient.   Sueanne Margaritaindy Merle Cirelli, RN

## 2017-07-24 NOTE — Progress Notes (Signed)
IV Team: Spoke with Arline Aspindy RN, aware to d/c central line. Pt have 1 PIV.   Annett Fabianosalyn Tristian Bouska RN IV [facility] Team

## 2017-07-24 NOTE — Discharge Summary (Signed)
Physician Discharge Summary  Patient ID: Thomas Miller MRN: [id number] DOB/AGE: September 04, 1950 67 y.o.  Admit date: 07/17/2017 Discharge date: 07/27/2017  Admission Diagnoses: Unstable angina  Discharge Diagnoses:  Principal Problem:   Unstable angina [facility](HCC) Active Problems:   S/P CABG x 4   Atrial fibrillation [facility](HCC)   Patient Active Problem List   Diagnosis Date Noted  . Atrial fibrillation (HCC) 07/26/2017  . 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  . Unstable angina (HCC) 07/16/2017  . Lumbar radiculopathy 11/27/2016  . Advanced care planning/counseling discussion 11/27/2016  . Dermatitis 08/25/2016  . Fatigue 08/25/2016  . Insomnia 05/19/2016  . Hypertension 10/25/2014  . Gout 10/25/2014  . GERD (gastroesophageal reflux disease) 10/25/2014  . Hyperlipemia 10/25/2014  . BPH (benign prostatic hyperplasia) 10/25/2014    History of present illness:  The patient is a 67 year old male with a known history of hyperlipidemia and hypertension.  He recently has developed symptoms of substernal chest tightness with exertion.  This also would include radiation to the jaw, neck and left arm.  The patient notes that these episodes have been consistent with any exercise and relieved with rest.  He presented to his primary care physician who referred him for cardiac evaluation.  He was seen by Dr. Tomie Chinaevankar and cardiac catheterization was indicated.  This was performed by Dr. and and revealed disease in the LAD and RCA distributions.  He was felt to be a candidate for cardiothoracic surgical evaluation.  He was seen in consultation by Dr. Tyrone SageGerhardt who evaluated the patient and his studies and agreed with recommendations to proceed with CABG.  Discharged Condition: good  Hospital Course: The patient was taken to the operating room on 07/21/2017 at which time he underwent the below described procedure.  He tolerated well was taken to the surgical intensive care unit in stable condition.  Postoperative hospital  course:  The patient is done well and is overall progressing nicely.  He was weaned from the ventilator using standard protocol without difficulty. He has remained neurologically intact.  He did develop postoperative atrial fibrillation and is been placed on amiodarone as well as beta-blocker.  Cardiology is also assisting with management and prior to discharge will be determined if he requires anticoagulation therapy.  All routine lines, monitors and drainage devices have been discontinued in standard fashion.  He is tolerating gradually increasing activities using cardiac rehabilitation modalities.  Incisions are noted to be healing well without evidence of infection.  He did have some postoperative volume overload but is responding well to gentle diuresis.  He has a very mild postoperative anemia with hemoglobin of 12.6 on 07/25/2017.  Renal function is within normal limits with most recent BUN 16 and creatinine 1.07.  He has been ambulating on room air. Blood sugars have been under adequate control.  Hemoglobin A1c is 5.9 so we will need aggressive nutrition and lifestyle management postoperatively.  Nutrition recommendations have been included with his discharge instructions. Epicardial pacing wires and sutures were removed 07/01. Cardiology evaluated the patient again today. Because his QTc is over 500, Amiodarone has been decreased to 400 mg bid and this will continue for one week followed by 400 mg daily thereafter. Per cardiology, he has not had any a fib in the last 24 hours and does not require anticoagulation. He will see Dr. Tomie Chinaevankar in 2 weeks. As discussed with Dr. Tyrone SageGerhardt, the patient is felt surgically stable for discharge today. Arrangements for discharge were made once cardiology had made final medication recommendations.  Consults: cardiology  Significant Diagnostic Studies: angiography: Cardiac catheterization  Treatments: surgery:  PHYSICIAN:EDWARD Bari Edward, MD  OPERATIVE  REPORT  DATE OF PROCEDURE:  07/21/2017  PREOPERATIVE DIAGNOSIS:  Unstable angina with 3 vessel coronary artery disease.  POSTOPERATIVE DIAGNOSIS: Unstable angina with 3 vessel coronary artery disease.  SURGICAL PROCEDURE:  Coronary artery bypass grafting x4 with the left internal mammary to the left anterior descending coronary artery, reverse saphenous vein graft to the intermediate coronary artery, reverse saphenous vein graft to the distal  circumflex, reverse saphenous vein graft to the mid posterior descending with right greater saphenous endoscopic vein harvesting, thigh and upper calf.  SURGEON:   Sheliah Plane, MD  FIRST ASSISTANT:    Gershon Crane, [place]    Discharge Exam: Blood pressure 124/83, pulse 84, temperature 97.6 F (36.4 C), temperature source Oral, resp. rate 20, height 5\' 10"  (1.778 m), weight 203 lb 8 oz (92.3 kg), SpO2 97 %. Cardiovascular: RRR Pulmonary: Clear to auscultation bilaterally Abdomen: Soft, non tender, bowel sounds present. Extremities: No lower extremity edema. Wounds: Clean and dry.  No erythema or signs of infection   Disposition: Discharge disposition: 01-Home or Self Care     Discharge: Home   Discharge Instructions    Amb Referral to Cardiac Rehabilitation   Complete by:  As directed    Diagnosis:  CABG   CABG X ___:  4     Allergies as of 07/27/2017      Reactions   Codeine Other (See Comments)   Insomnia   Iodine Rash   Naprosyn [naproxen] Rash      Medication List    STOP taking these medications   aspirin 81 MG tablet Replaced by:  aspirin 325 MG EC tablet   meloxicam 15 MG tablet Commonly known as:  MOBIC   nitroGLYCERIN 0.4 MG SL tablet Commonly known as:  NITROSTAT   valsartan 160 MG tablet Commonly known as:  DIOVAN     TAKE these medications   albuterol 108 (90 Base) MCG/ACT inhaler Commonly known as:  PROVENTIL HFA;VENTOLIN HFA Inhale 2 puffs into the lungs every 6 (six) hours as needed for  wheezing or shortness of breath.   allopurinol 300 MG tablet Commonly known as:  ZYLOPRIM Take 1 tablet (300 mg total) by mouth daily.   amiodarone 400 MG tablet Commonly known as:  PACERONE Take 1 tablet (400 mg total) by mouth 2 (two) times daily. For one week then take 400 mg daily thereafter.   aspirin 325 MG EC tablet Take 1 tablet (325 mg total) by mouth daily. Start taking on:  07/28/2017 Replaces:  aspirin 81 MG tablet   atorvastatin 80 MG tablet Commonly known as:  LIPITOR Take 1 tablet (80 mg total) by mouth daily at 6 PM. What changed:    medication strength  how much to take   esomeprazole 40 MG capsule Commonly known as:  NEXIUM Take 1 capsule (40 mg total) by mouth daily before breakfast.   fluticasone 50 MCG/ACT nasal spray Commonly known as:  FLONASE Place 2 sprays into both nostrils daily.   lisinopril 5 MG tablet Commonly known as:  PRINIVIL,ZESTRIL Take 1 tablet (5 mg total) by mouth daily. Start taking on:  07/28/2017   metoprolol tartrate 50 MG tablet Commonly known as:  LOPRESSOR Take 1 tablet (50 mg total) by mouth 2 (two) times daily.   multivitamin tablet Take 1 tablet by mouth daily.   oxyCODONE 5 MG immediate release tablet Commonly known as:  Oxy  IR/ROXICODONE Take 1 tablet (5 mg total) by mouth every 6 (six) hours as needed for severe pain.     The patient has been discharged on:   1.Beta Blocker:  Yes Cove.Etienne   ]                              No   [   ]                              If No, reason:  2.Ace Inhibitor/ARB: Yes Cove.Etienne   ]                                     No  [    ]                                     If No, reason:  3.Statin:   Yes [ y  ]                  No  [   ]                  If No, reason:  4.Ecasa:  Yes  [ y  ]                  No   [   ]                  If No, reason:  Follow-up Information    Revankar, Aundra Dubin, MD Follow up on 08/07/2017.   Specialty:  Cardiology Why:  11:00 am Contact information: [phone]  [facility] [address] [address] [place]  [place] [address] [phone]        [name], MD Follow up.   Specialty:  Cardiothoracic Surgery Why:  Appointment to see the surgeon on 08/27/2017 at 3 PM.  Please obtain a chest x-ray at [facility] imaging at 2:30 PM.  [facility] imaging is located in the same office complex. Contact information: [address] [place] [address] [phone]        Steele Sizer, MD. Call.   Specialty:  Family Medicine Why:  for a follow up appointment regarding further surveillance of HGA1C 5.9 (pre diabetes) Contact information: [address] [place] [address] [phone]        Garwin Brothers, MD .   Specialty:  Cardiology Contact information: [address] [place] [address] [phone]           Signed: Elenore Rota 07/27/2017, 3:51 PM

## 2017-07-24 NOTE — Progress Notes (Addendum)
[address]       [place] [id number]             [phone]      4 Days Post-Op Procedure(s) (LRB): CORONARY ARTERY BYPASS GRAFTING (CABG) times four using left internal mammary artery to LAD and endoscopically harvested right saphenous vein graft to distal circumflex, intermediate, and PD (N/A) Subjective:  feels well, no specific c/0  Objective: Vital signs in last 24 hours: Temp:  [97.8 F (36.6 C)-98.6 F (37 C)] 97.9 F (36.6 C) (06/28 0438) Pulse Rate:  [80-104] 80 (06/28 0438) Cardiac Rhythm: Atrial fibrillation (06/28 0438) Resp:  [19-24] 22 (06/28 0438) BP: (118-138)/(61-93) 118/93 (06/28 0438) SpO2:  [90 %-100 %] 90 % (06/28 0438) Weight:  [96.6 kg (212 lb 14.4 oz)] 96.6 kg (212 lb 14.4 oz) (06/28 0438)  Hemodynamic parameters for last 24 hours:    Intake/Output from previous day: 06/27 0701 - 06/28 0700 In: 170 [P.O.:120; I.V.:20.8; IV Piggyback:29.3] Out: 250 [Urine:250] Intake/Output this shift: No intake/output data recorded.  General appearance: alert, cooperative and no distress Heart: regular rate and rhythm Lungs: clear to auscultation bilaterally Abdomen: benign Extremities: + LE edema Wound: incis healing well  Lab Results: Recent Labs    07/23/17 0355 07/24/17 0300  WBC 13.1* 12.1*  HGB 11.1* 11.4*  HCT 34.7* 35.5*  PLT 145* 188   BMET:  Recent Labs    07/23/17 0355 07/24/17 0300  NA 138 140  K 3.6 3.6  CL 105 105  CO2 26 27  GLUCOSE 110* 105*  BUN 13 15  CREATININE 0.91 0.89  CALCIUM 8.2* 8.2*    PT/INR: No results for input(s): LABPROT, INR in the last 72 hours. ABG    Component Value Date/Time   PHART 7.374 07/21/2017 0311   HCO3 20.0 07/21/2017 0311   TCO2 21 (L) 07/21/2017 1759   ACIDBASEDEF 5.0 (H) 07/21/2017 0311   O2SAT 95.0 07/21/2017 0311   CBG (last 3)  Recent Labs    07/23/17 1640 07/23/17 2104 07/24/17 0635  GLUCAP 94 82 91    Meds Scheduled Meds: . acetaminophen  1,000  mg Oral Q6H   Or  . acetaminophen (TYLENOL) oral liquid 160 mg/5 mL  1,000 mg Per Tube Q6H  . amiodarone  400 mg Oral BID  . aspirin EC  325 mg Oral Daily   Or  . aspirin  324 mg Per Tube Daily  . atorvastatin  80 mg Oral q1800  . bisacodyl  10 mg Oral Daily   Or  . bisacodyl  10 mg Rectal Daily  . docusate sodium  200 mg Oral Daily  . enoxaparin (LOVENOX) injection  30 mg Subcutaneous QHS  . furosemide  40 mg Oral Daily  . insulin aspart  0-24 Units Subcutaneous TID WC  . lisinopril  5 mg Oral Daily  . metoprolol tartrate  25 mg Oral BID   Or  . metoprolol tartrate  25 mg Per Tube BID  . pantoprazole  40 mg Oral Daily  . potassium chloride  40 mEq Oral Daily  . sodium chloride flush  3 mL Intravenous Q12H   Continuous Infusions: . sodium chloride     PRN Meds:.sodium chloride, metoprolol tartrate, ondansetron (ZOFRAN) IV, oxyCODONE, sodium chloride flush, traMADol  Xrays Dg Chest Port 1 View  Result Date: 07/23/2017 CLINICAL DATA:  Status post cardiac surgery EXAM: PORTABLE CHEST 1 VIEW COMPARISON:  07/22/2017 FINDINGS: Right jugular sheath is again noted. Cardiac shadow  is stable. Postsurgical changes are again noted. Left basilar atelectasis is seen. No pneumothorax is noted. IMPRESSION: Stable left basilar atelectasis. Electronically Signed   By: Alcide Clever M.D.   On: 07/23/2017 09:03    Assessment/Plan: S/P Procedure(s) (LRB): CORONARY ARTERY BYPASS GRAFTING (CABG) times four using left internal mammary artery to LAD and endoscopically harvested right saphenous vein graft to distal circumflex, intermediate, and PD (N/A)   1 doing well 2 some intermittent afib, cont current RX, hemodyn stable 3 routine pulm toilet and rehab 4 d/c central line and pacer 5 CBG's god control 6 labs very stable t cont gentle diuresis  LOS: 7 days    [name] 07/24/2017  Central line still in place , remove today In sinus now on beat blocker and Cordarone  Home 1-2 days  I  have seen and examined Thomas Miller and agree with the above assessment  and plan.  [name] MD Beeper [phone] Office [phone] 07/24/2017 9:21 AM

## 2017-07-24 NOTE — Care Management Important Message (Signed)
Important Message  Patient Details  Name: Thomas Miller MRN: [id number] Date of Birth: 1950-02-23   Medicare Important Message Given:  Yes    Oralia RudMegan P Christena Sunderlin 07/24/2017, 3:27 PM

## 2017-07-24 NOTE — Progress Notes (Signed)
CARDIAC REHAB PHASE I   PRE:  Rate/Rhythm: 93 SR    BP: sitting 131/87    SaO2: 92 RA  MODE:  Ambulation: 430 ft   POST:  Rate/Rhythm: 102 ST    BP: sitting 150/95     SaO2: 95 RA  Tolerated well, moving well. No afib walking. He did have bigeminy PACs with some pacing briefly in bed after walk. Returned to NSR after 30sec. Ed completed with pt and family. Good reception. Will refer to [facility]. He is doing well with IS/flutter and walking independently. [phone]   Harriet MassonRandi Kristan Yandel Zeiner CES, ACSM 07/24/2017 10:32 AM

## 2017-07-25 LAB — GLUCOSE, CAPILLARY
Glucose-Capillary: 92 mg/dL (ref 70–99)
Glucose-Capillary: 93 mg/dL (ref 70–99)
Glucose-Capillary: 94 mg/dL (ref 70–99)
Glucose-Capillary: 100 mg/dL — ABNORMAL HIGH (ref 70–99)

## 2017-07-25 LAB — BASIC METABOLIC PANEL
Anion gap: 8 (ref 5–15)
BUN: 16 mg/dL (ref 8–23)
CO2: 30 mmol/L (ref 22–32)
Calcium: 9.2 mg/dL (ref 8.9–10.3)
Chloride: 105 mmol/L (ref 98–111)
Creatinine, Ser: 1.07 mg/dL (ref 0.61–1.24)
GFR calc Af Amer: >60 mL/min (ref >60)
GFR calc non Af Amer: >60 mL/min (ref >60)
Glucose, Bld: 107 mg/dL — ABNORMAL HIGH (ref 70–99)
Potassium: 4.5 mmol/L (ref 3.5–5.1)
Sodium: 143 mmol/L (ref 135–145)

## 2017-07-25 LAB — CBC
HCT: 39.6 % (ref 39.0–52.0)
Hemoglobin: 12.6 g/dL — ABNORMAL LOW (ref 13.0–17.0)
MCH: 28.5 pg (ref 26.0–34.0)
MCHC: 31.8 g/dL (ref 30.0–36.0)
MCV: 89.6 fL (ref 78.0–100.0)
Platelets: 237 10*3/uL (ref 150–400)
RBC: 4.42 MIL/uL (ref 4.22–5.81)
RDW: 14.2 % (ref 11.5–15.5)
WBC: 13.8 10*3/uL — ABNORMAL HIGH (ref 4.0–10.5)

## 2017-07-25 MED ORDER — PANTOPRAZOLE SODIUM 40 MG PO TBEC
40.0000 mg | DELAYED_RELEASE_TABLET | Freq: Two times a day (BID) | ORAL | Status: DC
  Administered 2017-07-25 – 2017-07-27 (×4): 40 mg via ORAL
  Filled 2017-07-25 (×4): qty 1

## 2017-07-25 MED ORDER — METOPROLOL TARTRATE 25 MG/10 ML ORAL SUSPENSION: 25.0000 mg | Freq: Two times a day (BID) | ORAL | Status: DC

## 2017-07-25 MED ORDER — METOPROLOL TARTRATE 50 MG PO TABS
50.0000 mg | ORAL_TABLET | Freq: Two times a day (BID) | ORAL | Status: DC
  Administered 2017-07-25 – 2017-07-27 (×5): 50 mg via ORAL
  Filled 2017-07-25 (×5): qty 1

## 2017-07-25 NOTE — Progress Notes (Signed)
Patient walked for about 480 ft.tolerated well.

## 2017-07-25 NOTE — Progress Notes (Addendum)
[address]       [place] [address]             [phone]      5 Days Post-Op Procedure(s) (LRB): CORONARY ARTERY BYPASS GRAFTING (CABG) times four using left internal mammary artery to LAD and endoscopically harvested right saphenous vein graft to distal circumflex, intermediate, and PD (N/A) Subjective: Feels ok except significant acid reflux HR elevated in afib to 120's at times   Objective: Vital signs in last 24 hours: Temp:  [98.2 F (36.8 C)-98.7 F (37.1 C)] 98.2 F (36.8 C) (06/29 0434) Pulse Rate:  [87-102] 87 (06/29 0434) Cardiac Rhythm: Sinus tachycardia (06/29 0700) Resp:  [20-23] 20 (06/29 0434) BP: (119-151)/(79-87) 125/79 (06/29 0434) SpO2:  [90 %-95 %] 93 % (06/29 0434) Weight:  [94.5 kg (208 lb 4.8 oz)] 94.5 kg (208 lb 4.8 oz) (06/29 0434)  Hemodynamic parameters for last 24 hours:    Intake/Output from previous day: 06/28 0701 - 06/29 0700 In: 460 [P.O.:460] Out: 975 [Urine:975] Intake/Output this shift: No intake/output data recorded.  General appearance: alert, cooperative and no distress Heart: regular rate and rhythm Lungs: clear to auscultation bilaterally Abdomen: benign exam Extremities: minor edema Wound: incis healing well  Lab Results: Recent Labs    07/24/17 0300 07/25/17 0310  WBC 12.1* 13.8*  HGB 11.4* 12.6*  HCT 35.5* 39.6  PLT 188 237   BMET:  Recent Labs    07/24/17 0300 07/25/17 0310  NA 140 143  K 3.6 4.5  CL 105 105  CO2 27 30  GLUCOSE 105* 107*  BUN 15 16  CREATININE 0.89 1.07  CALCIUM 8.2* 9.2    PT/INR: No results for input(s): LABPROT, INR in the last 72 hours. ABG    Component Value Date/Time   PHART 7.374 07/21/2017 0311   HCO3 20.0 07/21/2017 0311   TCO2 21 (L) 07/21/2017 1759   ACIDBASEDEF 5.0 (H) 07/21/2017 0311   O2SAT 95.0 07/21/2017 0311   CBG (last 3)  Recent Labs    07/24/17 1602 07/24/17 2101 07/25/17 0559  GLUCAP 96 83 92    Meds Scheduled Meds: .  acetaminophen  1,000 mg Oral Q6H   Or  . acetaminophen (TYLENOL) oral liquid 160 mg/5 mL  1,000 mg Per Tube Q6H  . amiodarone  400 mg Oral BID  . aspirin EC  325 mg Oral Daily   Or  . aspirin  324 mg Per Tube Daily  . atorvastatin  80 mg Oral q1800  . bisacodyl  10 mg Oral Daily   Or  . bisacodyl  10 mg Rectal Daily  . docusate sodium  200 mg Oral Daily  . enoxaparin (LOVENOX) injection  30 mg Subcutaneous QHS  . furosemide  40 mg Oral Daily  . insulin aspart  0-24 Units Subcutaneous TID WC  . lisinopril  5 mg Oral Daily  . metoprolol tartrate  25 mg Oral BID   Or  . metoprolol tartrate  25 mg Per Tube BID  . pantoprazole  40 mg Oral Daily  . potassium chloride  40 mEq Oral Daily   Continuous Infusions: PRN Meds:.metoprolol tartrate, ondansetron (ZOFRAN) IV, oxyCODONE, traMADol  Xrays No results found.  Assessment/Plan: S/P Procedure(s) (LRB): CORONARY ARTERY BYPASS GRAFTING (CABG) times four using left internal mammary artery to LAD and endoscopically harvested right saphenous vein graft to distal circumflex, intermediate, and PD (N/A)  1 afib- RVR at times- cont amio, will increase betablocker dose May  [facility] 2 will make protonix BID short term 3 CBG's well controlled 4 slight increase in leukocytosis, no fevers, cont to monitor clinically 5 renal fxn remains stable, cont gentle diuresis for mild volume overload 6 cont pulm toilet/cardiac rehab  LOS: 8 days    Rowe ClackWayne E Gold 07/25/2017 In sinus all day Home 1-2 days if remains in sinus I have seen and examined Michae KavaLarry G Weems and agree with the above assessment  and plan.  Delight OvensEdward B Kc Summerson MD Beeper [phone] Office [phone] 07/25/2017 6:22 PM

## 2017-07-25 NOTE — Plan of Care (Signed)
  Problem: Education: Goal: Knowledge of General Education information will improve Outcome: Progressing   Problem: Health Behavior/Discharge Planning: Goal: Ability to manage health-related needs will improve Outcome: Progressing   Problem: Clinical Measurements: Goal: Will remain free from infection Outcome: Progressing   Problem: Coping: Goal: Level of anxiety will decrease Outcome: Progressing   Problem: Pain Managment: Goal: General experience of comfort will improve Outcome: Progressing

## 2017-07-25 NOTE — Progress Notes (Signed)
CARDIAC REHAB PHASE I   PRE:  Rate/Rhythm: 93 SR    BP: sitting 136/90    SaO2:   MODE:  Ambulation: 750 ft   POST:  Rate/Rhythm: 112 ST    BP: sitting 152/91     SaO2: 96 RA  Doing great. Independently standing and walking. No afib noted. Slightly tachy and BP up but just took meds. Gave reminders to walk and IS. [phone]  Harriet MassonRandi Kristan Tnia Anglada CES, ACSM 07/25/2017 10:32 AM

## 2017-07-26 DIAGNOSIS — I4891 Unspecified atrial fibrillation: Secondary | ICD-10-CM

## 2017-07-26 DIAGNOSIS — I481 Persistent atrial fibrillation: Secondary | ICD-10-CM

## 2017-07-26 DIAGNOSIS — Z951 Presence of aortocoronary bypass graft: Secondary | ICD-10-CM

## 2017-07-26 LAB — GLUCOSE, CAPILLARY
Glucose-Capillary: 104 mg/dL — ABNORMAL HIGH (ref 70–99)
Glucose-Capillary: 121 mg/dL — ABNORMAL HIGH (ref 70–99)
Glucose-Capillary: 108 mg/dL — ABNORMAL HIGH (ref 70–99)
Glucose-Capillary: 81 mg/dL (ref 70–99)

## 2017-07-26 MED ORDER — AMIODARONE HCL 200 MG PO TABS
400.0000 mg | ORAL_TABLET | Freq: Three times a day (TID) | ORAL | Status: DC
  Administered 2017-07-26 – 2017-07-27 (×3): 400 mg via ORAL
  Filled 2017-07-26 (×3): qty 2

## 2017-07-26 MED ORDER — AMIODARONE HCL 200 MG PO TABS: 400.0000 mg | ORAL_TABLET | Freq: Three times a day (TID) | ORAL | Status: DC

## 2017-07-26 NOTE — Progress Notes (Addendum)
[address]       [place] [id number]             [phone]      6 Days Post-Op Procedure(s) (LRB): CORONARY ARTERY BYPASS GRAFTING (CABG) times four using left internal mammary artery to LAD and endoscopically harvested right saphenous vein graft to distal circumflex, intermediate, and PD (N/A) Subjective: Back in afib with RVR this am  Objective: Vital signs in last 24 hours: Temp:  [97.7 F (36.5 C)-98.4 F (36.9 C)] 98.4 F (36.9 C) (06/30 0614) Pulse Rate:  [90-101] 94 (06/30 0614) Cardiac Rhythm: Normal sinus rhythm (06/29 1944) Resp:  [21-22] 22 (06/30 0614) BP: (121-128)/(85-99) 121/85 (06/30 0614) SpO2:  [95 %-98 %] 95 % (06/30 0614) Weight:  [93.2 kg (205 lb 6.4 oz)] 93.2 kg (205 lb 6.4 oz) (06/30 0444)  Hemodynamic parameters for last 24 hours:    Intake/Output from previous day: 06/29 0701 - 06/30 0700 In: 500 [P.O.:500] Out: -  Intake/Output this shift: No intake/output data recorded.  General appearance: alert, cooperative and no distress Heart: irregularly irregular rhythm Lungs: clear to auscultation bilaterally Abdomen: benign Extremities: + LE edema Wound: incis healing well  Lab Results: Recent Labs    07/24/17 0300 07/25/17 0310  WBC 12.1* 13.8*  HGB 11.4* 12.6*  HCT 35.5* 39.6  PLT 188 237   BMET:  Recent Labs    07/24/17 0300 07/25/17 0310  NA 140 143  K 3.6 4.5  CL 105 105  CO2 27 30  GLUCOSE 105* 107*  BUN 15 16  CREATININE 0.89 1.07  CALCIUM 8.2* 9.2    PT/INR: No results for input(s): LABPROT, INR in the last 72 hours. ABG    Component Value Date/Time   PHART 7.374 07/21/2017 0311   HCO3 20.0 07/21/2017 0311   TCO2 21 (L) 07/21/2017 1759   ACIDBASEDEF 5.0 (H) 07/21/2017 0311   O2SAT 95.0 07/21/2017 0311   CBG (last 3)  Recent Labs    07/25/17 1631 07/25/17 2005 07/26/17 0617  GLUCAP 94 100* 104*    Meds Scheduled Meds: . amiodarone  400 mg Oral BID  . aspirin EC  325 mg Oral  Daily   Or  . aspirin  324 mg Per Tube Daily  . atorvastatin  80 mg Oral q1800  . bisacodyl  10 mg Oral Daily   Or  . bisacodyl  10 mg Rectal Daily  . docusate sodium  200 mg Oral Daily  . enoxaparin (LOVENOX) injection  30 mg Subcutaneous QHS  . furosemide  40 mg Oral Daily  . insulin aspart  0-24 Units Subcutaneous TID WC  . lisinopril  5 mg Oral Daily  . metoprolol tartrate  50 mg Oral BID   Or  . metoprolol tartrate  25 mg Per Tube BID  . pantoprazole  40 mg Oral BID AC  . potassium chloride  40 mEq Oral Daily   Continuous Infusions: PRN Meds:.metoprolol tartrate, ondansetron (ZOFRAN) IV, oxyCODONE, traMADol  Xrays No results found.  Assessment/Plan: S/P Procedure(s) (LRB): CORONARY ARTERY BYPASS GRAFTING (CABG) times four using left internal mammary artery to LAD and endoscopically harvested right saphenous vein graft to distal circumflex, intermediate, and PD (N/A)   1 Afib with HR 120'-130's at times, cont amio and lopressor( dose increased yesterday to 50 BID). May need AC RX if not resolved soon 2 BP adeq control on current RX 3 BS adeq control- will d/c SSI and see how he does-  no meds preop 4 recheck labs in am 5 no fevers 6 cont pulm toilet and rehab 7 cont diuretics for now 8 will need epw's d/c when rhythm management is stable  LOS: 9 days    Thomas Miller 07/26/2017 I have seen and examined Thomas Miller and agree with the above assessment  and plan. Cardiology to see about afib and follow up Thomas Ovens MD Beeper [phone] Office [phone] 07/26/2017 3:00 PM

## 2017-07-26 NOTE — Progress Notes (Signed)
DAILY PROGRESS NOTE   Patient Name: Thomas Miller Date of Encounter: 07/26/2017  Chief Complaint   Chest soreness  Patient Profile   67 yo male with HTN, dyslipidemia and symptoms of unstable angina. Found to have 3 vessel CAD by cath and CABG was performed on 07/20/17. Now with post-surgical PAF.  Subjective   Asked by Dr. Servando Snare to make recommendations regarding a-fib management and anticoagulation. Noted to develop post-operative a-fib on 6/25 in the evening - placed on amiodarone gtts and then converted back to sinus - switched to po amiodarone. Metoprolol increased. Has had intermittent a-fib with RVR. Asked to see about rate/rhythm control and anticoagulation recommendations.  Objective   Vitals:   07/25/17 1918 07/26/17 0444 07/26/17 0614 07/26/17 1145  BP: 128/86  121/85 117/80  Pulse: (!) 101  94 (!) 9  Resp: (!) 21  (!) 22 15  Temp: 98.4 F (36.9 C)  98.4 F (36.9 C) 98.4 F (36.9 C)  TempSrc: Oral  Oral Oral  SpO2: 95%  95% 95%  Weight:  205 lb 6.4 oz (93.2 kg)    Height:        Intake/Output Summary (Last 24 hours) at 07/26/2017 1355 Last data filed at 07/26/2017 0900 Gross per 24 hour  Intake 325 ml  Output -  Net 325 ml   Filed Weights   07/24/17 0438 07/25/17 0434 07/26/17 0444  Weight: 212 lb 14.4 oz (96.6 kg) 208 lb 4.8 oz (94.5 kg) 205 lb 6.4 oz (93.2 kg)    Physical Exam   General appearance: alert and no distress Neck: no carotid bruit, no JVD and thyroid not enlarged, symmetric, no tenderness/mass/nodules Lungs: clear to auscultation bilaterally and median sternomy is healing well Heart: regular rate and rhythm, S1, S2 normal, no murmur, click, rub or gallop Abdomen: soft, non-tender; bowel sounds normal; no masses,  no organomegaly Extremities: extremities normal, atraumatic, no cyanosis or edema Pulses: 2+ and symmetric Skin: Skin color, texture, turgor normal. No rashes or lesions Neurologic: Grossly normal Psych:  Pleasant  Inpatient Medications    Scheduled Meds: . amiodarone  400 mg Oral BID  . aspirin EC  325 mg Oral Daily   Or  . aspirin  324 mg Per Tube Daily  . atorvastatin  80 mg Oral q1800  . bisacodyl  10 mg Oral Daily   Or  . bisacodyl  10 mg Rectal Daily  . docusate sodium  200 mg Oral Daily  . enoxaparin (LOVENOX) injection  30 mg Subcutaneous QHS  . furosemide  40 mg Oral Daily  . lisinopril  5 mg Oral Daily  . metoprolol tartrate  50 mg Oral BID   Or  . metoprolol tartrate  25 mg Per Tube BID  . pantoprazole  40 mg Oral BID AC  . potassium chloride  40 mEq Oral Daily    Continuous Infusions:   PRN Meds: metoprolol tartrate, ondansetron (ZOFRAN) IV, oxyCODONE, traMADol   Labs   Results for orders placed or performed during the hospital encounter of 07/17/17 (from the past 48 hour(s))  Glucose, capillary     Status: None   Collection Time: 07/24/17  4:02 PM  Result Value Ref Range   Glucose-Capillary 96 70 - 99 mg/dL   Comment 1 Notify RN   Glucose, capillary     Status: None   Collection Time: 07/24/17  9:01 PM  Result Value Ref Range   Glucose-Capillary 83 70 - 99 mg/dL  CBC     Status:  Abnormal   Collection Time: 07/25/17  3:10 AM  Result Value Ref Range   WBC 13.8 (H) 4.0 - 10.5 K/uL   RBC 4.42 4.22 - 5.81 MIL/uL   Hemoglobin 12.6 (L) 13.0 - 17.0 g/dL   HCT 39.6 39.0 - 52.0 %   MCV 89.6 78.0 - 100.0 fL   MCH 28.5 26.0 - 34.0 pg   MCHC 31.8 30.0 - 36.0 g/dL   RDW 14.2 11.5 - 15.5 %   Platelets 237 150 - 400 K/uL    Comment: Performed at [facility], [place] [address], [place], [place] [address]  Basic metabolic panel     Status: Abnormal   Collection Time: 07/25/17  3:10 AM  Result Value Ref Range   Sodium 143 135 - 145 mmol/L   Potassium 4.5 3.5 - 5.1 mmol/L    Comment: DELTA CHECK NOTED   Chloride 105 98 - 111 mmol/L    Comment: Please note change in reference range.   CO2 30 22 - 32 mmol/L   Glucose, Bld 107 (H) 70 - 99 mg/dL    Comment:  Please note change in reference range.   BUN 16 8 - 23 mg/dL    Comment: Please note change in reference range.   Creatinine, Ser 1.07 0.61 - 1.24 mg/dL   Calcium 9.2 8.9 - 10.3 mg/dL   GFR calc non Af Amer >60 >60 mL/min   GFR calc Af Amer >60 >60 mL/min    Comment: (NOTE) The eGFR has been calculated using the CKD EPI equation. This calculation has not been validated in all clinical situations. eGFR's persistently <60 mL/min signify possible Chronic Kidney Disease.    Anion gap 8 5 - 15    Comment: Performed at [facility] [address] [place], [place] [address]  Glucose, capillary     Status: None   Collection Time: 07/25/17  5:59 AM  Result Value Ref Range   Glucose-Capillary 92 70 - 99 mg/dL  Glucose, capillary     Status: None   Collection Time: 07/25/17 11:26 AM  Result Value Ref Range   Glucose-Capillary 93 70 - 99 mg/dL  Glucose, capillary     Status: None   Collection Time: 07/25/17  4:31 PM  Result Value Ref Range   Glucose-Capillary 94 70 - 99 mg/dL  Glucose, capillary     Status: Abnormal   Collection Time: 07/25/17  8:05 PM  Result Value Ref Range   Glucose-Capillary 100 (H) 70 - 99 mg/dL  Glucose, capillary     Status: Abnormal   Collection Time: 07/26/17  6:17 AM  Result Value Ref Range   Glucose-Capillary 104 (H) 70 - 99 mg/dL  Glucose, capillary     Status: Abnormal   Collection Time: 07/26/17 11:47 AM  Result Value Ref Range   Glucose-Capillary 121 (H) 70 - 99 mg/dL    ECG   N/A  Telemetry   Sinus rhythm currently, but afib with RVR noted this am - Personally Reviewed  Radiology    No results found.  Cardiac Studies   N/A  Assessment   1. Principal Problem: 2.   Unstable angina ([place]) 3. Active Problems: 4.   S/P CABG x 4 5.   Atrial fibrillation ([place]) 6.   Plan   Continues to have short duration PAF - now back in sinus. Would not recommend starting anticoagulation at this point - this is all likely post-op a-fib. May  have had insufficient IV amiodarone loading -  would increase amiodarone to 400 mg TID for 1 week, then 400 mg BID x 7 days and taper to 400 mg daily thereafter. He is noted to have rash with iodine - monitor for occurrence of this since amiodarone contains a fair amount of this. If he has evidence for recurrent a-fib at follow-up, would consider anticoagulation. There is an appt with Dr. Lennox Pippins scheduled about 1 month from now - perhaps we can arrange for an earlier appt.  Time Spent Directly with Patient:  I have spent a total of 25 minutes with the patient reviewing hospital notes, telemetry, EKGs, labs and examining the patient as well as establishing an assessment and plan that was discussed personally with the patient.  > 50% of time was spent in direct patient care.  Length of Stay:  LOS: 9 days   Pixie Casino, MD, [facility], [place] Director of the Advanced Lipid Disorders &  Cardiovascular Risk Reduction Clinic Diplomate of the American Board of Clinical Lipidology Attending Cardiologist  Direct Dial: [phone]  Fax: [phone]  Website:  [email or web address] 07/26/2017, 1:55 PM

## 2017-07-27 DIAGNOSIS — I480 Paroxysmal atrial fibrillation: Secondary | ICD-10-CM

## 2017-07-27 LAB — CBC
HCT: 40.8 % (ref 39.0–52.0)
Hemoglobin: 12.9 g/dL — ABNORMAL LOW (ref 13.0–17.0)
MCH: 28.8 pg (ref 26.0–34.0)
MCHC: 31.6 g/dL (ref 30.0–36.0)
MCV: 91.1 fL (ref 78.0–100.0)
Platelets: 316 10*3/uL (ref 150–400)
RBC: 4.48 MIL/uL (ref 4.22–5.81)
RDW: 14.4 % (ref 11.5–15.5)
WBC: 12.8 10*3/uL — ABNORMAL HIGH (ref 4.0–10.5)

## 2017-07-27 LAB — ECHO TEE
AV Mean grad: 5 mmHg
Ao-asc: 3.4 cm
LVOT diameter: 22 mm
Mean grad: 1 mmHg
STJ: 3 cm
Sinus: 3.8 cm

## 2017-07-27 LAB — BASIC METABOLIC PANEL
Anion gap: 9 (ref 5–15)
BUN: 18 mg/dL (ref 8–23)
CO2: 29 mmol/L (ref 22–32)
Calcium: 9.3 mg/dL (ref 8.9–10.3)
Chloride: 101 mmol/L (ref 98–111)
Creatinine, Ser: 1.18 mg/dL (ref 0.61–1.24)
GFR calc Af Amer: >60 mL/min (ref >60)
GFR calc non Af Amer: >60 mL/min (ref >60)
Glucose, Bld: 106 mg/dL — ABNORMAL HIGH (ref 70–99)
Potassium: 4.9 mmol/L (ref 3.5–5.1)
Sodium: 139 mmol/L (ref 135–145)

## 2017-07-27 LAB — GLUCOSE, CAPILLARY
Glucose-Capillary: 97 mg/dL (ref 70–99)
Glucose-Capillary: 86 mg/dL (ref 70–99)

## 2017-07-27 MED ORDER — ATORVASTATIN CALCIUM 80 MG PO TABS: 80.0000 mg | ORAL_TABLET | Freq: Every day | ORAL | 1 refills | Status: DC

## 2017-07-27 MED ORDER — AMIODARONE HCL 200 MG PO TABS: 400.0000 mg | ORAL_TABLET | Freq: Two times a day (BID) | ORAL | Status: DC

## 2017-07-27 MED ORDER — AMIODARONE HCL 400 MG PO TABS: 400.0000 mg | ORAL_TABLET | Freq: Two times a day (BID) | ORAL | 1 refills | Status: DC

## 2017-07-27 MED ORDER — ASPIRIN 325 MG PO TBEC: 325.0000 mg | DELAYED_RELEASE_TABLET | Freq: Every day | ORAL | 0 refills | Status: DC

## 2017-07-27 MED ORDER — METOPROLOL TARTRATE 50 MG PO TABS: 50.0000 mg | ORAL_TABLET | Freq: Two times a day (BID) | ORAL | 1 refills | Status: DC

## 2017-07-27 MED ORDER — LISINOPRIL 5 MG PO TABS: 5.0000 mg | ORAL_TABLET | Freq: Every day | ORAL | 1 refills | Status: DC

## 2017-07-27 MED ORDER — OXYCODONE HCL 5 MG PO TABS: 5.0000 mg | ORAL_TABLET | Freq: Four times a day (QID) | ORAL | 0 refills | Status: DC | PRN

## 2017-07-27 NOTE — Care Management Note (Signed)
Case Management Note Thomas PieriniKristi Zamarah Ullmer RN,BSN Unit [facility]2H 1-22 Case Manager  [phone]  Patient Details  Name: Thomas Miller MRN: [id number] Date of Birth: 28-Feb-1950  Subjective/Objective:  Pt admitted with [place], MVD found- now s/p CABGx4 on 07/20/17                   Action/Plan: PTA pt lived at home with spouse- anticipate return home- CM to follow for transition of care needs  Expected Discharge Date:  07/27/17               Expected Discharge Plan:  Home/Self Care  In-House Referral:  NA  Discharge planning Services  CM Consult  Post Acute Care Choice:    Choice offered to:     DME Arranged:    DME Agency:     HH Arranged:    HH Agency:     Status of Service:  Completed, signed off  If discussed at Long Length of Stay Meetings, dates discussed:    Discharge Disposition: home/self care   Additional Comments:  07/27/17- 1430- Thomas PieriniKristi Sayana Salley RN, CM- pt for discharge home today- no further CM needs noted for transition home.   Darrold SpanWebster, Hiroshi Krummel Hall, RN 07/27/2017, 2:39 PM

## 2017-07-27 NOTE — Progress Notes (Addendum)
CARDIOLOGY RECOMMENDATIONS:  Discharge is anticipated in the next 48 hours. Recommendations for medications and follow up:  Discharge Medications: Continue medications as they are currently listed in the [facility]. Exceptions to the above:  Amiodarone 400 mg p.o. twice daily for 1 week then 400 mg daily until seen by primary cardiologist in 2 weeks.  At that point amiodarone can be decreased further based upon his recommendations.  Total therapy will probably need to be only 4 to 6 weeks.  Follow Up: The patient's Primary Cardiologist is Garwin Brothers, MD 08/07/2017  Follow up in the office in 2 week(s).  Signed,  Lesleigh Noe, MD  12:12 PM 07/27/2017  [facility]   Progress Note is the 100 called the  Patient Name: Thomas Miller Date of Encounter: 07/27/2017  Primary Cardiologist: No primary care provider on file.  Subjective   Feeling well, and ready to go home. Just walked in the hallway.   Inpatient Medications    Scheduled Meds: . amiodarone  400 mg Oral BID  . aspirin EC  325 mg Oral Daily   Or  . aspirin  324 mg Per Tube Daily  . atorvastatin  80 mg Oral q1800  . bisacodyl  10 mg Oral Daily   Or  . bisacodyl  10 mg Rectal Daily  . docusate sodium  200 mg Oral Daily  . enoxaparin (LOVENOX) injection  30 mg Subcutaneous QHS  . furosemide  40 mg Oral Daily  . lisinopril  5 mg Oral Daily  . metoprolol tartrate  50 mg Oral BID  . pantoprazole  40 mg Oral BID AC   Continuous Infusions:  PRN Meds: metoprolol tartrate, ondansetron (ZOFRAN) IV, oxyCODONE, traMADol   Vital Signs    Vitals:   07/26/17 2015 07/27/17 0428 07/27/17 0750 07/27/17 0940  BP: 116/82  124/85 (!) 139/92  Pulse: 91  86 91  Resp: 19  17   Temp:  98.9 F (37.2 C) 97.9 F (36.6 C)   TempSrc:  Oral Oral   SpO2: 94%  100%   Weight:  203 lb 8 oz (92.3 kg)    Height:        Intake/Output Summary (Last 24 hours) at 07/27/2017 1138 Last data filed at 07/27/2017 0846 Gross per 24  hour  Intake 712 ml  Output -  Net 712 ml   Filed Weights   07/25/17 0434 07/26/17 0444 07/27/17 0428  Weight: 208 lb 4.8 oz (94.5 kg) 205 lb 6.4 oz (93.2 kg) 203 lb 8 oz (92.3 kg)    Telemetry    SR - Personally Reviewed  Physical Exam   General: Well developed, well nourished, male appearing in no acute distress. Head: Normocephalic, atraumatic.  Neck: Supple without bruits, JVD. Lungs:  Resp regular and unlabored, CTA. Heart: RRR, S1, S2, no S3, S4, or murmur; no rub. Abdomen: Soft, non-tender, non-distended with normoactive bowel sounds.  Extremities: No clubbing, cyanosis, edema. Distal pedal pulses are 2+ bilaterally. Neuro: Alert and oriented X 3. Moves all extremities spontaneously. Psych: Normal affect.  Labs    Chemistry Recent Labs  Lab 07/24/17 0300 07/25/17 0310 07/27/17 0432  NA 140 143 139  K 3.6 4.5 4.9  CL 105 105 101  CO2 27 30 29   GLUCOSE 105* 107* 106*  BUN 15 16 18   CREATININE 0.89 1.07 1.18  CALCIUM 8.2* 9.2 9.3  GFRNONAA >60 >60 >60  GFRAA >60 >60 >60  ANIONGAP 8 8 9  Hematology Recent Labs  Lab 07/24/17 0300 07/25/17 0310 07/27/17 0432  WBC 12.1* 13.8* 12.8*  RBC 3.95* 4.42 4.48  HGB 11.4* 12.6* 12.9*  HCT 35.5* 39.6 40.8  MCV 89.9 89.6 91.1  MCH 28.9 28.5 28.8  MCHC 32.1 31.8 31.6  RDW 14.2 14.2 14.4  PLT 188 237 316    Cardiac EnzymesNo results for input(s): TROPONINI in the last 168 hours. No results for input(s): TROPIPOC in the last 168 hours.   BNPNo results for input(s): BNP, PROBNP in the last 168 hours.   DDimer No results for input(s): DDIMER in the last 168 hours.    Radiology    No results found.  Cardiac Studies   07/17/2017 ECHO Left ventricle: The cavity size was normal. There was mild concentric hypertrophy. Systolic function was normal. The estimated ejection fraction was in the range of 55% to 60%. Wall motion was normal; there were no regional wall motion abnormalities. Doppler  parameters are consistent with abnormal left ventricular relaxation (grade 1 diastolic dysfunction). Acoustic contrast opacification revealed no evidence ofthrombus.  07/17/2017 CATH Conclusions: 2. Severe multivessel coronary artery disease, as detailed below. Most critical lesions are 95% ostial LAD stenosis and sequential 80% and 95% mid/distal RCA and rPDA stenoses. 3. Normal to mildly elevated left ventricular filling pressure. 4. Low normal left ventricular contraction with subtle inferior hypokinesis. LVEF 50-55%.  Recommendations: 2. Due to intermittent angina during procedure relieved with sublingual and IV nitroglycerin, patient will be admitted to stepdown. 3. Cardiac surgery consultation for CABG evaluation. His LAD disease is best suited to bypass. However, given suboptimal targets for OM2 and RCA/rPDA, a hybrid approach with LIMA-LAD and PCI to RCA/rPDA may need to be considered. Recommend Heart Team approach to optimize revascularization. 4. Aggressive secondary prevention, including high-intensity statin therapy. 5. Start heparin infusion 4 hours after sheath removal. 6. Titrate nitroglycerin gtt for relief of chest pain. 7. Obtain transthoracic echocardiogram to assess for structural abnormalities.  Patient Profile     67 y.o. male with long-standing hyperlipidemia and hypertension admitted with unstable angina and found to have severe multivessel coronary artery disease, preserved left ventricular systolic function. Underwent CABG and developed post op Afib.   Assessment & Plan    1. Unstable Angina: Now post op day 7 after CABG x4. Feeling well, and has been walking in the hallways without any issues. Planned for DC today per TCTS.  2. Post Op Afib: Was initially on IV amiodarone then transitioned to oral dosing. Had several episodes of break through Afib. Seen by Dr. Rennis GoldenHilty who increased amio to 400mg  TID with plans to taper after a week. Tele showed QTC >500  this morning. Dose has now been reduced to 400mg  BID. [facility], unless recurrent Afib noted as outpatient. Recheck EKG today. Plan for 400mg  BID x1 week and 400mg  daily thereafter. No issues with rash while he has been on amio. Follow up arranged for 7/12 with Dr. Josiah Loboevenkar.    Signed, Laverda PageLindsay Roberts, NP  07/27/2017, 11:38 AM  Pager # [phone]   For questions or updates, please contact [facility] Please consult [email or web address] for contact info under Cardiology/STEMI.

## 2017-07-27 NOTE — Progress Notes (Signed)
CARDIAC REHAB PHASE I   PRE:  Rate/Rhythm: 90 SR    BP: sitting 133/78    SaO2: 96 RA  MODE:  Ambulation: 750 ft   POST:  Rate/Rhythm: 103 ST    BP: sitting 153//84     SaO2: 96 RA  Pt is doing very well. Second walk today, no afib during walk. Feels great, independent. No questions. [id number]   Harriet Massonandi Kristan Shareka Casale CES, ACSM 07/27/2017 9:08 AM

## 2017-07-27 NOTE — Addendum Note (Signed)
Addendum  created 07/27/17 [id number] by Val EagleMoser, Justice Milliron, MD   Diagnosis association updated

## 2017-07-27 NOTE — Progress Notes (Signed)
EPW removed per protocol. Pt tolerated well. Tips intact. VSS. Pt educated on bedrest for one hour. Call light in reach. Will continue to monitor.  Versie StarksHanna  Kaeden Depaz, RN

## 2017-07-27 NOTE — Progress Notes (Addendum)
      [address]       [place] [id number]             [phone]        7 Days Post-Op Procedure(s) (LRB): CORONARY ARTERY BYPASS GRAFTING (CABG) times four using left internal mammary artery to LAD and endoscopically harvested right saphenous vein graft to distal circumflex, intermediate, and PD (N/A)  Subjective: Patient's only complaint is he wants to go home.  Objective: Vital signs in last 24 hours: Temp:  [98.1 F (36.7 C)-98.9 F (37.2 C)] 98.9 F (37.2 C) (07/01 0428) Pulse Rate:  [9-91] 91 (06/30 2015) Cardiac Rhythm: Normal sinus rhythm (07/01 0017) Resp:  [15-19] 19 (06/30 2015) BP: (116-117)/(80-82) 116/82 (06/30 2015) SpO2:  [94 %-95 %] 94 % (06/30 2015) Weight:  [203 lb 8 oz (92.3 kg)] 203 lb 8 oz (92.3 kg) (07/01 0428)  Pre op weight 99.3 kg Current Weight  07/27/17 203 lb 8 oz (92.3 kg)       Intake/Output from previous day: 06/30 0701 - 07/01 0700 In: 615 [P.O.:615] Out: -    Physical Exam:  Cardiovascular: RRR Pulmonary: Clear to auscultation bilaterally Abdomen: Soft, non tender, bowel sounds present. Extremities: No lower extremity edema. Wounds: Clean and dry.  No erythema or signs of infection.  Lab Results: CBC: Recent Labs    07/25/17 0310 07/27/17 0432  WBC 13.8* 12.8*  HGB 12.6* 12.9*  HCT 39.6 40.8  PLT 237 316   BMET:  Recent Labs    07/25/17 0310 07/27/17 0432  NA 143 139  K 4.5 4.9  CL 105 101  CO2 30 29  GLUCOSE 107* 106*  BUN 16 18  CREATININE 1.07 1.18  CALCIUM 9.2 9.3    PT/INR:  Lab Results  Component Value Date   INR 1.10 07/18/2017   ABG:  INR: Will add last result for INR, ABG once components are confirmed Will add last 4 CBG results once components are confirmed  Assessment/Plan:  1. CV - Short duration of PAF. SR as of yesterday afternoon. Per cardiology, no need for anticoagulation unless more atrial fibrillation. On Amiodarone 400 mg tid, Lopressor 50 mg bid, and  Lisinopril 5 mg daily. Of note, QTc > 500. Will decrease Amiodarone and ask cardiology for recommendations. 2.  Pulmonary - On room air. Encourage incentive spirometer 3. Volume Overload - On Lasix 40 mg daily 4.  Potassium up to 4.9-will stop supplementation and monitor 5. Will remove EPW today 6. As discussed with Dr. Tyrone SageGerhardt, will discharge later today.  Donielle M ZimmermanPA-C 07/27/2017,7:19 AM [phone]

## 2017-07-27 NOTE — Progress Notes (Signed)
D/c instructions and prescriptions given to pt. All questions answered. Wound care and sternal precautions reviewed. Telemetry removed, IV removed clean and and intact. Son to escort pt home.  Versie StarksHanna  Masud Holub, RN

## 2017-07-27 NOTE — Discharge Instructions (Signed)
Prediabetes Eating Plan Prediabetes--also called impaired glucose tolerance or impaired fasting glucose--is a condition that causes blood sugar (blood glucose) levels to be higher than normal. Following a healthy diet can help to keep prediabetes under control. It can also help to lower the risk of type 2 diabetes and heart disease, which are increased in people who have prediabetes. Along with regular exercise, a healthy diet:  Promotes weight loss.  Helps to control blood sugar levels.  Helps to improve the way that the body uses insulin.  What do I need to know about this eating plan?  Use the glycemic index (GI) to plan your meals. The index tells you how quickly a food will raise your blood sugar. Choose low-GI foods. These foods take a longer time to raise blood sugar.  Pay close attention to the amount of carbohydrates in the food that you eat. Carbohydrates increase blood sugar levels.  Keep track of how many calories you take in. Eating the right amount of calories will help you to achieve a healthy weight. Losing about 7 percent of your starting weight can help to prevent type 2 diabetes.  You may want to follow a Mediterranean diet. This diet includes a lot of vegetables, lean meats or fish, whole grains, fruits, and healthy oils and fats. What foods can I eat? Grains Whole grains, such as whole-wheat or whole-grain breads, crackers, cereals, and pasta. Unsweetened oatmeal. Bulgur. Barley. Quinoa. Brown rice. Corn or whole-wheat flour tortillas or taco shells. Vegetables Lettuce. Spinach. Peas. Beets. Cauliflower. Cabbage. Broccoli. Carrots. Tomatoes. Squash. Eggplant. Herbs. Peppers. Onions. Cucumbers. Brussels sprouts. Fruits Berries. Bananas. Apples. Oranges. Grapes. Papaya. Mango. Pomegranate. Kiwi. Grapefruit. Cherries. Meats and Other Protein Sources Seafood. Lean meats, such as chicken and [place] or lean cuts of pork and beef. Tofu. Eggs. Nuts. Beans. Dairy Low-fat or  fat-free dairy products, such as yogurt, cottage cheese, and cheese. Beverages Water. Tea. Coffee. Sugar-free or diet soda. Seltzer water. Milk. Milk alternatives, such as soy or almond milk. Condiments Mustard. Relish. Low-fat, low-sugar ketchup. Low-fat, low-sugar barbecue sauce. Low-fat or fat-free mayonnaise. Sweets and Desserts Sugar-free or low-fat pudding. Sugar-free or low-fat ice cream and other frozen treats. Fats and Oils Avocado. Walnuts. Olive oil. The items listed above may not be a complete list of recommended foods or beverages. Contact your dietitian for more options. What foods are not recommended? Grains Refined white flour and flour products, such as bread, pasta, snack foods, and cereals. Beverages Sweetened drinks, such as sweet iced tea and soda. Sweets and Desserts Baked goods, such as cake, cupcakes, pastries, cookies, and cheesecake. The items listed above may not be a complete list of foods and beverages to avoid. Contact your dietitian for more information. This information is not intended to replace advice given to you by your health care provider. Make sure you discuss any questions you have with your health care provider. Document Released: 05/30/2014 Document Revised: 06/21/2015 Document Reviewed: 02/08/2014 Elsevier Interactive Patient Education  2017 Elsevier Inc. Endoscopic Saphenous Vein Harvesting, Care After Refer to this sheet in the next few weeks. These instructions provide you with information about caring for yourself after your procedure. Your health care provider may also give you more specific instructions. Your treatment has been planned according to current medical practices, but problems sometimes occur. Call your health care provider if you have any problems or questions after your procedure. What can I expect after the procedure? After the procedure, it is common to have:  Pain.  Bruising.  Swelling.  Numbness.  Follow these  instructions at home: Medicine  Take over-the-counter and prescription medicines only as told by your health care provider.  Do not drive or operate heavy machinery while taking prescription pain medicine. Incision care   Follow instructions from your health care provider about how to take care of the cut made during surgery (incision). Make sure you: ? Wash your hands with soap and water before you change your bandage (dressing). If soap and water are not available, use hand sanitizer. ? Change your dressing as told by your health care provider. ? Leave stitches (sutures), skin glue, or adhesive strips in place. These skin closures may need to be in place for 2 weeks or longer. If adhesive strip edges start to loosen and curl up, you may trim the loose edges. Do not remove adhesive strips completely unless your health care provider tells you to do that.  Check your incision area every day for signs of infection. Check for: ? More redness, swelling, or pain. ? More fluid or blood. ? Warmth. ? Pus or a bad smell. General instructions  Raise (elevate) your legs above the level of your heart while you are sitting or lying down.  Do any exercises your health care providers have given you. These may include deep breathing, coughing, and walking exercises.  Do not shower, take baths, swim, or use a hot tub unless told by your health care provider.  Wear your elastic stocking if told by your health care provider.  Keep all follow-up visits as told by your health care provider. This is important. Contact a health care provider if:  Medicine does not help your pain.  Your pain gets worse.  You have new leg bruises or your leg bruises get bigger.  You have a fever.  Your leg feels numb.  You have more redness, swelling, or pain around your incision.  You have more fluid or blood coming from your incision.  Your incision feels warm to the touch.  You have pus or a bad smell  coming from your incision. Get help right away if:  Your pain is severe.  You develop pain, tenderness, warmth, redness, or swelling in any part of your leg.  You have chest pain.  You have trouble breathing. This information is not intended to replace advice given to you by your health care provider. Make sure you discuss any questions you have with your health care provider. Document Released: 09/25/2010 Document Revised: 06/21/2015 Document Reviewed: 11/27/2014 Elsevier Interactive Patient Education  2018 Elsevier Inc. Coronary Artery Bypass Grafting, Care After These instructions give you information on caring for yourself after your procedure. Your doctor may also give you more specific instructions. Call your doctor if you have any problems or questions after your procedure. Follow these instructions at home:  Only take medicine as told by your doctor. Take medicines exactly as told. Do not stop taking medicines or start any new medicines without talking to your doctor first.  Take your pulse as told by your doctor.  Do deep breathing as told by your doctor. Use your breathing device (incentive spirometer), if given, to practice deep breathing several times a day. Support your chest with a pillow or your arms when you take deep breaths or cough.  Keep the area clean, dry, and protected where the surgery cuts (incisions) were made. Remove bandages (dressings) only as told by your doctor. If strips were applied to surgical area, do not take them off. They  fall off on their own.  Check the surgery area daily for puffiness (swelling), redness, or leaking fluid.  If surgery cuts were made in your legs: ? Avoid crossing your legs. ? Avoid sitting for long periods of time. Change positions every 30 minutes. ? Raise your legs when you are sitting. Place them on pillows.  Wear stockings that help keep blood clots from forming in your legs (compression stockings).  Only take sponge  baths until your doctor says it is okay to take showers. Pat the surgery area dry. Do not rub the surgery area with a washcloth or towel. Do not bathe, swim, or use a hot tub until your doctor says it is okay.  Eat foods that are high in fiber. These include raw fruits and vegetables, whole grains, beans, and nuts. Choose lean meats. Avoid canned, processed, and fried foods.  Drink enough fluids to keep your pee (urine) clear or pale yellow.  Weigh yourself every day.  Rest and limit activity as told by your doctor. You may be told to: ? Stop any activity if you have chest pain, shortness of breath, changes in heartbeat, or dizziness. Get help right away if this happens. ? Move around often for short amounts of time or take short walks as told by your doctor. Gradually become more active. You may need help to strengthen your muscles and build endurance. ? Avoid lifting, pushing, or pulling anything heavier than 10 pounds (4.5 kg) for at least 6 weeks after surgery.  Do not drive until your doctor says it is okay.  Ask your doctor when you can go back to work.  Ask your doctor when you can begin sexual activity again.  Follow up with your doctor as told. Contact a doctor if:  You have puffiness, redness, more pain, or fluid draining from the incision site.  You have a fever.  You have puffiness in your ankles or legs.  You have pain in your legs.  You gain 2 or more pounds (0.9 kg) a day.  You feel sick to your stomach (nauseous) or throw up (vomit).  You have watery poop (diarrhea). Get help right away if:  You have chest pain that goes to your jaw or arms.  You have shortness of breath.  You have a fast or irregular heartbeat.  You notice a "clicking" in your breastbone when you move.  You have numbness or weakness in your arms or legs.  You feel dizzy or light-headed. This information is not intended to replace advice given to you by your health care provider. Make  sure you discuss any questions you have with your health care provider. Document Released: 01/18/2013 Document Revised: 06/21/2015 Document Reviewed: 06/22/2012 Elsevier Interactive Patient Education  2017 [employer]

## 2017-08-05 ENCOUNTER — Telehealth: Payer: Self-pay | Admitting: Family Medicine

## 2017-08-05 NOTE — Telephone Encounter (Signed)
Form completed, placed on provider's desk for signature.

## 2017-08-05 NOTE — Telephone Encounter (Signed)
Patient needs a handicap placard and would like Dr Dossie Arbourrissman to fill form out and they will pick it up.  Thank you

## 2017-08-06 NOTE — Telephone Encounter (Signed)
Patients wife picked up handicap form

## 2017-08-07 ENCOUNTER — Ambulatory Visit: Payer: Medicare Other | Admitting: Cardiology

## 2017-08-07 ENCOUNTER — Other Ambulatory Visit: Payer: Self-pay | Admitting: Cardiology

## 2017-08-07 ENCOUNTER — Encounter: Payer: Self-pay | Admitting: Cardiology

## 2017-08-07 VITALS — BP 114/62 | HR 56 | Ht 70.0 in | Wt 200.1 lb

## 2017-08-07 DIAGNOSIS — I480 Paroxysmal atrial fibrillation: Secondary | ICD-10-CM | POA: Diagnosis not present

## 2017-08-07 DIAGNOSIS — I2510 Atherosclerotic heart disease of native coronary artery without angina pectoris: Secondary | ICD-10-CM | POA: Diagnosis not present

## 2017-08-07 DIAGNOSIS — Z951 Presence of aortocoronary bypass graft: Secondary | ICD-10-CM | POA: Diagnosis not present

## 2017-08-07 DIAGNOSIS — I10 Essential (primary) hypertension: Secondary | ICD-10-CM

## 2017-08-07 DIAGNOSIS — E782 Mixed hyperlipidemia: Secondary | ICD-10-CM | POA: Diagnosis not present

## 2017-08-07 HISTORY — DX: Paroxysmal atrial fibrillation: I48.0

## 2017-08-07 HISTORY — DX: Atherosclerotic heart disease of native coronary artery without angina pectoris: I25.10

## 2017-08-07 NOTE — Patient Instructions (Signed)
Medication Instructions:  Your physician recommends that you continue on your current medications as directed. Please refer to the Current Medication list given to you today.   Labwork: Your physician recommends that you return for lab work today: CBC, BMP.   Testing/Procedures: None  Follow-Up: Your physician recommends that you schedule a follow-up appointment in: 1 month.   If you need a refill on your cardiac medications before your next appointment, please call your pharmacy.   Thank you for choosing [facility]! Mady Gemmaatherine Netha Dafoe, RN [phone]

## 2017-08-07 NOTE — Progress Notes (Signed)
Cardiology Office Note:    Date:  08/07/2017   ID:  Thomas Miller, DOB [birth year], MRN [id number]  PCP:  Steele Sizer, MD  Cardiologist:  Garwin Brothers, MD   Referring MD: Steele Sizer, MD    ASSESSMENT:    1. Essential hypertension   2. Mixed hyperlipidemia   3. S/P CABG x 4   4. PAF (paroxysmal atrial fibrillation) (HCC)   5. Coronary artery disease involving native coronary artery of native heart without angina pectoris    PLAN:    In order of problems listed above:  1. Secondary prevention stressed with the patient.  Importance of compliance with diet and medications stressed and he vocalized understanding.  His effort tolerance is good now and is walking on a regular basis. 2. We will do basic blood work today. 3. The wounds of his graft harvest site and surgical sites are healing.  He has an appointment with his surgeon in the next 2 weeks or so. 4. He will continue amiodarone therapy and hopefully our surgical colleagues would drop the dose when they see him in the next couple of weeks.  Eventually we will wean his amiodarone off as his usual. 5. He will be seen in follow-up appointment in a month or earlier if he has any concerns.  At that time we will do fasting blood work.   Medication Adjustments/Labs and Tests Ordered: Current medicines are reviewed at length with the patient today.  Concerns regarding medicines are outlined above.  No orders of the defined types were placed in this encounter.  No orders of the defined types were placed in this encounter.    Chief Complaint  Patient presents with  . Follow up on CABG     History of Present Illness:    Thomas Miller is a 67 y.o. male.  The patient was evaluated by me for symptoms suggesting of significant angina.  Patient was referred for a quick coronary angiography and this revealed critical three-vessel coronary artery disease for which she underwent CABG surgery.  Subsequently is done fine.  Now  he tells me that he walks 3 to 4 months without any problems.  No chest pain orthopnea or PND.  At the time of my evaluation, the patient is alert awake oriented and in no distress. Patient mentions to me that he went into atrial fibrillation in the postoperative period.  Therefore he has been prescribed amiodarone therapy.  Past Medical History:  Diagnosis Date  . Gout   . Hearing loss   . Hypertension   . Reflux     Past Surgical History:  Procedure Laterality Date  . CORONARY ARTERY BYPASS GRAFT N/A 07/20/2017   Procedure: CORONARY ARTERY BYPASS GRAFTING (CABG) times four using left internal mammary artery to LAD and endoscopically harvested right saphenous vein graft to distal circumflex, intermediate, and PD;  Surgeon: [name], MD;  Location: [facility] OR;  Service: Open Heart Surgery;  Laterality: N/A;  . EYE SURGERY Bilateral 2013  . FOOT SURGERY Bilateral    ingrown toenails  . GALLBLADDER SURGERY    . LEFT HEART CATH AND CORONARY ANGIOGRAPHY N/A 07/17/2017   Procedure: LEFT HEART CATH AND CORONARY ANGIOGRAPHY;  Surgeon: Yvonne Kendall, MD;  Location: [facility] IN[facility];  Service: Cardiovascular;  Laterality: N/A;  . SHOULDER SURGERY Right   . SPINE SURGERY      Current Medications: Current Meds  Medication Sig  . albuterol (PROVENTIL HFA;VENTOLIN HFA) 108 (90 Base)  MCG/ACT inhaler Inhale 2 puffs into the lungs every 6 (six) hours as needed for wheezing or shortness of breath.  . allopurinol (ZYLOPRIM) 300 MG tablet Take 1 tablet (300 mg total) by mouth daily.  [name] amiodarone (PACERONE) 400 MG tablet Take 1 tablet (400 mg total) by mouth 2 (two) times daily. For one week then take 400 mg daily thereafter.  [name] aspirin EC 325 MG EC tablet Take 1 tablet (325 mg total) by mouth daily.  [name] atorvastatin (LIPITOR) 80 MG tablet Take 1 tablet (80 mg total) by mouth daily at 6 PM.  . esomeprazole (NEXIUM) 40 MG capsule Take 1 capsule (40 mg total) by mouth daily before breakfast.  .  fluticasone (FLONASE) 50 MCG/ACT nasal spray Place 2 sprays into both nostrils daily.  [name] lisinopril (PRINIVIL,ZESTRIL) 5 MG tablet Take 1 tablet (5 mg total) by mouth daily.  . metoprolol tartrate (LOPRESSOR) 50 MG tablet Take 1 tablet (50 mg total) by mouth 2 (two) times daily.  . Multiple Vitamin (MULTIVITAMIN) tablet Take 1 tablet by mouth daily.     Allergies:   Codeine; Tape; and Naprosyn [naproxen]   Social History   Socioeconomic History  . Marital status: Married    Spouse name: Not on file  . Number of children: Not on file  . Years of education: Not on file  . Highest education level: Not on file  Occupational History  . Not on file  Social Needs  . Financial resource strain: Not on file  . Food insecurity:    Worry: Not on file    Inability: Not on file  . Transportation needs:    Medical: Not on file    Non-medical: Not on file  Tobacco Use  . Smoking status: Never Smoker  . Smokeless tobacco: Never Used  Substance and Sexual Activity  . Alcohol use: Yes    Comment: rarely  . Drug use: No  . Sexual activity: Not on file  Lifestyle  . Physical activity:    Days per week: Not on file    Minutes per session: Not on file  . Stress: Not on file  Relationships  . Social connections:    Talks on phone: Not on file    Gets together: Not on file    Attends religious service: Not on file    Active member of club or organization: Not on file    Attends meetings of clubs or organizations: Not on file    Relationship status: Not on file  Other Topics Concern  . Not on file  Social History Narrative  . Not on file     Family History: The patient's family history includes Diabetes in his mother; Lung cancer in his father; Stroke in his paternal grandfather.  ROS:   Please see the history of present illness.    All other systems reviewed and are negative.  EKGs/Labs/Other Studies Reviewed:    The following studies were reviewed today: Lab work, coronary  angiography report and the reports of the bypass surgery and hospitalization were discussed with the patient at extensive length.   Recent Labs: 07/18/2017: ALT 33 07/21/2017: Magnesium 2.5 07/23/2017: TSH 2.079 07/27/2017: BUN 18; Creatinine, Ser 1.18; Hemoglobin 12.9; Platelets 316; Potassium 4.9; Sodium 139  Recent Lipid Panel    Component Value Date/Time   CHOL 146 07/18/2017 0249   CHOL 142 06/03/2017 0811   TRIG 51 07/18/2017 0249   TRIG 123 06/03/2017 0811   HDL 48 07/18/2017 0249   HDL  46 11/27/2016 1425   CHOLHDL 3.0 07/18/2017 0249   VLDL 10 07/18/2017 0249   VLDL 25 06/03/2017 0811   LDLCALC 88 07/18/2017 0249   LDLCALC 80 11/27/2016 1425    Physical Exam:    VS:  BP 114/62   Pulse (!) 56   Ht 5\' 10"  (1.778 m)   Wt 200 lb 1.9 oz (90.8 kg)   SpO2 98%   BMI 28.71 kg/m     Wt Readings from Last 3 Encounters:  08/07/17 200 lb 1.9 oz (90.8 kg)  07/27/17 203 lb 8 oz (92.3 kg)  07/16/17 216 lb 6.4 oz (98.2 kg)     GEN: Patient is in no acute distress HEENT: Normal NECK: No JVD; No carotid bruits LYMPHATICS: No lymphadenopathy CARDIAC: Hear sounds regular, 2/6 systolic murmur at the apex. RESPIRATORY:  Clear to auscultation without rales, wheezing or rhonchi  ABDOMEN: Soft, non-tender, non-distended MUSCULOSKELETAL:  No edema; No deformity  SKIN: Warm and dry NEUROLOGIC:  Alert and oriented x 3 PSYCHIATRIC:  Normal affect   Signed, Garwin Brothersajan R Rylen Swindler, MD  08/07/2017 10:51 AM    [facility]

## 2017-08-08 LAB — CBC
Hematocrit: 40.8 % (ref 37.5–51.0)
Hemoglobin: 13.4 g/dL (ref 13.0–17.7)
MCH: 28.8 pg (ref 26.6–33.0)
MCHC: 32.8 g/dL (ref 31.5–35.7)
MCV: 88 fL (ref 79–97)
Platelets: 479 10*3/uL — ABNORMAL HIGH (ref 150–450)
RBC: 4.66 x10E6/uL (ref 4.14–5.80)
RDW: 14.5 % (ref 12.3–15.4)
WBC: 9.8 10*3/uL (ref 3.4–10.8)

## 2017-08-08 LAB — BASIC METABOLIC PANEL
BUN/Creatinine Ratio: 12 (ref 10–24)
BUN: 17 mg/dL (ref 8–27)
CO2: 24 mmol/L (ref 20–29)
Calcium: 10.1 mg/dL (ref 8.6–10.2)
Chloride: 102 mmol/L (ref 96–106)
Creatinine, Ser: 1.39 mg/dL — ABNORMAL HIGH (ref 0.76–1.27)
GFR calc Af Amer: 60 mL/min/{1.73_m2} (ref >59)
GFR calc non Af Amer: 52 mL/min/{1.73_m2} — ABNORMAL LOW (ref >59)
Glucose: 95 mg/dL (ref 65–99)
Potassium: 5.5 mmol/L — ABNORMAL HIGH (ref 3.5–5.2)
Sodium: 141 mmol/L (ref 134–144)

## 2017-08-14 ENCOUNTER — Telehealth: Payer: Self-pay | Admitting: Family Medicine

## 2017-08-14 ENCOUNTER — Encounter: Payer: Self-pay | Admitting: *Deleted

## 2017-08-14 NOTE — Telephone Encounter (Signed)
Copied from CRM [id number]. Topic: Quick Communication - See Telephone Encounter >> Aug 14, 2017  9:44 AM Luanna Coleawoud, Jessica L wrote: CRM for notification. See Telephone encounter for: 08/14/17. Samara [facility]istKathryn [facility] called and stated that pt labs showed elevated platelets and elevated potassium. CB#[phone] labs results faxed over

## 2017-08-17 ENCOUNTER — Encounter: Payer: Self-pay | Admitting: Family Medicine

## 2017-08-17 ENCOUNTER — Ambulatory Visit (INDEPENDENT_AMBULATORY_CARE_PROVIDER_SITE_OTHER): Payer: Medicare Other | Admitting: Family Medicine

## 2017-08-17 DIAGNOSIS — R5383 Other fatigue: Secondary | ICD-10-CM | POA: Diagnosis not present

## 2017-08-17 DIAGNOSIS — I2510 Atherosclerotic heart disease of native coronary artery without angina pectoris: Secondary | ICD-10-CM

## 2017-08-17 DIAGNOSIS — I10 Essential (primary) hypertension: Secondary | ICD-10-CM | POA: Diagnosis not present

## 2017-08-17 DIAGNOSIS — Z951 Presence of aortocoronary bypass graft: Secondary | ICD-10-CM | POA: Diagnosis not present

## 2017-08-17 NOTE — Assessment & Plan Note (Signed)
Doing well after bypass surgery and recovering well

## 2017-08-17 NOTE — Progress Notes (Signed)
BP 138/86   Pulse 73   Ht 5\' 9"  (1.753 m)   Wt 200 lb (90.7 kg)   SpO2 97%   BMI 29.53 kg/m    Subjective:    Patient ID: Thomas Miller, male    DOB: [birth year], 67 y.o.   MRN: [id number]  HPI: Thomas KavaLarry G Norberto is a 67 y.o. male  Chief Complaint  Patient presents with  . Hospitalization Follow-up    Quadruple bipass at [facility] x 1 month ago  Patient all in all doing well has lost 20+ pounds since hospitalization or during hospitalization.  No chest pain chest tightness is able to exercise and walk both morning and evening and ride exercise bike during the day.  Chest is healing well with no secondary infection and only some tenderness where the hair is growing back.  Relevant past medical, surgical, family and social history reviewed and updated as indicated. Interim medical history since our last visit reviewed. Allergies and medications reviewed and updated.  Review of Systems  Constitutional: Negative.   Respiratory: Negative.   Cardiovascular: Negative.     Per HPI unless specifically indicated above     Objective:    BP 138/86   Pulse 73   Ht 5\' 9"  (1.753 m)   Wt 200 lb (90.7 kg)   SpO2 97%   BMI 29.53 kg/m   Wt Readings from Last 3 Encounters:  08/17/17 200 lb (90.7 kg)  08/07/17 200 lb 1.9 oz (90.8 kg)  07/27/17 203 lb 8 oz (92.3 kg)    Physical Exam  Constitutional: He is oriented to person, place, and time. He appears well-developed and well-nourished.  HENT:  Head: Normocephalic and atraumatic.  Eyes: Conjunctivae and EOM are normal.  Neck: Normal range of motion.  Cardiovascular: Normal rate, regular rhythm and normal heart sounds.  Pulmonary/Chest: Effort normal and breath sounds normal.  Musculoskeletal: Normal range of motion.  Neurological: He is alert and oriented to person, place, and time.  Skin: No erythema.  Psychiatric: He has a normal mood and affect. His behavior is normal. Judgment and thought content normal.    Results for orders  placed or performed in visit on 08/07/17  CBC  Result Value Ref Range   WBC 9.8 3.4 - 10.8 x10E3/uL   RBC 4.66 4.14 - 5.80 x10E6/uL   Hemoglobin 13.4 13.0 - 17.7 g/dL   Hematocrit 40.940.8 81.137.5 - 51.0 %   MCV 88 79 - 97 fL   MCH 28.8 26.6 - 33.0 pg   MCHC 32.8 31.5 - 35.7 g/dL   RDW 91.414.5 78.212.3 - 95.615.4 %   Platelets 479 (H) 150 - 450 x10E3/uL  Basic metabolic panel  Result Value Ref Range   Glucose 95 65 - 99 mg/dL   BUN 17 8 - 27 mg/dL   Creatinine, Ser 2.131.39 (H) 0.76 - 1.27 mg/dL   GFR calc non Af Amer 52 (L) >59 mL/min/1.73   GFR calc Af Amer 60 >59 mL/min/1.73   BUN/Creatinine Ratio 12 10 - 24   Sodium 141 134 - 144 mmol/L   Potassium 5.5 (H) 3.5 - 5.2 mmol/L   Chloride 102 96 - 106 mmol/L   CO2 24 20 - 29 mmol/L   Calcium 10.1 8.6 - 10.2 mg/dL      Assessment & Plan:   Problem List Items Addressed This Visit      Cardiovascular and Mediastinum   Hypertension    The current medical regimen is effective;  continue present plan  and medications.       Relevant Orders   Basic metabolic panel   CBC with Differential/Platelet   CAD (coronary artery disease)    Doing well after bypass surgery and recovering well        Other   Fatigue    Because of elevated potassium and elevated platelet count will recheck BMP and CBC today      Relevant Orders   Basic metabolic panel   CBC with Differential/Platelet   S/P CABG x 4    The current medical regimen is effective;  continue present plan and medications.       Relevant Orders   Basic metabolic panel   CBC with Differential/Platelet       Follow up plan: Return in about 3 months (around 11/17/2017) for Physical Exam.

## 2017-08-17 NOTE — Assessment & Plan Note (Signed)
The current medical regimen is effective;  continue present plan and medications.  

## 2017-08-17 NOTE — Assessment & Plan Note (Signed)
Because of elevated potassium and elevated platelet count will recheck BMP and CBC today

## 2017-08-18 ENCOUNTER — Encounter: Payer: Self-pay | Admitting: Family Medicine

## 2017-08-18 LAB — CBC WITH DIFFERENTIAL/PLATELET
Basophils Absolute: 0.1 10*3/uL (ref 0.0–0.2)
Basos: 1 %
EOS (ABSOLUTE): 0.6 10*3/uL — ABNORMAL HIGH (ref 0.0–0.4)
Eos: 9 %
Hematocrit: 43.3 % (ref 37.5–51.0)
Hemoglobin: 14.3 g/dL (ref 13.0–17.7)
Immature Grans (Abs): 0 10*3/uL (ref 0.0–0.1)
Immature Granulocytes: 0 %
Lymphocytes Absolute: 1.5 10*3/uL (ref 0.7–3.1)
Lymphs: 21 %
MCH: 28.8 pg (ref 26.6–33.0)
MCHC: 33 g/dL (ref 31.5–35.7)
MCV: 87 fL (ref 79–97)
Monocytes Absolute: 0.5 10*3/uL (ref 0.1–0.9)
Monocytes: 8 %
Neutrophils Absolute: 4.3 10*3/uL (ref 1.4–7.0)
Neutrophils: 61 %
Platelets: 240 10*3/uL (ref 150–450)
RBC: 4.96 x10E6/uL (ref 4.14–5.80)
RDW: 14.6 % (ref 12.3–15.4)
WBC: 7 10*3/uL (ref 3.4–10.8)

## 2017-08-18 LAB — BASIC METABOLIC PANEL
BUN/Creatinine Ratio: 11 (ref 10–24)
BUN: 14 mg/dL (ref 8–27)
CO2: 24 mmol/L (ref 20–29)
Calcium: 9.9 mg/dL (ref 8.6–10.2)
Chloride: 102 mmol/L (ref 96–106)
Creatinine, Ser: 1.25 mg/dL (ref 0.76–1.27)
GFR calc Af Amer: 68 mL/min/{1.73_m2} (ref >59)
GFR calc non Af Amer: 59 mL/min/{1.73_m2} — ABNORMAL LOW (ref >59)
Glucose: 95 mg/dL (ref 65–99)
Potassium: 4.9 mmol/L (ref 3.5–5.2)
Sodium: 141 mmol/L (ref 134–144)

## 2017-08-24 ENCOUNTER — Ambulatory Visit: Payer: Medicare Other | Admitting: Cardiology

## 2017-08-27 ENCOUNTER — Ambulatory Visit: Payer: Medicare Other | Admitting: Cardiothoracic Surgery

## 2017-08-31 ENCOUNTER — Ambulatory Visit
Admission: RE | Admit: 2017-08-31 | Discharge: 2017-08-31 | Disposition: A | Discharge status: Home / Self Care | Payer: Medicare Other | Source: Ambulatory Visit | Attending: Cardiothoracic Surgery | Admitting: Cardiothoracic Surgery

## 2017-08-31 ENCOUNTER — Other Ambulatory Visit: Payer: Self-pay | Admitting: Cardiothoracic Surgery

## 2017-08-31 ENCOUNTER — Ambulatory Visit (INDEPENDENT_AMBULATORY_CARE_PROVIDER_SITE_OTHER): Payer: Self-pay | Admitting: Physician Assistant

## 2017-08-31 VITALS — BP 126/76 | HR 51 | Resp 20 | Ht 70.0 in | Wt 206.0 lb

## 2017-08-31 DIAGNOSIS — I2510 Atherosclerotic heart disease of native coronary artery without angina pectoris: Secondary | ICD-10-CM

## 2017-08-31 DIAGNOSIS — Z951 Presence of aortocoronary bypass graft: Secondary | ICD-10-CM

## 2017-08-31 DIAGNOSIS — R0602 Shortness of breath: Secondary | ICD-10-CM

## 2017-08-31 DIAGNOSIS — J90 Pleural effusion, not elsewhere classified: Secondary | ICD-10-CM | POA: Diagnosis not present

## 2017-08-31 DIAGNOSIS — R0789 Other chest pain: Secondary | ICD-10-CM

## 2017-08-31 MED ORDER — LOSARTAN POTASSIUM 25 MG PO TABS: 25.0000 mg | ORAL_TABLET | Freq: Every day | ORAL | 3 refills | Status: DC

## 2017-08-31 MED ORDER — METOPROLOL TARTRATE 25 MG PO TABS: 25.0000 mg | ORAL_TABLET | Freq: Two times a day (BID) | ORAL | 3 refills | Status: DC

## 2017-08-31 NOTE — Patient Instructions (Signed)

## 2017-08-31 NOTE — Progress Notes (Signed)
HPI:  Patient returns for routine postoperative follow-up having undergone CABG x 4 on 07/21/2017.  The patient's early postoperative recovery while in the hospital was notable for Atrial Fibrillation which converted to NSR with Amiodarone.  Since hospital discharge the patient reports he is doing very well.  He is anxious to be able to play golf again. He is ambulating 3 miles per day.  He does have a new onset cough which is dry and nagging since he left the hospital.  He also notes that he has some times where he feels like he has no energy and his heart rate is in the 40s.     Current Outpatient Medications  Medication Sig Dispense Refill  . allopurinol (ZYLOPRIM) 300 MG tablet Take 1 tablet (300 mg total) by mouth daily. 90 tablet 4  . amiodarone (PACERONE) 400 MG tablet Take 200 mg by mouth daily.    [name]. aspirin EC 325 MG EC tablet Take 1 tablet (325 mg total) by mouth daily. 30 tablet 0  . atorvastatin (LIPITOR) 80 MG tablet Take 1 tablet (80 mg total) by mouth daily at 6 PM. 30 tablet 1  . esomeprazole (NEXIUM) 40 MG capsule Take 1 capsule (40 mg total) by mouth daily before breakfast. 90 capsule 4  . fluticasone (FLONASE) 50 MCG/ACT nasal spray Place 2 sprays into both nostrils daily.    [name]. losartan (COZAAR) 25 MG tablet Take 1 tablet (25 mg total) by mouth daily. 30 tablet 3  . metoprolol tartrate (LOPRESSOR) 25 MG tablet Take 1 tablet (25 mg total) by mouth 2 (two) times daily. 60 tablet 3  . Multiple Vitamin (MULTIVITAMIN) tablet Take 1 tablet by mouth daily.     No current facility-administered medications for this visit.     Physical Exam:  BP 126/76   Pulse (!) 51   Resp 20   Ht 5\' 10"  (1.778 m)   Wt 206 lb (93.4 kg)   SpO2 96% Comment: RA  BMI 29.56 kg/m   Gen: no apparent distress Heart: RRR Lungs: CTA bilaterally Abd: soft non-tender, non-distended Ext: no edema Incisions: well healed  Diagnostic Tests:  CXR: no significant pleural effusion, no  pneumothorax  A/P;  1. S/P CABG x 4 doing very well.... He is maintaining NSR, however his HR is low in the 40s at times during which the patient feels fatigued--- will decrease Amiodarone to 200 mg daily and decrease Lopressor to 25 mg BID 2. HTN- on Lisinopril, but has developed dry nagging cough, will d/c and start low dose Cozaar.  Patient was instructed to wait 3 days prior to starting this medication 3. Activity- increase as tolerated.  Patient given instructions in regards to lifting restrictions.  He was told he may not play golf with full swing until September, however he can putt and chip as this time.  If he feels any discomfort in his sternum he should scale back on his level of activity.  He was instructed he may resume driving 4. Dispo- patient doing very well, medications adjusted, and further medication adjustments can be performed at his Cardiologist office, RTC prn  Lowella DandyErin Lurlean Kernen, PA-C [facility] [phone]

## 2017-09-14 ENCOUNTER — Encounter: Payer: Self-pay | Admitting: Cardiology

## 2017-09-14 ENCOUNTER — Ambulatory Visit: Payer: Medicare Other | Admitting: Cardiology

## 2017-09-14 VITALS — BP 140/74 | HR 58 | Wt 209.5 lb

## 2017-09-14 DIAGNOSIS — I10 Essential (primary) hypertension: Secondary | ICD-10-CM | POA: Diagnosis not present

## 2017-09-14 DIAGNOSIS — E782 Mixed hyperlipidemia: Secondary | ICD-10-CM

## 2017-09-14 DIAGNOSIS — I2510 Atherosclerotic heart disease of native coronary artery without angina pectoris: Secondary | ICD-10-CM

## 2017-09-14 DIAGNOSIS — Z951 Presence of aortocoronary bypass graft: Secondary | ICD-10-CM | POA: Diagnosis not present

## 2017-09-14 MED ORDER — AMIODARONE HCL 100 MG PO TABS: 100.0000 mg | ORAL_TABLET | Freq: Every day | ORAL | Status: DC

## 2017-09-14 MED ORDER — ASPIRIN EC 81 MG PO TBEC: 162.0000 mg | DELAYED_RELEASE_TABLET | Freq: Every day | ORAL | 3 refills | Status: AC

## 2017-09-14 MED ORDER — NITROGLYCERIN 0.4 MG SL SUBL: 0.4000 mg | SUBLINGUAL_TABLET | SUBLINGUAL | 11 refills | Status: DC | PRN

## 2017-09-14 NOTE — Patient Instructions (Signed)
Medication Instructions:  Your physician has recommended you make the following change in your medication:   DECREASE amiodarone 100 mg daily for 2 weeks then discontinue this medication completely DECREASE aspirin 162 mg daily   START nitroglycerin as needed for chest pain: When having chest pain, stop what you are doing and sit down. Take 1 nitro, wait 5 minutes. Still having chest pain, take 1 nitro, wait 5 minutes. Still having chest pain, take 1 nitro, dial 911. Total of 3 nitro in 15 minutes.   Labwork: None  Testing/Procedures: None  Follow-Up: Your physician wants you to follow-up in: 6 months. You will receive a reminder letter in the mail two months in advance. If you don't receive a letter, please call our office to schedule the follow-up appointment.   If you need a refill on your cardiac medications before your next appointment, please call your pharmacy.   Thank you for choosing [facility]! Mady GemmaCatherine Lockhart, RN [phone]   Nitroglycerin sublingual tablets What is this medicine? NITROGLYCERIN (nye troe GLI ser in) is a type of vasodilator. It relaxes blood vessels, increasing the blood and oxygen supply to your heart. This medicine is used to relieve chest pain caused by angina. It is also used to prevent chest pain before activities like climbing stairs, going outdoors in cold weather, or sexual activity. This medicine may be used for other purposes; ask your health care provider or pharmacist if you have questions. COMMON BRAND NAME(S): Nitroquick, Nitrostat, Nitrotab What should I tell my health care provider before I take this medicine? They need to know if you have any of these conditions: -anemia -head injury, recent stroke, or bleeding in the brain -liver disease -previous heart attack -an unusual or allergic reaction to nitroglycerin, other medicines, foods, dyes, or preservatives -pregnant or trying to get pregnant -breast-feeding How should I  use this medicine? Take this medicine by mouth as needed. At the first sign of an angina attack (chest pain or tightness) place one tablet under your tongue. You can also take this medicine 5 to 10 minutes before an event likely to produce chest pain. Follow the directions on the prescription label. Let the tablet dissolve under the tongue. Do not swallow whole. Replace the dose if you accidentally swallow it. It will help if your mouth is not dry. Saliva around the tablet will help it to dissolve more quickly. Do not eat or drink, smoke or chew tobacco while a tablet is dissolving. If you are not better within 5 minutes after taking ONE dose of nitroglycerin, call 9-1-1 immediately to seek emergency medical care. Do not take more than 3 nitroglycerin tablets over 15 minutes. If you take this medicine often to relieve symptoms of angina, your doctor or health care professional may provide you with different instructions to manage your symptoms. If symptoms do not go away after following these instructions, it is important to call 9-1-1 immediately. Do not take more than 3 nitroglycerin tablets over 15 minutes. Talk to your pediatrician regarding the use of this medicine in children. Special care may be needed. Overdosage: If you think you have taken too much of this medicine contact a poison control center or emergency room at once. NOTE: This medicine is only for you. Do not share this medicine with others. What if I miss a dose? This does not apply. This medicine is only used as needed. What may interact with this medicine? Do not take this medicine with any of the following medications: -certain  migraine medicines like ergotamine and dihydroergotamine (DHE) -medicines used to treat erectile dysfunction like sildenafil, tadalafil, and vardenafil -riociguat This medicine may also interact with the following medications: -alteplase -aspirin -heparin -medicines for high blood pressure -medicines for  mental depression -other medicines used to treat angina -phenothiazines like chlorpromazine, mesoridazine, prochlorperazine, thioridazine This list may not describe all possible interactions. Give your health care provider a list of all the medicines, herbs, non-prescription drugs, or dietary supplements you use. Also tell them if you smoke, drink alcohol, or use illegal drugs. Some items may interact with your medicine. What should I watch for while using this medicine? Tell your doctor or health care professional if you feel your medicine is no longer working. Keep this medicine with you at all times. Sit or lie down when you take your medicine to prevent falling if you feel dizzy or faint after using it. Try to remain calm. This will help you to feel better faster. If you feel dizzy, take several deep breaths and lie down with your feet propped up, or bend forward with your head resting between your knees. You may get drowsy or dizzy. Do not drive, use machinery, or do anything that needs mental alertness until you know how this drug affects you. Do not stand or sit up quickly, especially if you are an older patient. This reduces the risk of dizzy or fainting spells. Alcohol can make you more drowsy and dizzy. Avoid alcoholic drinks. Do not treat yourself for coughs, colds, or pain while you are taking this medicine without asking your doctor or health care professional for advice. Some ingredients may increase your blood pressure. What side effects may I notice from receiving this medicine? Side effects that you should report to your doctor or health care professional as soon as possible: -blurred vision -dry mouth -skin rash -sweating -the feeling of extreme pressure in the head -unusually weak or tired Side effects that usually do not require medical attention (report to your doctor or health care professional if they continue or are bothersome): -flushing of the face or  neck -headache -irregular heartbeat, palpitations -nausea, vomiting This list may not describe all possible side effects. Call your doctor for medical advice about side effects. You may report side effects to FDA at 1-800-FDA-1088. Where should I keep my medicine? Keep out of the reach of children. Store at room temperature between 20 and 25 degrees C (68 and 77 degrees F). Store in Retail buyeroriginal container. Protect from light and moisture. Keep tightly closed. Throw away any unused medicine after the expiration date. NOTE: This sheet is a summary. It may not cover all possible information. If you have questions about this medicine, talk to your doctor, pharmacist, or health care provider.  2018 Elsevier/Gold Standard (2012-11-11 17:57:36)

## 2017-09-14 NOTE — Addendum Note (Signed)
Addended by: Crist FatLOCKHART, CATHERINE P on: 09/14/2017 11:13 AM   Modules accepted: Orders

## 2017-09-14 NOTE — Progress Notes (Signed)
Cardiology Office Note:    Date:  09/14/2017   ID:  Thomas KavaLarry G Mcgrail, DOB Jul 05, 1950, MRN [id number]  PCP:  Steele Sizerrissman, Mark A, MD  Cardiologist:  Garwin Brothersajan R Hendrixx Severin, MD   Referring MD: Steele Sizerrissman, Mark A, MD    ASSESSMENT:    1. Coronary artery disease involving native coronary artery of native heart without angina pectoris   2. Essential hypertension   3. Mixed hyperlipidemia   4. S/P CABG x 4    PLAN:    In order of problems listed above:  1. Secondary prevention stressed with the patient.  Importance of compliance with diet and medication stressed and he vocalized understanding.  His blood pressure is stable and he showed me that he is blood pressure reading at home. 2. He came in earlier this morning fasting for blood work including lipids. 3. He has had some atrial fibrillation during hospitalization.  He is on amiodarone therapy.  He was advised to cut it down to 100 mg for 2 weeks and then discontinued it. 4. He will cut down his aspirin to coated aspirin 81 mg 2 tablets daily. 5. Patient will be seen in follow-up appointment in 6 months or earlier if the patient has any concerns 6. Sublingual nitroglycerin prescription was sent, its protocol and 911 protocol explained and the patient vocalized understanding questions were answered to the patient's satisfaction   Medication Adjustments/Labs and Tests Ordered: Current medicines are reviewed at length with the patient today.  Concerns regarding medicines are outlined above.  No orders of the defined types were placed in this encounter.  No orders of the defined types were placed in this encounter.    Chief Complaint  Patient presents with  . Follow-up    8 weeks post-CABG     History of Present Illness:    Thomas Miller is a 67 y.o. male.  Patient has known coronary artery disease post CABG surgery about 8 weeks ago.  He is actively exercising on a very regular basis.  He walks about 3 miles a day without any problems.  No  chest pain orthopnea no palpitations.  At the time of my evaluation, the patient is alert awake oriented and in no distress.  Past Medical History:  Diagnosis Date  . Gout   . Hearing loss   . Hypertension   . Reflux     Past Surgical History:  Procedure Laterality Date  . CORONARY ARTERY BYPASS GRAFT N/A 07/20/2017   Procedure: CORONARY ARTERY BYPASS GRAFTING (CABG) times four using left internal mammary artery to LAD and endoscopically harvested right saphenous vein graft to distal circumflex, intermediate, and PD;  Surgeon: Delight OvensGerhardt, Edward B, MD;  [facility];  Service: Open Heart Surgery;  Laterality: N/A;  . EYE SURGERY Bilateral 2013  . FOOT SURGERY Bilateral    ingrown toenails  . GALLBLADDER SURGERY    . LEFT HEART CATH AND CORONARY ANGIOGRAPHY N/A 07/17/2017   Procedure: LEFT HEART CATH AND CORONARY ANGIOGRAPHY;  Surgeon: Yvonne KendallEnd, Christopher, MD;  Location: [facility] INVASIVE CV LAB;  Service: Cardiovascular;  Laterality: N/A;  . SHOULDER SURGERY Right   . SPINE SURGERY      Current Medications: Current Meds  Medication Sig  . allopurinol (ZYLOPRIM) 300 MG tablet Take 1 tablet (300 mg total) by mouth daily.  [name]. amiodarone (PACERONE) 400 MG tablet Take 200 mg by mouth daily.  [name]. aspirin EC 325 MG EC tablet Take 1 tablet (325 mg total) by mouth daily.  [name]. atorvastatin (  LIPITOR) 80 MG tablet Take 1 tablet (80 mg total) by mouth daily at 6 PM.  . esomeprazole (NEXIUM) 40 MG capsule Take 1 capsule (40 mg total) by mouth daily before breakfast.  . losartan (COZAAR) 25 MG tablet Take 1 tablet (25 mg total) by mouth daily.  . metoprolol tartrate (LOPRESSOR) 25 MG tablet Take 1 tablet (25 mg total) by mouth 2 (two) times daily.  . Multiple Vitamin (MULTIVITAMIN) tablet Take 1 tablet by mouth daily.     Allergies:   Ace inhibitors; Codeine; Tape; and Naprosyn [naproxen]   Social History   Socioeconomic History  . Marital status: Married    Spouse name: Not on file  . Number of  children: Not on file  . Years of education: Not on file  . Highest education level: Not on file  Occupational History  . Not on file  Social Needs  . Financial resource strain: Not on file  . Food insecurity:    Worry: Not on file    Inability: Not on file  . Transportation needs:    Medical: Not on file    Non-medical: Not on file  Tobacco Use  . Smoking status: Never Smoker  . Smokeless tobacco: Never Used  Substance and Sexual Activity  . Alcohol use: Yes    Comment: rarely  . Drug use: No  . Sexual activity: Not on file  Lifestyle  . Physical activity:    Days per week: Not on file    Minutes per session: Not on file  . Stress: Not on file  Relationships  . Social connections:    Talks on phone: Not on file    Gets together: Not on file    Attends religious service: Not on file    Active member of club or organization: Not on file    Attends meetings of clubs or organizations: Not on file    Relationship status: Not on file  Other Topics Concern  . Not on file  Social History Narrative  . Not on file     Family History: The patient's family history includes Diabetes in his mother; Lung cancer in his father; Stroke in his paternal grandfather.  ROS:   Please see the history of present illness.    All other systems reviewed and are negative.  EKGs/Labs/Other Studies Reviewed:    The following studies were reviewed today: I discussed my findings with the patient at extensive length.   Recent Labs: 07/18/2017: ALT 33 07/21/2017: Magnesium 2.5 07/23/2017: TSH 2.079 08/17/2017: BUN 14; Creatinine, Ser 1.25; Hemoglobin 14.3; Platelets 240; Potassium 4.9; Sodium 141  Recent Lipid Panel    Component Value Date/Time   CHOL 146 07/18/2017 0249   CHOL 142 06/03/2017 0811   TRIG 51 07/18/2017 0249   TRIG 123 06/03/2017 0811   HDL 48 07/18/2017 0249   HDL 46 11/27/2016 1425   CHOLHDL 3.0 07/18/2017 0249   VLDL 10 07/18/2017 0249   VLDL 25 06/03/2017 0811    LDLCALC 88 07/18/2017 0249   LDLCALC 80 11/27/2016 1425    Physical Exam:    VS:  BP 140/74 (BP Location: Left Arm, Patient Position: Sitting, Cuff Size: Normal)   Pulse (!) 58   Wt 209 lb 8 oz (95 kg)   SpO2 98%   BMI 30.06 kg/m     Wt Readings from Last 3 Encounters:  09/14/17 209 lb 8 oz (95 kg)  08/31/17 206 lb (93.4 kg)  08/17/17 200 lb (90.7 kg)  GEN: Patient is in no acute distress HEENT: Normal NECK: No JVD; No carotid bruits LYMPHATICS: No lymphadenopathy CARDIAC: Hear sounds regular, 2/6 systolic murmur at the apex. RESPIRATORY:  Clear to auscultation without rales, wheezing or rhonchi  ABDOMEN: Soft, non-tender, non-distended MUSCULOSKELETAL:  No edema; No deformity  SKIN: Warm and dry NEUROLOGIC:  Alert and oriented x 3 PSYCHIATRIC:  Normal affect   Signed, Garwin Brothers, MD  09/14/2017 11:01 AM    [facility]

## 2017-09-15 ENCOUNTER — Encounter: Payer: Self-pay | Admitting: *Deleted

## 2017-09-15 LAB — CBC
Hematocrit: 44.7 % (ref 37.5–51.0)
Hemoglobin: 14.5 g/dL (ref 13.0–17.7)
MCH: 29.7 pg (ref 26.6–33.0)
MCHC: 32.4 g/dL (ref 31.5–35.7)
MCV: 91 fL (ref 79–97)
Platelets: 225 10*3/uL (ref 150–450)
RBC: 4.89 x10E6/uL (ref 4.14–5.80)
RDW: 15 % (ref 12.3–15.4)
WBC: 7.4 10*3/uL (ref 3.4–10.8)

## 2017-09-15 LAB — BASIC METABOLIC PANEL
BUN/Creatinine Ratio: 14 (ref 10–24)
BUN: 14 mg/dL (ref 8–27)
CO2: 24 mmol/L (ref 20–29)
Calcium: 9.8 mg/dL (ref 8.6–10.2)
Chloride: 106 mmol/L (ref 96–106)
Creatinine, Ser: 1.02 mg/dL (ref 0.76–1.27)
GFR calc Af Amer: 87 mL/min/{1.73_m2} (ref >59)
GFR calc non Af Amer: 75 mL/min/{1.73_m2} (ref >59)
Glucose: 92 mg/dL (ref 65–99)
Potassium: 5.2 mmol/L (ref 3.5–5.2)
Sodium: 145 mmol/L — ABNORMAL HIGH (ref 134–144)

## 2017-10-28 ENCOUNTER — Ambulatory Visit (INDEPENDENT_AMBULATORY_CARE_PROVIDER_SITE_OTHER): Payer: Medicare Other

## 2017-10-28 ENCOUNTER — Other Ambulatory Visit: Payer: Self-pay | Admitting: Physician Assistant

## 2017-10-28 VITALS — BP 120/74 | HR 55 | Temp 97.6°F | Resp 16 | Ht 69.0 in | Wt 212.5 lb

## 2017-10-28 DIAGNOSIS — E7800 Pure hypercholesterolemia, unspecified: Secondary | ICD-10-CM | POA: Diagnosis not present

## 2017-10-28 DIAGNOSIS — Z0000 Encounter for general adult medical examination without abnormal findings: Secondary | ICD-10-CM

## 2017-10-28 MED ORDER — ATORVASTATIN CALCIUM 80 MG PO TABS: 80.0000 mg | ORAL_TABLET | Freq: Every day | ORAL | 1 refills | Status: DC

## 2017-10-28 NOTE — Progress Notes (Signed)
Subjective:   Thomas Miller is a 67 y.o. male who presents for Medicare Annual/Subsequent preventive examination.  Review of Systems:  Cardiac Risk Factors include: advanced age (>14men, >45 women);dyslipidemia;hypertension;male gender;obesity (BMI >30kg/m2)     Objective:    Vitals: BP 120/74 (BP Location: Left Arm, Patient Position: Sitting)   Pulse (!) 55   Temp 97.6 F (36.4 C) (Temporal)   Resp 16   Ht 5\' 9"  (1.753 m)   Wt 212 lb 8 oz (96.4 kg)   SpO2 98%   BMI 31.38 kg/m   Body mass index is 31.38 kg/m.  Advanced Directives 10/28/2017 07/18/2017 07/17/2017 10/23/2016  Does Patient Have a Medical Advance Directive? Yes Yes Yes Yes  Type of Advance Directive Living will;Healthcare Power of [employer] Power of [place];Living will Healthcare Power of [place];Living will Healthcare Power of [place];Living will  Does patient want to make changes to medical advance directive? - No - Patient declined - -  Copy of Healthcare Power of Attorney in Chart? No - copy requested No - copy requested No - copy requested No - copy requested    Tobacco Social History   Tobacco Use  Smoking Status Never Smoker  Smokeless Tobacco Never Used     Counseling given: Not Answered   Clinical Intake:  Pre-visit preparation completed: Yes  Pain : No/denies pain     Nutritional Status: BMI > 30  Obese Nutritional Risks: None Diabetes: No  How often do you need to have someone help you when you read instructions, pamphlets, or other written materials from your doctor or pharmacy?: 1 - Never What is the last grade level you completed in school?: associates   Interpreter Needed?: No  Information entered by :: Abbigal Radich,LPN   Past Medical History:  Diagnosis Date  . Gout   . Hearing loss   . Hypertension   . Reflux    Past Surgical History:  Procedure Laterality Date  . CORONARY ARTERY BYPASS GRAFT N/A 07/20/2017   Procedure: CORONARY ARTERY BYPASS GRAFTING (CABG)  times four using left internal mammary artery to LAD and endoscopically harvested right saphenous vein graft to distal circumflex, intermediate, and PD;  Surgeon: [name], MD;  Location: [facility];  Service: Open Heart Surgery;  Laterality: N/A;  . EYE SURGERY Bilateral 2013  . FOOT SURGERY Bilateral    ingrown toenails  . GALLBLADDER SURGERY    . LEFT HEART CATH AND CORONARY ANGIOGRAPHY N/A 07/17/2017   Procedure: LEFT HEART CATH AND CORONARY ANGIOGRAPHY;  Surgeon: Yvonne Kendall, MD;  Location: [facility];  Service: Cardiovascular;  Laterality: N/A;  . SHOULDER SURGERY Right   . SPINE SURGERY     Family History  Problem Relation Age of Onset  . Diabetes Mother   . Lung cancer Father   . Stroke Paternal Grandfather    Social History   Socioeconomic History  . Marital status: Married    Spouse name: Not on file  . Number of children: Not on file  . Years of education: Not on file  . Highest education level: Associate degree: occupational, Scientist, product/process development, or vocational program  Occupational History  . Occupation: retired  Engineer, production  . Financial resource strain: Not hard at all  . Food insecurity:    Worry: Never true    Inability: Never true  . Transportation needs:    Medical: No    Non-medical: No  Tobacco Use  . Smoking status: Never Smoker  . Smokeless tobacco: Never  Used  Substance and Sexual Activity  . Alcohol use: Yes    Comment: rarely  . Drug use: No  . Sexual activity: Not on file  Lifestyle  . Physical activity:    Days per week: 5 days    Minutes per session: 30 min  . Stress: Not at all  Relationships  . Social connections:    Talks on phone: More than three times a week    Gets together: More than three times a week    Attends religious service: More than 4 times per year    Active member of club or organization: No    Attends meetings of clubs or organizations: Never    Relationship status: Married  Other Topics Concern  . Not on  file  Social History Narrative  . Not on file    Outpatient Encounter Medications as of 10/28/2017  Medication Sig  . allopurinol (ZYLOPRIM) 300 MG tablet Take 1 tablet (300 mg total) by mouth daily.  [name] amiodarone (PACERONE) 100 MG tablet Take 1 tablet (100 mg total) by mouth daily.  [name] aspirin EC 81 MG tablet Take 2 tablets (162 mg total) by mouth daily.  [name] atorvastatin (LIPITOR) 80 MG tablet Take 1 tablet (80 mg total) by mouth daily at 6 PM.  . esomeprazole (NEXIUM) 40 MG capsule Take 1 capsule (40 mg total) by mouth daily before breakfast.  . fluticasone (FLONASE) 50 MCG/ACT nasal spray Place 2 sprays into both nostrils daily.  [name] losartan (COZAAR) 25 MG tablet Take 1 tablet (25 mg total) by mouth daily.  . metoprolol tartrate (LOPRESSOR) 25 MG tablet Take 1 tablet (25 mg total) by mouth 2 (two) times daily.  . Multiple Vitamin (MULTIVITAMIN) tablet Take 1 tablet by mouth daily.  . [DISCONTINUED] atorvastatin (LIPITOR) 80 MG tablet Take 1 tablet (80 mg total) by mouth daily at 6 PM.  . nitroGLYCERIN (NITROSTAT) 0.4 MG SL tablet Place 1 tablet (0.4 mg total) under the tongue every 5 (five) minutes as needed for chest pain. (Patient not taking: Reported on 10/28/2017)   No facility-administered encounter medications on file as of 10/28/2017.     Activities of Daily Living In your present state of health, do you have any difficulty performing the following activities: 10/28/2017 07/18/2017  Hearing? N N  Vision? N N  Comment reading glasses -  Difficulty concentrating or making decisions? N N  Walking or climbing stairs? N N  Dressing or bathing? N N  Doing errands, shopping? N N  Preparing Food and eating ? N -  Using the Toilet? N -  In the past six months, have you accidently leaked urine? N -  Do you have problems with loss of bowel control? N -  Managing your Medications? N -  Managing your Finances? N -  Housekeeping or managing your Housekeeping? N -  Some recent data might be  hidden    Patient Care Team: Steele Sizer, MD as PCP - General (Family Medicine) Revankar, Aundra Dubin, MD as PCP - Cardiology (Cardiology) Tia Alert, MD as Consulting Physician (Neurosurgery) [name], MD as Consulting Physician (Cardiothoracic Surgery)   Assessment:   This is a routine wellness examination for Ger.  Exercise Activities and Dietary recommendations Current Exercise Habits: Home exercise routine, Time (Minutes): 30, Frequency (Times/Week): 7, Weekly Exercise (Minutes/Week): 210, Intensity: Mild, Exercise limited by: None identified  Goals    . DIET - INCREASE WATER INTAKE     Recommend drinking at least  6-8 glasses of water a day        Fall Risk Fall Risk  10/28/2017 08/17/2017 06/03/2017 10/23/2016 08/25/2016  Falls in the past year? No No No No No   FALL RISK PREVENTION PERTAINING TO THE HOME:  Any stairs in or around the home WITH handrails? Yes  Home free of loose throw rugs in walkways, pet beds, electrical cords, etc? Yes  Adequate lighting in your home to reduce risk of falls? Yes   ASSISTIVE DEVICES UTILIZED TO PREVENT FALLS:  Life alert? No  Use of a cane, walker or w/c? No  Grab bars in the bathroom? Yes  Shower chair or bench in shower? Yes  Elevated toilet seat or a handicapped toilet? Yes   DME ORDERS:  DME order needed?  No   TIMED UP AND GO:  Was the test performed? Yes .  Length of time to ambulate 10 feet: 8 sec.   GAIT:  Appearance of gait: Gait stead-fast and steady without the use of an assistive  Depression Screen PHQ 2/9 Scores 10/28/2017 08/17/2017 06/03/2017 10/23/2016  PHQ - 2 Score 0 0 0 0    Cognitive Function     6CIT Screen 10/28/2017 10/23/2016  What Year? 0 points 0 points  What month? 0 points 0 points  What time? 0 points 0 points  Count back from 20 0 points 0 points  Months in reverse 0 points 0 points  Repeat phrase 0 points 0 points  Total Score 0 0    Immunization History  Administered  Date(s) Administered  . Pneumococcal Conjugate-13 11/19/2015  . Td 11/10/2007  . Zoster 02/17/2009    Qualifies for Shingles Vaccine? Yes  Zostavax completed 02/17/2009. Due for Shingrix. Education has been provided regarding the importance of this vaccine. Pt has been advised to call insurance company to determine out of pocket expense. Advised may also receive vaccine at local pharmacy or Health Dept. Verbalized acceptance and understanding.  Tetanus: completed 11/10/2007  Flu Vaccine: Due for Flu vaccine. Does the patient want to receive this vaccine today?  No . Education has been provided regarding the importance of this vaccine but still declined. Advised may receive this vaccine at local pharmacy or Health Dept. Aware to provide a copy of the vaccination record if obtained from local pharmacy or Health Dept. Verbalized acceptance and understanding.  Pneumococcal Vaccine: Due for Pneumococcal vaccine (pneumovax 23). Does the patient want to receive this vaccine today?  No . Education has been provided regarding the importance of this vaccine but still declined. Advised may receive this vaccine at local pharmacy or Health Dept. Aware to provide a copy of the vaccination record if obtained from local pharmacy or Health Dept. Verbalized acceptance and understanding.  Screening Tests Health Maintenance  Topic Date Due  . INFLUENZA VACCINE  08/27/2017  . PNA vac Low Risk Adult (2 of 2 - PPSV23) 07/07/2018 (Originally 11/18/2016)  . TETANUS/TDAP  11/09/2017  . Fecal DNA (Cologuard)  12/10/2018  . Hepatitis C Screening  Completed   Cancer Screenings:  Colorectal Screening: Cologuard Completed 12/10/2015. Repeat every 3 years;    Lung Cancer Screening: (Low Dose CT Chest recommended if Age 24-80 years, 30 pack-year currently smoking OR have quit w/in 15years.) does not qualify.    Additional Screening:  Hepatitis C Screening: does qualify; Completed 05/07/2015  Vision Screening:  Recommended annual ophthalmology exams for early detection of glaucoma and other disorders of the eye. Is the patient up to date with their annual  eye exam?  Yes  Who is the provider or what is the name of the office in which the pt attends annual eye exams? [facility]   Dental Screening: Recommended annual dental exams for proper oral hygiene  Community Resource Referral:  CRR required this visit?  No       Plan:    I have personally reviewed and addressed the Medicare Annual Wellness questionnaire and have noted the following in the patient's chart:  A. Medical and social history B. Use of alcohol, tobacco or illicit drugs  C. Current medications and supplements D. Functional ability and status E.  Nutritional status F.  Physical activity G. Advance directives H. List of other physicians I.  Hospitalizations, surgeries, and ER visits in previous 12 months J.  Vitals K. Screenings such as hearing and vision if needed, cognitive and depression L. Referrals and appointments   In addition, I have reviewed and discussed with patient certain preventive protocols, quality metrics, and best practice recommendations. A written personalized care plan for preventive services as well as general preventive health recommendations were provided to patient.   Signed,  Marin Roberts, LPN Nurse Health Advisor   Nurse Notes: Needs atorvastatin refilled, was filled by Dr.Zimmerman in the past. He doesnt have a follow up until 12/16/2017 and ran out last night.   Verbal order for atorvastatin refill placed for [facility]

## 2017-10-28 NOTE — Patient Instructions (Addendum)
Thomas Miller , Thank you for taking time to come for your Medicare Wellness Visit. I appreciate your ongoing commitment to your health goals. Please review the following plan we discussed and let me know if I can assist you in the future.   Screening recommendations/referrals: Colonoscopy: cologuard completed 12/10/2015 Recommended yearly ophthalmology/optometry visit for glaucoma screening and checkup Recommended yearly dental visit for hygiene and checkup  Vaccinations: Influenza vaccine: due now- declined  Pneumococcal vaccine: pneumovax 23 due now- declined  Tdap vaccine: up to date Shingles vaccine: shingrix eligible, check with your insurance company for coverage   Advanced directives: Please bring a copy of your health care power of attorney and living will to the office at your convenience.  Conditions/risks identified: Recommend drinking at least 6-8 glasses of water a day   Next appointment: Follow up in one year for your annual wellness exam.   Preventive Care 65 Years and Older, Male Preventive care refers to lifestyle choices and visits with your health care provider that can promote health and wellness. What does preventive care include?  A yearly physical exam. This is also called an annual well check.  Dental exams once or twice a year.  Routine eye exams. Ask your health care provider how often you should have your eyes checked.  Personal lifestyle choices, including:  Daily care of your teeth and gums.  Regular physical activity.  Eating a healthy diet.  Avoiding tobacco and drug use.  Limiting alcohol use.  Practicing safe sex.  Taking low doses of aspirin every day.  Taking vitamin and mineral supplements as recommended by your health care provider. What happens during an annual well check? The services and screenings done by your health care provider during your annual well check will depend on your age, overall health, lifestyle risk factors, and  family history of disease. Counseling  Your health care provider may ask you questions about your:  Alcohol use.  Tobacco use.  Drug use.  Emotional well-being.  Home and relationship well-being.  Sexual activity.  Eating habits.  History of falls.  Memory and ability to understand (cognition).  Work and work Astronomer. Screening  You may have the following tests or measurements:  Height, weight, and BMI.  Blood pressure.  Lipid and cholesterol levels. These may be checked every 5 years, or more frequently if you are over 30 years old.  Skin check.  Lung cancer screening. You may have this screening every year starting at age 25 if you have a 30-pack-year history of smoking and currently smoke or have quit within the past 15 years.  Fecal occult blood test (FOBT) of the stool. You may have this test every year starting at age 98.  Flexible sigmoidoscopy or colonoscopy. You may have a sigmoidoscopy every 5 years or a colonoscopy every 10 years starting at age 16.  Prostate cancer screening. Recommendations will vary depending on your family history and other risks.  Hepatitis C blood test.  Hepatitis B blood test.  Sexually transmitted disease (STD) testing.  Diabetes screening. This is done by checking your blood sugar (glucose) after you have not eaten for a while (fasting). You may have this done every 1-3 years.  Abdominal aortic aneurysm (AAA) screening. You may need this if you are a current or former smoker.  Osteoporosis. You may be screened starting at age 57 if you are at high risk. Talk with your health care provider about your test results, treatment options, and if necessary, the need for  more tests. Vaccines  Your health care provider may recommend certain vaccines, such as:  Influenza vaccine. This is recommended every year.  Tetanus, diphtheria, and acellular pertussis (Tdap, Td) vaccine. You may need a Td booster every 10 years.  Zoster  vaccine. You may need this after age 62.  Pneumococcal 13-valent conjugate (PCV13) vaccine. One dose is recommended after age 79.  Pneumococcal polysaccharide (PPSV23) vaccine. One dose is recommended after age 70. Talk to your health care provider about which screenings and vaccines you need and how often you need them. This information is not intended to replace advice given to you by your health care provider. Make sure you discuss any questions you have with your health care provider. Document Released: 02/09/2015 Document Revised: 10/03/2015 Document Reviewed: 11/14/2014 Elsevier Interactive Patient Education  2017 [place] Prevention in the Home Falls can cause injuries. They can happen to people of all ages. There are many things you can do to make your home safe and to help prevent falls. What can I do on the outside of my home?  Regularly fix the edges of walkways and driveways and fix any cracks.  Remove anything that might make you trip as you walk through a door, such as a raised step or threshold.  Trim any bushes or trees on the path to your home.  Use bright outdoor lighting.  Clear any walking paths of anything that might make someone trip, such as rocks or tools.  Regularly check to see if handrails are loose or broken. Make sure that both sides of any steps have handrails.  Any raised decks and porches should have guardrails on the edges.  Have any leaves, snow, or ice cleared regularly.  Use sand or salt on walking paths during winter.  Clean up any spills in your garage right away. This includes oil or grease spills. What can I do in the bathroom?  Use night lights.  Install grab bars by the toilet and in the tub and shower. Do not use towel bars as grab bars.  Use non-skid mats or decals in the tub or shower.  If you need to sit down in the shower, use a plastic, non-slip stool.  Keep the floor dry. Clean up any water that spills on the  floor as soon as it happens.  Remove soap buildup in the tub or shower regularly.  Attach bath mats securely with double-sided non-slip rug tape.  Do not have throw rugs and other things on the floor that can make you trip. What can I do in the bedroom?  Use night lights.  Make sure that you have a light by your bed that is easy to reach.  Do not use any sheets or blankets that are too big for your bed. They should not hang down onto the floor.  Have a firm chair that has side arms. You can use this for support while you get dressed.  Do not have throw rugs and other things on the floor that can make you trip. What can I do in the kitchen?  Clean up any spills right away.  Avoid walking on wet floors.  Keep items that you use a lot in easy-to-reach places.  If you need to reach something above you, use a strong step stool that has a grab bar.  Keep electrical cords out of the way.  Do not use floor polish or wax that makes floors slippery. If you must use wax, use non-skid  floor wax.  Do not have throw rugs and other things on the floor that can make you trip. What can I do with my stairs?  Do not leave any items on the stairs.  Make sure that there are handrails on both sides of the stairs and use them. Fix handrails that are broken or loose. Make sure that handrails are as long as the stairways.  Check any carpeting to make sure that it is firmly attached to the stairs. Fix any carpet that is loose or worn.  Avoid having throw rugs at the top or bottom of the stairs. If you do have throw rugs, attach them to the floor with carpet tape.  Make sure that you have a light switch at the top of the stairs and the bottom of the stairs. If you do not have them, ask someone to add them for you. What else can I do to help prevent falls?  Wear shoes that:  Do not have high heels.  Have rubber bottoms.  Are comfortable and fit you well.  Are closed at the toe. Do not wear  sandals.  If you use a stepladder:  Make sure that it is fully opened. Do not climb a closed stepladder.  Make sure that both sides of the stepladder are locked into place.  Ask someone to hold it for you, if possible.  Clearly mark and make sure that you can see:  Any grab bars or handrails.  First and last steps.  Where the edge of each step is.  Use tools that help you move around (mobility aids) if they are needed. These include:  Canes.  Walkers.  Scooters.  Crutches.  Turn on the lights when you go into a dark area. Replace any light bulbs as soon as they burn out.  Set up your furniture so you have a clear path. Avoid moving your furniture around.  If any of your floors are uneven, fix them.  If there are any pets around you, be aware of where they are.  Review your medicines with your doctor. Some medicines can make you feel dizzy. This can increase your chance of falling. Ask your doctor what other things that you can do to help prevent falls. This information is not intended to replace advice given to you by your health care provider. Make sure you discuss any questions you have with your health care provider. Document Released: 11/09/2008 Document Revised: 06/21/2015 Document Reviewed: 02/17/2014 Elsevier Interactive Patient Education  2017 [employer].

## 2017-12-16 ENCOUNTER — Ambulatory Visit (INDEPENDENT_AMBULATORY_CARE_PROVIDER_SITE_OTHER): Payer: Medicare Other | Admitting: Family Medicine

## 2017-12-16 ENCOUNTER — Encounter: Payer: Self-pay | Admitting: Family Medicine

## 2017-12-16 DIAGNOSIS — E7800 Pure hypercholesterolemia, unspecified: Secondary | ICD-10-CM

## 2017-12-16 DIAGNOSIS — Z951 Presence of aortocoronary bypass graft: Secondary | ICD-10-CM

## 2017-12-16 DIAGNOSIS — I480 Paroxysmal atrial fibrillation: Secondary | ICD-10-CM | POA: Diagnosis not present

## 2017-12-16 DIAGNOSIS — K219 Gastro-esophageal reflux disease without esophagitis: Secondary | ICD-10-CM

## 2017-12-16 DIAGNOSIS — Z7189 Other specified counseling: Secondary | ICD-10-CM | POA: Diagnosis not present

## 2017-12-16 DIAGNOSIS — M5416 Radiculopathy, lumbar region: Secondary | ICD-10-CM

## 2017-12-16 DIAGNOSIS — M1000 Idiopathic gout, unspecified site: Secondary | ICD-10-CM

## 2017-12-16 DIAGNOSIS — N400 Enlarged prostate without lower urinary tract symptoms: Secondary | ICD-10-CM

## 2017-12-16 LAB — URINALYSIS, ROUTINE W REFLEX MICROSCOPIC
Bilirubin, UA: NEGATIVE
Glucose, UA: NEGATIVE
Ketones, UA: NEGATIVE
Leukocytes, UA: NEGATIVE
Nitrite, UA: NEGATIVE
Protein, UA: NEGATIVE
RBC, UA: NEGATIVE
Specific Gravity, UA: 1.02 (ref 1.005–1.030)
Urobilinogen, Ur: 0.2 mg/dL (ref 0.2–1.0)
pH, UA: 5.5 (ref 5.0–7.5)

## 2017-12-16 MED ORDER — ALLOPURINOL 300 MG PO TABS: 300.0000 mg | ORAL_TABLET | Freq: Every day | ORAL | 4 refills | Status: DC

## 2017-12-16 MED ORDER — ATORVASTATIN CALCIUM 80 MG PO TABS: 80.0000 mg | ORAL_TABLET | Freq: Every day | ORAL | 4 refills | Status: DC

## 2017-12-16 MED ORDER — LOSARTAN POTASSIUM 25 MG PO TABS: 25.0000 mg | ORAL_TABLET | Freq: Every day | ORAL | 4 refills | Status: DC

## 2017-12-16 MED ORDER — METOPROLOL TARTRATE 25 MG PO TABS: 25.0000 mg | ORAL_TABLET | Freq: Two times a day (BID) | ORAL | 4 refills | Status: DC

## 2017-12-16 MED ORDER — ESOMEPRAZOLE MAGNESIUM 40 MG PO CPDR: 40.0000 mg | DELAYED_RELEASE_CAPSULE | Freq: Every day | ORAL | 4 refills | Status: DC

## 2017-12-16 NOTE — Assessment & Plan Note (Signed)
The current medical regimen is effective;  continue present plan and medications.  

## 2017-12-16 NOTE — Progress Notes (Signed)
BP (!) 143/89 (BP Location: Left Arm, Patient Position: Sitting, Cuff Size: Normal)   Pulse (!) 51   Temp (!) 97.5 F (36.4 C) (Oral)   Ht 5\' 10"  (1.778 m)   Wt 215 lb 12.8 oz (97.9 kg)   SpO2 98%   BMI 30.96 kg/m    Subjective:    Patient ID: Thomas Miller, male    DOB: 27-Mar-1950, 67 y.o.   MRN: [id number]  HPI: Thomas Miller is a 67 y.o. male  Chief Complaint  Patient presents with  . Annual Exam   Patient all in all improving dramatically coronary artery disease has been stable recovering well from his bypass grafting surgery. Patient is bothered by left shoulder pain discomfort which started after surgery.  This limits his range of motion with limited lifting and motion above his shoulder. Patient also has some right hip sciatica that radiates down into his leg especially when walking uphill. Relevant past medical, surgical, family and social history reviewed and updated as indicated. Interim medical history since our last visit reviewed. Allergies and medications reviewed and updated.  Review of Systems  Constitutional: Negative.   HENT: Negative.   Eyes: Negative.   Respiratory: Negative.   Cardiovascular: Negative.   Gastrointestinal: Negative.   Endocrine: Negative.   Genitourinary: Negative.   Musculoskeletal: Negative.   Skin: Negative.   Allergic/Immunologic: Negative.   Neurological: Negative.   Hematological: Negative.   Psychiatric/Behavioral: Negative.    Review of systems negative except for as noted sciatica symptoms on the right side. Left shoulder bursitis rotator cuff type changes. Patient also with some left temple area tenderness after prolonged contact such as sleeping on his left side.  There is no tenderness to palpation.  No visual complaints.  Per HPI unless specifically indicated above     Objective:    BP (!) 143/89 (BP Location: Left Arm, Patient Position: Sitting, Cuff Size: Normal)   Pulse (!) 51   Temp (!) 97.5 F (36.4 C)  (Oral)   Ht 5\' 10"  (1.778 m)   Wt 215 lb 12.8 oz (97.9 kg)   SpO2 98%   BMI 30.96 kg/m   Wt Readings from Last 3 Encounters:  12/16/17 215 lb 12.8 oz (97.9 kg)  10/28/17 212 lb 8 oz (96.4 kg)  09/14/17 209 lb 8 oz (95 kg)    Physical Exam  Constitutional: He is oriented to person, place, and time. He appears well-developed and well-nourished.  HENT:  Head: Normocephalic and atraumatic.  Right Ear: External ear normal.  Left Ear: External ear normal.  Nose: Nose normal.  Eyes: Pupils are equal, round, and reactive to light. Conjunctivae and EOM are normal.  Neck: Normal range of motion. Neck supple. No thyromegaly present.  Cardiovascular: Normal rate, regular rhythm, normal heart sounds and intact distal pulses.  Pulmonary/Chest: Effort normal and breath sounds normal.  Abdominal: Soft. Bowel sounds are normal. There is no splenomegaly or hepatomegaly.  Genitourinary: Rectum normal, prostate normal and penis normal.  Musculoskeletal: Normal range of motion.  Lymphadenopathy:    He has no cervical adenopathy.  Neurological: He is alert and oriented to person, place, and time. He has normal reflexes.  Skin: Skin is warm and dry. No rash noted. No erythema.  Psychiatric: He has a normal mood and affect. His behavior is normal. Judgment and thought content normal.    Results for orders placed or performed in visit on 08/17/17  Basic metabolic panel  Result Value Ref Range  Glucose 95 65 - 99 mg/dL   BUN 14 8 - 27 mg/dL   Creatinine, Ser 1.61 0.76 - 1.27 mg/dL   GFR calc non Af Amer 59 (L) >59 mL/min/1.73   GFR calc Af Amer 68 >59 mL/min/1.73   BUN/Creatinine Ratio 11 10 - 24   Sodium 141 134 - 144 mmol/L   Potassium 4.9 3.5 - 5.2 mmol/L   Chloride 102 96 - 106 mmol/L   CO2 24 20 - 29 mmol/L   Calcium 9.9 8.6 - 10.2 mg/dL  CBC with Differential/Platelet  Result Value Ref Range   WBC 7.0 3.4 - 10.8 x10E3/uL   RBC 4.96 4.14 - 5.80 x10E6/uL   Hemoglobin 14.3 13.0 - 17.7  g/dL   Hematocrit 09.6 04.5 - 51.0 %   MCV 87 79 - 97 fL   MCH 28.8 26.6 - 33.0 pg   MCHC 33.0 31.5 - 35.7 g/dL   RDW 40.9 81.1 - 91.4 %   Platelets 240 150 - 450 x10E3/uL   Neutrophils 61 Not Estab. %   Lymphs 21 Not Estab. %   Monocytes 8 Not Estab. %   Eos 9 Not Estab. %   Basos 1 Not Estab. %   Neutrophils Absolute 4.3 1.4 - 7.0 x10E3/uL   Lymphocytes Absolute 1.5 0.7 - 3.1 x10E3/uL   Monocytes Absolute 0.5 0.1 - 0.9 x10E3/uL   EOS (ABSOLUTE) 0.6 (H) 0.0 - 0.4 x10E3/uL   Basophils Absolute 0.1 0.0 - 0.2 x10E3/uL   Immature Granulocytes 0 Not Estab. %   Immature Grans (Abs) 0.0 0.0 - 0.1 x10E3/uL      Assessment & Plan:   Problem List Items Addressed This Visit      Cardiovascular and Mediastinum   PAF (paroxysmal atrial fibrillation) (HCC)    The current medical regimen is effective;  continue present plan and medications.       Relevant Medications   atorvastatin (LIPITOR) 80 MG tablet   metoprolol tartrate (LOPRESSOR) 25 MG tablet   losartan (COZAAR) 25 MG tablet   Other Relevant Orders   TSH   Urinalysis, Routine w reflex microscopic     Digestive   GERD (gastroesophageal reflux disease)   Relevant Medications   esomeprazole (NEXIUM) 40 MG capsule     Nervous and Auditory   Lumbar radiculopathy    Discussed care and tx        Genitourinary   BPH (benign prostatic hyperplasia)    Stable       Relevant Orders   PSA     Other   Gout   Relevant Medications   allopurinol (ZYLOPRIM) 300 MG tablet   Other Relevant Orders   Uric acid   Hyperlipemia    The current medical regimen is effective;  continue present plan and medications.       Relevant Medications   atorvastatin (LIPITOR) 80 MG tablet   metoprolol tartrate (LOPRESSOR) 25 MG tablet   losartan (COZAAR) 25 MG tablet   Other Relevant Orders   Comprehensive metabolic panel   Lipid panel   Advanced care planning/counseling discussion    A voluntary discussion about advanced care  planning including explanation and discussion of advanced directives was extentively discussed with the patient.  Explained about the healthcare proxy and living will was reviewed and packet with forms with expiration of how to fill them out was given.  Time spent: Encounter 16+ min individuals present: Patient      S/P CABG x 4    The current  medical regimen is effective;  continue present plan and medications.       Relevant Orders   Comprehensive metabolic panel   Lipid panel   CBC with Differential/Platelet   TSH   Urinalysis, Routine w reflex microscopic       Follow up plan: Return in about 6 months (around 06/16/2018) for BMP,  Lipids, ALT, AST.

## 2017-12-16 NOTE — Assessment & Plan Note (Signed)
Stable

## 2017-12-16 NOTE — Assessment & Plan Note (Signed)
A voluntary discussion about advanced care planning including explanation and discussion of advanced directives was extentively discussed with the patient.  Explained about the healthcare proxy and living will was reviewed and packet with forms with expiration of how to fill them out was given.  Time spent: Encounter 16+ min individuals present: Patient 

## 2017-12-16 NOTE — Assessment & Plan Note (Signed)
Discussed care and tx 

## 2017-12-17 ENCOUNTER — Encounter: Payer: Self-pay | Admitting: Family Medicine

## 2017-12-17 LAB — COMPREHENSIVE METABOLIC PANEL
ALT: 23 IU/L (ref 0–44)
AST: 23 IU/L (ref 0–40)
Albumin/Globulin Ratio: 2.6 — ABNORMAL HIGH (ref 1.2–2.2)
Albumin: 5 g/dL — ABNORMAL HIGH (ref 3.6–4.8)
Alkaline Phosphatase: 92 IU/L (ref 39–117)
BUN/Creatinine Ratio: 14 (ref 10–24)
BUN: 15 mg/dL (ref 8–27)
Bilirubin Total: 0.6 mg/dL (ref 0.0–1.2)
CO2: 25 mmol/L (ref 20–29)
Calcium: 9.7 mg/dL (ref 8.6–10.2)
Chloride: 102 mmol/L (ref 96–106)
Creatinine, Ser: 1.11 mg/dL (ref 0.76–1.27)
GFR calc Af Amer: 78 mL/min/{1.73_m2} (ref >59)
GFR calc non Af Amer: 68 mL/min/{1.73_m2} (ref >59)
Globulin, Total: 1.9 g/dL (ref 1.5–4.5)
Glucose: 99 mg/dL (ref 65–99)
Potassium: 4.8 mmol/L (ref 3.5–5.2)
Sodium: 142 mmol/L (ref 134–144)
Total Protein: 6.9 g/dL (ref 6.0–8.5)

## 2017-12-17 LAB — CBC WITH DIFFERENTIAL/PLATELET
Basophils Absolute: 0.1 10*3/uL (ref 0.0–0.2)
Basos: 1 %
EOS (ABSOLUTE): 0.1 10*3/uL (ref 0.0–0.4)
Eos: 2 %
Hematocrit: 49 % (ref 37.5–51.0)
Hemoglobin: 15.9 g/dL (ref 13.0–17.7)
Immature Grans (Abs): 0 10*3/uL (ref 0.0–0.1)
Immature Granulocytes: 0 %
Lymphocytes Absolute: 2.1 10*3/uL (ref 0.7–3.1)
Lymphs: 32 %
MCH: 27.5 pg (ref 26.6–33.0)
MCHC: 32.4 g/dL (ref 31.5–35.7)
MCV: 85 fL (ref 79–97)
Monocytes Absolute: 0.5 10*3/uL (ref 0.1–0.9)
Monocytes: 8 %
Neutrophils Absolute: 3.7 10*3/uL (ref 1.4–7.0)
Neutrophils: 57 %
Platelets: 220 10*3/uL (ref 150–450)
RBC: 5.79 x10E6/uL (ref 4.14–5.80)
RDW: 13.7 % (ref 12.3–15.4)
WBC: 6.5 10*3/uL (ref 3.4–10.8)

## 2017-12-17 LAB — LIPID PANEL
Chol/HDL Ratio: 3.2 ratio (ref 0.0–5.0)
Cholesterol, Total: 145 mg/dL (ref 100–199)
HDL: 46 mg/dL (ref >39)
LDL Calculated: 74 mg/dL (ref 0–99)
Triglycerides: 123 mg/dL (ref 0–149)
VLDL Cholesterol Cal: 25 mg/dL (ref 5–40)

## 2017-12-17 LAB — TSH: TSH: 2.43 u[IU]/mL (ref 0.450–4.500)

## 2017-12-17 LAB — URIC ACID: Uric Acid: 4.2 mg/dL (ref 3.7–8.6)

## 2017-12-17 LAB — PSA: Prostate Specific Ag, Serum: 2.3 ng/mL (ref 0.0–4.0)

## 2018-01-02 ENCOUNTER — Encounter: Payer: Self-pay | Admitting: Emergency Medicine

## 2018-01-02 ENCOUNTER — Emergency Department
Admission: EM | Admit: 2018-01-02 | Discharge: 2018-01-02 | Disposition: A | Discharge status: Home / Self Care | Payer: Medicare Other | Attending: Emergency Medicine | Admitting: Emergency Medicine

## 2018-01-02 ENCOUNTER — Other Ambulatory Visit: Payer: Self-pay

## 2018-01-02 ENCOUNTER — Emergency Department: Payer: Medicare Other

## 2018-01-02 DIAGNOSIS — Z951 Presence of aortocoronary bypass graft: Secondary | ICD-10-CM | POA: Diagnosis not present

## 2018-01-02 DIAGNOSIS — I10 Essential (primary) hypertension: Secondary | ICD-10-CM | POA: Diagnosis not present

## 2018-01-02 DIAGNOSIS — N23 Unspecified renal colic: Secondary | ICD-10-CM | POA: Insufficient documentation

## 2018-01-02 DIAGNOSIS — Z79899 Other long term (current) drug therapy: Secondary | ICD-10-CM | POA: Insufficient documentation

## 2018-01-02 DIAGNOSIS — I2510 Atherosclerotic heart disease of native coronary artery without angina pectoris: Secondary | ICD-10-CM | POA: Insufficient documentation

## 2018-01-02 DIAGNOSIS — N132 Hydronephrosis with renal and ureteral calculous obstruction: Secondary | ICD-10-CM | POA: Diagnosis not present

## 2018-01-02 DIAGNOSIS — R1032 Left lower quadrant pain: Secondary | ICD-10-CM | POA: Diagnosis not present

## 2018-01-02 LAB — URINALYSIS, COMPLETE (UACMP) WITH MICROSCOPIC
Bacteria, UA: NONE SEEN
Bilirubin Urine: NEGATIVE
Glucose, UA: NEGATIVE mg/dL
Ketones, ur: NEGATIVE mg/dL
Leukocytes, UA: NEGATIVE
Nitrite: NEGATIVE
Protein, ur: NEGATIVE mg/dL
RBC / HPF: >50 RBC/hpf — ABNORMAL HIGH (ref 0–5)
Specific Gravity, Urine: 1.023 (ref 1.005–1.030)
Squamous Epithelial / HPF: NONE SEEN (ref 0–5)
pH: 5 (ref 5.0–8.0)

## 2018-01-02 LAB — COMPREHENSIVE METABOLIC PANEL
ALT: 25 U/L (ref 0–44)
AST: 27 U/L (ref 15–41)
Albumin: 4.8 g/dL (ref 3.5–5.0)
Alkaline Phosphatase: 89 U/L (ref 38–126)
Anion gap: 10 (ref 5–15)
BUN: 20 mg/dL (ref 8–23)
CO2: 25 mmol/L (ref 22–32)
Calcium: 9.6 mg/dL (ref 8.9–10.3)
Chloride: 108 mmol/L (ref 98–111)
Creatinine, Ser: 1.17 mg/dL (ref 0.61–1.24)
GFR calc Af Amer: >60 mL/min (ref >60)
GFR calc non Af Amer: >60 mL/min (ref >60)
Glucose, Bld: 92 mg/dL (ref 70–99)
Potassium: 4.2 mmol/L (ref 3.5–5.1)
Sodium: 143 mmol/L (ref 135–145)
Total Bilirubin: 1 mg/dL (ref 0.3–1.2)
Total Protein: 7.5 g/dL (ref 6.5–8.1)

## 2018-01-02 LAB — CBC WITH DIFFERENTIAL/PLATELET
Abs Immature Granulocytes: 0.06 10*3/uL (ref 0.00–0.07)
Basophils Absolute: 0.1 10*3/uL (ref 0.0–0.1)
Basophils Relative: 1 %
Eosinophils Absolute: 0.2 10*3/uL (ref 0.0–0.5)
Eosinophils Relative: 2 %
HCT: 50.8 % (ref 39.0–52.0)
Hemoglobin: 16.3 g/dL (ref 13.0–17.0)
Immature Granulocytes: 1 %
Lymphocytes Relative: 31 %
Lymphs Abs: 2.6 10*3/uL (ref 0.7–4.0)
MCH: 28.4 pg (ref 26.0–34.0)
MCHC: 32.1 g/dL (ref 30.0–36.0)
MCV: 88.5 fL (ref 80.0–100.0)
Monocytes Absolute: 0.8 10*3/uL (ref 0.1–1.0)
Monocytes Relative: 9 %
Neutro Abs: 4.6 10*3/uL (ref 1.7–7.7)
Neutrophils Relative %: 56 %
Platelets: 220 10*3/uL (ref 150–400)
RBC: 5.74 MIL/uL (ref 4.22–5.81)
RDW: 14.5 % (ref 11.5–15.5)
WBC: 8.3 10*3/uL (ref 4.0–10.5)
nRBC: 0 % (ref 0.0–0.2)

## 2018-01-02 LAB — LIPASE, BLOOD: Lipase: 36 U/L (ref 11–51)

## 2018-01-02 MED ORDER — OXYCODONE-ACETAMINOPHEN 5-325 MG PO TABS: 1.0000 | ORAL_TABLET | ORAL | 0 refills | Status: DC | PRN

## 2018-01-02 MED ORDER — IOPAMIDOL (ISOVUE-300) INJECTION 61%
15.0000 mL | INTRAVENOUS | Status: AC
  Administered 2018-01-02: 15 mL via ORAL

## 2018-01-02 MED ORDER — HYDROMORPHONE HCL 1 MG/ML IJ SOLN
0.5000 mg | Freq: Once | INTRAMUSCULAR | Status: AC
  Administered 2018-01-02: 0.5 mg via INTRAVENOUS
  Filled 2018-01-02: qty 1

## 2018-01-02 MED ORDER — PROMETHAZINE HCL 25 MG/ML IJ SOLN
12.5000 mg | Freq: Once | INTRAMUSCULAR | Status: AC
  Administered 2018-01-02: 12.5 mg via INTRAVENOUS

## 2018-01-02 MED ORDER — ONDANSETRON HCL 4 MG/2ML IJ SOLN
4.0000 mg | INTRAMUSCULAR | Status: AC
  Administered 2018-01-02: 4 mg via INTRAVENOUS
  Filled 2018-01-02: qty 2

## 2018-01-02 MED ORDER — IOHEXOL 300 MG/ML  SOLN
100.0000 mL | Freq: Once | INTRAMUSCULAR | Status: AC | PRN
  Administered 2018-01-02: 100 mL via INTRAVENOUS

## 2018-01-02 MED ORDER — PROMETHAZINE HCL 25 MG/ML IJ SOLN
INTRAMUSCULAR | Status: AC
  Filled 2018-01-02: qty 1

## 2018-01-02 MED ORDER — MORPHINE SULFATE (PF) 4 MG/ML IV SOLN
4.0000 mg | INTRAVENOUS | Status: AC
  Administered 2018-01-02: 4 mg via INTRAVENOUS
  Filled 2018-01-02: qty 1

## 2018-01-02 NOTE — Discharge Instructions (Addendum)
Please return for worse pain, fever or vomiting.  Please take the Percocet 1 to 2 pills 4 times a day as needed for pain.  If that is not working please return here.  The kidney stone is almost ready to come into the bladder.  If that happens the pain should decrease significantly you should be out of past the stone.  Please strain your urine attempt to catch the stone.  Please call Dr. Apolinar JunesBrandon the urologist for a follow-up appointment.  Call her on Monday she should be out of see you in a week or 2.  If she cannot see you after 2 weeks please return here for recheck.

## 2018-01-02 NOTE — ED Provider Notes (Signed)
[facility] Emergency Department Provider Note   ____________________________________________   First MD Initiated Contact with Patient 01/02/18 1047     (approximate)  I have reviewed the triage vital signs and the nursing notes.   HISTORY  Chief Complaint Abdominal Pain    HPI Thomas Miller is a 67 y.o. male who reports he woke up this morning with pain in the left lower quadrant of his belly.  It radiates through to the back and from the back through the front.  He is not having any nausea vomiting or diarrhea.  The pain is moderately severe and he is uncomfortable at present.  Is a sharp stabbing type of pain not made worse with palpation it is been there constantly.   Past Medical History:  Diagnosis Date  . Gout   . Hearing loss   . Hypertension   . Reflux     Patient Active Problem List   Diagnosis Date Noted  . PAF (paroxysmal atrial fibrillation) (HCC) 08/07/2017  . CAD (coronary artery disease) 08/07/2017  . Atrial fibrillation (HCC) 07/26/2017  . 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  . Lumbar radiculopathy 11/27/2016  . Advanced care planning/counseling discussion 11/27/2016  . Dermatitis 08/25/2016  . Insomnia 05/19/2016  . Hypertension 10/25/2014  . Gout 10/25/2014  . GERD (gastroesophageal reflux disease) 10/25/2014  . Hyperlipemia 10/25/2014  . BPH (benign prostatic hyperplasia) 10/25/2014    Past Surgical History:  Procedure Laterality Date  . CORONARY ARTERY BYPASS GRAFT N/A 07/20/2017   Procedure: CORONARY ARTERY BYPASS GRAFTING (CABG) times four using left internal mammary artery to LAD and endoscopically harvested right saphenous vein graft to distal circumflex, intermediate, and PD;  Surgeon: [name], MD;  Location: [facility] OR;  Service: Open Heart Surgery;  Laterality: N/A;  . EYE SURGERY Bilateral 2013  . FOOT SURGERY Bilateral    ingrown toenails  . GALLBLADDER SURGERY    . LEFT HEART CATH AND CORONARY ANGIOGRAPHY N/A  07/17/2017   Procedure: LEFT HEART CATH AND CORONARY ANGIOGRAPHY;  Surgeon: Yvonne Kendall, MD;  Location: [facility] INVASIVE CV LAB;  Service: Cardiovascular;  Laterality: N/A;  . SHOULDER SURGERY Right   . SPINE SURGERY      Prior to Admission medications   Medication Sig Start Date End Date Taking? Authorizing Provider  allopurinol (ZYLOPRIM) 300 MG tablet Take 1 tablet (300 mg total) by mouth daily. 12/16/17  Yes Crissman, Redge Gainer, MD  amiodarone (PACERONE) 100 MG tablet Take 100 mg by mouth daily.   Yes [provider]  aspirin EC 81 MG tablet Take 2 tablets (162 mg total) by mouth daily. 09/14/17  Yes Revankar, Aundra Dubin, MD  atorvastatin (LIPITOR) 80 MG tablet Take 1 tablet (80 mg total) by mouth daily at 6 PM. 12/16/17  Yes Crissman, Redge Gainer, MD  esomeprazole (NEXIUM) 40 MG capsule Take 1 capsule (40 mg total) by mouth daily before breakfast. 12/16/17  Yes Crissman, Redge Gainer, MD  fluticasone (FLONASE) 50 MCG/ACT nasal spray Place 2 sprays into both nostrils daily.   Yes [provider]  losartan (COZAAR) 25 MG tablet Take 1 tablet (25 mg total) by mouth daily. 12/16/17  Yes Crissman, Redge Gainer, MD  metoprolol tartrate (LOPRESSOR) 25 MG tablet Take 1 tablet (25 mg total) by mouth 2 (two) times daily. 12/16/17  Yes Crissman, Redge Gainer, MD  Multiple Vitamin (MULTIVITAMIN) tablet Take 1 tablet by mouth daily.   Yes [provider]  nitroGLYCERIN (NITROSTAT) 0.4 MG SL tablet Place  1 tablet (0.4 mg total) under the tongue every 5 (five) minutes as needed for chest pain. 09/14/17 01/02/18 Yes Revankar, Aundra Dubin, MD  oxyCODONE-acetaminophen (PERCOCET) 5-325 MG tablet Take 1 tablet by mouth every 4 (four) hours as needed for severe pain. 01/02/18 01/02/19  Arnaldo Natal, MD    Allergies Ace inhibitors; Codeine; Tape; and Naprosyn [naproxen]  Family History  Problem Relation Age of Onset  . Diabetes Mother   . Lung cancer Father   . Stroke Paternal Grandfather     Social  History Social History   Tobacco Use  . Smoking status: Never Smoker  . Smokeless tobacco: Never Used  Substance Use Topics  . Alcohol use: Yes    Comment: rarely  . Drug use: No    Review of Systems  Constitutional: No fever/chills Eyes: No visual changes. ENT: No sore throat. Cardiovascular: Denies chest pain. Respiratory: Denies shortness of breath. Gastrointestinal:  abdominal pain.  No nausea, no vomiting.  No diarrhea.  No constipation. Genitourinary: Negative for dysuria. Musculoskeletal: Negative for back pain. Skin: Negative for rash. Neurological: Negative for headaches, focal weakness   ____________________________________________   PHYSICAL EXAM:  VITAL SIGNS: ED Triage Vitals [01/02/18 1042]  Enc Vitals Group     BP (!) 137/98     Pulse Rate (!) 58     Resp 18     Temp 97.7 F (36.5 C)     Temp Source Oral     SpO2 98 %     Weight 215 lb 13.3 oz (97.9 kg)     Height      Head Circumference      Peak Flow      Pain Score 9     Pain Loc      Pain Edu?      Excl. in GC?     Constitutional: Alert and oriented.  She is sitting on the edge of the bed looks very uncomfortable Eyes: Conjunctivae are normal.  Head: Atraumatic. Nose: No congestion/rhinnorhea. Mouth/Throat: Mucous membranes are moist.  Oropharynx non-erythematous. Neck: No stridor. Cardiovascular: Normal rate, regular rhythm. Grossly normal heart sounds.  Good peripheral circulation. Respiratory: Normal respiratory effort.  No retractions. Lungs CTAB. Gastrointestinal: Soft and nontender! No distention. No abdominal bruits. No CVA tenderness. Musculoskeletal: No lower extremity tenderness nor edema.  Neurologic:  Normal speech and language. No gross focal neurologic deficits are appreciated.y. Skin:  Skin is warm, dry and intact. No rash noted. Psychiatric: Mood and affect are normal. Speech and behavior are normal.  ____________________________________________   LABS (all labs  ordered are listed, but only abnormal results are displayed)  Labs Reviewed  URINALYSIS, COMPLETE (UACMP) WITH MICROSCOPIC - Abnormal; Notable for the following components:      Result Value   Color, Urine YELLOW (*)    APPearance CLEAR (*)    Hgb urine dipstick LARGE (*)    RBC / HPF >50 (*)    All other components within normal limits  COMPREHENSIVE METABOLIC PANEL  LIPASE, BLOOD  CBC WITH DIFFERENTIAL/PLATELET   ____________________________________________  EKG   ____________________________________________  RADIOLOGY  ED MD interpretation: CT read by radiology reviewed by me shows a large stone in the left UVJ.  Radiology reads it is 5 mm.  Official radiology report(s): Ct Abdomen Pelvis W Contrast  Result Date: 01/02/2018 CLINICAL DATA:  Acute onset of left lower quadrant abdominal pain at 6:30 this morning. EXAM: CT ABDOMEN AND PELVIS WITH CONTRAST TECHNIQUE: Multidetector CT imaging of the abdomen and  pelvis was performed using the standard protocol following bolus administration of intravenous contrast. CONTRAST:  100mL OMNIPAQUE IOHEXOL 300 MG/ML  SOLN COMPARISON:  None available. FINDINGS: Lower chest: The lung bases are clear of an acute process. Minimal subsegmental atelectasis. No pleural effusion or worrisome pulmonary lesions. The heart is normal in size. No pericardial effusion. Age advanced three-vessel coronary artery calcifications are noted. Hepatobiliary: No focal hepatic lesions or intrahepatic biliary dilatation. Gallbladder is surgically absent. Mild associated common bile duct dilatation. Pancreas: No mass, inflammation or ductal dilatation. Spleen: Normal size.  No focal lesions. Adrenals/Urinary Tract: The adrenal glands and kidneys are unremarkable. No worrisome renal lesions or renal calculi. There is mild left-sided hydroureteronephrosis down to a 5 mm distal left ureteral calculus. No right-sided ureteral calculi. No bladder calculi. Stomach/Bowel: The  stomach, duodenum, small bowel and colon are grossly normal without oral contrast. No inflammatory changes, mass lesions or obstructive findings. The terminal ileum and appendix are normal. Vascular/Lymphatic: The aorta is normal in caliber. No dissection. The branch vessels are patent. The major venous structures are patent. No mesenteric or retroperitoneal mass or adenopathy. Small scattered lymph nodes are noted. Reproductive: The prostate gland and seminal vesicles are unremarkable. Mild prostate gland enlargement. Other: No pelvic mass or adenopathy. No free pelvic fluid collections. No inguinal mass or adenopathy. No abdominal wall hernia or subcutaneous lesions. Musculoskeletal: No significant bony findings. Moderate degenerative changes involving the spine. IMPRESSION: 1. 5 mm distal left ureteral calculus causing mild left-sided hydroureteronephrosis and explaining the patient's left lower quadrant pain. 2. No other acute or significant abdominal/pelvic findings. 3. Status post cholecystectomy with mild associated common bile duct dilatation. 4. Age advanced three-vessel coronary artery calcifications. Electronically Signed   By: Rudie MeyerP.  Gallerani M.D.   On: 01/02/2018 13:05    ____________________________________________   PROCEDURES  Procedure(s) performed:   Procedures  Critical Care performed:  ____________________________________________   INITIAL IMPRESSION / ASSESSMENT AND PLAN / ED COURSE  Patient's pain is controlled by the second injection.  He looks very comfortable.  I discussed with him the need for follow-up and what to do if the pain medicine I am giving him does not work.  He is comfortable with that and will go home.       ____________________________________________   FINAL CLINICAL IMPRESSION(S) / ED DIAGNOSES  Final diagnoses:  Renal colic on left side     ED Discharge Orders         Ordered    oxyCODONE-acetaminophen (PERCOCET) 5-325 MG tablet  Every 4  hours PRN     01/02/18 1431           Note:  This document was prepared using Dragon voice recognition software and may include unintentional dictation errors.    Arnaldo NatalMalinda, Neema Barreira F, MD 01/02/18 [phone]

## 2018-01-02 NOTE — ED Notes (Signed)
Pt taken to CT.

## 2018-01-02 NOTE — ED Notes (Signed)
Called to pt room, pt states severe nausea, increased pain and feeling hot.  Will notify Dr. Darnelle CatalanMalinda

## 2018-01-02 NOTE — ED Triage Notes (Signed)
Pt to ed with c/o acute onset of LLQ abd pain that am at 0630.  Pt reports pain radiates through to back, denies urinary symptoms.

## 2018-01-13 ENCOUNTER — Ambulatory Visit: Payer: Medicare Other | Admitting: Urology

## 2018-01-13 ENCOUNTER — Encounter: Payer: Self-pay | Admitting: Urology

## 2018-01-13 VITALS — BP 166/80 | HR 60 | Wt 215.0 lb

## 2018-01-13 DIAGNOSIS — N201 Calculus of ureter: Secondary | ICD-10-CM | POA: Diagnosis not present

## 2018-01-13 MED ORDER — TAMSULOSIN HCL 0.4 MG PO CAPS: 0.4000 mg | ORAL_CAPSULE | Freq: Every day | ORAL | 0 refills | Status: DC

## 2018-01-13 NOTE — Progress Notes (Signed)
01/13/2018 4:30 PM   Adriana MccallumLarry G Pagliuca Sep 10, 1950 [id number]  Referring provider: Steele Sizerrissman, Mark A, MD [address] [place], [place] [id number]  CC: Left 5mm distal ureteral stone  HPI: I saw Mr. Zanella in urology clinic today in consultation for a left 5 mm distal ureteral stone from Dr. Dossie Arbourrissman.  He is a 67 year old male with cardiac history who presented to the emergency department on 01/02/2018 with acute onset of left-sided flank pain.  He has never had kidney stones previously.  CT scan in the emergency department showed a 5 mm distal left ureteral calculus with mild left-sided hydronephrosis.  Urinalysis was not infected and he was discharged to home with oral pain control.  He reports that since that time he has not had any significant pain or nausea.  He has intermittently been straining his urine and has not caught a stone yet.  There are no aggravating or alleviating factors.  Severity is mild.  He denies fevers or chills.   PMH: Past Medical History:  Diagnosis Date  . Gout   . Hearing loss   . Hypertension   . Reflux     Surgical History: Past Surgical History:  Procedure Laterality Date  . CORONARY ARTERY BYPASS GRAFT N/A 07/20/2017   Procedure: CORONARY ARTERY BYPASS GRAFTING (CABG) times four using left internal mammary artery to LAD and endoscopically harvested right saphenous vein graft to distal circumflex, intermediate, and PD;  Surgeon: Delight OvensGerhardt, Edward B, MD;  [facility];  Service: Open Heart Surgery;  Laterality: N/A;  . EYE SURGERY Bilateral 2013  . FOOT SURGERY Bilateral    ingrown toenails  . GALLBLADDER SURGERY    . LEFT HEART CATH AND CORONARY ANGIOGRAPHY N/A 07/17/2017   Procedure: LEFT HEART CATH AND CORONARY ANGIOGRAPHY;  Surgeon: Yvonne KendallEnd, Christopher, MD;  Location: [facility] INVASIVE CV LAB;  Service: Cardiovascular;  Laterality: N/A;  . SHOULDER SURGERY Right   . SPINE SURGERY      Allergies:  Allergies  Allergen Reactions  . Ace Inhibitors     "cough"    . Codeine Other (See Comments)    Insomnia  . Tape   . Naprosyn [Naproxen] Rash    Family History: Family History  Problem Relation Age of Onset  . Diabetes Mother   . Lung cancer Father   . Stroke Paternal Grandfather     Social History:  reports that he has never smoked. He has never used smokeless tobacco. He reports current alcohol use. He reports that he does not use drugs.  ROS: Please see flowsheet from today's date for complete review of systems.  Physical Exam: BP (!) 166/80   Pulse 60   Wt 215 lb (97.5 kg)   BMI 30.85 kg/m    Constitutional:  Alert and oriented, No acute distress. Cardiovascular: No clubbing, cyanosis, or edema. Respiratory: Normal respiratory effort, no increased work of breathing. GI: Abdomen is soft, nontender, nondistended, no abdominal masses GU: No CVA tenderness DRE: Deferred Lymph: No cervical or inguinal lymphadenopathy. Skin: No rashes, bruises or suspicious lesions. Neurologic: Grossly intact, no focal deficits, moving all 4 extremities. Psychiatric: Normal mood and affect.   Pertinent Imaging: I have personally reviewed the CT stone protocol dated 01/02/2018.  5 mm left distal ureteral stone with upstream hydronephrosis.  Assessment & Plan:   In summary, Mr. Hulan Frayppel is a 67 year old male who presented to the emergency department 01/02/2017 with left-sided flank and groin pain and a 5 mm left distal ureteral stone on CT scan.  He has not had any pain over the last week, but has not seen a stone while he is intermittently strained his urine.    We discussed various treatment options for urolithiasis including observation with or without medical expulsive therapy, shockwave lithotripsy (SWL), ureteroscopy and laser lithotripsy with stent placement, and percutaneous nephrolithotomy.  We discussed that management is based on stone size, location, density, patient co-morbidities, and patient preference.   Stones <98mm in size have a >80%  spontaneous passage rate. Data surrounding the use of tamsulosin for medical expulsive therapy is controversial, but meta analyses suggests it is most efficacious for distal stones between 5-60mm in size. Possible side effects include dizziness/lightheadedness, and retrograde ejaculation.  SWL has a lower stone free rate in a single procedure, but also a lower complication rate compared to ureteroscopy and avoids a stent and associated stent related symptoms. Possible complications include renal hematoma, steinstrasse, and need for additional treatment.  Ureteroscopy with laser lithotripsy and stent placement has a higher stone free rate than SWL in a single procedure, however increased complication rate including possible infection, ureteral injury, bleeding, and stent related morbidity. Common stent related symptoms include dysuria, urgency/frequency, and flank pain.  After an extensive discussion of the risks and benefits of the above treatment options, the patient would like to proceed with medical expulsive therapy.  Tamsulosin prescribed Continue to strain urine Follow-up in 3 weeks with renal ultrasound and KUB, consider scheduling ureteroscopy if stone remains  Sondra Come, MD  [facility] [address] [place], [place] [address] [phone]

## 2018-01-29 ENCOUNTER — Telehealth: Payer: Self-pay | Admitting: Family Medicine

## 2018-01-29 NOTE — Telephone Encounter (Signed)
Needs note for jury duty- has kidney stone and scheduled for lithotripsy on 02/08/18

## 2018-02-08 ENCOUNTER — Ambulatory Visit
Admission: RE | Admit: 2018-02-08 | Discharge: 2018-02-08 | Disposition: A | Discharge status: Home / Self Care | Payer: Medicare Other | Source: Ambulatory Visit | Attending: Urology | Admitting: Urology

## 2018-02-08 DIAGNOSIS — N201 Calculus of ureter: Secondary | ICD-10-CM | POA: Diagnosis not present

## 2018-02-08 DIAGNOSIS — Z87442 Personal history of urinary calculi: Secondary | ICD-10-CM | POA: Diagnosis not present

## 2018-02-11 ENCOUNTER — Encounter: Payer: Self-pay | Admitting: Urology

## 2018-02-11 ENCOUNTER — Ambulatory Visit: Payer: Medicare Other | Admitting: Urology

## 2018-02-11 VITALS — BP 135/86 | HR 72 | Ht 70.0 in | Wt 213.0 lb

## 2018-02-11 DIAGNOSIS — N200 Calculus of kidney: Secondary | ICD-10-CM

## 2018-02-11 NOTE — Progress Notes (Signed)
   02/11/2018 3:04 PM   Thomas Miller 1950-10-31 [id number]  Reason for visit: Follow up 5 mm distal left ureteral stone  HPI: I saw Thomas Miller in follow-up for a left distal 5 mm ureteral stone.  This was diagnosed on a CT scan on 01/06/2018.  He has had no further left-sided flank pain or groin pain since that time.  He has intermittently strained his urine, but has not caught a stone.  He had a follow-up renal ultrasound and KUB this week which showed resolution of prior hydronephrosis, no evidence of a ureteral stone on x-ray.  He denies any gross hematuria, urgency, frequency, or dysuria.  He has a history of one prior kidney stone over 30 years ago.  ROS: Please see flowsheet from today's date for complete review of systems.  Physical Exam: BP 135/86 (BP Location: Left Arm, Patient Position: Sitting)   Pulse 72   Ht 5\' 10"  (1.778 m)   Wt 213 lb (96.6 kg)   BMI 30.56 kg/m    Constitutional:  Alert and oriented, No acute distress. Respiratory: Normal respiratory effort, no increased work of breathing. GI: Abdomen is soft, nontender, nondistended, no abdominal masses GU: No CVA tenderness Skin: No rashes, bruises or suspicious lesions. Neurologic: Grossly intact, no focal deficits, moving all 4 extremities. Psychiatric: Normal mood and affect  Pertinent Imaging: I have personally reviewed the renal ultrasound and KUB, no hydronephrosis, no evidence of any ureteral stones.  Assessment & Plan:   In summary, the patient is a [age] year old male with apparently spontaneously passed 5 mm distal left ureteral stone.  KUB and ultrasound are negative today for any evidence of stone, and his symptoms have completely resolved.  We discussed general stone prevention strategies including adequate hydration with goal of producing 2.5 L of urine daily, increasing citric acid intake, increasing calcium intake during high oxalate meals, minimizing animal protein, and decreasing salt intake.  Information about dietary recommendations given today.   RTC PRN If recurrent left flank pain, will get CT stone protocol  A total of 10 minutes were spent face-to-face with the patient, greater than 50% was spent in patient education, counseling, and coordination of care regarding nephrolithiasis, and prevention.   Sondra ComeBrian C Chancy Claros, MD  [facility] [address], [address] [place], [place] [address] [phone]

## 2018-03-10 ENCOUNTER — Ambulatory Visit: Payer: Medicare Other | Admitting: Cardiology

## 2018-03-10 ENCOUNTER — Encounter: Payer: Self-pay | Admitting: Cardiology

## 2018-03-10 VITALS — BP 140/80 | HR 58 | Ht 70.0 in | Wt 219.0 lb

## 2018-03-10 DIAGNOSIS — Z951 Presence of aortocoronary bypass graft: Secondary | ICD-10-CM

## 2018-03-10 DIAGNOSIS — I10 Essential (primary) hypertension: Secondary | ICD-10-CM | POA: Diagnosis not present

## 2018-03-10 DIAGNOSIS — I2510 Atherosclerotic heart disease of native coronary artery without angina pectoris: Secondary | ICD-10-CM

## 2018-03-10 DIAGNOSIS — E782 Mixed hyperlipidemia: Secondary | ICD-10-CM

## 2018-03-10 NOTE — Patient Instructions (Signed)

## 2018-03-10 NOTE — Progress Notes (Signed)
Cardiology Office Note:    Date:  03/10/2018   ID:  Thomas Miller, DOB [birth year], MRN [id number]  PCP:  Steele Sizerrissman, Mark A, MD  Cardiologist:  Garwin Brothersajan R Cressie Betzler, MD   Referring MD: Steele Sizerrissman, Mark A, MD    ASSESSMENT:    1. Coronary artery disease involving native coronary artery of native heart without angina pectoris   2. Mixed hyperlipidemia   3. Essential hypertension   4. S/P CABG x 4    PLAN:    In order of problems listed above:  1. Secondary prevention stressed with the patient.  Importance of compliance with diet and medication stressed and he vocalized understanding.  His blood pressure is stable and he has an element of whitecoat hypertension.  He will get blood work done in the next few days for lipids by his primary care physician.  He will continue his excellent exercise and secondary prevention 2. Patient will be seen in follow-up appointment in 6 months or earlier if the patient has any concerns    Medication Adjustments/Labs and Tests Ordered: Current medicines are reviewed at length with the patient today.  Concerns regarding medicines are outlined above.  No orders of the defined types were placed in this encounter.  No orders of the defined types were placed in this encounter.    Chief Complaint  Patient presents with  . Follow-up    No Concerns at this time.     History of Present Illness:    Thomas Miller is a 68 y.o. male.  Patient has known coronary artery disease.  He is post bypass surgery last year.  Sequently he has done fine.  No chest pain orthopnea or PND.  He is walking on a regular basis and exercising well.  His primary care physician checks his lipids and it was done in November and I saw the numbers in the record.  Past Medical History:  Diagnosis Date  . Gout   . Hearing loss   . Hypertension   . Kidney stones   . Reflux     Past Surgical History:  Procedure Laterality Date  . CORONARY ARTERY BYPASS GRAFT N/A 07/20/2017   Procedure: CORONARY ARTERY BYPASS GRAFTING (CABG) times four using left internal mammary artery to LAD and endoscopically harvested right saphenous vein graft to distal circumflex, intermediate, and PD;  Surgeon: Delight OvensGerhardt, Edward B, MD;  [facility];  Service: Open Heart Surgery;  Laterality: N/A;  . EYE SURGERY Bilateral 2013  . FOOT SURGERY Bilateral    ingrown toenails  . GALLBLADDER SURGERY    . LEFT HEART CATH AND CORONARY ANGIOGRAPHY N/A 07/17/2017   Procedure: LEFT HEART CATH AND CORONARY ANGIOGRAPHY;  Surgeon: Yvonne KendallEnd, Christopher, MD;  Location: [facility] INVASIVE CV LAB;  Service: Cardiovascular;  Laterality: N/A;  . SHOULDER SURGERY Right   . SPINE SURGERY      Current Medications: Current Meds  Medication Sig  . allopurinol (ZYLOPRIM) 300 MG tablet Take 1 tablet (300 mg total) by mouth daily.  [name]. aspirin EC 81 MG tablet Take 2 tablets (162 mg total) by mouth daily.  [name]. atorvastatin (LIPITOR) 80 MG tablet Take 1 tablet (80 mg total) by mouth daily at 6 PM.  . esomeprazole (NEXIUM) 40 MG capsule Take 1 capsule (40 mg total) by mouth daily before breakfast.  . losartan (COZAAR) 25 MG tablet Take 1 tablet (25 mg total) by mouth daily.  . metoprolol tartrate (LOPRESSOR) 25 MG tablet Take 1 tablet (25 mg total) by  mouth 2 (two) times daily.  . Multiple Vitamin (MULTIVITAMIN) tablet Take 1 tablet by mouth daily.  . nitroGLYCERIN (NITROSTAT) 0.4 MG SL tablet Place 1 tablet (0.4 mg total) under the tongue every 5 (five) minutes as needed for chest pain.  [name]. oxyCODONE-acetaminophen (PERCOCET) 5-325 MG tablet Take 1 tablet by mouth every 4 (four) hours as needed for severe pain.     Allergies:   Ace inhibitors; Codeine; Tape; and Naprosyn [naproxen]   Social History   Socioeconomic History  . Marital status: Married    Spouse name: Not on file  . Number of children: Not on file  . Years of education: Not on file  . Highest education level: Associate degree: occupational, Scientist, product/process developmenttechnical, or  vocational program  Occupational History  . Occupation: retired  Engineer, productionocial Needs  . Financial resource strain: Not hard at all  . Food insecurity:    Worry: Never true    Inability: Never true  . Transportation needs:    Medical: No    Non-medical: No  Tobacco Use  . Smoking status: Never Smoker  . Smokeless tobacco: Never Used  Substance and Sexual Activity  . Alcohol use: Yes    Comment: rarely  . Drug use: No  . Sexual activity: Not on file  Lifestyle  . Physical activity:    Days per week: 5 days    Minutes per session: 30 min  . Stress: Not at all  Relationships  . Social connections:    Talks on phone: More than three times a week    Gets together: More than three times a week    Attends religious service: More than 4 times per year    Active member of club or organization: No    Attends meetings of clubs or organizations: Never    Relationship status: Married  Other Topics Concern  . Not on file  Social History Narrative  . Not on file     Family History: The patient's family history includes Diabetes in his mother; Lung cancer in his father; Stroke in his paternal grandfather.  ROS:   Please see the history of present illness.    All other systems reviewed and are negative.  EKGs/Labs/Other Studies Reviewed:    The following studies were reviewed today: I discussed my findings with the patient at extensive length.   Recent Labs: 07/21/2017: Magnesium 2.5 12/16/2017: TSH 2.430 01/02/2018: ALT 25; BUN 20; Creatinine, Ser 1.17; Hemoglobin 16.3; Platelets 220; Potassium 4.2; Sodium 143  Recent Lipid Panel    Component Value Date/Time   CHOL 145 12/16/2017 1019   CHOL 142 06/03/2017 0811   TRIG 123 12/16/2017 1019   TRIG 123 06/03/2017 0811   HDL 46 12/16/2017 1019   CHOLHDL 3.2 12/16/2017 1019   CHOLHDL 3.0 07/18/2017 0249   VLDL 10 07/18/2017 0249   VLDL 25 06/03/2017 0811   LDLCALC 74 12/16/2017 1019    Physical Exam:    VS:  BP 140/80   Pulse  (!) 58   Ht 5\' 10"  (1.778 m)   Wt 219 lb (99.3 kg)   SpO2 97%   BMI 31.42 kg/m     Wt Readings from Last 3 Encounters:  03/10/18 219 lb (99.3 kg)  02/11/18 213 lb (96.6 kg)  01/13/18 215 lb (97.5 kg)     GEN: Patient is in no acute distress HEENT: Normal NECK: No JVD; No carotid bruits LYMPHATICS: No lymphadenopathy CARDIAC: Hear sounds regular, 2/6 systolic murmur at the apex. RESPIRATORY:  Clear to auscultation without rales, wheezing or rhonchi  ABDOMEN: Soft, non-tender, non-distended MUSCULOSKELETAL:  No edema; No deformity  SKIN: Warm and dry NEUROLOGIC:  Alert and oriented x 3 PSYCHIATRIC:  Normal affect   Signed, Garwin Brothers, MD  03/10/2018 10:22 AM    [facility]

## 2018-03-18 DIAGNOSIS — M5416 Radiculopathy, lumbar region: Secondary | ICD-10-CM | POA: Diagnosis not present

## 2018-03-18 DIAGNOSIS — I10 Essential (primary) hypertension: Secondary | ICD-10-CM | POA: Diagnosis not present

## 2018-04-01 DIAGNOSIS — M5416 Radiculopathy, lumbar region: Secondary | ICD-10-CM | POA: Diagnosis not present

## 2018-04-01 DIAGNOSIS — I10 Essential (primary) hypertension: Secondary | ICD-10-CM | POA: Diagnosis not present

## 2018-04-14 DIAGNOSIS — M5416 Radiculopathy, lumbar region: Secondary | ICD-10-CM | POA: Diagnosis not present

## 2018-04-20 DIAGNOSIS — M48061 Spinal stenosis, lumbar region without neurogenic claudication: Secondary | ICD-10-CM | POA: Diagnosis not present

## 2018-04-20 DIAGNOSIS — M5416 Radiculopathy, lumbar region: Secondary | ICD-10-CM | POA: Diagnosis not present

## 2018-05-06 DIAGNOSIS — M5416 Radiculopathy, lumbar region: Secondary | ICD-10-CM | POA: Diagnosis not present

## 2018-05-13 DIAGNOSIS — M5416 Radiculopathy, lumbar region: Secondary | ICD-10-CM | POA: Diagnosis not present

## 2018-05-13 DIAGNOSIS — G5731 Lesion of lateral popliteal nerve, right lower limb: Secondary | ICD-10-CM | POA: Diagnosis not present

## 2018-05-27 DIAGNOSIS — M5416 Radiculopathy, lumbar region: Secondary | ICD-10-CM | POA: Diagnosis not present

## 2018-05-28 DIAGNOSIS — Z92241 Personal history of systemic steroid therapy: Secondary | ICD-10-CM | POA: Insufficient documentation

## 2018-05-28 HISTORY — DX: Personal history of systemic steroid therapy: Z92.241

## 2018-05-28 HISTORY — PX: EPIDURAL BLOCK INJECTION: SHX1516

## 2018-06-03 ENCOUNTER — Telehealth: Payer: Self-pay | Admitting: Physical Medicine and Rehabilitation

## 2018-06-03 NOTE — Telephone Encounter (Signed)
Received referral from  Dr. Yetta Barre at [facility]. Per Dr. Alvester Morin, ok to schedule right L3 TF. I called the patient and left a message for him to call back to schedule. Referral in folder.

## 2018-06-30 ENCOUNTER — Ambulatory Visit (INDEPENDENT_AMBULATORY_CARE_PROVIDER_SITE_OTHER): Payer: Medicare Other | Admitting: Family Medicine

## 2018-06-30 ENCOUNTER — Other Ambulatory Visit: Payer: Self-pay

## 2018-06-30 ENCOUNTER — Encounter: Payer: Self-pay | Admitting: Family Medicine

## 2018-06-30 DIAGNOSIS — K219 Gastro-esophageal reflux disease without esophagitis: Secondary | ICD-10-CM | POA: Diagnosis not present

## 2018-06-30 DIAGNOSIS — I10 Essential (primary) hypertension: Secondary | ICD-10-CM | POA: Diagnosis not present

## 2018-06-30 DIAGNOSIS — I2510 Atherosclerotic heart disease of native coronary artery without angina pectoris: Secondary | ICD-10-CM

## 2018-06-30 DIAGNOSIS — I480 Paroxysmal atrial fibrillation: Secondary | ICD-10-CM

## 2018-06-30 DIAGNOSIS — E782 Mixed hyperlipidemia: Secondary | ICD-10-CM

## 2018-06-30 NOTE — Assessment & Plan Note (Signed)
The current medical regimen is effective;  continue present plan and medications.  

## 2018-06-30 NOTE — Progress Notes (Signed)
BP 119/68   Pulse (!) 56   Wt 210 lb (95.3 kg)   BMI 30.13 kg/m    Subjective:    Patient ID: Thomas Miller, male    DOB: 16-Sep-1950, 68 y.o.   MRN: [id number]  HPI: Thomas Miller is a 68 y.o. male  Med check   Telemedicine using audio/video telecommunications for a synchronous communication visit. Today's visit due to COVID-19 isolation precautions I connected with and verified that I am speaking with the correct person using two identifiers.   I discussed the limitations, risks, security and privacy concerns of performing an evaluation and management service by telecommunication and the availability of in person appointments. I also discussed with the patient that there may be a patient responsible charge related to this service. The patient expressed understanding and agreed to proceed. The patient's location is home. I am at home. Discussed with patient exercising and doing well.  His wife is getting worse with dementia. Taking blood pressure cholesterol gout medicines without any problems also reflux is doing well good notes from cardiology.  Had kidney stone this January and CT scan patient had some concerns about which was reviewed with patient with no new her changes noted on CT scan which were reviewed with patient.  Relevant past medical, surgical, family and social history reviewed and updated as indicated. Interim medical history since our last visit reviewed. Allergies and medications reviewed and updated.  Review of Systems  Constitutional: Negative.   Respiratory: Negative.   Cardiovascular: Negative.     Per HPI unless specifically indicated above     Objective:    BP 119/68   Pulse (!) 56   Wt 210 lb (95.3 kg)   BMI 30.13 kg/m   Wt Readings from Last 3 Encounters:  06/30/18 210 lb (95.3 kg)  03/10/18 219 lb (99.3 kg)  02/11/18 213 lb (96.6 kg)    Physical Exam  Results for orders placed or performed during the hospital encounter of 01/02/18   Comprehensive metabolic panel  Result Value Ref Range   Sodium 143 135 - 145 mmol/L   Potassium 4.2 3.5 - 5.1 mmol/L   Chloride 108 98 - 111 mmol/L   CO2 25 22 - 32 mmol/L   Glucose, Bld 92 70 - 99 mg/dL   BUN 20 8 - 23 mg/dL   Creatinine, Ser 7.821.17 0.61 - 1.24 mg/dL   Calcium 9.6 8.9 - 95.610.3 mg/dL   Total Protein 7.5 6.5 - 8.1 g/dL   Albumin 4.8 3.5 - 5.0 g/dL   AST 27 15 - 41 U/L   ALT 25 0 - 44 U/L   Alkaline Phosphatase 89 38 - 126 U/L   Total Bilirubin 1.0 0.3 - 1.2 mg/dL   GFR calc non Af Amer >60 >60 mL/min   GFR calc Af Amer >60 >60 mL/min   Anion gap 10 5 - 15  Lipase, blood  Result Value Ref Range   Lipase 36 11 - 51 U/L  CBC with Differential  Result Value Ref Range   WBC 8.3 4.0 - 10.5 K/uL   RBC 5.74 4.22 - 5.81 MIL/uL   Hemoglobin 16.3 13.0 - 17.0 g/dL   HCT 21.350.8 08.639.0 - 57.852.0 %   MCV 88.5 80.0 - 100.0 fL   MCH 28.4 26.0 - 34.0 pg   MCHC 32.1 30.0 - 36.0 g/dL   RDW 46.914.5 62.911.5 - 52.815.5 %   Platelets 220 150 - 400 K/uL   nRBC 0.0 0.0 -  0.2 %   Neutrophils Relative % 56 %   Neutro Abs 4.6 1.7 - 7.7 K/uL   Lymphocytes Relative 31 %   Lymphs Abs 2.6 0.7 - 4.0 K/uL   Monocytes Relative 9 %   Monocytes Absolute 0.8 0.1 - 1.0 K/uL   Eosinophils Relative 2 %   Eosinophils Absolute 0.2 0.0 - 0.5 K/uL   Basophils Relative 1 %   Basophils Absolute 0.1 0.0 - 0.1 K/uL   Immature Granulocytes 1 %   Abs Immature Granulocytes 0.06 0.00 - 0.07 K/uL  Urinalysis, Complete w Microscopic  Result Value Ref Range   Color, Urine YELLOW (A) YELLOW   APPearance CLEAR (A) CLEAR   Specific Gravity, Urine 1.023 1.005 - 1.030   pH 5.0 5.0 - 8.0   Glucose, UA NEGATIVE NEGATIVE mg/dL   Hgb urine dipstick LARGE (A) NEGATIVE   Bilirubin Urine NEGATIVE NEGATIVE   Ketones, ur NEGATIVE NEGATIVE mg/dL   Protein, ur NEGATIVE NEGATIVE mg/dL   Nitrite NEGATIVE NEGATIVE   Leukocytes, UA NEGATIVE NEGATIVE   RBC / HPF >50 (H) 0 - 5 RBC/hpf   WBC, UA 0-5 0 - 5 WBC/hpf   Bacteria, UA NONE SEEN  NONE SEEN   Squamous Epithelial / LPF NONE SEEN 0 - 5   Mucus PRESENT       Assessment & Plan:   Problem List Items Addressed This Visit    None     I discussed the assessment and treatment plan with the patient. The patient was provided an opportunity to ask questions and all were answered. The patient agreed with the plan and demonstrated an understanding of the instructions.   The patient was advised to call back or seek an in-person evaluation if the symptoms worsen or if the condition fails to improve as anticipated.   I provided 21+ minutes of time during this encounter.  Follow up plan: No follow-ups on file.

## 2018-07-05 ENCOUNTER — Ambulatory Visit (INDEPENDENT_AMBULATORY_CARE_PROVIDER_SITE_OTHER): Payer: Medicare Other | Admitting: Physical Medicine and Rehabilitation

## 2018-07-05 ENCOUNTER — Ambulatory Visit: Payer: Self-pay

## 2018-07-05 ENCOUNTER — Other Ambulatory Visit: Payer: Self-pay

## 2018-07-05 ENCOUNTER — Encounter: Payer: Self-pay | Admitting: Physical Medicine and Rehabilitation

## 2018-07-05 VITALS — BP 158/100 | HR 58

## 2018-07-05 DIAGNOSIS — M5416 Radiculopathy, lumbar region: Secondary | ICD-10-CM | POA: Diagnosis not present

## 2018-07-05 MED ORDER — BETAMETHASONE SOD PHOS & ACET 6 (3-3) MG/ML IJ SUSP
12.0000 mg | Freq: Once | INTRAMUSCULAR | Status: AC
  Administered 2018-07-05: 12 mg

## 2018-07-05 NOTE — Procedures (Signed)
Lumbosacral Transforaminal Epidural Steroid Injection - Sub-Pedicular Approach with Fluoroscopic Guidance  Patient: Thomas Miller      Date of Birth: 1950-10-16 MRN: [id number] PCP: Guadalupe Maple, MD      Visit Date: 07/05/2018   Universal Protocol:    Date/Time: 07/05/2018  Consent Given By: the patient  Position: PRONE  Additional Comments: Vital signs were monitored before and after the procedure. Patient was prepped and draped in the usual sterile fashion. The correct patient, procedure, and site was verified.   Injection Procedure Details:  Procedure Site One Meds Administered:  Meds ordered this encounter  Medications  . betamethasone acetate-betamethasone sodium phosphate (CELESTONE) injection 12 mg    Laterality: Right  Location/Site:  L3-L4  Needle size: 22 G  Needle type: Spinal  Needle Placement: Transforaminal  Findings:    -Comments: Excellent flow of contrast along the nerve and into the epidural space.  Procedure Details: After squaring off the end-plates to get a true AP view, the C-arm was positioned so that an oblique view of the foramen as noted above was visualized. The target area is just inferior to the "nose of the scotty dog" or sub pedicular. The soft tissues overlying this structure were infiltrated with 2-3 ml. of 1% Lidocaine without Epinephrine.  The spinal needle was inserted toward the target using a "trajectory" view along the fluoroscope beam.  Under AP and lateral visualization, the needle was advanced so it did not puncture dura and was located close the 6 O'Clock position of the pedical in AP tracterory. Biplanar projections were used to confirm position. Aspiration was confirmed to be negative for CSF and/or blood. A 1-2 ml. volume of Isovue-250 was injected and flow of contrast was noted at each level. Radiographs were obtained for documentation purposes.   After attaining the desired flow of contrast documented above, a 0.5 to  1.0 ml test dose of 0.25% Marcaine was injected into each respective transforaminal space.  The patient was observed for 90 seconds post injection.  After no sensory deficits were reported, and normal lower extremity motor function was noted,   the above injectate was administered so that equal amounts of the injectate were placed at each foramen (level) into the transforaminal epidural space.   Additional Comments:  The patient tolerated the procedure well Dressing: 2 x 2 sterile gauze and Band-Aid    Post-procedure details: Patient was observed during the procedure. Post-procedure instructions were reviewed.  Patient left the clinic in stable condition.

## 2018-07-05 NOTE — Progress Notes (Signed)
Numeric Pain Rating Scale and Functional Assessment Average Pain 9   In the last MONTH (on 0-10 scale) has pain interfered with the following?  1. General activity like being  able to carry out your everyday physical activities such as walking, climbing stairs, carrying groceries, or moving a chair?  Rating(7)   +Driver, -BT, -Dye Allergies. 

## 2018-07-05 NOTE — Progress Notes (Signed)
Thomas MccallumLarry G Miller - 68 y.o. male MRN [id number]  Date of birth: [birth year]  Office Visit Note: Visit Date: 07/05/2018 PCP: Steele Sizerrissman, Mark A, MD Referred by: Steele Sizerrissman, Mark A, MD  Subjective: Chief Complaint  Patient presents with  . Lower Back - Pain   HPI:  Thomas Miller is a 68 y.o. male who comes in today At the request of Dr. [name] for right L3 transforaminal epidural steroid injection.  He is status post L3-4 laminectomy discectomy by Dr. Yetta Miller.  We have seen the patient in the past for injections prior to his surgery.  He has having right low back and hip pain  ROS Otherwise per HPI.  Assessment & Plan: Visit Diagnoses:  1. Lumbar radiculopathy     Plan: No additional findings.   Meds & Orders:  Meds ordered this encounter  Medications  . betamethasone acetate-betamethasone sodium phosphate (CELESTONE) injection 12 mg    Orders Placed This Encounter  Procedures  . XR C-ARM NO REPORT  . Epidural Steroid injection    Follow-up: Return if symptoms worsen or fail to improve, for [name], MD.   Procedures: No procedures performed  Lumbosacral Transforaminal Epidural Steroid Injection - Sub-Pedicular Approach with Fluoroscopic Guidance  Patient: Thomas KavaLarry G Michie      Date of Birth: [birth year] MRN: [id number] PCP: Steele Sizerrissman, Mark A, MD      Visit Date: 07/05/2018   Universal Protocol:    Date/Time: 07/05/2018  Consent Given By: the patient  Position: PRONE  Additional Comments: Vital signs were monitored before and after the procedure. Patient was prepped and draped in the usual sterile fashion. The correct patient, procedure, and site was verified.   Injection Procedure Details:  Procedure Site One Meds Administered:  Meds ordered this encounter  Medications  . betamethasone acetate-betamethasone sodium phosphate (CELESTONE) injection 12 mg    Laterality: Right  Location/Site:  L3-L4  Needle size: 22 G  Needle type: Spinal  Needle Placement:  Transforaminal  Findings:    -Comments: Excellent flow of contrast along the nerve and into the epidural space.  Procedure Details: After squaring off the end-plates to get a true AP view, the C-arm was positioned so that an oblique view of the foramen as noted above was visualized. The target area is just inferior to the "nose of the scotty dog" or sub pedicular. The soft tissues overlying this structure were infiltrated with 2-3 ml. of 1% Lidocaine without Epinephrine.  The spinal needle was inserted toward the target using a "trajectory" view along the fluoroscope beam.  Under AP and lateral visualization, the needle was advanced so it did not puncture dura and was located close the 6 O'Clock position of the pedical in AP tracterory. Biplanar projections were used to confirm position. Aspiration was confirmed to be negative for CSF and/or blood. A 1-2 ml. volume of Isovue-250 was injected and flow of contrast was noted at each level. Radiographs were obtained for documentation purposes.   After attaining the desired flow of contrast documented above, a 0.5 to 1.0 ml test dose of 0.25% Marcaine was injected into each respective transforaminal space.  The patient was observed for 90 seconds post injection.  After no sensory deficits were reported, and normal lower extremity motor function was noted,   the above injectate was administered so that equal amounts of the injectate were placed at each foramen (level) into the transforaminal epidural space.   Additional Comments:  The patient tolerated the procedure well Dressing: 2 x  2 sterile gauze and Band-Aid    Post-procedure details: Patient was observed during the procedure. Post-procedure instructions were reviewed.  Patient left the clinic in stable condition.    Clinical History: MRI LUMBAR SPINE WITHOUT CONTRAST  TECHNIQUE: Multiplanar, multisequence MR imaging of the lumbar spine was performed. No intravenous contrast was  administered.  COMPARISON:  Lumbar spine radiographs 10/23/2016  FINDINGS: Segmentation:  Standard.  Alignment: Minimal left convex curvature of the lower lumbar spine. Trace retrolisthesis of L3 on L4.  Vertebrae: Small Schmorl's nodes involving the T12, L4, and L5 endplates. No fracture or suspicious osseous lesion. Asymmetric right-sided type 2 degenerative endplate changes at V8-9.  Conus medullaris and cauda equina: Conus extends to the L1-2 level. Conus and cauda equina appear normal.  Paraspinal and other soft tissues: Unremarkable.  Disc levels:  Disc desiccation throughout the lumbar spine, greatest at L4-5. Mild disc space narrowing at L4-5 greater than L3-4.  T12-L1:  Negative.  L1-2:  Slight facet hypertrophy without disc herniation or stenosis.  L2-3:  Mild disc bulging and facet hypertrophy without stenosis.  L3-4: Circumferential disc bulging, moderate-sized disc protrusion greatest in the left central region, and moderate facet and ligamentum flavum hypertrophy result in severe spinal stenosis, severe left greater than right lateral recess stenosis, and moderate left greater than right neural foraminal stenosis. Potential bilateral L4 and left L3 nerve root impingement.  L4-5: Circumferential disc bulging greater to the right and moderate right greater than left facet hypertrophy result in mild right lateral recess and moderate right neural foraminal stenosis without spinal stenosis.  L5-S1: Shallow right central disc protrusion and moderate right and mild-to-moderate left facet arthrosis without stenosis.  IMPRESSION: 1. L3-4 disc protrusion contributing to severe multifactorial spinal stenosis. 2. Moderate neural foraminal stenosis bilaterally at L3-4 and on the right at L4-5.   Electronically Signed   By: [name] M.D.   On: 12/22/2016 08:09     Objective:  VS:  HT:    WT:   BMI:     BP:(!) 158/100  HR:(!) 58bpm   TEMP: ( )  RESP:  Physical Exam  Ortho Exam Imaging: No results found.

## 2018-07-08 ENCOUNTER — Telehealth: Payer: Self-pay | Admitting: Family Medicine

## 2018-07-08 NOTE — Chronic Care Management (AMB) (Signed)
°  Chronic Care Management   Outreach Note  07/08/2018 Name: Thomas Miller MRN: [id number] DOB: 05-Jun-1950  Referred by: Guadalupe Maple, MD Reason for referral : No chief complaint on file.   An unsuccessful telephone outreach was attempted today. The patient was referred to the case management team by for assistance with chronic care management and care coordination.   Follow Up Plan: The care management team will reach out to the patient again.  Linden  [email or web address]   ??[phone]

## 2018-07-08 NOTE — Chronic Care Management (AMB) (Signed)
Chronic Care Management   Note  07/08/2018 Name: ANKITH EDMONSTON MRN: [id number] DOB: [birth year]  Griffon Herberg Nusz is a 68 y.o. year old male who is a primary care patient of Crissman, Jeannette How, MD. I reached out to Ria Bush by phone today in response to a referral sent by Mr. Kiel Cockerell Glodowski's health plan.    Mr. Mcgrail was given information about Chronic Care Management services today including:  1. CCM service includes personalized support from designated clinical staff supervised by his physician, including individualized plan of care and coordination with other care providers 2. 24/7 contact phone numbers for assistance for urgent and routine care needs. 3. Service will only be billed when office clinical staff spend 20 minutes or more in a month to coordinate care. 4. Only one practitioner may furnish and bill the service in a calendar month. 5. The patient may stop CCM services at any time (effective at the end of the month) by phone call to the office staff. 6. The patient will be responsible for cost sharing (co-pay) of up to 20% of the service fee (after annual deductible is met).  Patient agreed to services and verbal consent obtained.   Follow up plan: Telephone appointment with CCM team member scheduled for: 07/21/2018  [place]  [email or web address]   ??[phone]

## 2018-07-11 IMAGING — CR DG CHEST 2V
3 series · 3 of 3 positions shown · non-contrast
Comparison: Chest x-ray dated July 06, 2017.

CLINICAL DATA: Chest pain and shortness of breath last night, now
resolved.

EXAM:
CHEST - 2 VIEW

[chest lat (1 of 2)]
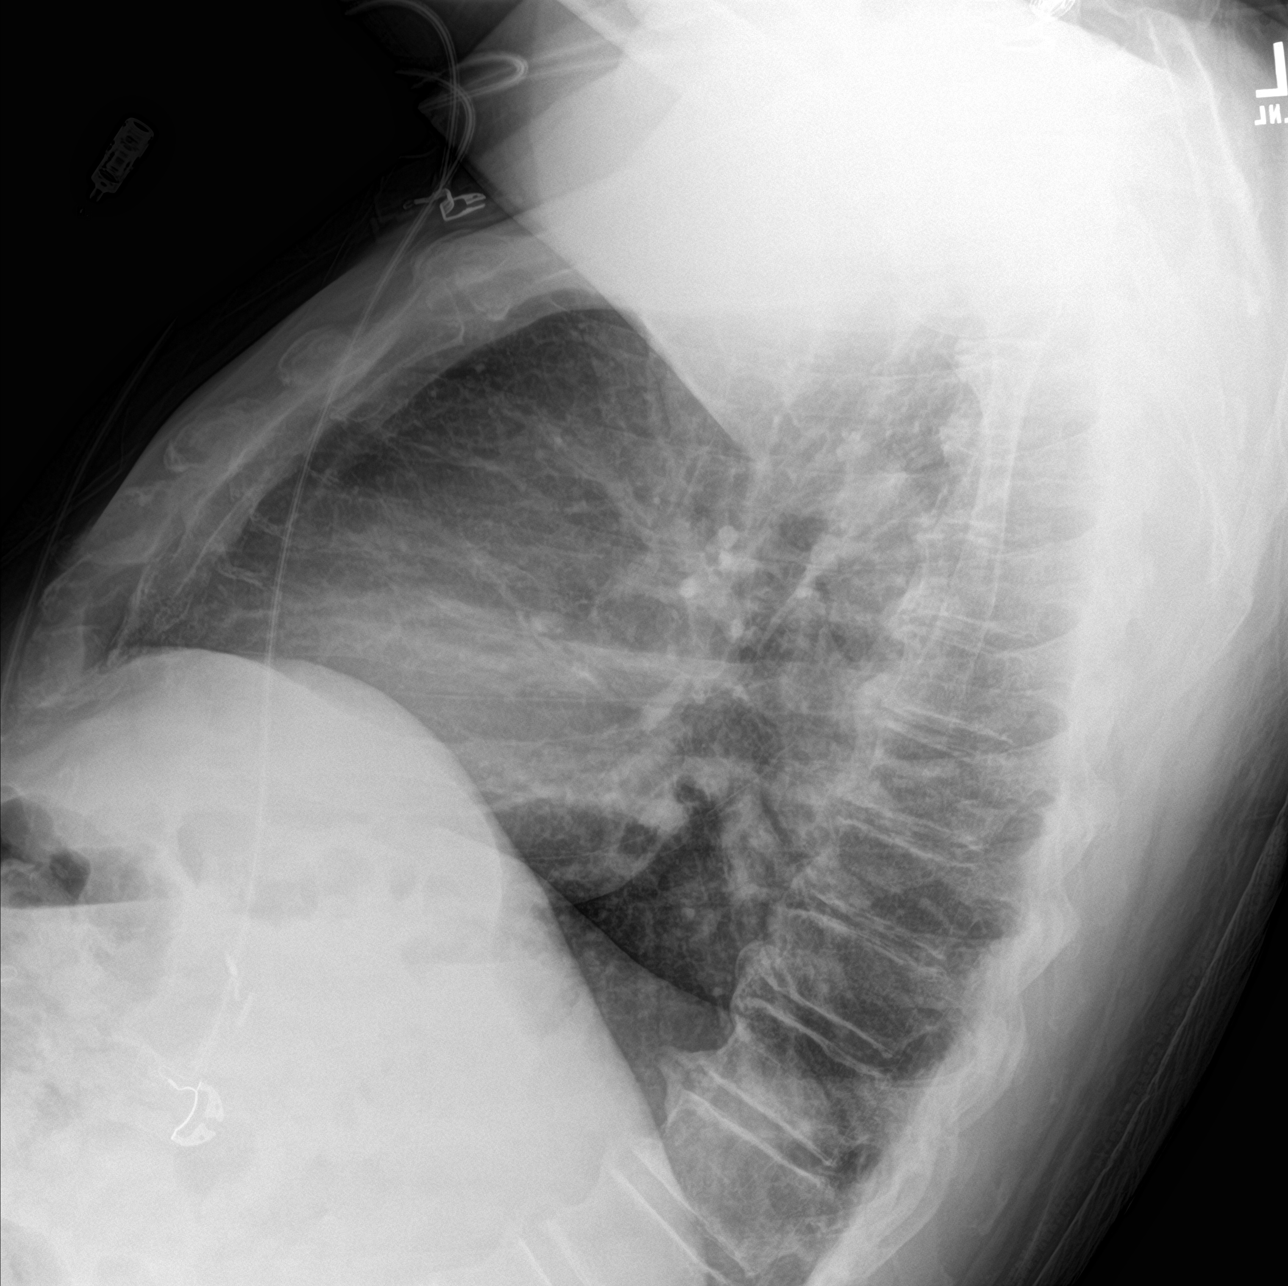

[chest ap]
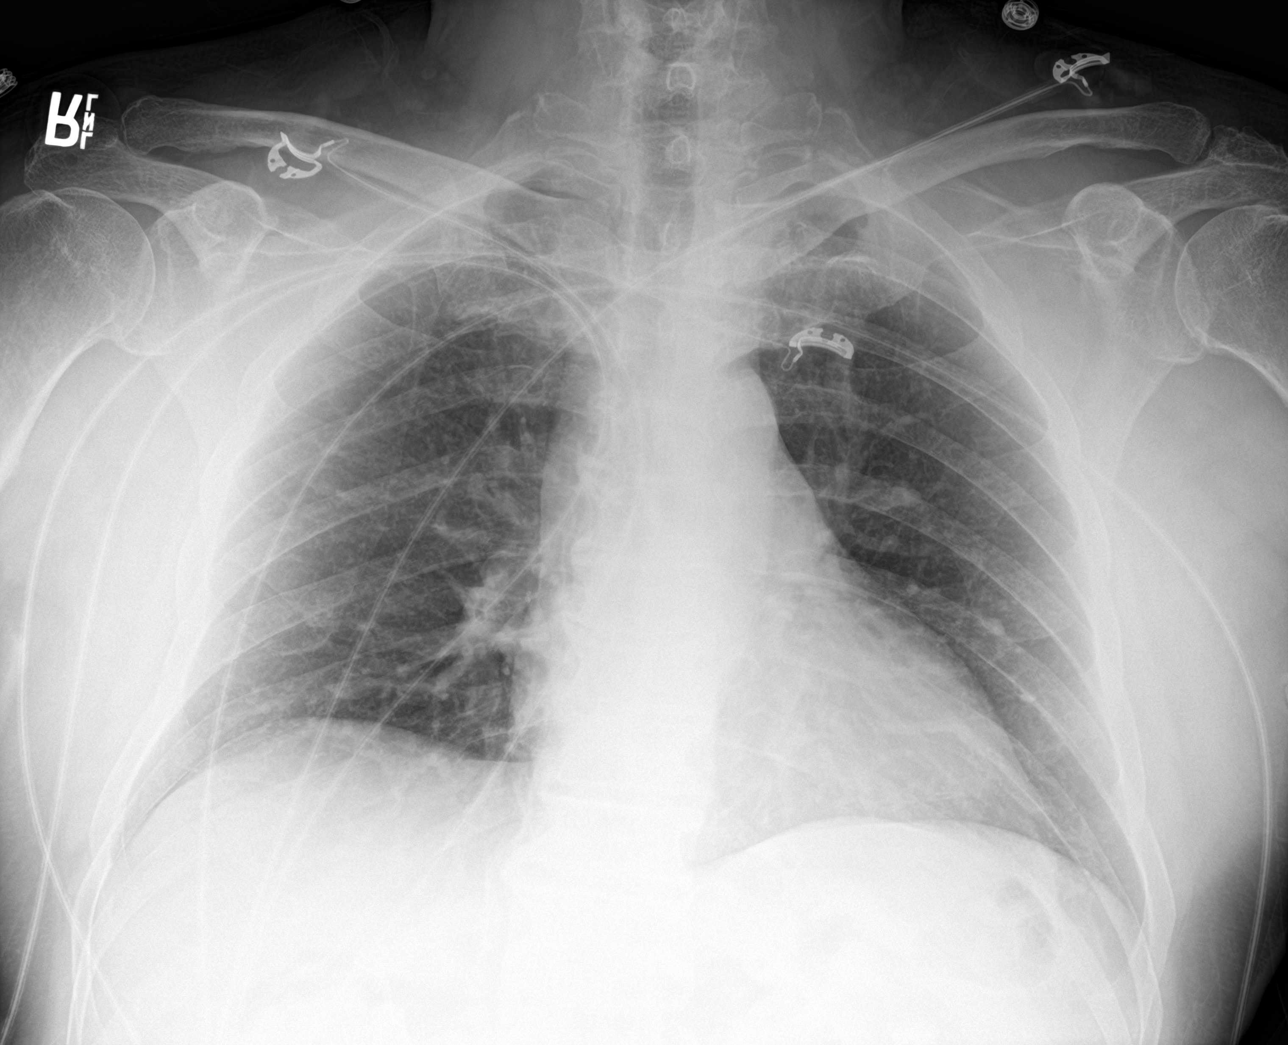

[chest lat (2 of 2)]
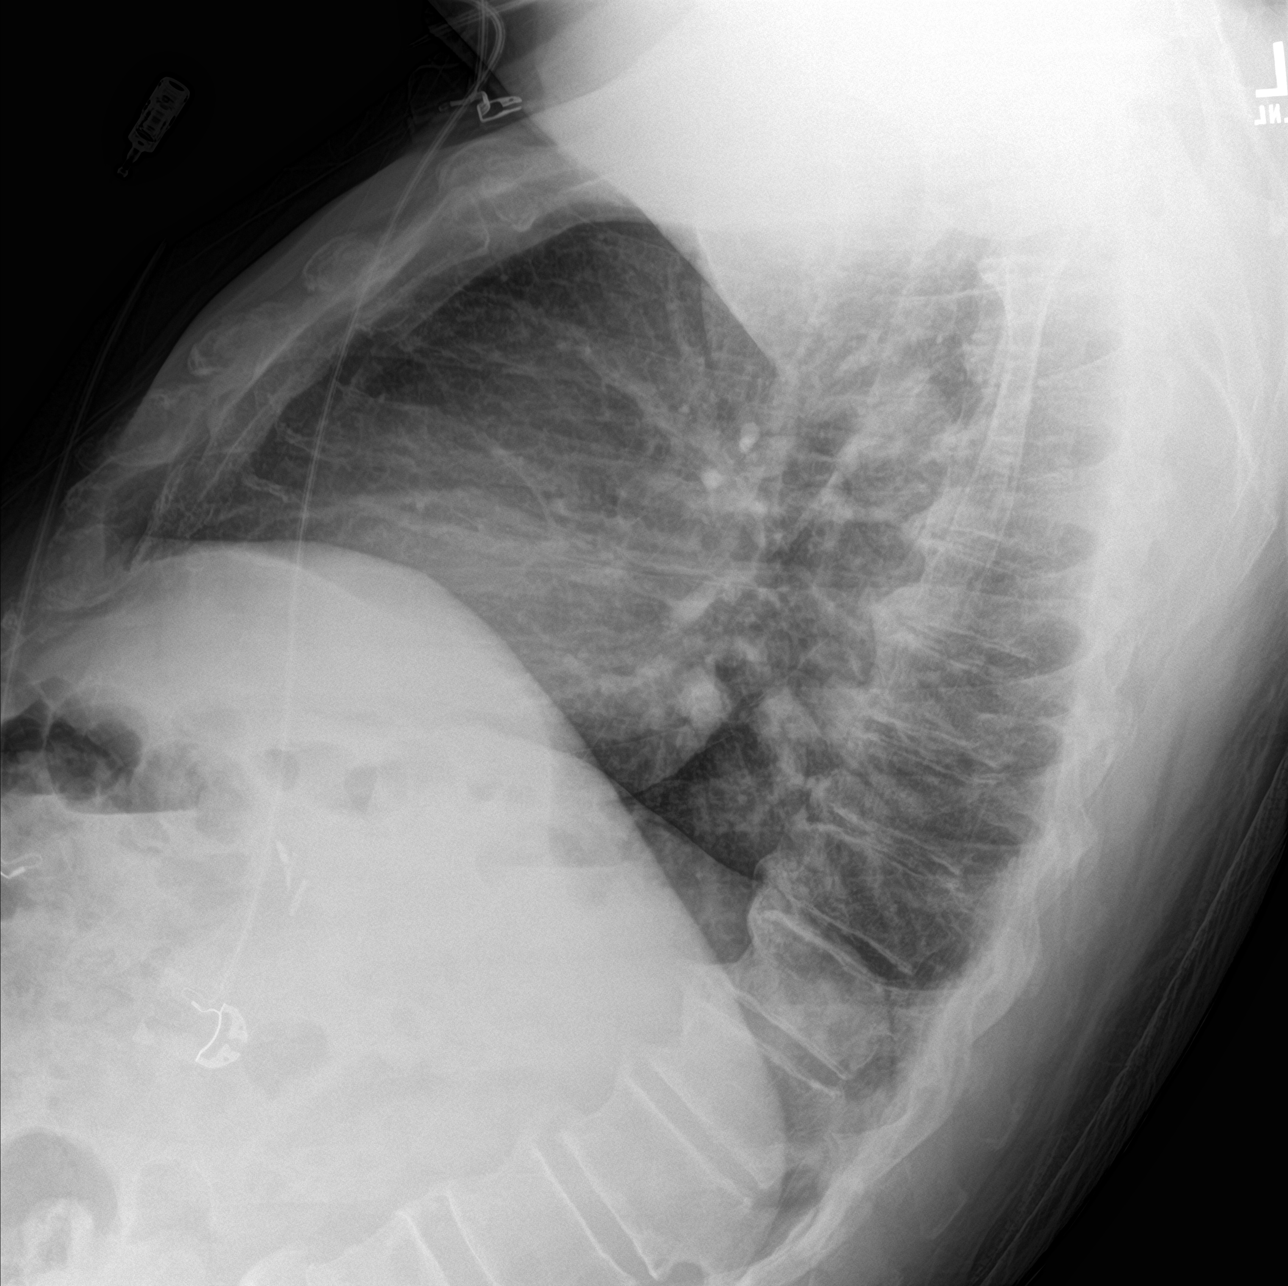

[3 of 3 positions shown; findings below may reference images not displayed]

FINDINGS: The heart size and mediastinal contours are within normal limits.
Both lungs are clear. The visualized skeletal structures are
unremarkable.
IMPRESSION: No active cardiopulmonary disease.

## 2018-07-13 IMAGING — CR DG CHEST 1V PORT
1 series · 1 of 1 positions shown · non-contrast
Comparison: Chest x-ray dated 07/18/2017.

CLINICAL DATA: Status post CABG x4.

EXAM:
PORTABLE CHEST 1 VIEW

[AP]
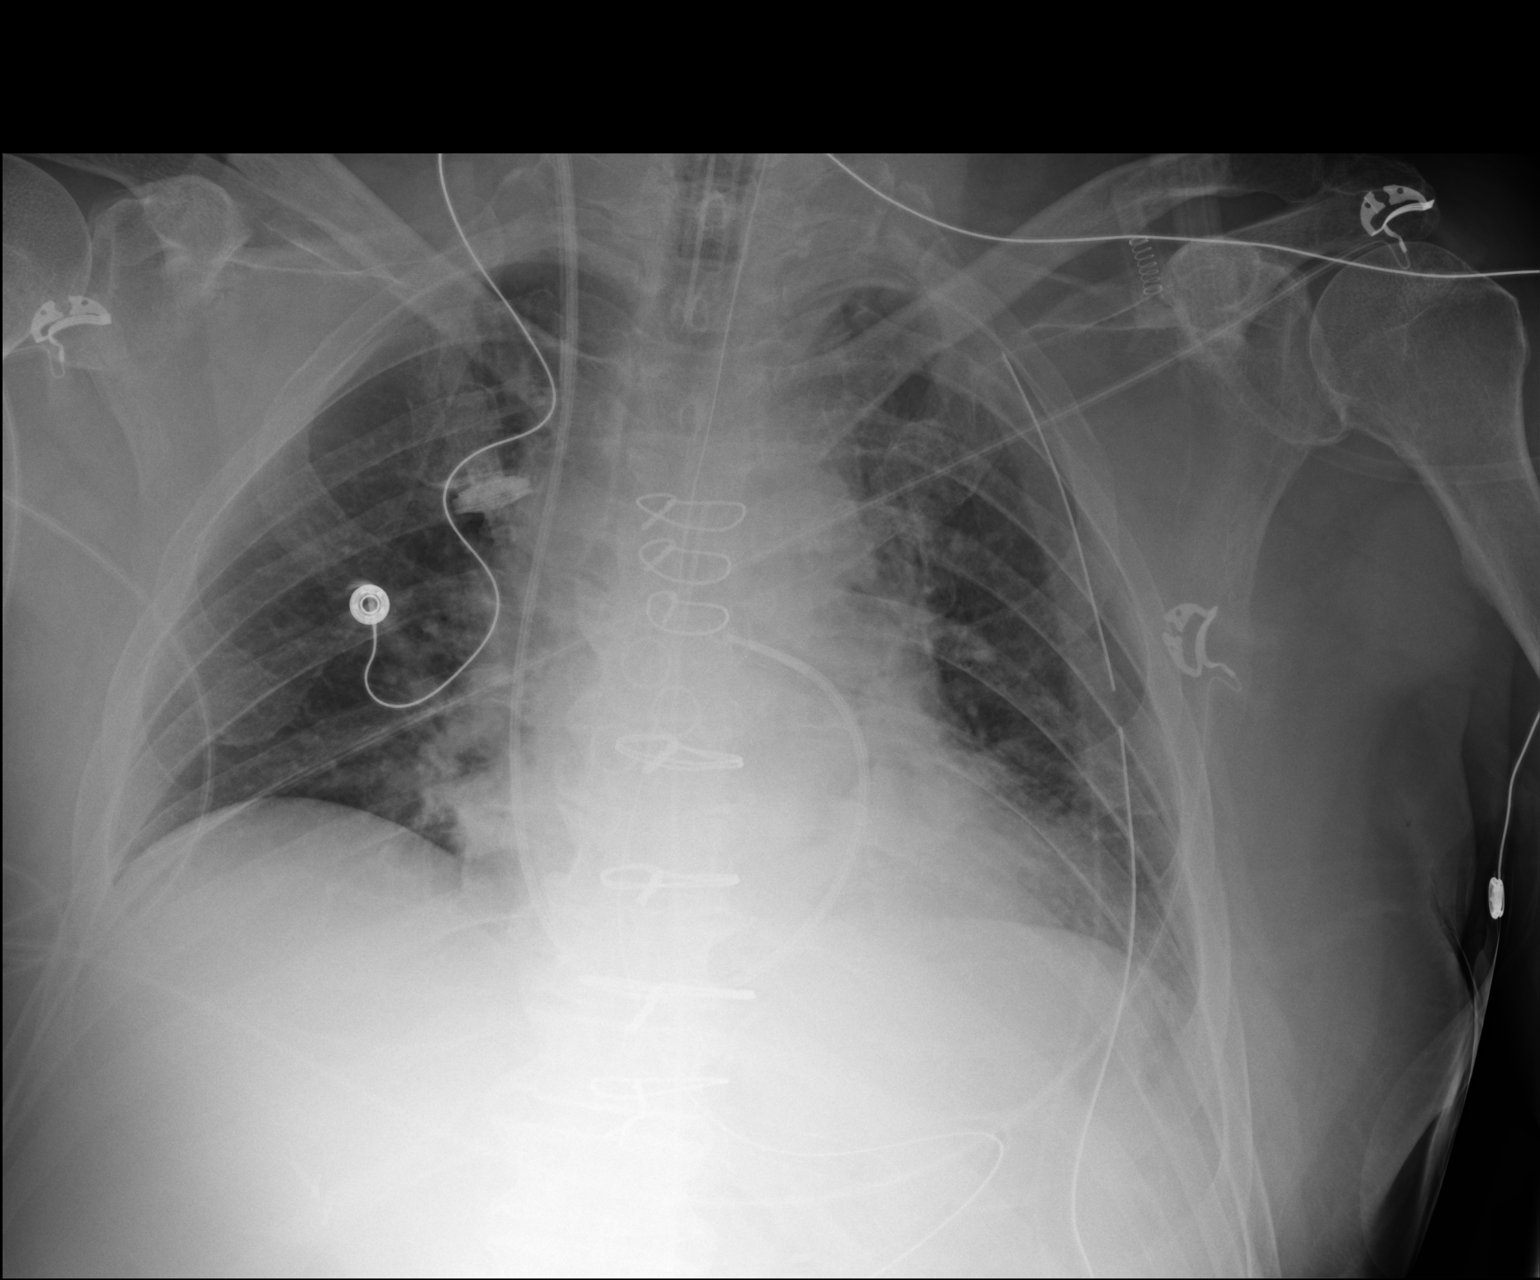

[1 of 1 positions shown; findings below may reference images not displayed]

FINDINGS: Endotracheal tube tip is above the level of the clavicles,
approximately 6 cm above the level of the carina.

Enteric tube passes below the diaphragm. Swan Ganz catheter appears
appropriately positioned with tip in the midline. LEFT-sided chest
tube in place with tip directed towards the lung apex.

Mild central pulmonary vascular congestion without overt alveolar
pulmonary edema. No pleural effusion or pneumothorax seen.
IMPRESSION: 1. Endotracheal tube tip is above the level of the clavicles and
approximately 6 cm above the level of the carina.
2. Remainder of the support apparatus detailed above.
3. Mild central pulmonary vascular congestion. No pleural effusion
or pneumothorax seen.

## 2018-07-14 IMAGING — DX DG CHEST 1V PORT
1 series · 1 of 1 positions shown · non-contrast
Comparison: 07/20/2017

CLINICAL DATA: Follow-up chest tube placement

EXAM:
PORTABLE CHEST 1 VIEW

[chest]
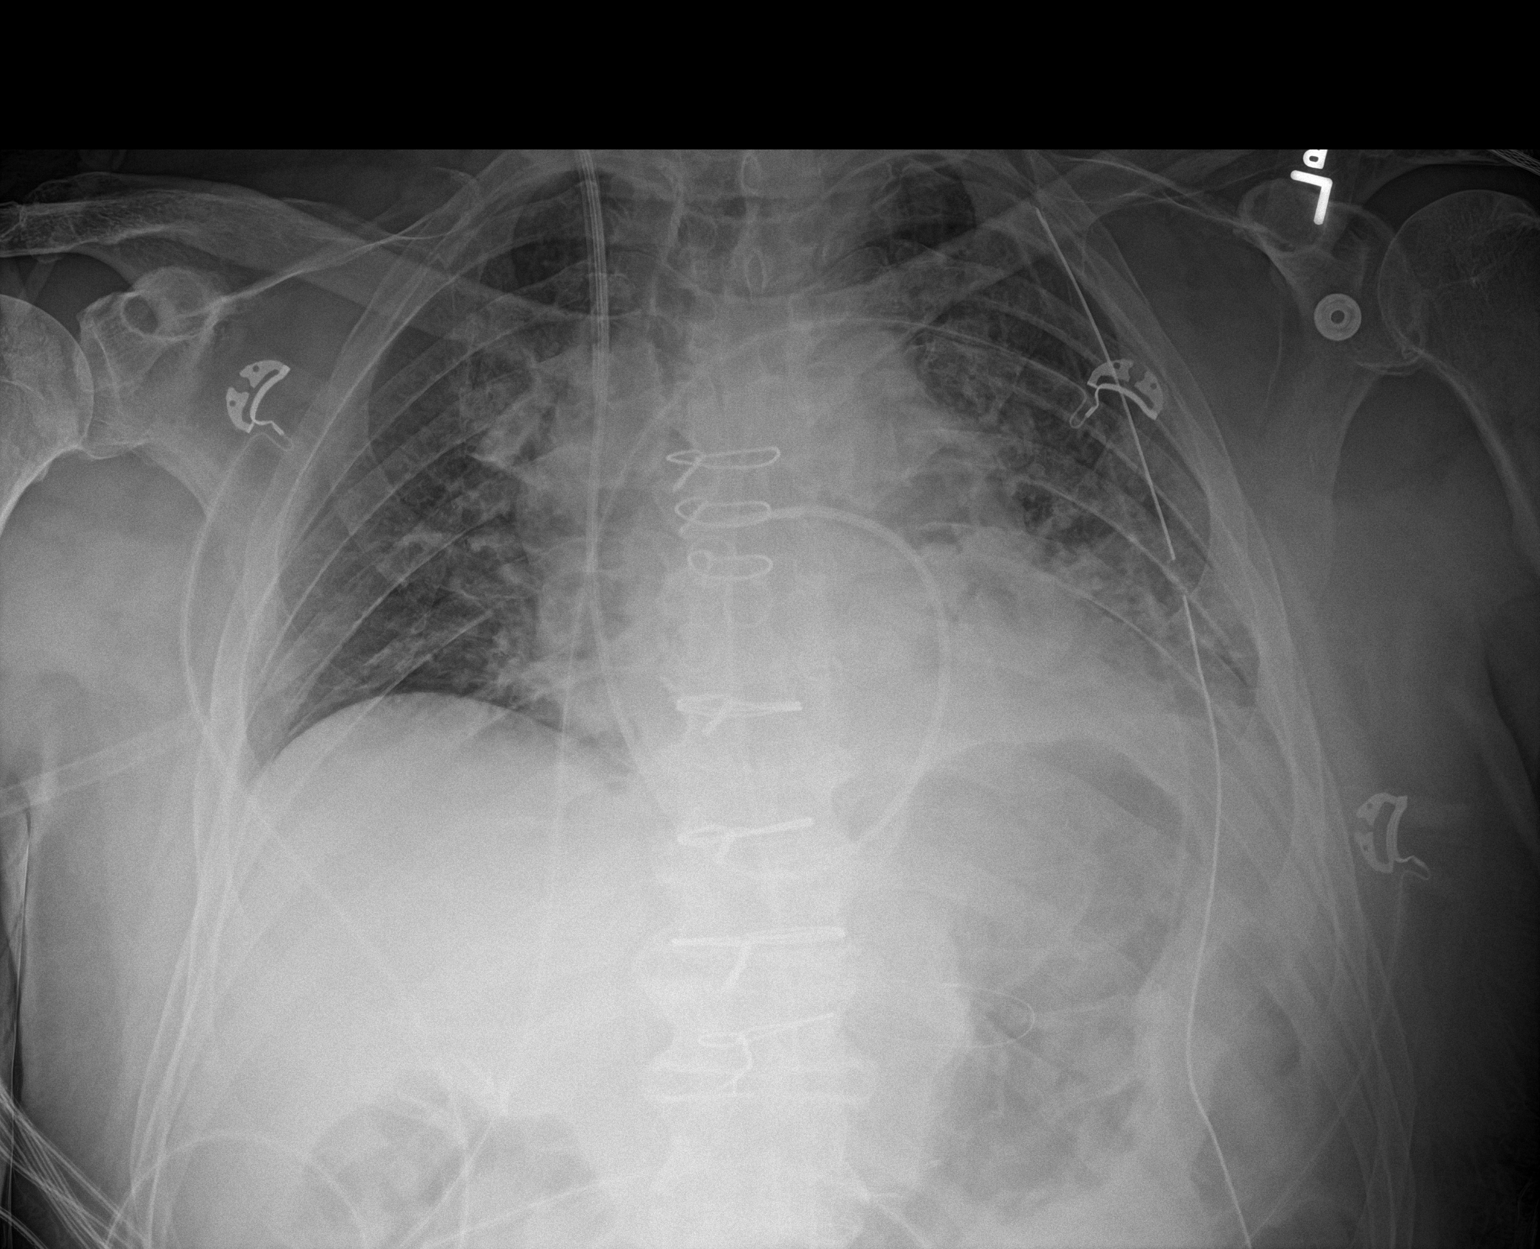

[1 of 1 positions shown; findings below may reference images not displayed]

FINDINGS: Changes of prior coronary bypass grafting are again seen. Swan-Ganz
catheter, mediastinal drain and left thoracostomy catheter are seen.
The endotracheal tube and nasogastric catheter have been removed.
The overall inspiratory effort is poor with crowding of the vascular
markings. Some increased left basilar atelectasis is noted. No
pneumothorax is noted.
IMPRESSION: Poor inspiratory effort with increasing left basilar atelectasis.

## 2018-07-21 ENCOUNTER — Ambulatory Visit (INDEPENDENT_AMBULATORY_CARE_PROVIDER_SITE_OTHER): Payer: Medicare Other | Admitting: *Deleted

## 2018-07-21 DIAGNOSIS — I480 Paroxysmal atrial fibrillation: Secondary | ICD-10-CM

## 2018-07-21 DIAGNOSIS — I2510 Atherosclerotic heart disease of native coronary artery without angina pectoris: Secondary | ICD-10-CM | POA: Diagnosis not present

## 2018-07-21 DIAGNOSIS — I10 Essential (primary) hypertension: Secondary | ICD-10-CM

## 2018-07-21 NOTE — Patient Instructions (Signed)
Thank you allowing the Chronic Care Management Team to be a part of your care! It was a pleasure speaking with you today!  CCM (Chronic Care Management) Team   Hermes Wafer RN, BSN Nurse Care Coordinator  [phone]  [facility] PharmD  Clinical Pharmacist  [phone]  Dickie LaBrooke Joyce LCSW Clinical Social Worker [phone]  Goals Addressed            This Visit's Progress   . I would like to lose 20lbs (pt-stated)       Current Barriers:  [name]. Knowledge Deficits related to weight loss   Nurse Case Manager Clinical Goal(s):  [name]. [facility] the next 90 days[facility] to [facility] loss goal of 20 lbs  Interventions:  . Discussed plans with patient for ongoing care management follow up and provided patient with direct contact information for care management team . Encouraged patient to take advantage of in season fruits and vegetables . Discussed the dangers of fried foods . Encouraged patient to continue his exercise habits of biking, walking and golfing.    Patient Self Care Activities:  . Performs ADL's independently . Performs IADL's independently  Initial goal documentation     . My goal is to have less pain in my hip, knee and chest muscle (pt-stated)       Current Barriers:  [name]. Knowledge Deficits related to dealing with chronic pain  Nurse Case Manager Clinical Goal(s):  [name]. Over the next 90 days, [facility] and [facility]opedic MD  to address needs related to pain to right hip and knee and Left chest  Interventions:  . Advised patient to to call Cardiologist to report episode of Afib and left chest muscle pain.  . Discussed plans with patient for ongoing care management follow up and provided patient with direct contact information for care management team . Encouraged patient to continue with moderate exercise routine and monitoring of b/p, weight and HR. [name]. Discussed timing and onset of pain,  discussed writing these factors down to discuss with MD at his f/u appointment.  Patient Self Care Activities:  . Currently UNABLE TO independently perform all social activities related to pain   . Attends church or other social activities . Performs ADL's independently . Performs IADL's independently  Initial goal documentation        The patient verbalized understanding of instructions provided today and declined a print copy of patient instruction materials.   The care management team will reach out to the patient again over the next 30 days.  The patient has been provided with contact information for the care management team and has been advised to call with any health related questions or concerns.     Calorie Counting for Weight Loss Calories are units of energy. Your body needs a certain amount of calories from food to keep you going throughout the day. When you eat more calories than your body needs, your body stores the extra calories as fat. When you eat fewer calories than your body needs, your body burns fat to get the energy it needs. Calorie counting means keeping track of how many calories you eat and drink each day. Calorie counting can be helpful if you need to lose weight. If you make sure to eat fewer calories than your body needs, you should lose weight. Ask your health care provider what a healthy weight is for you. For calorie counting to work, you will  need to eat the right number of calories in a day in order to lose a healthy amount of weight per week. A dietitian can help you determine how many calories you need in a day and will give you suggestions on how to reach your calorie goal.  A healthy amount of weight to lose per week is usually 1-2 lb (0.5-0.9 kg). This usually means that your daily calorie intake should be reduced by 500-750 calories.  Eating 1,200 - 1,500 calories per day can help most women lose weight.  Eating 1,500 - 1,800 calories per day can  help most men lose weight. What is my plan? My goal is to have __________ calories per day. If I have this many calories per day, I should lose around __________ pounds per week. What do I need to know about calorie counting? In order to meet your daily calorie goal, you will need to:  Find out how many calories are in each food you would like to eat. Try to do this before you eat.  Decide how much of the food you plan to eat.  Write down what you ate and how many calories it had. Doing this is called keeping a food log. To successfully lose weight, it is important to balance calorie counting with a healthy lifestyle that includes regular activity. Aim for 150 minutes of moderate exercise (such as walking) or 75 minutes of vigorous exercise (such as running) each week. Where do I find calorie information?  The number of calories in a food can be found on a Nutrition Facts label. If a food does not have a Nutrition Facts label, try to look up the calories online or ask your dietitian for help. Remember that calories are listed per serving. If you choose to have more than one serving of a food, you will have to multiply the calories per serving by the amount of servings you plan to eat. For example, the label on a package of bread might say that a serving size is 1 slice and that there are 90 calories in a serving. If you eat 1 slice, you will have eaten 90 calories. If you eat 2 slices, you will have eaten 180 calories. How do I keep a food log? Immediately after each meal, record the following information in your food log:  What you ate. Don't forget to include toppings, sauces, and other extras on the food.  How much you ate. This can be measured in cups, ounces, or number of items.  How many calories each food and drink had.  The total number of calories in the meal. Keep your food log near you, such as in a small notebook in your pocket, or use a mobile app or website. Some programs  will calculate calories for you and show you how many calories you have left for the day to meet your goal. What are some calorie counting tips?   Use your calories on foods and drinks that will fill you up and not leave you hungry: ? Some examples of foods that fill you up are nuts and nut butters, vegetables, lean proteins, and high-fiber foods like whole grains. High-fiber foods are foods with more than 5 g fiber per serving. ? Drinks such as sodas, specialty coffee drinks, alcohol, and juices have a lot of calories, yet do not fill you up.  Eat nutritious foods and avoid empty calories. Empty calories are calories you get from foods or beverages that do not have  many vitamins or protein, such as candy, sweets, and soda. It is better to have a nutritious high-calorie food (such as an avocado) than a food with few nutrients (such as a bag of chips).  Know how many calories are in the foods you eat most often. This will help you calculate calorie counts faster.  Pay attention to calories in drinks. Low-calorie drinks include water and unsweetened drinks.  Pay attention to nutrition labels for "low fat" or "fat free" foods. These foods sometimes have the same amount of calories or more calories than the full fat versions. They also often have added sugar, starch, or salt, to make up for flavor that was removed with the fat.  Find a way of tracking calories that works for you. Get creative. Try different apps or programs if writing down calories does not work for you. What are some portion control tips?  Know how many calories are in a serving. This will help you know how many servings of a certain food you can have.  Use a measuring cup to measure serving sizes. You could also try weighing out portions on a kitchen scale. With time, you will be able to estimate serving sizes for some foods.  Take some time to put servings of different foods on your favorite plates, bowls, and cups so you know  what a serving looks like.  Try not to eat straight from a bag or box. Doing this can lead to overeating. Put the amount you would like to eat in a cup or on a plate to make sure you are eating the right portion.  Use smaller plates, glasses, and bowls to prevent overeating.  Try not to multitask (for example, watch TV or use your computer) while eating. If it is time to eat, sit down at a table and enjoy your food. This will help you to know when you are full. It will also help you to be aware of what you are eating and how much you are eating. What are tips for following this plan? Reading food labels  Check the calorie count compared to the serving size. The serving size may be smaller than what you are used to eating.  Check the source of the calories. Make sure the food you are eating is high in vitamins and protein and low in saturated and trans fats. Shopping  Read nutrition labels while you shop. This will help you make healthy decisions before you decide to purchase your food.  Make a grocery list and stick to it. Cooking  Try to cook your favorite foods in a healthier way. For example, try baking instead of frying.  Use low-fat dairy products. Meal planning  Use more fruits and vegetables. Half of your plate should be fruits and vegetables.  Include lean proteins like poultry and fish. How do I count calories when eating out?  Ask for smaller portion sizes.  Consider sharing an entree and sides instead of getting your own entree.  If you get your own entree, eat only half. Ask for a box at the beginning of your meal and put the rest of your entree in it so you are not tempted to eat it.  If calories are listed on the menu, choose the lower calorie options.  Choose dishes that include vegetables, fruits, whole grains, low-fat dairy products, and lean protein.  Choose items that are boiled, broiled, grilled, or steamed. Stay away from items that are buttered, battered,  fried, or served  with cream sauce. Items labeled "crispy" are usually fried, unless stated otherwise.  Choose water, low-fat milk, unsweetened iced tea, or other drinks without added sugar. If you want an alcoholic beverage, choose a lower calorie option such as a glass of wine or light beer.  Ask for dressings, sauces, and syrups on the side. These are usually high in calories, so you should limit the amount you eat.  If you want a salad, choose a garden salad and ask for grilled meats. Avoid extra toppings like bacon, cheese, or fried items. Ask for the dressing on the side, or ask for olive oil and vinegar or lemon to use as dressing.  Estimate how many servings of a food you are given. For example, a serving of cooked rice is  cup or about the size of half a baseball. Knowing serving sizes will help you be aware of how much food you are eating at restaurants. The list below tells you how big or small some common portion sizes are based on everyday objects: ? 1 oz-4 stacked dice. ? 3 oz-1 deck of cards. ? 1 tsp-1 die. ? 1 Tbsp- a ping-pong ball. ? 2 Tbsp-1 ping-pong ball. ?  cup- baseball. ? 1 cup-1 baseball. Summary  Calorie counting means keeping track of how many calories you eat and drink each day. If you eat fewer calories than your body needs, you should lose weight.  A healthy amount of weight to lose per week is usually 1-2 lb (0.5-0.9 kg). This usually means reducing your daily calorie intake by 500-750 calories.  The number of calories in a food can be found on a Nutrition Facts label. If a food does not have a Nutrition Facts label, try to look up the calories online or ask your dietitian for help.  Use your calories on foods and drinks that will fill you up, and not on foods and drinks that will leave you hungry.  Use smaller plates, glasses, and bowls to prevent overeating. This information is not intended to replace advice given to you by your health care provider.  Make sure you discuss any questions you have with your health care provider. Document Released: 01/13/2005 Document Revised: 10/02/2017 Document Reviewed: 12/14/2015 Elsevier Interactive Patient Education  2019 [employer]

## 2018-07-21 NOTE — Chronic Care Management (AMB) (Signed)
  Chronic Care Management   Follow Up Note   07/21/2018 Name: Thomas Miller MRN: [id number] DOB: 05-31-1950  Referred by: Guadalupe Maple, MD Reason for referral : Chronic Care Management (HTN, Afib, CAD, )   Thomas Miller is a 68 y.o. year old male who is a primary care patient of Crissman, Jeannette How, MD. The CCM team was consulted for assistance with chronic disease management and care coordination needs.    Review of patient status, including review of consultants reports, relevant laboratory and other test results, and collaboration with appropriate care team members and the patient's provider was performed as part of comprehensive patient evaluation and provision of chronic care management services.   Initial call to this patient to introduce CCM and set goals. Patient did note spouse in the early stages of Alzheimer's. Made him aware as this progresses support available. Patient reports good support system at this time.   Goals Addressed            This Visit's Progress   . I would like to lose 20lbs (pt-stated)       Current Barriers:  [name] Knowledge Deficits related to weight loss   Nurse Case Manager Clinical Goal(s):  [name] Over the next 90 days, patient will work with [facility] to address needs related to weight loss goal of 20 lbs  Interventions:  . Discussed plans with patient for ongoing care management follow up and provided patient with direct contact information for care management team . Encouraged patient to take advantage of in season fruits and vegetables . Discussed the dangers of fried foods . Encouraged patient to continue his exercise habits of biking, walking and golfing.    Patient Self Care Activities:  . Performs ADL's independently . Performs IADL's independently  Initial goal documentation     . My goal is to have less pain in my hip, knee and chest muscle (pt-stated)       Current Barriers:  [name] Knowledge Deficits related to dealing with chronic pain  Nurse  Case Manager Clinical Goal(s):  [name] Over the next 90 days, patient will work with [facility], Cardiologist and Orthopedic MD  to address needs related to pain to right hip and knee and Left chest  Interventions:  . Advised patient to to call Cardiologist to report episode of Afib and left chest muscle pain.  . Discussed plans with patient for ongoing care management follow up and provided patient with direct contact information for care management team . Encouraged patient to continue with moderate exercise routine and monitoring of b/p, weight and HR. [name] Discussed timing and onset of pain, discussed writing these factors down to discuss with MD at his f/u appointment.  Patient Self Care Activities:  . Currently UNABLE TO independently perform all social activities related to pain   . Attends church or other social activities . Performs ADL's independently . Performs IADL's independently  Initial goal documentation         The care management team will reach out to the patient again over the next 30 days.  The patient has been provided with contact information for the care management team and has been advised to call with any health related questions or concerns.   Merlene Morse Andersson Larrabee RN, BSN Nurse Case Editor, [facility]ing [facility] Care [facility]  [phone]) Business Mobile

## 2018-08-03 ENCOUNTER — Ambulatory Visit (INDEPENDENT_AMBULATORY_CARE_PROVIDER_SITE_OTHER): Payer: Medicare Other | Admitting: Pharmacist

## 2018-08-03 DIAGNOSIS — I2510 Atherosclerotic heart disease of native coronary artery without angina pectoris: Secondary | ICD-10-CM | POA: Diagnosis not present

## 2018-08-03 DIAGNOSIS — I480 Paroxysmal atrial fibrillation: Secondary | ICD-10-CM | POA: Diagnosis not present

## 2018-08-03 NOTE — Chronic Care Management (AMB) (Signed)
Chronic Care Management   Note  08/03/2018 Name: Thomas Miller MRN: [id number] DOB: 06/06/1950   Subjective:  Thomas Miller is a 68 y.o. year old male who is a primary care patient of Thomas, Jeannette How, MD. The CCM team was consulted for assistance with chronic disease management and care coordination needs.    Received message from [place], RN asking for me to perform a medication review with this patient.    Review of patient status, including review of consultants reports, laboratory and other test data, was performed as part of comprehensive evaluation and provision of chronic care management services.   Objective:  Lab Results  Component Value Date   CREATININE 1.17 01/02/2018   CREATININE 1.11 12/16/2017   CREATININE 1.02 09/14/2017    Lab Results  Component Value Date   HGBA1C 5.9 (H) 07/20/2017       Component Value Date/Time   CHOL 145 12/16/2017 1019   CHOL 142 06/03/2017 0811   TRIG 123 12/16/2017 1019   TRIG 123 06/03/2017 0811   HDL 46 12/16/2017 1019   CHOLHDL 3.2 12/16/2017 1019   CHOLHDL 3.0 07/18/2017 0249   VLDL 10 07/18/2017 0249   VLDL 25 06/03/2017 0811   LDLCALC 74 12/16/2017 1019    Clinical ASCVD: Yes  - hx revascularization (CABG 06/2017)  BP Readings from Last 3 Encounters:  07/05/18 (!) 158/100  06/30/18 119/68  03/10/18 140/80    Allergies  Allergen Reactions  . Ace Inhibitors     "cough"  . Codeine Other (See Comments)    Insomnia  . Tape   . Naprosyn [Naproxen] Rash    Medications Reviewed Today    Reviewed by De Hollingshead, [facility] (Pharmacist) on 08/03/18 at 0850  Med List Status: <None>  Medication Order Taking? Sig Documenting Provider Last Dose Status Informant  allopurinol (ZYLOPRIM) 300 MG tablet [id number] Yes Take 1 tablet (300 mg total) by mouth daily. Thomas Maple, MD Taking Active Self  aspirin EC 81 MG tablet [id number] Yes Take 2 tablets (162 mg total) by mouth daily. Revankar, Reita Cliche, MD Taking Active  Self  atorvastatin (LIPITOR) 80 MG tablet [id number] Yes Take 1 tablet (80 mg total) by mouth daily at 6 PM. Thomas Maple, MD Taking Active Self  esomeprazole (NEXIUM) 40 MG capsule [id number] Yes Take 1 capsule (40 mg total) by mouth daily before breakfast. Thomas Maple, MD Taking Active Self  losartan (COZAAR) 25 MG tablet [id number] Yes Take 1 tablet (25 mg total) by mouth daily. Thomas Maple, MD Taking Active Self  metoprolol tartrate (LOPRESSOR) 25 MG tablet [id number] Yes Take 1 tablet (25 mg total) by mouth 2 (two) times daily. Thomas Maple, MD Taking Active Self  Multiple Vitamin (MULTIVITAMIN) tablet [id number] Yes Take 1 tablet by mouth daily. [provider] Taking Active Self  nitroGLYCERIN (NITROSTAT) 0.4 MG SL tablet [id number] No Place 1 tablet (0.4 mg total) under the tongue every 5 (five) minutes as needed for chest pain.  Patient not taking: Reported on 08/03/2018   RevankarReita Cliche, MD Not Taking Expired 03/10/18 2359 Self           Assessment:   Goals Addressed            This Visit's Progress     Patient Stated   . "I want to protect my heart" (pt-stated)       Current Barriers:  . 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  Thomas Miller  HLD: atorvastatin 80 mg, LDL 74 on last check . Afib/HTN: losartan 25 mg QAM, metoprolol tartrate 25 mg BID, ASA 162 mg daily o CHADS2VASc score of 3 (age, HTN, vascular disease) o Patient notes that he has had some "left sided chest tightening" occasionally in the past few weeks, resolves with rest. Denies radiation, denies associated lightheadedness/dizziness. Has not felt he needed NTG for it o Reports home BP this morning 115/72, HR 55. Notes all SBP <130 . Gout; uric acid well controlled on last check < 6 mg/dL on allopurinol 161300 mg daily   Pharmacist Clinical Goal(s):  Thomas Miller. Over the next 90 days, patient will work with PharmD, primary care provider, and cardiologist to address needs related to optimized cardiovascular  risk reduction  Interventions: . Comprehensive medication review performed. . Encouraged patient to schedule f/u with cardiology regarding his new left sided chest "tightening". He noted he would call soon. He understands appropriate NTG dosing . For secondary prevention of ASCVD in high risk patient, recommend targeting goal LDL <70. Patient is near goal on atorvastatin 80; consider d/c atorvastatin and start rosuvastatin 40 mg daily d/t greater efficacy on LDL lowering for optimized risk reduction . D/t CHADS2VASc >2, patient qualifies for chronic anticoagulation for stroke prevention. He is on ASA 162 mg daily, but DOAC therapy is preferred for stroke prevention in Afib. Consider discussing initiation of DOAC w/ cardiology.   Patient Self Care Activities:  . Self administers medications as prescribed . Calls pharmacy for medication refills . Calls provider office for new concerns or questions  Initial goal documentation        Plan: - Recommendations above being routed to primary care provider Dr. Dossie Arbourrissman.  - Will outreach patient in 4-6 weeks for continued medication management support, and to ensure he was able to connect with cardiology   [name], PharmD Clinical Pharmacist [facility] [phone]

## 2018-08-03 NOTE — Patient Instructions (Signed)
Visit Information  Goals Addressed            This Visit's Progress     Patient Stated   . "I want to protect my heart" (pt-stated)       Current Barriers:  . 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  [name] HLD: atorvastatin 80 mg, LDL 74 on last check . Afib/HTN: losartan 25 mg QAM, metoprolol tartrate 25 mg BID, ASA 162 mg daily o CHADS2VASc score of 3 (age, HTN, vascular disease) o Patient notes that he has had some "left sided chest tightening" occasionally in the past few weeks, resolves with rest. Denies radiation, denies associated lightheadedness/dizziness. Has not felt he needed NTG for it o Reports home BP this morning 115/72, HR 55. Notes all SBP <130 . Gout; uric acid well controlled on last check < 6 mg/dL on allopurinol 300 mg daily   Pharmacist Clinical Goal(s):  [name] Over the next 90 days, patient will work with PharmD, primary care provider, and cardiologist to address needs related to optimized cardiovascular risk reduction  Interventions: . Comprehensive medication review performed. . Encouraged patient to schedule f/u with cardiology regarding his new left sided chest "tightening". He noted he would call soon. He understands appropriate NTG dosing . For secondary prevention of ASCVD in high risk patient, recommend targeting goal LDL <70. Patient is near goal on atorvastatin 80; consider d/c atorvastatin and start rosuvastatin 40 mg daily d/t greater efficacy on LDL lowering for optimized risk reduction . D/t CHADS2VASc >2, patient qualifies for chronic anticoagulation for stroke prevention. He is on ASA 162 mg daily, but DOAC therapy is preferred for stroke prevention in Afib. Consider discussing initiation of DOAC w/ cardiology.   Patient Self Care Activities:  . Self administers medications as prescribed . Calls pharmacy for medication refills . Calls provider office for new concerns or questions  Initial goal documentation        The patient verbalized  understanding of instructions provided today and declined a print copy of patient instruction materials.   Plan: - Recommendations above being routed to primary care provider Dr. Jeananne Rama.  - Will outreach patient in 4-6 weeks for continued medication management support, and to ensure he was able to connect with cardiology   Catie Darnelle Maffucci, PharmD Clinical Pharmacist [place] [phone]

## 2018-08-18 ENCOUNTER — Telehealth: Payer: Medicare Other

## 2018-09-01 ENCOUNTER — Telehealth: Payer: Self-pay

## 2018-09-21 ENCOUNTER — Ambulatory Visit (INDEPENDENT_AMBULATORY_CARE_PROVIDER_SITE_OTHER): Payer: Medicare Other | Admitting: *Deleted

## 2018-09-21 DIAGNOSIS — I10 Essential (primary) hypertension: Secondary | ICD-10-CM

## 2018-09-21 DIAGNOSIS — I2510 Atherosclerotic heart disease of native coronary artery without angina pectoris: Secondary | ICD-10-CM | POA: Diagnosis not present

## 2018-09-21 NOTE — Chronic Care Management (AMB) (Signed)
Chronic Care Management   Follow Up Note   09/21/2018 Name: Thomas Miller MRN: [id number] DOB: 12-03-1950  Referred by: Thomas Maple, Miller Reason for referral : Chronic Care Management (CAD )   Thomas Miller is a 68 y.o. year old male who is a primary care patient of Thomas Miller, Thomas How, Miller. The CCM team was consulted for assistance with chronic disease management and care coordination needs.    Review of patient status, including review of consultants reports, relevant laboratory and other test results, and collaboration with appropriate care team members and the patient's provider was performed as part of comprehensive patient evaluation and provision of chronic care management services.    SDOH (Social Determinants of Health) screening performed today: Stress. See Care Plan for related entries.   Outpatient Encounter Medications as of 09/21/2018  Medication Sig  . allopurinol (ZYLOPRIM) 300 MG tablet Take 1 tablet (300 mg total) by mouth daily.  [name] aspirin EC 81 MG tablet Take 2 tablets (162 mg total) by mouth daily.  [name] atorvastatin (LIPITOR) 80 MG tablet Take 1 tablet (80 mg total) by mouth daily at 6 PM.  . esomeprazole (NEXIUM) 40 MG capsule Take 1 capsule (40 mg total) by mouth daily before breakfast.  . losartan (COZAAR) 25 MG tablet Take 1 tablet (25 mg total) by mouth daily.  . metoprolol tartrate (LOPRESSOR) 25 MG tablet Take 1 tablet (25 mg total) by mouth 2 (two) times daily.  . Multiple Vitamin (MULTIVITAMIN) tablet Take 1 tablet by mouth daily.  . nitroGLYCERIN (NITROSTAT) 0.4 MG SL tablet Place 1 tablet (0.4 mg total) under the tongue every 5 (five) minutes as needed for chest pain. (Patient not taking: Reported on 08/03/2018)   No facility-administered encounter medications on file as of 09/21/2018.      Goals Addressed            This Visit's Progress   . I would like to lose 20lbs (pt-stated)       Current Barriers:  [name] Knowledge Deficits related to weight loss    Nurse Case Manager Clinical Goal(s):  [name] Over the next 90 days, patient will work with Thomas Miller to address needs related to weight loss goal of 20 lbs  Interventions:  . Discussed plans with patient for ongoing care management follow up and provided patient with direct contact information for care management team . Encouraged patient to take advantage of in season fruits and vegetables . Discussed the dangers of fried foods . Encouraged patient to continue his exercise habits of biking, walking and golfing.   . Reviewed weight loss strategies . Patient stated he continues to want to reduce weight. Verbalized most recent weight 202lbs.   Patient Self Care Activities:  . Performs ADL's independently . Performs IADL's independently  Initial goal documentation     . My goal is to have less pain in my hip, knee and chest muscle (pt-stated)       Current Barriers:  [name] Knowledge Deficits related to dealing with chronic pain  Nurse Case Manager Clinical Goal(s):  [name] Over the next 90 days, patient will work with Thomas Miller, Thomas Miller, Thomas Miller and Thomas Miller  to address needs related to pain to right hip and knee and Left chest  Interventions:  . Advised patient to to call Thomas Miller to report episode of Afib and left chest muscle pain.  . Discussed plans with patient for ongoing care management follow up and provided patient with direct contact information for care management team .  Reviewed scheduled/upcoming provider appointments including:  Thomas Miller 8/31.  . Encouraged patient to continue with moderate exercise routine and monitoring of b/p, weight and HR. [name]. Discussed timing and onset of pain, discussed writing these factors down to discuss with Miller at his f/u appointment. Patient plans to take recorded heart rates, blood pressures and weights for Miller to evaluate.  . Patient was working during our call and stated his pain was really the same, no worse no better.    Patient Self Care Activities:   . Currently UNABLE TO independently perform all social activities related to pain   . Attends church or other social activities . Performs ADL's independently . Performs IADL's independently  Please see past updates related to this goal by clicking on the "Past Updates" button in the selected goal          The care management team will reach out to the patient again over the next 30 days.  The patient has been provided with contact information for the care management team and has been advised to call with any health related questions or concerns.    Ma RingsJanci Caitrin Pendergraph RN, BSN Nurse Case Education officer, communityManager [facility]  [phone]) Business Mobile

## 2018-09-21 NOTE — Patient Instructions (Signed)
Thank you allowing the Chronic Care Management Team to be a part of your care! It was a pleasure speaking with you today!   CCM (Chronic Care Management) Team   Eryanna Regal RN, BSN Nurse Care Coordinator  [phone]  Catie [facility] PharmD  Clinical Pharmacist  [phone]  Eula Fried LCSW Clinical Social Worker [phone]  Goals Addressed            This Visit's Progress   . I would like to lose 20lbs (pt-stated)       Current Barriers:  [name] Knowledge Deficits related to weight loss   Nurse Case Manager Clinical Goal(s):  [name] Over the next 90 days, patient will work with [facility] to address needs related to weight loss goal of 20 lbs  Interventions:  . Discussed plans with patient for ongoing care management follow up and provided patient with direct contact information for care management team . Encouraged patient to take advantage of in season fruits and vegetables . Discussed the dangers of fried foods . Encouraged patient to continue his exercise habits of biking, walking and golfing.   . Reviewed weight loss strategies . Patient stated he continues to want to reduce weight. Verbalized most recent weight 202lbs.   Patient Self Care Activities:  . Performs ADL's independently . Performs IADL's independently  Initial goal documentation     . My goal is to have less pain in my hip, knee and chest muscle (pt-stated)       Current Barriers:  [name] Knowledge Deficits related to dealing with chronic pain  Nurse Case Manager Clinical Goal(s):  [name] Over the next 90 days, patient will work with [facility], Cardiologist and Orthopedic MD  to address needs related to pain to right hip and knee and Left chest  Interventions:  . Advised patient to to call Cardiologist to report episode of Afib and left chest muscle pain.  . Discussed plans with patient for ongoing care management follow up and provided patient with direct contact information for care management team . Reviewed  scheduled/upcoming provider appointments including:  Cardiology appt 8/31.  . Encouraged patient to continue with moderate exercise routine and monitoring of b/p, weight and HR. [name] Discussed timing and onset of pain, discussed writing these factors down to discuss with MD at his f/u appointment. Patient plans to take recorded heart rates, blood pressures and weights for MD to evaluate.  . Patient was working during our call and stated his pain was really the same, no worse no better.    Patient Self Care Activities:  . Currently UNABLE TO independently perform all social activities related to pain   . Attends church or other social activities . Performs ADL's independently . Performs IADL's independently  Please see past updates related to this goal by clicking on the "Past Updates" button in the selected goal         The patient verbalized understanding of instructions provided today and declined a print copy of patient instruction materials.   The patient has been provided with contact information for the care management team and has been advised to call with any health related questions or concerns.

## 2018-09-27 ENCOUNTER — Encounter: Payer: Self-pay | Admitting: Cardiology

## 2018-09-27 ENCOUNTER — Other Ambulatory Visit: Payer: Self-pay

## 2018-09-27 ENCOUNTER — Ambulatory Visit (INDEPENDENT_AMBULATORY_CARE_PROVIDER_SITE_OTHER): Payer: Medicare Other | Admitting: Cardiology

## 2018-09-27 VITALS — BP 138/90 | HR 60 | Ht 70.0 in | Wt 206.0 lb

## 2018-09-27 DIAGNOSIS — I10 Essential (primary) hypertension: Secondary | ICD-10-CM

## 2018-09-27 DIAGNOSIS — E782 Mixed hyperlipidemia: Secondary | ICD-10-CM

## 2018-09-27 DIAGNOSIS — I2510 Atherosclerotic heart disease of native coronary artery without angina pectoris: Secondary | ICD-10-CM | POA: Diagnosis not present

## 2018-09-27 DIAGNOSIS — I480 Paroxysmal atrial fibrillation: Secondary | ICD-10-CM

## 2018-09-27 DIAGNOSIS — Z951 Presence of aortocoronary bypass graft: Secondary | ICD-10-CM

## 2018-09-27 LAB — LIPID PANEL
Chol/HDL Ratio: 3.8 ratio (ref 0.0–5.0)
Cholesterol, Total: 144 mg/dL (ref 100–199)
HDL: 38 mg/dL — ABNORMAL LOW (ref >39)
LDL Chol Calc (NIH): 73 mg/dL (ref 0–99)
Triglycerides: 197 mg/dL — ABNORMAL HIGH (ref 0–149)
VLDL Cholesterol Cal: 33 mg/dL (ref 5–40)

## 2018-09-27 NOTE — Addendum Note (Signed)
Addended by: Beckey Rutter on: 09/27/2018 08:57 AM   Modules accepted: Orders

## 2018-09-27 NOTE — Progress Notes (Signed)
Cardiology Office Note:    Date:  09/27/2018   ID:  [name], DOB 04-14-1950, MRN [id number]  PCP:  Guadalupe Maple, MD  Cardiologist:  Jenean Lindau, MD   Referring MD: Guadalupe Maple, MD    ASSESSMENT:    1. PAF (paroxysmal atrial fibrillation) ([place])   2. Essential hypertension   3. Coronary artery disease involving native coronary artery of native heart without angina pectoris   4. S/P CABG x 4   5. Mixed hyperlipidemia    PLAN:    In order of problems listed above:  1. Coronary artery disease: Secondary prevention stressed with the patient.  Importance of compliance with diet and medication stressed and he vocalized understanding.  I told him to walk at least 30 minutes a day 5 days a week and he is agreeable. 2. Essential hypertension: Blood pressure stable 3. Mixed dyslipidemia: Patient will have lipids checked today.  I also told him to cut down his aspirin to 81 mg of coated aspirin daily. 4. Patient will be seen in follow-up appointment in 6 months or earlier if the patient has any concerns    Medication Adjustments/Labs and Tests Ordered: Current medicines are reviewed at length with the patient today.  Concerns regarding medicines are outlined above.  No orders of the defined types were placed in this encounter.  No orders of the defined types were placed in this encounter.    Chief Complaint  Patient presents with  . Follow-up     History of Present Illness:    Thomas Miller is a 68 y.o. male.  Patient has past medical history of coronary artery disease post CABG surgery, essential hypertension and dyslipidemia.  He denies any problems at this time and takes care of activities of daily living.  No chest pain orthopnea or PND.  He has some pain occasionally at times at the surgical site.  He walks a mile at least daily basis without any problems.  Then he golfs regularly.  With this he has no symptoms.  At the time of my evaluation, the patient is  alert awake oriented and in no distress.  Past Medical History:  Diagnosis Date  . Gout   . Hearing loss   . Hypertension   . Kidney stones   . Reflux   . S/P epidural steroid injection 05/2018    Past Surgical History:  Procedure Laterality Date  . CORONARY ARTERY BYPASS GRAFT N/A 07/20/2017   Procedure: CORONARY ARTERY BYPASS GRAFTING (CABG) times four using left internal mammary artery to LAD and endoscopically harvested right saphenous vein graft to distal circumflex, intermediate, and PD;  Surgeon: Grace Isaac, MD;  Location: [place];  Service: Open Heart Surgery;  Laterality: N/A;  . EYE SURGERY Bilateral 2013  . FOOT SURGERY Bilateral    ingrown toenails  . GALLBLADDER SURGERY    . LEFT HEART CATH AND CORONARY ANGIOGRAPHY N/A 07/17/2017   Procedure: LEFT HEART CATH AND CORONARY ANGIOGRAPHY;  Surgeon: Nelva Bush, MD;  Location: [place] [facility];  Service: Cardiovascular;  Laterality: N/A;  . SHOULDER SURGERY Right   . SPINE SURGERY      Current Medications: Current Meds  Medication Sig  . allopurinol (ZYLOPRIM) 300 MG tablet Take 1 tablet (300 mg total) by mouth daily.  [name] Blea Cider Vinegar 600 MG CAPS Take 600 mg by mouth daily.  [name] aspirin EC 81 MG tablet Take 2 tablets (162 mg total) by mouth daily.  [name]  atorvastatin (LIPITOR) 80 MG tablet Take 1 tablet (80 mg total) by mouth daily at 6 PM.  . esomeprazole (NEXIUM) 40 MG capsule Take 1 capsule (40 mg total) by mouth daily before breakfast.  . Liniments (DEEP BLUE RELIEF) GEL Apply topically daily.  [name] losartan (COZAAR) 25 MG tablet Take 1 tablet (25 mg total) by mouth daily.  . metoprolol tartrate (LOPRESSOR) 25 MG tablet Take 1 tablet (25 mg total) by mouth 2 (two) times daily.  . nitroGLYCERIN (NITROSTAT) 0.4 MG SL tablet Place 1 tablet (0.4 mg total) under the tongue every 5 (five) minutes as needed for chest pain.  [name] VITAMIN D, CHOLECALCIFEROL, PO Take 2,000 mg by mouth daily.     Allergies:   Ace  inhibitors, Codeine, Tape, and Naprosyn [naproxen]   Social History   Socioeconomic History  . Marital status: Married    Spouse name: Not on file  . Number of children: Not on file  . Years of education: Not on file  . Highest education level: Associate degree: occupational, Scientist, product/process development, or vocational program  Occupational History  . Occupation: retired  Engineer, production  . Financial resource strain: Not hard at all  . Food insecurity    Worry: Never true    Inability: Never true  . Transportation needs    Medical: No    Non-medical: No  Tobacco Use  . Smoking status: Never Smoker  . Smokeless tobacco: Never Used  Substance and Sexual Activity  . Alcohol use: Yes    Comment: rarely  . Drug use: No  . Sexual activity: Not on file  Lifestyle  . Physical activity    Days per week: 5 days    Minutes per session: 30 min  . Stress: Not at all  Relationships  . Social connections    Talks on phone: More than three times a week    Gets together: More than three times a week    Attends religious service: More than 4 times per year    Active member of club or organization: No    Attends meetings of clubs or organizations: Never    Relationship status: Married  Other Topics Concern  . Not on file  Social History Narrative  . Not on file     Family History: The patient's family history includes Diabetes in his mother; Lung cancer in his father; Stroke in his paternal grandfather.  ROS:   Please see the history of present illness.    All other systems reviewed and are negative.  EKGs/Labs/Other Studies Reviewed:    The following studies were reviewed today: I reviewed my findings with the patient extensively.  EKG reveals sinus rhythm and nonspecific ST-T changes.   Recent Labs: 12/16/2017: TSH 2.430 01/02/2018: ALT 25; BUN 20; Creatinine, Ser 1.17; Hemoglobin 16.3; Platelets 220; Potassium 4.2; Sodium 143  Recent Lipid Panel    Component Value Date/Time   CHOL 145  12/16/2017 1019   CHOL 142 06/03/2017 0811   TRIG 123 12/16/2017 1019   TRIG 123 06/03/2017 0811   HDL 46 12/16/2017 1019   CHOLHDL 3.2 12/16/2017 1019   CHOLHDL 3.0 07/18/2017 0249   VLDL 10 07/18/2017 0249   VLDL 25 06/03/2017 0811   LDLCALC 74 12/16/2017 1019    Physical Exam:    VS:  BP 138/90 (BP Location: Left Arm, Patient Position: Sitting, Cuff Size: Normal)   Pulse 60   Ht 5\' 10"  (1.778 m)   Wt 206 lb (93.4 kg)   SpO2  99%   BMI 29.56 kg/m     Wt Readings from Last 3 Encounters:  09/27/18 206 lb (93.4 kg)  06/30/18 210 lb (95.3 kg)  03/10/18 219 lb (99.3 kg)     GEN: Patient is in no acute distress HEENT: Normal NECK: No JVD; No carotid bruits LYMPHATICS: No lymphadenopathy CARDIAC: Hear sounds regular, 2/6 systolic murmur at the apex. RESPIRATORY:  Clear to auscultation without rales, wheezing or rhonchi  ABDOMEN: Soft, non-tender, non-distended MUSCULOSKELETAL:  No edema; No deformity.  CABG site from the past appears unremarkable SKIN: Warm and dry NEUROLOGIC:  Alert and oriented x 3 PSYCHIATRIC:  Normal affect   Signed, Garwin Brothersajan R , MD  09/27/2018 8:37 AM    [facility]

## 2018-09-27 NOTE — Patient Instructions (Signed)
Medication Instructions:  Your physician has recommended you make the following change in your medication:  DECREASE aspirin 81 mg (1 tablet) once daily   If you need a refill on your cardiac medications before your next appointment, please call your pharmacy.   Lab work: Your physician recommends that you have a lipid panel drawn today.   If you have labs (blood work) drawn today and your tests are completely normal, you will receive your results only by: [name] MyChart Message (if you have MyChart) OR . A paper copy in the mail If you have any lab test that is abnormal or we need to change your treatment, we will call you to review the results.  Testing/Procedures: You had an EKG performed today.  Follow-Up: At [facility], you and your health needs are our priority.  As part of our continuing mission to provide you with exceptional heart care, we have created designated Provider Care Teams.  These Care Teams include your primary Cardiologist (physician) and Advanced Practice Providers (APPs -  Physician Assistants and Nurse Practitioners) who all work together to provide you with the care you need, when you need it. You will need a follow up appointment in 6 months.    Any Other Special Instructions Will Be Listed Below   Aspirin and Your Heart  Aspirin is a medicine that prevents the cells in the blood that are used for clotting, called platelets, from sticking together. Aspirin can be used to help reduce the risk of blood clots, heart attacks, and other heart-related problems. Can I take aspirin? Your health care provider will help you determine whether it is safe and beneficial for you to take aspirin daily. Taking aspirin daily may be helpful if you:  Have had a heart attack or chest pain.  Are at risk for a heart attack.  Have undergone open-heart surgery, such as coronary artery bypass surgery (CABG).  Have had coronary angioplasty or a stent.  Have had certain types of  stroke or transient ischemic attack (TIA).  Have peripheral artery disease (PAD).  Have chronic heart rhythm problems such as atrial fibrillation and cannot take an anticoagulant.  Have valve disease or have had surgery on a valve. What are the risks? Daily use of aspirin can cause side effects. Some of these include:  Bleeding. Bleeding problems can be minor or serious. An example of a minor problem is a cut that does not stop bleeding. An example of a more serious problem is stomach bleeding or, rarely, bleeding into the brain. Your risk of bleeding is increased if you are also taking non-steroidal anti-inflammatory drugs (NSAIDs).  Increased bruising.  Upset stomach.  An allergic reaction. People who have nasal polyps have an increased risk of developing an aspirin allergy. General guidelines  Take aspirin only as told by your health care provider. Make sure that you understand how much you should take and what form you should take. The two forms of aspirin are: ? Non-enteric-coated.This type of aspirin does not have a coating and is absorbed quickly. This type of aspirin also comes in a chewable form. ? Enteric-coated. This type of aspirin has a coating that releases the medicine very slowly. Enteric-coated aspirin might cause less stomach upset than non-enteric-coated aspirin. This type of aspirin should not be chewed or crushed.  Limit alcohol intake to no more than 1 drink a day for nonpregnant women and 2 drinks a day for men. Drinking alcohol increases your risk of bleeding. One drink equals  12 oz of beer, 5 oz of wine, or 1 oz of hard liquor. Contact a health care provider if you:  Have unusual bleeding or bruising.  Have stomach pain or nausea.  Have ringing in your ears.  Have an allergic reaction that causes: ? Hives. ? Itchy skin. ? Swelling of the lips, tongue, or face. Get help right away if you:  Notice that your bowel movements are bloody, dark red, or black  in color.  Vomit or cough up blood.  Have blood in your urine.  Cough, have noisy breathing (wheeze), or feel short of breath.  Have chest pain, especially if the pain spreads to the arms, back, neck, or jaw.  Have a severe headache, or a headache with confusion, or dizziness. These symptoms may represent a serious problem that is an emergency. Do not wait to see if the symptoms will go away. Get medical help right away. Call your local emergency services (911 in the [place] Do not drive yourself to the hospital. Summary  Aspirin can be used to help reduce the risk of blood clots, heart attacks, and other heart-related problems.  Daily use of aspirin can increase your risk of side effects. Your health care provider will help you determine whether it is safe and beneficial for you to take aspirin daily.  Take aspirin only as told by your health care provider. Make sure that you understand how much you can take and what form you can take. This information is not intended to replace advice given to you by your health care provider. Make sure you discuss any questions you have with your health care provider. Document Released: 12/27/2007 Document Revised: 11/13/2016 Document Reviewed: 11/13/2016 Elsevier Patient Education  2020 [employer]

## 2018-09-28 ENCOUNTER — Encounter: Payer: Self-pay | Admitting: *Deleted

## 2018-10-22 ENCOUNTER — Telehealth: Payer: Medicare Other

## 2018-11-01 ENCOUNTER — Ambulatory Visit (INDEPENDENT_AMBULATORY_CARE_PROVIDER_SITE_OTHER): Payer: Medicare Other

## 2018-11-01 VITALS — BP 132/68 | HR 58 | Temp 98.3°F | Resp 15 | Ht 69.0 in | Wt 205.7 lb

## 2018-11-01 DIAGNOSIS — Z0000 Encounter for general adult medical examination without abnormal findings: Secondary | ICD-10-CM

## 2018-11-01 DIAGNOSIS — Z1211 Encounter for screening for malignant neoplasm of colon: Secondary | ICD-10-CM | POA: Diagnosis not present

## 2018-11-01 NOTE — Patient Instructions (Signed)
Mr. Thomas Miller , Thank you for taking time to come for your Medicare Wellness Visit. I appreciate your ongoing commitment to your health goals. Please review the following plan we discussed and let me know if I can assist you in the future.   Screening recommendations/referrals: Colonoscopy: cologuard completed 12/10/2015, ordered - due 11/2018 Recommended yearly ophthalmology/optometry visit for glaucoma screening and checkup Recommended yearly dental visit for hygiene and checkup  Vaccinations: Influenza vaccine: declined Pneumococcal vaccine: declined booster Tdap vaccine: due, check with your insurance for coverage  Shingles vaccine: shingrix eligible     Advanced directives: Please bring a copy of your health care power of attorney and living will to the office at your convenience.  Conditions/risks identified: continue exercising and follow up with ortho as needed for hip discomfort   Next appointment: follow up in one year for your annual wellness visit   Preventive Care 65 Years and Older, Male Preventive care refers to lifestyle choices and visits with your health care provider that can promote health and wellness. What does preventive care include?  A yearly physical exam. This is also called an annual well check.  Dental exams once or twice a year.  Routine eye exams. Ask your health care provider how often you should have your eyes checked.  Personal lifestyle choices, including:  Daily care of your teeth and gums.  Regular physical activity.  Eating a healthy diet.  Avoiding tobacco and drug use.  Limiting alcohol use.  Practicing safe sex.  Taking low doses of aspirin every day.  Taking vitamin and mineral supplements as recommended by your health care provider. What happens during an annual well check? The services and screenings done by your health care provider during your annual well check will depend on your age, overall health, lifestyle risk factors,  and family history of disease. Counseling  Your health care provider may ask you questions about your:  Alcohol use.  Tobacco use.  Drug use.  Emotional well-being.  Home and relationship well-being.  Sexual activity.  Eating habits.  History of falls.  Memory and ability to understand (cognition).  Work and work Astron[employer]vironment. Screening  You may have the following tests or measurements:  Height, weight, and BMI.  Blood pressure.  Lipid and cholesterol levels. These may be checked every 5 years, or more frequently if you are over 51 years old.  Skin check.  Lung cancer screening. You may have this screening every year starting at age 69 if you have a 30-pack-year history of smoking and currently smoke or have quit within the past 15 years.  Fecal occult blood test (FOBT) of the stool. You may have this test every year starting at age 69.  Flexible sigmoidoscopy or colonoscopy. You may have a sigmoidoscopy every 5 years or a colonoscopy every 10 years starting at age 69.  Prostate cancer screening. Recommendations will vary depending on your family history and other risks.  Hepatitis C blood test.  Hepatitis B blood test.  Sexually transmitted disease (STD) testing.  Diabetes screening. This is done by checking your blood sugar (glucose) after you have not eaten for a while (fasting). You may have this done every 1-3 years.  Abdominal aortic aneurysm (AAA) screening. You may need this if you are a current or former smoker.  Osteoporosis. You may be screened starting at age 69 if you are at high risk. Talk with your health care provider about your test results, treatment options, and if necessary, the need for more  tests. Vaccines  Your health care provider may recommend certain vaccines, such as:  Influenza vaccine. This is recommended every year.  Tetanus, diphtheria, and acellular pertussis (Tdap, Td) vaccine. You may need a Td booster every 10 years.   Zoster vaccine. You may need this after age 39.  Pneumococcal 13-valent conjugate (PCV13) vaccine. One dose is recommended after age 48.  Pneumococcal polysaccharide (PPSV23) vaccine. One dose is recommended after age 46. Talk to your health care provider about which screenings and vaccines you need and how often you need them. This information is not intended to replace advice given to you by your health care provider. Make sure you discuss any questions you have with your health care provider. Document Released: 02/09/2015 Document Revised: 10/03/2015 Document Reviewed: 11/14/2014 Elsevier Interactive Patient Education  2017 [place] Prevention in the Home Falls can cause injuries. They can happen to people of all ages. There are many things you can do to make your home safe and to help prevent falls. What can I do on the outside of my home?  Regularly fix the edges of walkways and driveways and fix any cracks.  Remove anything that might make you trip as you walk through a door, such as a raised step or threshold.  Trim any bushes or trees on the path to your home.  Use bright outdoor lighting.  Clear any walking paths of anything that might make someone trip, such as rocks or tools.  Regularly check to see if handrails are loose or broken. Make sure that both sides of any steps have handrails.  Any raised decks and porches should have guardrails on the edges.  Have any leaves, snow, or ice cleared regularly.  Use sand or salt on walking paths during winter.  Clean up any spills in your garage right away. This includes oil or grease spills. What can I do in the bathroom?  Use night lights.  Install grab bars by the toilet and in the tub and shower. Do not use towel bars as grab bars.  Use non-skid mats or decals in the tub or shower.  If you need to sit down in the shower, use a plastic, non-slip stool.  Keep the floor dry. Clean up any water that spills on  the floor as soon as it happens.  Remove soap buildup in the tub or shower regularly.  Attach bath mats securely with double-sided non-slip rug tape.  Do not have throw rugs and other things on the floor that can make you trip. What can I do in the bedroom?  Use night lights.  Make sure that you have a light by your bed that is easy to reach.  Do not use any sheets or blankets that are too big for your bed. They should not hang down onto the floor.  Have a firm chair that has side arms. You can use this for support while you get dressed.  Do not have throw rugs and other things on the floor that can make you trip. What can I do in the kitchen?  Clean up any spills right away.  Avoid walking on wet floors.  Keep items that you use a lot in easy-to-reach places.  If you need to reach something above you, use a strong step stool that has a grab bar.  Keep electrical cords out of the way.  Do not use floor polish or wax that makes floors slippery. If you must use wax, use non-skid floor  wax.  Do not have throw rugs and other things on the floor that can make you trip. What can I do with my stairs?  Do not leave any items on the stairs.  Make sure that there are handrails on both sides of the stairs and use them. Fix handrails that are broken or loose. Make sure that handrails are as long as the stairways.  Check any carpeting to make sure that it is firmly attached to the stairs. Fix any carpet that is loose or worn.  Avoid having throw rugs at the top or bottom of the stairs. If you do have throw rugs, attach them to the floor with carpet tape.  Make sure that you have a light switch at the top of the stairs and the bottom of the stairs. If you do not have them, ask someone to add them for you. What else can I do to help prevent falls?  Wear shoes that:  Do not have high heels.  Have rubber bottoms.  Are comfortable and fit you well.  Are closed at the toe. Do not  wear sandals.  If you use a stepladder:  Make sure that it is fully opened. Do not climb a closed stepladder.  Make sure that both sides of the stepladder are locked into place.  Ask someone to hold it for you, if possible.  Clearly mark and make sure that you can see:  Any grab bars or handrails.  First and last steps.  Where the edge of each step is.  Use tools that help you move around (mobility aids) if they are needed. These include:  Canes.  Walkers.  Scooters.  Crutches.  Turn on the lights when you go into a dark area. Replace any light bulbs as soon as they burn out.  Set up your furniture so you have a clear path. Avoid moving your furniture around.  If any of your floors are uneven, fix them.  If there are any pets around you, be aware of where they are.  Review your medicines with your doctor. Some medicines can make you feel dizzy. This can increase your chance of falling. Ask your doctor what other things that you can do to help prevent falls. This information is not intended to replace advice given to you by your health care provider. Make sure you discuss any questions you have with your health care provider. Document Released: 11/09/2008 Document Revised: 06/21/2015 Document Reviewed: 02/17/2014 Elsevier Interactive Patient Education  2017 [employer].

## 2018-11-01 NOTE — Progress Notes (Signed)
**Note DeThomas MillerIdentified via Obfuscation** Subjective:   Thomas Miller is a 68 y.o. male who presents for Medicare Annual/Subsequent preventive examination.  Review of Systems:   Cardiac Risk Factors include: advanced age (>67men, >53 women);male gender;family history of premature cardiovascular disease;hypertension;dyslipidemia    Objective:    Vitals: BP 132/68 (BP Location: Right Arm, Patient Position: Sitting, Cuff Size: Normal)   Pulse (!) 58   Temp 98.3 F (36.8 C) (Temporal)   Resp 15   Ht 5\' 9"  (1.753 m)   Wt 205 lb 11.2 oz (93.3 kg)   BMI 30.38 kg/m   Body mass index is 30.38 kg/m.  Advanced Directives 11/01/2018 01/02/2018 10/28/2017 07/18/2017 07/17/2017 10/23/2016  Does Patient Have a Medical Advance Directive? Yes No Yes Yes Yes Yes  Type of Advance Directive Living will;Healthcare Power of Attorney - Living will;Healthcare Power of 10/25/2016 Power of [place];Living will Healthcare Power of [place];Living will Healthcare Power of [place];Living will  Does patient want to make changes to medical advance directive? - - - No - Patient declined - -  Copy of Healthcare Power of Attorney in Chart? No - copy requested - No - copy requested No - copy requested No - copy requested No - copy requested  Would patient like information on creating a medical advance directive? - No - Patient declined - - - -    Tobacco Social History   Tobacco Use  Smoking Status Never Smoker  Smokeless Tobacco Never Used     Counseling given: Not Answered   Clinical Intake:  PreThomas Millervisit preparation completed: Yes  Pain : 0Thomas Miller10 Pain Score: 5  Pain Type: Acute pain Pain Location: Hip Pain Orientation: Right Pain Descriptors / Indicators: Sharp, Nagging Pain Onset: 1 to 4 weeks ago Pain Frequency: Intermittent Pain Relieving Factors: nagging all the time, sharp when moving. gets better throughout the day.  Pain Relieving Factors: nagging all the time, sharp when moving. gets better throughout the day.  Nutritional  Risks: None Diabetes: No  How often do you need to have someone help you when you read instructions, pamphlets, or other written materials from your doctor or pharmacy?: 1 - Never What is the last grade level you completed in school?: associates  Interpreter Needed?: No  Information entered by :: Tiffany Hill,LPN  Past Medical History:  Diagnosis Date  . Gout   . Hearing loss   . Hypertension   . Kidney stones   . Reflux   . S/P epidural steroid injection 05/2018   Past Surgical History:  Procedure Laterality Date  . CORONARY ARTERY BYPASS GRAFT N/A 07/20/2017   Procedure: CORONARY ARTERY BYPASS GRAFTING (CABG) times four using left internal mammary artery to LAD and endoscopically harvested right saphenous vein graft to distal circumflex, intermediate, and PD;  Surgeon: 07/22/2017, MD;  Location: [facility] OR;  Service: Open Heart Surgery;  Laterality: N/A;  . EPIDURAL BLOCK INJECTION  05/2018   L3injections   . EYE SURGERY Bilateral 2013  . FOOT SURGERY Bilateral    ingrown toenails  . GALLBLADDER SURGERY    . LEFT HEART CATH AND CORONARY ANGIOGRAPHY N/A 07/17/2017   Procedure: LEFT HEART CATH AND CORONARY ANGIOGRAPHY;  Surgeon: 07/19/2017, MD;  Location: [facility] INVASIVE CV LAB;  Service: Cardiovascular;  Laterality: N/A;  . SHOULDER SURGERY Right   . SPINE SURGERY     Family History  Problem Relation Age of Onset  . Diabetes Mother   . Lung cancer Father   . Stroke Paternal Grandfather  Social History   Socioeconomic History  . Marital status: Married    Spouse name: Not on file  . Number of children: Not on file  . Years of education: Not on file  . Highest education level: Associate degree: occupational, Hotel manager, or vocational program  Occupational History  . Occupation: retired  Scientific laboratory technician  . Financial resource strain: Not hard at all  . Food insecurity    Worry: Never true    Inability: Never true  . Transportation needs    Medical: No     NonThomas Millermedical: No  Tobacco Use  . Smoking status: Never Smoker  . Smokeless tobacco: Never Used  Substance and Sexual Activity  . Alcohol use: Not Currently  . Drug use: No  . Sexual activity: Not on file  Lifestyle  . Physical activity    Days per week: 5 days    Minutes per session: 30 min  . Stress: Not at all  Relationships  . Social connections    Talks on phone: More than three times a week    Gets together: More than three times a week    Attends religious service: More than 4 times per year    Active member of club or organization: No    Attends meetings of clubs or organizations: Never    Relationship status: Married  Other Topics Concern  . Not on file  Social History Narrative   Volunteers and golfs     Outpatient Encounter Medications as of 11/01/2018  Medication Sig  . allopurinol (ZYLOPRIM) 300 MG tablet Take 1 tablet (300 mg total) by mouth daily.  [name] [name] 600 MG CAPS Take 600 mg by mouth daily.  [name] aspirin EC 81 MG tablet Take 2 tablets (162 mg total) by mouth daily.  [name] atorvastatin (LIPITOR) 80 MG tablet Take 1 tablet (80 mg total) by mouth daily at 6 PM.  . esomeprazole (NEXIUM) 40 MG capsule Take 1 capsule (40 mg total) by mouth daily before breakfast.  . Liniments (DEEP BLUE RELIEF) GEL Apply topically daily.  [name] losartan (COZAAR) 25 MG tablet Take 1 tablet (25 mg total) by mouth daily.  . metoprolol tartrate (LOPRESSOR) 25 MG tablet Take 1 tablet (25 mg total) by mouth 2 (two) times daily.  [name] VITAMIN D, CHOLECALCIFEROL, PO Take 2,000 mg by mouth daily.  . nitroGLYCERIN (NITROSTAT) 0.4 MG SL tablet Place 1 tablet (0.4 mg total) under the tongue every 5 (five) minutes as needed for chest pain. (Patient not taking: Reported on 11/01/2018)   No facilityThomas Milleradministered encounter medications on file as of 11/01/2018.     Activities of Daily Living In your present state of health, do you have any difficulty performing the following activities: 11/01/2018  07/21/2018  Hearing? N -  Comment no hearing aids -  Vision? N -  Comment eyeglasses for reading- [facility] -  Difficulty concentrating or making decisions? N N  Walking or climbing stairs? Y Y  Comment due to hip pain pain in hip and knee  Dressing or bathing? N N  Doing errands, shopping? N N  Preparing Food and eating ? N N  Using the Toilet? N N  In the past six months, have you accidently leaked urine? N N  Do you have problems with loss of bowel control? N N  Managing your Medications? N N  Managing your Finances? N N  Housekeeping or managing your Housekeeping? N N  Some recent data might be hidden  Patient Care Team: Steele Sizer, MD as PCP - General (Family Medicine) Revankar, Aundra Dubin, MD as PCP - Cardiology (Cardiology) Tia Alert, MD as Consulting Physician (Neurosurgery) [name], MD as Consulting Physician (Cardiothoracic Surgery) Minor, Theadora Rama, RN as [facility] Lourena Simmonds, [facility] as Pharmacist (Pharmacist)   Assessment:   This is a routine wellness examination for Kreig.  Exercise Activities and Dietary recommendations Current Exercise Habits: Home exercise routine, Type of exercise: walking(1000 steps every morning), Time (Minutes): 15, Frequency (Times/Week): 7, Weekly Exercise (Minutes/Week): 105, Intensity: Mild, Exercise limited by: None identified  Goals    . "I want to protect my heart" (ptThomas Millerstated)     Current Barriers:  . 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  [name] HLD: atorvastatin 80 mg, LDL 74 on last check . Afib/HTN: losartan 25 mg QAM, metoprolol tartrate 25 mg BID, ASA 162 mg daily o CHADS2VASc score of 3 (age, HTN, vascular disease) o Patient notes that he has had some "left sided chest tightening" occasionally in the past few weeks, resolves with rest. Denies radiation, denies associated lightheadedness/dizziness. Has not felt he needed NTG for it o Reports home BP this morning  115/72, HR 55. Notes all SBP <130 . Gout; uric acid well controlled on last check < 6 mg/dL on allopurinol 161 mg daily   Pharmacist Clinical Goal(s):  [name] Over the next 90 days, patient will work with PharmD, primary care provider, and cardiologist to address needs related to optimized cardiovascular risk reduction  Interventions: . Comprehensive medication review performed. . Encouraged patient to schedule f/u with cardiology regarding his new left sided chest "tightening". He noted he would call soon. He understands appropriate NTG dosing . For secondary prevention of ASCVD in high risk patient, recommend targeting goal LDL <70. Patient is near goal on atorvastatin 80; consider d/c atorvastatin and start rosuvastatin 40 mg daily d/t greater efficacy on LDL lowering for optimized risk reduction . D/t CHADS2VASc >2, patient qualifies for chronic anticoagulation for stroke prevention. He is on ASA 162 mg daily, but DOAC therapy is preferred for stroke prevention in Afib. Consider discussing initiation of DOAC w/ cardiology.   Patient Self Care Activities:  . Self administers medications as prescribed . Calls pharmacy for medication refills . Calls provider office for new concerns or questions  Initial goal documentation     . DIET - INCREASE WATER INTAKE     Recommend drinking at least 6Thomas Miller8 glasses of water a day     . I would like to lose 20lbs (ptThomas Millerstated)     Current Barriers:  [name] Knowledge Deficits related to weight loss   Nurse Case Manager Clinical Goal(s):  [name] Over the next 90 days, patient will work with [facility] to address needs related to weight loss goal of 20 lbs  Interventions:  . Discussed plans with patient for ongoing care management follow up and provided patient with direct contact information for care management team . Encouraged patient to take advantage of in season fruits and vegetables . Discussed the dangers of fried foods . Encouraged patient to continue his exercise  habits of biking, walking and golfing.   . Reviewed weight loss strategies . Patient stated he continues to want to reduce weight. Verbalized most recent weight 202lbs.   Patient Self Care Activities:  . Performs ADL's independently . Performs IADL's independently  Initial goal documentation     . My goal is to have less pain in  my hip, knee and chest muscle (ptThomas Millerstated)     Current Barriers:  [name] Knowledge Deficits related to dealing with chronic pain  Nurse Case Manager Clinical Goal(s):  [name] Over the next 90 days, patient will work with [facility], Cardiologist and Orthopedic MD  to address needs related to pain to right hip and knee and Left chest  Interventions:  . Advised patient to to call Cardiologist to report episode of Afib and left chest muscle pain.  . Discussed plans with patient for ongoing care management follow up and provided patient with direct contact information for care management team . Reviewed scheduled/upcoming provider appointments including:  Cardiology appt 8/31.  . Encouraged patient to continue with moderate exercise routine and monitoring of b/p, weight and HR. [name] Discussed timing and onset of pain, discussed writing these factors down to discuss with MD at his f/u appointment. Patient plans to take recorded heart rates, blood pressures and weights for MD to evaluate.  . Patient was working during our call and stated his pain was really the same, no worse no better.    Patient Self Care Activities:  . Currently UNABLE TO independently perform all social activities related to pain   . Attends church or other social activities . Performs ADL's independently . Performs IADL's independently  Please see past updates related to this goal by clicking on the "Past Updates" button in the selected goal         Fall Risk: Fall Risk  11/01/2018 07/21/2018 10/28/2017 08/17/2017 06/03/2017  Falls in the past year? 1 0 No No No  Number falls in past yr: 0 0 - - -  Injury with  Fall? 0 0 - - -  Comment just some hip discomfort - - - -    FALL RISK PREVENTION PERTAINING TO THE HOME:  Any stairs in or around the home? Yes  If so, are there any without handrails? No   Home free of loose throw rugs in walkways, pet beds, electrical cords, etc? Yes  Adequate lighting in your home to reduce risk of falls? Yes   ASSISTIVE DEVICES UTILIZED TO PREVENT FALLS:  Life alert? no Use of a cane, walker or w/c? No  Grab bars in the bathroom? Yes  Shower chair or bench in shower? Yes  Elevated toilet seat or a handicapped toilet? Yes   TIMED UP AND GO:  Was the test performed? Yes .  Length of time to ambulate 10 feet: 11 sec.   GAIT:  Appearance of gait: Gait steady and fast without the use of an assistive device.  Education: Fall risk prevention has been discussed.  Intervention(s) required? No  DME/home health order needed?  No   Depression Screen PHQ 2/9 Scores 11/01/2018 07/21/2018 10/28/2017 08/17/2017  PHQ - 2 Score 0 0 0 0    Cognitive Function     6CIT Screen 11/01/2018 10/28/2017 10/23/2016  What Year? 0 points 0 points 0 points  What month? 0 points 0 points 0 points  What time? 0 points 0 points 0 points  Count back from 20 0 points 0 points 0 points  Months in reverse 0 points 0 points 0 points  Repeat phrase 0 points 0 points 0 points  Total Score 0 0 0    Immunization History  Administered Date(s) Administered  . Pneumococcal ConjugateThomas Miller13 11/19/2015  . Td 11/10/2007  . Zoster 02/17/2009    Qualifies for Shingles Vaccine? Yes  Zostavax completed n/a. Due for Shingrix. Education has been provided  regarding the importance of this vaccine. Pt has been advised to call insurance company to determine out of pocket expense. Advised may also receive vaccine at local pharmacy or Health Dept. Verbalized acceptance and understanding.  Tdap: due,  declined  Flu Vaccine: Due for Flu vaccine. Does the patient want to receive this vaccine today?  No .  Education has been provided regarding the importance of this vaccine but still declined. Advised may receive this vaccine at local pharmacy or Health Dept. Aware to provide a copy of the vaccination record if obtained from local pharmacy or Health Dept. Verbalized acceptance and understanding.  Pneumococcal Vaccine: Due for Pneumococcal vaccine. Does the patient want to receive this vaccine today?  No . Education has been provided regarding the importance of this vaccine but still declined. Advised may receive this vaccine at local pharmacy or Health Dept. Aware to provide a copy of the vaccination record if obtained from local pharmacy or Health Dept. Verbalized acceptance and understanding.   Screening Tests Health Maintenance  Topic Date Due  . TETANUS/TDAP  12/17/2018 (Originally 11/09/2017)  . INFLUENZA VACCINE  04/27/2019 (Originally 08/28/2018)  . PNA vac Low Risk Adult (2 of 2 - PPSV23) 11/01/2019 (Originally 11/18/2016)  . Fecal DNA (Cologuard)  12/10/2018  . Hepatitis C Screening  Completed   Cancer Screenings:  Colorectal Screening: Completed cologuard 12/10/2015. Repeat every 3 years,   Lung Cancer Screening: (Low Dose CT Chest recommended if Age 89Thomas Miller79 years, 30 packThomas Milleryear currently smoking OR have quit w/in 15years.) does not qualify.    Additional Screening:  Hepatitis C Screening: does qualify; Completed 05/07/2015  Vision Screening: Recommended annual ophthalmology exams for early detection of glaucoma and other disorders of the eye. Is the patient up to date with their annual eye exam?  Yes  Who is the provider or what is the name of the office in which the pt attends annual eye exams? [facility]    Dental Screening: Recommended annual dental exams for proper oral hygiene  Community Resource Referral:  CRR required this visit?  No        Plan:  I have personally reviewed and addressed the Medicare Annual Wellness questionnaire and have noted the following in  the patient's chart:  A. Medical and social history B. Use of alcohol, tobacco or illicit drugs  C. Current medications and supplements D. Functional ability and status E.  Nutritional status F.  Physical activity G. Advance directives H. List of other physicians I.  Hospitalizations, surgeries, and ER visits in previous 12 months J.  Vitals K. Screenings such as hearing and vision if needed, cognitive and depression L. Referrals and appointments   In addition, I have reviewed and discussed with patient certain preventive protocols, quality metrics, and best practice recommendations. A written personalized care plan for preventive services as well as general preventive health recommendations were provided to patient.   Signed,   Collene SchlichterHill, Tiffany A, LPN  09/8/119110/05/2018 Nurse Health Advisor   Nurse Notes: none

## 2018-11-03 ENCOUNTER — Ambulatory Visit: Payer: Medicare Other

## 2018-11-16 ENCOUNTER — Telehealth: Payer: Self-pay

## 2018-12-21 ENCOUNTER — Telehealth: Payer: Self-pay

## 2019-01-14 ENCOUNTER — Telehealth: Payer: Self-pay | Admitting: *Deleted

## 2019-01-14 NOTE — Telephone Encounter (Signed)
Ok for repeat if right side and no trauma, obviously have to tell him to get in right now, can offer [place] if needed, wait list etc - offer first avail obviously.

## 2019-01-14 NOTE — Telephone Encounter (Signed)
Pt is scheduled with driver 5/82/5189. Pt has been placed on the waitlist.

## 2019-02-01 ENCOUNTER — Telehealth: Payer: Self-pay

## 2019-02-07 ENCOUNTER — Encounter: Payer: Self-pay | Admitting: Physical Medicine and Rehabilitation

## 2019-02-07 ENCOUNTER — Other Ambulatory Visit: Payer: Self-pay

## 2019-02-07 ENCOUNTER — Ambulatory Visit: Payer: Self-pay

## 2019-02-07 ENCOUNTER — Ambulatory Visit: Payer: Medicare Other | Admitting: Physical Medicine and Rehabilitation

## 2019-02-07 VITALS — BP 129/80 | HR 57 | Ht 69.5 in | Wt 205.0 lb

## 2019-02-07 DIAGNOSIS — M5416 Radiculopathy, lumbar region: Secondary | ICD-10-CM | POA: Diagnosis not present

## 2019-02-07 MED ORDER — METHYLPREDNISOLONE ACETATE 80 MG/ML IJ SUSP
40.0000 mg | Freq: Once | INTRAMUSCULAR | Status: AC
  Administered 2019-02-07: 40 mg

## 2019-02-07 NOTE — Progress Notes (Signed)
 .  Numeric Pain Rating Scale and Functional Assessment Average Pain 8   In the last MONTH (on 0-10 scale) has pain interfered with the following?  1. General activity like being  able to carry out your everyday physical activities such as walking, climbing stairs, carrying groceries, or moving a chair?  Rating(5)   +Driver, -BT, -Dye Allergies.  

## 2019-02-07 NOTE — Procedures (Signed)
Lumbosacral Transforaminal Epidural Steroid Injection - Sub-Pedicular Approach with Fluoroscopic Guidance  Patient: Thomas Miller      Date of Birth: Sep 27, 1950 MRN: [id number] PCP: Steele Sizer, MD      Visit Date: 02/07/2019   Universal Protocol:    Date/Time: 02/07/2019  Consent Given By: the patient  Position: PRONE  Additional Comments: Vital signs were monitored before and after the procedure. Patient was prepped and draped in the usual sterile fashion. The correct patient, procedure, and site was verified.   Injection Procedure Details:  Procedure Site One Meds Administered:  Meds ordered this encounter  Medications  . methylPREDNISolone acetate (DEPO-MEDROL) injection 40 mg    Laterality: Right  Location/Site:  L5-S1  Needle size: 22 G  Needle type: Spinal  Needle Placement: Transforaminal  Findings:    -Comments: Excellent flow of contrast along the nerve and into the epidural space.  Procedure Details: After squaring off the end-plates to get a true AP view, the C-arm was positioned so that an oblique view of the foramen as noted above was visualized. The target area is just inferior to the "nose of the scotty dog" or sub pedicular. The soft tissues overlying this structure were infiltrated with 2-3 ml. of 1% Lidocaine without Epinephrine.  The spinal needle was inserted toward the target using a "trajectory" view along the fluoroscope beam.  Under AP and lateral visualization, the needle was advanced so it did not puncture dura and was located close the 6 O'Clock position of the pedical in AP tracterory. Biplanar projections were used to confirm position. Aspiration was confirmed to be negative for CSF and/or blood. A 1-2 ml. volume of Isovue-250 was injected and flow of contrast was noted at each level. Radiographs were obtained for documentation purposes.   After attaining the desired flow of contrast documented above, a 0.5 to 1.0 ml test dose of 0.25%  Marcaine was injected into each respective transforaminal space.  The patient was observed for 90 seconds post injection.  After no sensory deficits were reported, and normal lower extremity motor function was noted,   the above injectate was administered so that equal amounts of the injectate were placed at each foramen (level) into the transforaminal epidural space.   Additional Comments:  The patient tolerated the procedure well Dressing: 2 x 2 sterile gauze and Band-Aid    Post-procedure details: Patient was observed during the procedure. Post-procedure instructions were reviewed.  Patient left the clinic in stable condition.

## 2019-02-07 NOTE — Progress Notes (Signed)
Thomas Miller - 69 y.o. male MRN [id number]  Date of birth: 11-07-1950  Office Visit Note: Visit Date: 02/07/2019 PCP: Guadalupe Maple, MD Referred by: Guadalupe Maple, MD  Subjective: Chief Complaint  Patient presents with  . Lower Back - Follow-up    ESI injection of L3-4   HPI:  Thomas Miller is a 69 y.o. male who comes in today For planned repeat lumbar epidural injection.  Patient was seen and June of this year at the request of Dr. Sherley Bounds for right L3 transforaminal epidural steroid injection.  Patient has had prior right L3-4 laminectomy.  MRI from this year in March.  Patient states that injection did seem to help but did not last very long but he felt like he did get some decent relief.  Symptoms are pretty classic L5 distribution.  I think the best approach is L5 transforaminal injection diagnostically.  He will follow up with Dr. Sherley Bounds depending on relief.  Patient would be a good spinal cord stimulator trial patient.  ROS Otherwise per HPI.  Assessment & Plan: Visit Diagnoses:  1. Lumbar radiculopathy     Plan: No additional findings.   Meds & Orders:  Meds ordered this encounter  Medications  . methylPREDNISolone acetate (DEPO-MEDROL) injection 40 mg    Orders Placed This Encounter  Procedures  . XR C-ARM NO REPORT  . Epidural Steroid injection    Follow-up: Return for visit to requesting physician as needed Sherley Bounds, MD).   Procedures: No procedures performed  Lumbosacral Transforaminal Epidural Steroid Injection - Sub-Pedicular Approach with Fluoroscopic Guidance  Patient: Thomas Miller      Date of Birth: [birth year] MRN: [id number] PCP: Guadalupe Maple, MD      Visit Date: 02/07/2019   Universal Protocol:    Date/Time: 02/07/2019  Consent Given By: the patient  Position: PRONE  Additional Comments: Vital signs were monitored before and after the procedure. Patient was prepped and draped in the usual sterile fashion. The correct  patient, procedure, and site was verified.   Injection Procedure Details:  Procedure Site One Meds Administered:  Meds ordered this encounter  Medications  . methylPREDNISolone acetate (DEPO-MEDROL) injection 40 mg    Laterality: Right  Location/Site:  L5-S1  Needle size: 22 G  Needle type: Spinal  Needle Placement: Transforaminal  Findings:    -Comments: Excellent flow of contrast along the nerve and into the epidural space.  Procedure Details: After squaring off the end-plates to get a true AP view, the C-arm was positioned so that an oblique view of the foramen as noted above was visualized. The target area is just inferior to the "nose of the scotty dog" or sub pedicular. The soft tissues overlying this structure were infiltrated with 2-3 ml. of 1% Lidocaine without Epinephrine.  The spinal needle was inserted toward the target using a "trajectory" view along the fluoroscope beam.  Under AP and lateral visualization, the needle was advanced so it did not puncture dura and was located close the 6 O'Clock position of the pedical in AP tracterory. Biplanar projections were used to confirm position. Aspiration was confirmed to be negative for CSF and/or blood. A 1-2 ml. volume of Isovue-250 was injected and flow of contrast was noted at each level. Radiographs were obtained for documentation purposes.   After attaining the desired flow of contrast documented above, a 0.5 to 1.0 ml test dose of 0.25% Marcaine was injected into each respective transforaminal space.  The  patient was observed for 90 seconds post injection.  After no sensory deficits were reported, and normal lower extremity motor function was noted,   the above injectate was administered so that equal amounts of the injectate were placed at each foramen (level) into the transforaminal epidural space.   Additional Comments:  The patient tolerated the procedure well Dressing: 2 x 2 sterile gauze and Band-Aid     Post-procedure details: Patient was observed during the procedure. Post-procedure instructions were reviewed.  Patient left the clinic in stable condition.     Clinical History: MRI LUMBAR SPINE WITHOUT AND WITH CONTRAST  TECHNIQUE: Multiplanar and multiecho pulse sequences of the lumbar spine were obtained without and with intravenous contrast.  CONTRAST: 20 mL MultiHance intravenous contrast.  COMPARISON: MRI lumbar spine dated December 22, 2016.  FINDINGS: Segmentation: Standard.  Alignment: Unchanged trace retrolisthesis at L3-L4.  Vertebrae: No fracture, evidence of discitis, or bone lesion. Chronic degenerative fatty marrow endplate changes at L4-L5.  Conus medullaris and cauda equina: Conus extends to the L2 level. Conus and cauda equina appear normal. No intradural enhancement.  Paraspinal and other soft tissues: Negative.  Disc levels:  T12-L1: No significant disc bulge or herniation. No spinal canal or neuroforaminal stenosis.  L1-L2: No significant disc bulge or herniation. No spinal canal or neuroforaminal stenosis.  L2-L3: Unchanged mild disc bulging and bilateral facet arthropathy. No stenosis.  L3-L4: Interval left hemilaminectomy and microdiscectomy. Persistent diffuse disc bulging and endplate spurring. Unchanged moderate bilateral facet arthropathy. Residual mild spinal canal and right lateral recess stenosis. Unchanged moderate bilateral neuroforaminal stenosis.  L4-L5: Unchanged mild disc bulging and moderate bilateral facet arthropathy. Unchanged mild right lateral recess and mild to moderate right neuroforaminal stenosis. No spinal canal or left neuroforaminal stenosis.  L5-S1: Slightly increased shallow central disc protrusion. Unchanged moderate bilateral facet arthropathy. No stenosis.  IMPRESSION: 1. Postsurgical changes at L3-L4 with improved spinal canal and lateral recess stenosis, now mild. Unchanged moderate  bilateral neuroforaminal stenosis. 2. Unchanged mild to moderate right neuroforaminal stenosis at L4-L5.   Electronically Signed  By: Obie Dredge M.D.  On: 04/20/2018 10:13     Objective:  VS:  HT:5' 9.5" (176.5 cm)   WT:205 lb (93 kg)  BMI:29.85    BP:129/80  HR:(!) 57bpm  TEMP: ( )  RESP:  Physical Exam  Ortho Exam Imaging: No results found.

## 2019-03-15 ENCOUNTER — Other Ambulatory Visit: Payer: Self-pay | Admitting: Neurological Surgery

## 2019-03-15 ENCOUNTER — Telehealth: Payer: Self-pay | Admitting: Nurse Practitioner

## 2019-03-15 ENCOUNTER — Other Ambulatory Visit: Payer: Self-pay

## 2019-03-15 DIAGNOSIS — E7800 Pure hypercholesterolemia, unspecified: Secondary | ICD-10-CM

## 2019-03-15 DIAGNOSIS — M5416 Radiculopathy, lumbar region: Secondary | ICD-10-CM | POA: Diagnosis not present

## 2019-03-15 DIAGNOSIS — M1000 Idiopathic gout, unspecified site: Secondary | ICD-10-CM

## 2019-03-15 MED ORDER — LOSARTAN POTASSIUM 25 MG PO TABS: 25.0000 mg | ORAL_TABLET | Freq: Every day | ORAL | 0 refills | Status: DC

## 2019-03-15 MED ORDER — ATORVASTATIN CALCIUM 80 MG PO TABS: 80.0000 mg | ORAL_TABLET | Freq: Every day | ORAL | 0 refills | Status: DC

## 2019-03-15 MED ORDER — ALLOPURINOL 300 MG PO TABS: 300.0000 mg | ORAL_TABLET | Freq: Every day | ORAL | 0 refills | Status: DC

## 2019-03-15 MED ORDER — METOPROLOL TARTRATE 25 MG PO TABS: 25.0000 mg | ORAL_TABLET | Freq: Two times a day (BID) | ORAL | 0 refills | Status: DC

## 2019-03-15 NOTE — Telephone Encounter (Signed)
Refill request for Atorvastatin, Losartan, Metoprolol, Allopurinol LOV: 11/01/2018 Next Appt: none

## 2019-03-15 NOTE — Telephone Encounter (Signed)
Phone call to patient to verify medication list and allergies for myelogram procedure. Pt aware he will not need to hold any medications for this procedure. Pre and post procedure instructions reviewed with pt. Pt verbalized understanding.  

## 2019-03-17 ENCOUNTER — Other Ambulatory Visit: Payer: Medicare Other

## 2019-03-17 ENCOUNTER — Inpatient Hospital Stay: Admission: RE | Admit: 2019-03-17 | Payer: Medicare Other | Source: Ambulatory Visit

## 2019-03-18 ENCOUNTER — Telehealth: Payer: Self-pay

## 2019-03-21 ENCOUNTER — Other Ambulatory Visit: Payer: Self-pay

## 2019-03-21 ENCOUNTER — Ambulatory Visit
Admission: RE | Admit: 2019-03-21 | Discharge: 2019-03-21 | Disposition: A | Discharge status: Home / Self Care | Payer: Medicare Other | Source: Ambulatory Visit | Attending: Neurological Surgery | Admitting: Neurological Surgery

## 2019-03-21 DIAGNOSIS — M48061 Spinal stenosis, lumbar region without neurogenic claudication: Secondary | ICD-10-CM | POA: Diagnosis not present

## 2019-03-21 DIAGNOSIS — M5416 Radiculopathy, lumbar region: Secondary | ICD-10-CM

## 2019-03-21 DIAGNOSIS — M5126 Other intervertebral disc displacement, lumbar region: Secondary | ICD-10-CM | POA: Diagnosis not present

## 2019-03-21 DIAGNOSIS — M4316 Spondylolisthesis, lumbar region: Secondary | ICD-10-CM | POA: Diagnosis not present

## 2019-03-21 MED ORDER — DIAZEPAM 5 MG PO TABS: 5.0000 mg | ORAL_TABLET | Freq: Once | ORAL | Status: DC

## 2019-03-21 MED ORDER — IOPAMIDOL (ISOVUE-M 200) INJECTION 41%
15.0000 mL | Freq: Once | INTRAMUSCULAR | Status: AC
  Administered 2019-03-21: 15 mL via INTRATHECAL

## 2019-03-21 NOTE — Discharge Instructions (Signed)

## 2019-03-28 ENCOUNTER — Other Ambulatory Visit: Payer: Self-pay

## 2019-03-28 ENCOUNTER — Ambulatory Visit (INDEPENDENT_AMBULATORY_CARE_PROVIDER_SITE_OTHER): Payer: Medicare Other | Admitting: Family Medicine

## 2019-03-28 ENCOUNTER — Encounter: Payer: Self-pay | Admitting: Family Medicine

## 2019-03-28 VITALS — BP 124/77 | HR 56 | Temp 98.0°F | Ht 69.0 in | Wt 207.0 lb

## 2019-03-28 DIAGNOSIS — K219 Gastro-esophageal reflux disease without esophagitis: Secondary | ICD-10-CM

## 2019-03-28 DIAGNOSIS — E782 Mixed hyperlipidemia: Secondary | ICD-10-CM | POA: Diagnosis not present

## 2019-03-28 DIAGNOSIS — I480 Paroxysmal atrial fibrillation: Secondary | ICD-10-CM | POA: Diagnosis not present

## 2019-03-28 DIAGNOSIS — I10 Essential (primary) hypertension: Secondary | ICD-10-CM | POA: Diagnosis not present

## 2019-03-28 DIAGNOSIS — I2510 Atherosclerotic heart disease of native coronary artery without angina pectoris: Secondary | ICD-10-CM

## 2019-03-28 DIAGNOSIS — M1000 Idiopathic gout, unspecified site: Secondary | ICD-10-CM | POA: Diagnosis not present

## 2019-03-28 NOTE — Assessment & Plan Note (Signed)
Stable and under good control, continue current regimen 

## 2019-03-28 NOTE — Assessment & Plan Note (Signed)
Stable, recheck uric acid and continue current regimen. No recent gout flares

## 2019-03-28 NOTE — Assessment & Plan Note (Signed)
Followed by Cardiology, appt there at the end of the week. Continue current regimen in meantime

## 2019-03-28 NOTE — Assessment & Plan Note (Signed)
Stable and well controlled, continue current regimen 

## 2019-03-28 NOTE — Progress Notes (Signed)
BP 124/77   Pulse (!) 56   Temp 98 F (36.7 C) (Oral)   Ht 5\' 9"  (1.753 m)   Wt 207 lb (93.9 kg)   SpO2 98%   BMI 30.57 kg/m    Subjective:    Patient ID: Thomas Miller, male    DOB: 1950/05/22, 69 y.o.   MRN: [id number]  HPI: Thomas Miller is a 69 y.o. male  Chief Complaint  Patient presents with  . Hypertension  . Hyperlipidemia  . Gastroesophageal Reflux   Here today for 6 month f/u chronic conditions.   Seeing Cardiologist Friday, followed for HTN, atrial fibrillation, CAD, HLD. Tolerating medications well without side effects. Denies CP, SOB, HAs, dizziness, palpitations. Trying to eat well and stay active.   GERD - under good control with nexium, no breakthrough sxs  Gout - no recent gout flares. Takes allopurinol daily and tries to watch diet.   Relevant past medical, surgical, family and social history reviewed and updated as indicated. Interim medical history since our last visit reviewed. Allergies and medications reviewed and updated.  Review of Systems  Per HPI unless specifically indicated above     Objective:    BP 124/77   Pulse (!) 56   Temp 98 F (36.7 C) (Oral)   Ht 5\' 9"  (1.753 m)   Wt 207 lb (93.9 kg)   SpO2 98%   BMI 30.57 kg/m   Wt Readings from Last 3 Encounters:  03/28/19 207 lb (93.9 kg)  02/07/19 205 lb (93 kg)  11/01/18 205 lb 11.2 oz (93.3 kg)    Physical Exam Vitals and nursing note reviewed.  Constitutional:      Appearance: Normal appearance.  HENT:     Head: Atraumatic.  Eyes:     Extraocular Movements: Extraocular movements intact.     Conjunctiva/sclera: Conjunctivae normal.  Cardiovascular:     Rate and Rhythm: Normal rate.  Pulmonary:     Effort: Pulmonary effort is normal.     Breath sounds: Normal breath sounds.  Abdominal:     General: Bowel sounds are normal. There is no distension.     Palpations: Abdomen is soft.     Tenderness: There is no abdominal tenderness.  Musculoskeletal:        General:  Normal range of motion.     Cervical back: Normal range of motion and neck supple.  Skin:    General: Skin is warm and dry.  Neurological:     General: No focal deficit present.     Mental Status: He is oriented to person, place, and time.  Psychiatric:        Mood and Affect: Mood normal.        Thought Content: Thought content normal.        Judgment: Judgment normal.     Results for orders placed or performed in visit on 09/27/18  Lipid Profile  Result Value Ref Range   Cholesterol, Total 144 100 - 199 mg/dL   Triglycerides 01/01/19 (H) 0 - 149 mg/dL   HDL 38 (L) 09/29/18 mg/dL   VLDL Cholesterol Cal 33 5 - 40 mg/dL   LDL Chol Calc (NIH) 73 0 - 99 mg/dL   Lipid Comment: CANCELED    Chol/HDL Ratio 3.8 0.0 - 5.0 ratio      Assessment & Plan:   Problem List Items Addressed This Visit      Cardiovascular and Mediastinum   Hypertension - Primary    Stable and under  good control, continue current regimen      Relevant Orders   Comprehensive metabolic panel   PAF (paroxysmal atrial fibrillation) ([place])    Followed by Cardiology, appt there at the end of the week. Continue current regimen in meantime      CAD (coronary artery disease)    Followed by Cardiology, continue per their recommendations. Labs today        Digestive   GERD (gastroesophageal reflux disease)    Stable and well controlled, continue current regimen        Other   Gout    Stable, recheck uric acid and continue current regimen. No recent gout flares      Relevant Orders   Uric acid   Hyperlipemia    Recheck lipids, adjust as needed. Continue current regimen      Relevant Orders   Lipid Panel w/o Chol/HDL Ratio       Follow up plan: Return in about 6 months (around 09/28/2019) for CPE.

## 2019-03-28 NOTE — Assessment & Plan Note (Signed)
Recheck lipids, adjust as needed. Continue current regimen 

## 2019-03-28 NOTE — Assessment & Plan Note (Signed)
Followed by Cardiology, continue per their recommendations. Labs today

## 2019-03-29 LAB — COMPREHENSIVE METABOLIC PANEL
ALT: 23 IU/L (ref 0–44)
AST: 23 IU/L (ref 0–40)
Albumin/Globulin Ratio: 2.2 (ref 1.2–2.2)
Albumin: 4.7 g/dL (ref 3.8–4.8)
Alkaline Phosphatase: 96 IU/L (ref 39–117)
BUN/Creatinine Ratio: 13 (ref 10–24)
BUN: 14 mg/dL (ref 8–27)
Bilirubin Total: 0.6 mg/dL (ref 0.0–1.2)
CO2: 22 mmol/L (ref 20–29)
Calcium: 9.8 mg/dL (ref 8.6–10.2)
Chloride: 103 mmol/L (ref 96–106)
Creatinine, Ser: 1.07 mg/dL (ref 0.76–1.27)
GFR calc Af Amer: 81 mL/min/{1.73_m2} (ref >59)
GFR calc non Af Amer: 70 mL/min/{1.73_m2} (ref >59)
Globulin, Total: 2.1 g/dL (ref 1.5–4.5)
Glucose: 70 mg/dL (ref 65–99)
Potassium: 4.4 mmol/L (ref 3.5–5.2)
Sodium: 141 mmol/L (ref 134–144)
Total Protein: 6.8 g/dL (ref 6.0–8.5)

## 2019-03-29 LAB — LIPID PANEL W/O CHOL/HDL RATIO
Cholesterol, Total: 124 mg/dL (ref 100–199)
HDL: 42 mg/dL (ref >39)
LDL Chol Calc (NIH): 55 mg/dL (ref 0–99)
Triglycerides: 161 mg/dL — ABNORMAL HIGH (ref 0–149)
VLDL Cholesterol Cal: 27 mg/dL (ref 5–40)

## 2019-03-29 LAB — URIC ACID: Uric Acid: 3.2 mg/dL — ABNORMAL LOW (ref 3.8–8.4)

## 2019-03-31 DIAGNOSIS — M5416 Radiculopathy, lumbar region: Secondary | ICD-10-CM | POA: Diagnosis not present

## 2019-04-01 ENCOUNTER — Encounter: Payer: Self-pay | Admitting: Cardiology

## 2019-04-01 ENCOUNTER — Other Ambulatory Visit: Payer: Self-pay

## 2019-04-01 ENCOUNTER — Ambulatory Visit (INDEPENDENT_AMBULATORY_CARE_PROVIDER_SITE_OTHER): Payer: Medicare Other | Admitting: Cardiology

## 2019-04-01 VITALS — BP 120/88 | HR 64 | Ht 69.0 in | Wt 211.0 lb

## 2019-04-01 DIAGNOSIS — Z951 Presence of aortocoronary bypass graft: Secondary | ICD-10-CM

## 2019-04-01 DIAGNOSIS — Z01810 Encounter for preprocedural cardiovascular examination: Secondary | ICD-10-CM

## 2019-04-01 DIAGNOSIS — I2510 Atherosclerotic heart disease of native coronary artery without angina pectoris: Secondary | ICD-10-CM

## 2019-04-01 DIAGNOSIS — I480 Paroxysmal atrial fibrillation: Secondary | ICD-10-CM

## 2019-04-01 NOTE — Progress Notes (Signed)
Cardiology Office Note:    Date:  04/01/2019   ID:  Thomas Miller, DOB 1950/07/06, MRN [id number]  PCP:  Steele Sizer, MD  Cardiologist:  Garwin Brothers, MD   Referring MD: Steele Sizer, MD    ASSESSMENT:    1. Preoperative cardiovascular examination   2. Coronary artery disease involving native coronary artery of native heart without angina pectoris   3. PAF (paroxysmal atrial fibrillation) ([facility])   4. S/P CABG x 4    PLAN:    In order of problems listed above:  1. Coronary artery disease: Secondary prevention stressed with the patient.  Importance of compliance with diet and medication stressed and he vocalized understanding. 2. Essential hypertension: Blood pressure is stable 3. Mixed dyslipidemia: Lipids were reviewed and they were found to be fine 4. Preoperative assessment: In view of excellent effort tolerance is not at high risk for coronary events during the aforementioned surgery.  Meticulous hemodynamic monitoring will further reduce the risk of coronary events.  I explained to him the evaluation process for preoperative assessment and questions were answered to his satisfaction. 5. Patient will be seen in follow-up appointment in 6 months or earlier if the patient has any concerns 6. It would be preferred for him to continue aspirin in an intermittent fashion which is a coated baby aspirin on a daily basis however if it is mandatory to stop it for the spinal surgery then it could be stopped for the minimum amount of time that is thought appropriate by the surgeon.   Medication Adjustments/Labs and Tests Ordered: Current medicines are reviewed at length with the patient today.  Concerns regarding medicines are outlined above.  No orders of the defined types were placed in this encounter.  No orders of the defined types were placed in this encounter.    Chief Complaint  Patient presents with  . Follow-up     History of Present Illness:    Thomas Miller  is a 69 y.o. male.  Patient has past medical history of coronary artery disease post CABG surgery, essential hypertension and dyslipidemia.  He is planning to undergo spine surgery.  He mentions to me that he has significant issues with sciatica.  He tells me that he has excellent effort tolerance.  I asked him about this repeatedly.  He tells me that he walks about 3 miles a day without any problem.  He tells me that he walks more than a mile at a time without any symptoms.  He has about 5 acres of land with up and down hill and he walks that with no problem.  Only when he sleeps he has issues with his back which is why he is contemplating surgery.  At the time of my evaluation, the patient is alert awake oriented and in no distress.  Past Medical History:  Diagnosis Date  . Gout   . Hearing loss   . Hypertension   . Kidney stones   . Reflux   . S/P epidural steroid injection 05/2018    Past Surgical History:  Procedure Laterality Date  . CORONARY ARTERY BYPASS GRAFT N/A 07/20/2017   Procedure: CORONARY ARTERY BYPASS GRAFTING (CABG) times four using left internal mammary artery to LAD and endoscopically harvested right saphenous vein graft to distal circumflex, intermediate, and PD;  Surgeon: [name], MD;  [facility];  Service: Open Heart Surgery;  Laterality: N/A;  . EPIDURAL BLOCK INJECTION  05/2018   L3injections   .  EYE SURGERY Bilateral 2013  . FOOT SURGERY Bilateral    ingrown toenails  . GALLBLADDER SURGERY    . LEFT HEART CATH AND CORONARY ANGIOGRAPHY N/A 07/17/2017   Procedure: LEFT HEART CATH AND CORONARY ANGIOGRAPHY;  Surgeon: Yvonne Kendall, MD;  Location: [facility] INVASIVE CV LAB;  Service: Cardiovascular;  Laterality: N/A;  . SHOULDER SURGERY Right   . SPINE SURGERY      Current Medications: Current Meds  Medication Sig  . allopurinol (ZYLOPRIM) 300 MG tablet Take 1 tablet (300 mg total) by mouth daily.  [name] Tengan Cider Vinegar 600 MG CAPS Take 600 mg by mouth  daily.  [name] aspirin EC 81 MG tablet Take 2 tablets (162 mg total) by mouth daily.  [name] atorvastatin (LIPITOR) 80 MG tablet Take 1 tablet (80 mg total) by mouth daily at 6 PM.  . esomeprazole (NEXIUM) 40 MG capsule Take 1 capsule (40 mg total) by mouth daily before breakfast.  . Liniments (DEEP BLUE RELIEF) GEL Apply topically daily.  [name] losartan (COZAAR) 25 MG tablet Take 1 tablet (25 mg total) by mouth daily.  . metoprolol tartrate (LOPRESSOR) 25 MG tablet Take 1 tablet (25 mg total) by mouth 2 (two) times daily.  . nitroGLYCERIN (NITROSTAT) 0.4 MG SL tablet Place 1 tablet (0.4 mg total) under the tongue every 5 (five) minutes as needed for chest pain.  [name] VITAMIN D, CHOLECALCIFEROL, PO Take 2,000 mg by mouth daily.     Allergies:   Ace inhibitors, Codeine, and Naprosyn [naproxen]   Social History   Socioeconomic History  . Marital status: Married    Spouse name: Not on file  . Number of children: Not on file  . Years of education: Not on file  . Highest education level: Associate degree: occupational, Scientist, product/process development, or vocational program  Occupational History  . Occupation: retired  Tobacco Use  . Smoking status: Never Smoker  . Smokeless tobacco: Never Used  Substance and Sexual Activity  . Alcohol use: Not Currently  . Drug use: No  . Sexual activity: Not on file  Other Topics Concern  . Not on file  Social History Narrative   Volunteers and golfs    Social Determinants of Health   Financial Resource Strain:   . Difficulty of Paying Living Expenses: Not on file  Food Insecurity:   . Worried About Programme researcher, broadcasting/film/video in the Last Year: Not on file  . Ran Out of Food in the Last Year: Not on file  Transportation Needs:   . Lack of Transportation (Medical): Not on file  . Lack of Transportation (Non-Medical): Not on file  Physical Activity:   . Days of Exercise per Week: Not on file  . Minutes of Exercise per Session: Not on file  Stress:   . Feeling of Stress : Not on file    Social Connections:   . Frequency of Communication with Friends and Family: Not on file  . Frequency of Social Gatherings with Friends and Family: Not on file  . Attends Religious Services: Not on file  . Active Member of Clubs or Organizations: Not on file  . Attends Banker Meetings: Not on file  . Marital Status: Not on file     Family History: The patient's family history includes Diabetes in his mother; Lung cancer in his father; Stroke in his paternal grandfather.  ROS:   Please see the history of present illness.    All other systems reviewed and are negative.  EKGs/Labs/Other Studies  Reviewed:    The following studies were reviewed today: I discussed my findings with the patient at length.   Recent Labs: 03/28/2019: ALT 23; BUN 14; Creatinine, Ser 1.07; Potassium 4.4; Sodium 141  Recent Lipid Panel    Component Value Date/Time   CHOL 124 03/28/2019 1512   CHOL 142 06/03/2017 0811   TRIG 161 (H) 03/28/2019 1512   TRIG 123 06/03/2017 0811   HDL 42 03/28/2019 1512   CHOLHDL 3.8 09/27/2018 0903   CHOLHDL 3.0 07/18/2017 0249   VLDL 10 07/18/2017 0249   VLDL 25 06/03/2017 0811   LDLCALC 55 03/28/2019 1512    Physical Exam:    VS:  BP 120/88   Pulse 64   Ht 5\' 9"  (1.753 m)   Wt 211 lb (95.7 kg)   SpO2 98%   BMI 31.16 kg/m     Wt Readings from Last 3 Encounters:  04/01/19 211 lb (95.7 kg)  03/28/19 207 lb (93.9 kg)  02/07/19 205 lb (93 kg)     GEN: Patient is in no acute distress HEENT: Normal NECK: No JVD; No carotid bruits LYMPHATICS: No lymphadenopathy CARDIAC: Hear sounds regular, 2/6 systolic murmur at the apex. RESPIRATORY:  Clear to auscultation without rales, wheezing or rhonchi  ABDOMEN: Soft, non-tender, non-distended MUSCULOSKELETAL:  No edema; No deformity  SKIN: Warm and dry NEUROLOGIC:  Alert and oriented x 3 PSYCHIATRIC:  Normal affect   Signed, Jenean Lindau, MD  04/01/2019 8:57 AM    [place]

## 2019-04-01 NOTE — Patient Instructions (Signed)

## 2019-04-12 ENCOUNTER — Ambulatory Visit (INDEPENDENT_AMBULATORY_CARE_PROVIDER_SITE_OTHER): Payer: Medicare Other | Admitting: General Practice

## 2019-04-12 ENCOUNTER — Telehealth: Payer: Medicare Other | Admitting: General Practice

## 2019-04-12 DIAGNOSIS — I10 Essential (primary) hypertension: Secondary | ICD-10-CM

## 2019-04-12 DIAGNOSIS — Z1152 Encounter for screening for COVID-19: Secondary | ICD-10-CM | POA: Diagnosis not present

## 2019-04-12 DIAGNOSIS — I480 Paroxysmal atrial fibrillation: Secondary | ICD-10-CM | POA: Diagnosis not present

## 2019-04-12 DIAGNOSIS — E782 Mixed hyperlipidemia: Secondary | ICD-10-CM

## 2019-04-12 DIAGNOSIS — I2510 Atherosclerotic heart disease of native coronary artery without angina pectoris: Secondary | ICD-10-CM | POA: Diagnosis not present

## 2019-04-12 DIAGNOSIS — G894 Chronic pain syndrome: Secondary | ICD-10-CM

## 2019-04-12 NOTE — Patient Instructions (Signed)
Visit Information  Goals Addressed            This Visit's Progress   . RNCM: I would like to lose 20lbs (pt-stated)       Current Barriers:  [name] Knowledge Deficits related to weight loss   Nurse Case Manager Clinical Goal(s):  [name] Over the next 120 days, patient will work with [facility] to address needs related to weight loss goal of 20 lbs  Interventions:  . Discussed plans with patient for ongoing care management follow up and provided patient with direct contact information for care management team . Encouraged patient to take advantage of in season fruits and vegetables . Discussed the dangers of fried foods . Encouraged patient to continue his exercise habits of biking, walking and golfing- limited ability to do this currently due to sciatica pain and discomfort. Surgery scheduled next week   . Reviewed weight loss strategies . Patient stated he continues to want to reduce weight. Verbalized most recent weight 205 lbs.   Patient Self Care Activities:  . Performs ADL's independently . Performs IADL's independently  Please see past updates related to this goal by clicking on the "Past Updates" button in the selected goal      . RNCM: My goal is to have less pain in my hip, knee and chest muscle (pt-stated)       Current Barriers:  [name] Knowledge Deficits related to dealing with chronic pain  Nurse Case Manager Clinical Goal(s):  [name] Over the next 90 days, patient will work with [facility], Cardiologist and Orthopedic MD  to address needs related to pain to right hip and knee- the patient is scheduled for outpatient surgery next week   Interventions:  . Advised patient to to call Cardiologist to report episode of Afib and left chest muscle pain.  . Discussed plans with patient for ongoing care management follow up and provided patient with direct contact information for care management team . Reviewed scheduled/upcoming provider appointments including:  Saw cardiology and pcp last week.  Scheduled for surgery next week to see if can relieve the sciatica pain he is having  . Encouraged patient to continue with moderate exercise routine and monitoring of b/p, weight and HR. [name] Discussed timing and onset of pain, discussed writing these factors down to discuss with MD at his f/u appointment. Patient plans to take recorded heart rates, blood pressures and weights for MD to evaluate.  . Evaluation of pain: Patient stated his pain was gradually getting worse over the last year with little to no relief. The patient is hopeful the surgery will help his pain level decrease significantly.   Patient Self Care Activities:  . Currently UNABLE TO independently perform all social activities related to pain   . Attends church or other social activities . Performs ADL's independently . Performs IADL's independently  Please see past updates related to this goal by clicking on the "Past Updates" button in the selected goal      . RNCM: Pt- "I can tell when my Afib is acting up" (pt-stated)       [place] (see longtitudinal plan of care for additional care plan information)  Current Barriers:  . Chronic Disease Management support, education, and care coordination needs related to Atrial Fibrillation, CAD, HTN, and HLD  Clinical Goal(s) related to Atrial Fibrillation, CAD, HTN, and HLD:  Over the next 120 days, patient will:  . Work with the care management team to address educational, disease management, and care  coordination needs  . Begin or continue self health monitoring activities as directed today Measure and record blood pressure 3 times per week and adhere to a heart healthy diet.  Increase activity as prescribed after surgical procedure . Call provider office for new or worsened signs and symptoms Blood pressure findings outside established parameters, Shortness of breath, and New or worsened symptom related to AFIB/CAD/HLD . Call care management team with questions or  concerns . Verbalize basic understanding of patient centered plan of care established today  Interventions related to Atrial Fibrillation, CAD, HTN, and HLD:  . Evaluation of current treatment plans and patient's adherence to plan as established by provider . Assessed patient understanding of disease states . 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 Knows resources are available as needed   . Provided disease specific education to patient- checking blood pressures, regular activity for heart health and adherence to a heart healthy diet  . Collaborated with appropriate clinical care team members regarding patient needs- knows resources available from the CCM team: LCSW and pharmacist available for support.  Patient Self Care Activities related to Atrial Fibrillation, CAD, HTN, and HLD:  . Patient is unable to independently self-manage chronic health conditions  Initial goal documentation        Patient verbalizes understanding of instructions provided today.   The care management team will reach out to the patient again over the next 60 days.   Alto Denver RN, MSN, CCM Community Care Coordinator [place]  [facility] Mobile: [phone]  DASH Eating Plan DASH stands for "Dietary Approaches to Stop Hypertension." The DASH eating plan is a healthy eating plan that has been shown to reduce high blood pressure (hypertension). It may also reduce your risk for type 2 diabetes, heart disease, and stroke. The DASH eating plan may also help with weight loss. What are tips for following this plan?  General guidelines  Avoid eating more than 2,300 mg (milligrams) of salt (sodium) a day. If you have hypertension, you may need to reduce your sodium intake to 1,500 mg a day.  Limit alcohol intake to no more than 1 drink a day for  nonpregnant women and 2 drinks a day for men. One drink equals 12 oz of beer, 5 oz of wine, or 1 oz of hard liquor.  Work with your health care provider to maintain a healthy body weight or to lose weight. Ask what an ideal weight is for you.  Get at least 30 minutes of exercise that causes your heart to beat faster (aerobic exercise) most days of the week. Activities may include walking, swimming, or biking.  Work with your health care provider or diet and nutrition specialist (dietitian) to adjust your eating plan to your individual calorie needs. Reading food labels   Check food labels for the amount of sodium per serving. Choose foods with less than 5 percent of the Daily Value of sodium. Generally, foods with less than 300 mg of sodium per serving fit into this eating plan.  To find whole grains, look for the word "whole" as the first word in the ingredient list. Shopping  Buy products labeled as "low-sodium" or "no salt added."  Buy fresh foods. Avoid canned foods and premade or frozen meals. Cooking  Avoid adding salt when cooking. Use salt-free seasonings or herbs instead of table salt or sea  salt. Check with your health care provider or pharmacist before using salt substitutes.  Do not fry foods. Cook foods using healthy methods such as baking, boiling, grilling, and broiling instead.  Cook with heart-healthy oils, such as olive, canola, soybean, or sunflower oil. Meal planning  Eat a balanced diet that includes: ? 5 or more servings of fruits and vegetables each day. At each meal, try to fill half of your plate with fruits and vegetables. ? Up to 6-8 servings of whole grains each day. ? Less than 6 oz of lean meat, poultry, or fish each day. A 3-oz serving of meat is about the same size as a deck of cards. One egg equals 1 oz. ? 2 servings of low-fat dairy each day. ? A serving of nuts, seeds, or beans 5 times each week. ? Heart-healthy fats. Healthy fats called Omega-3  fatty acids are found in foods such as flaxseeds and coldwater fish, like sardines, salmon, and mackerel.  Limit how much you eat of the following: ? Canned or prepackaged foods. ? Food that is high in trans fat, such as fried foods. ? Food that is high in saturated fat, such as fatty meat. ? Sweets, desserts, sugary drinks, and other foods with added sugar. ? Full-fat dairy products.  Do not salt foods before eating.  Try to eat at least 2 vegetarian meals each week.  Eat more home-cooked food and less restaurant, buffet, and fast food.  When eating at a restaurant, ask that your food be prepared with less salt or no salt, if possible. What foods are recommended? The items listed may not be a complete list. Talk with your dietitian about what dietary choices are best for you. Grains Whole-grain or whole-wheat bread. Whole-grain or whole-wheat pasta. Brown rice. Orpah Cobb. Bulgur. Whole-grain and low-sodium cereals. Pita bread. Low-fat, low-sodium crackers. Whole-wheat flour tortillas. Vegetables Fresh or frozen vegetables (raw, steamed, roasted, or grilled). Low-sodium or reduced-sodium tomato and vegetable juice. Low-sodium or reduced-sodium tomato sauce and tomato paste. Low-sodium or reduced-sodium canned vegetables. Fruits All fresh, dried, or frozen fruit. Canned fruit in natural juice (without added sugar). Meat and other protein foods Skinless chicken or [place]. Ground chicken or [place]. Pork with fat trimmed off. Fish and seafood. Egg whites. Dried beans, peas, or lentils. Unsalted nuts, nut butters, and seeds. Unsalted canned beans. Lean cuts of beef with fat trimmed off. Low-sodium, lean deli meat. Dairy Low-fat (1%) or fat-free (skim) milk. Fat-free, low-fat, or reduced-fat cheeses. Nonfat, low-sodium ricotta or cottage cheese. Low-fat or nonfat yogurt. Low-fat, low-sodium cheese. Fats and oils Soft margarine without trans fats. Vegetable oil. Low-fat, reduced-fat, or  light mayonnaise and salad dressings (reduced-sodium). Canola, safflower, olive, soybean, and sunflower oils. Avocado. Seasoning and other foods Herbs. Spices. Seasoning mixes without salt. Unsalted popcorn and pretzels. Fat-free sweets. What foods are not recommended? The items listed may not be a complete list. Talk with your dietitian about what dietary choices are best for you. Grains Baked goods made with fat, such as croissants, muffins, or some breads. Dry pasta or rice meal packs. Vegetables Creamed or fried vegetables. Vegetables in a cheese sauce. Regular canned vegetables (not low-sodium or reduced-sodium). Regular canned tomato sauce and paste (not low-sodium or reduced-sodium). Regular tomato and vegetable juice (not low-sodium or reduced-sodium). Rosita Fire. Olives. Fruits Canned fruit in a light or heavy syrup. Fried fruit. Fruit in cream or butter sauce. Meat and other protein foods Fatty cuts of meat. Ribs. Fried meat. Tomasa Blase. Sausage. Bologna and other  processed lunch meats. Salami. Fatback. Hotdogs. Bratwurst. Salted nuts and seeds. Canned beans with added salt. Canned or smoked fish. Whole eggs or egg yolks. Chicken or [place] with skin. Dairy Whole or 2% milk, cream, and half-and-half. Whole or full-fat cream cheese. Whole-fat or sweetened yogurt. Full-fat cheese. Nondairy creamers. Whipped toppings. Processed cheese and cheese spreads. Fats and oils Butter. Stick margarine. Lard. Shortening. Ghee. Bacon fat. Tropical oils, such as coconut, palm kernel, or palm oil. Seasoning and other foods Salted popcorn and pretzels. Onion salt, garlic salt, seasoned salt, table salt, and sea salt. Worcestershire sauce. Tartar sauce. Barbecue sauce. Teriyaki sauce. Soy sauce, including reduced-sodium. Steak sauce. Canned and packaged gravies. Fish sauce. Oyster sauce. Cocktail sauce. Horseradish that you find on the shelf. Ketchup. Mustard. Meat flavorings and tenderizers. Bouillon cubes. Hot  sauce and Tabasco sauce. Premade or packaged marinades. Premade or packaged taco seasonings. Relishes. Regular salad dressings. Where to find more information:  National Heart, Lung, and Blood Institute: PopSteam.is  American Heart Association: www.heart.org Summary  The DASH eating plan is a healthy eating plan that has been shown to reduce high blood pressure (hypertension). It may also reduce your risk for type 2 diabetes, heart disease, and stroke.  With the DASH eating plan, you should limit salt (sodium) intake to 2,300 mg a day. If you have hypertension, you may need to reduce your sodium intake to 1,500 mg a day.  When on the DASH eating plan, aim to eat more fresh fruits and vegetables, whole grains, lean proteins, low-fat dairy, and heart-healthy fats.  Work with your health care provider or diet and nutrition specialist (dietitian) to adjust your eating plan to your individual calorie needs. This information is not intended to replace advice given to you by your health care provider. Make sure you discuss any questions you have with your health care provider. Document Revised: 12/26/2016 Document Reviewed: 01/07/2016 Elsevier Patient Education  2020 [employer].

## 2019-04-12 NOTE — Chronic Care Management (AMB) (Signed)
Chronic Care Management   Follow Up Note   04/12/2019 Name: Thomas Miller MRN: [id number] DOB: 12/03/1950  Referred by: Guadalupe Maple, MD Reason for referral : Chronic Care Management (Afib, HTN, CAD, HLD, Chronic pain)   Thomas Miller is a 69 y.o. year old male who is a primary care patient of Crissman, Jeannette How, MD. The CCM team was consulted for assistance with chronic disease management and care coordination needs.    Review of patient status, including review of consultants reports, relevant laboratory and other test results, and collaboration with appropriate care team members and the patient's provider was performed as part of comprehensive patient evaluation and provision of chronic care management services.    SDOH (Social Determinants of Health) assessments performed: Yes See Care Plan activities for detailed interventions related to SDOH)  SDOH Interventions     Most Recent Value  SDOH Interventions  SDOH Interventions for the Following Domains  Physical Activity  Physical Activity Interventions  Other (Comments) [the patient is having surgery next week to help with sciatic pain and the patient is hopeful this will help with the chronic pain and he can do more activity]  Alcohol Brief Interventions/Follow-up  AUDIT Score <7 follow-up not indicated       Outpatient Encounter Medications as of 04/12/2019  Medication Sig  . allopurinol (ZYLOPRIM) 300 MG tablet Take 1 tablet (300 mg total) by mouth daily.  [name] Quam Cider Vinegar 600 MG CAPS Take 600 mg by mouth daily.  [name] aspirin EC 81 MG tablet Take 2 tablets (162 mg total) by mouth daily.  [name] atorvastatin (LIPITOR) 80 MG tablet Take 1 tablet (80 mg total) by mouth daily at 6 PM.  . esomeprazole (NEXIUM) 40 MG capsule Take 1 capsule (40 mg total) by mouth daily before breakfast.  . Liniments (DEEP BLUE RELIEF) GEL Apply topically daily.  [name] losartan (COZAAR) 25 MG tablet Take 1 tablet (25 mg total) by mouth daily.  . metoprolol  tartrate (LOPRESSOR) 25 MG tablet Take 1 tablet (25 mg total) by mouth 2 (two) times daily.  . nitroGLYCERIN (NITROSTAT) 0.4 MG SL tablet Place 1 tablet (0.4 mg total) under the tongue every 5 (five) minutes as needed for chest pain.  [name] VITAMIN D, CHOLECALCIFEROL, PO Take 2,000 mg by mouth daily.   No facility-administered encounter medications on file as of 04/12/2019.     Objective:   BP Readings from Last 3 Encounters:  04/01/19 120/88  03/28/19 124/77  03/21/19 (!) 143/83   Goals Addressed            This Visit's Progress   . RNCM: I would like to lose 20lbs (pt-stated)       Current Barriers:  [name] Knowledge Deficits related to weight loss   Nurse Case Manager Clinical Goal(s):  [name] Over the next 120 days, patient will work with [facility] to address needs related to weight loss goal of 20 lbs  Interventions:  . Discussed plans with patient for ongoing care management follow up and provided patient with direct contact information for care management team . Encouraged patient to take advantage of in season fruits and vegetables . Discussed the dangers of fried foods . Encouraged patient to continue his exercise habits of biking, walking and golfing- limited ability to do this currently due to sciatica pain and discomfort. Surgery scheduled next week   . Reviewed weight loss strategies . Patient stated he continues to want to reduce weight. Verbalized most recent weight 205  lbs.   Patient Self Care Activities:  . Performs ADL's independently . Performs IADL's independently  Please see past updates related to this goal by clicking on the "Past Updates" button in the selected goal      . RNCM: My goal is to have less pain in my hip, knee and chest muscle (pt-stated)       Current Barriers:  [name] Knowledge Deficits related to dealing with chronic pain  Nurse Case Manager Clinical Goal(s):  [name] Over the next 90 days, patient will work with [facility], Cardiologist and Orthopedic MD  to address  needs related to pain to right hip and knee- the patient is scheduled for outpatient surgery next week   Interventions:  . Advised patient to to call Cardiologist to report episode of Afib and left chest muscle pain.  . Discussed plans with patient for ongoing care management follow up and provided patient with direct contact information for care management team . Reviewed scheduled/upcoming provider appointments including:  Saw cardiology and pcp last week. Scheduled for surgery next week to see if can relieve the sciatica pain he is having  . Encouraged patient to continue with moderate exercise routine and monitoring of b/p, weight and HR. [name] Discussed timing and onset of pain, discussed writing these factors down to discuss with MD at his f/u appointment. Patient plans to take recorded heart rates, blood pressures and weights for MD to evaluate.  . Evaluation of pain: Patient stated his pain was gradually getting worse over the last year with little to no relief. The patient is hopeful the surgery will help his pain level decrease significantly.   Patient Self Care Activities:  . Currently UNABLE TO independently perform all social activities related to pain   . Attends church or other social activities . Performs ADL's independently . Performs IADL's independently  Please see past updates related to this goal by clicking on the "Past Updates" button in the selected goal      . RNCM: Pt- "I can tell when my Afib is acting up" (pt-stated)       CARE PLAN ENTRY (see longtitudinal plan of care for additional care plan information)  Current Barriers:  . Chronic Disease Management support, education, and care coordination needs related to Atrial Fibrillation, CAD, HTN, and HLD  Clinical Goal(s) related to Atrial Fibrillation, CAD, HTN, and HLD:  Over the next 120 days, patient will:  . Work with the care management team to address educational, disease management, and care coordination needs   . Begin or continue self health monitoring activities as directed today Measure and record blood pressure 3 times per week and adhere to a heart healthy diet.  Increase activity as prescribed after surgical procedure . Call provider office for new or worsened signs and symptoms Blood pressure findings outside established parameters, Shortness of breath, and New or worsened symptom related to AFIB/CAD/HLD . Call care management team with questions or concerns . Verbalize basic understanding of patient centered plan of care established today  Interventions related to Atrial Fibrillation, CAD, HTN, and HLD:  . Evaluation of current treatment plans and patient's adherence to plan as established by provider . Assessed patient understanding of disease states . 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 Knows resources are available as needed   . Provided disease specific education to patient- checking blood pressures, regular activity for heart  health and adherence to a heart healthy diet  . Collaborated with appropriate clinical care team members regarding patient needs- knows resources available from the CCM team: LCSW and pharmacist available for support.  Patient Self Care Activities related to Atrial Fibrillation, CAD, HTN, and HLD:  . Patient is unable to independently self-manage chronic health conditions  Initial goal documentation         Plan:   The care management team will reach out to the patient again over the next 60 days.    Alto Denver RN, MSN, CCM Community Care Coordinator [place]  [facility] Mobile: [phone]

## 2019-04-15 ENCOUNTER — Other Ambulatory Visit: Payer: Self-pay

## 2019-04-15 DIAGNOSIS — K219 Gastro-esophageal reflux disease without esophagitis: Secondary | ICD-10-CM

## 2019-04-15 MED ORDER — ESOMEPRAZOLE MAGNESIUM 40 MG PO CPDR: 40.0000 mg | DELAYED_RELEASE_CAPSULE | Freq: Every day | ORAL | 1 refills | Status: DC

## 2019-04-15 NOTE — Telephone Encounter (Signed)
Patient last seen 03/28/19.

## 2019-04-18 DIAGNOSIS — M48061 Spinal stenosis, lumbar region without neurogenic claudication: Secondary | ICD-10-CM | POA: Diagnosis not present

## 2019-04-18 DIAGNOSIS — M5416 Radiculopathy, lumbar region: Secondary | ICD-10-CM | POA: Diagnosis not present

## 2019-05-04 DIAGNOSIS — Z1211 Encounter for screening for malignant neoplasm of colon: Secondary | ICD-10-CM | POA: Diagnosis not present

## 2019-05-04 LAB — COLOGUARD: Cologuard: NEGATIVE

## 2019-05-11 LAB — COLOGUARD: COLOGUARD: NEGATIVE

## 2019-05-30 DIAGNOSIS — H43812 Vitreous degeneration, left eye: Secondary | ICD-10-CM | POA: Diagnosis not present

## 2019-06-01 ENCOUNTER — Telehealth: Payer: Medicare Other

## 2019-06-01 ENCOUNTER — Ambulatory Visit: Payer: Self-pay | Admitting: General Practice

## 2019-06-01 NOTE — Chronic Care Management (AMB) (Signed)
  Chronic Care Management   Outreach Note  06/01/2019 Name: Thomas Miller MRN: [id number] DOB: December 25, 1950  Referred by: Steele Sizer, MD Reason for referral : Chronic Care Management (Follow up on chronic Disease Management and Care Coordiantion needs. )   An unsuccessful telephone outreach was attempted today. The patient was referred to the case management team for assistance with care management and care coordination.   Follow Up Plan: A HIPPA compliant phone message was left for the patient providing contact information and requesting a return call.   Alto Denver RN, MSN, CCM Community Care Coordinator [place]  [facility] Mobile: [phone]

## 2019-06-13 DIAGNOSIS — H43812 Vitreous degeneration, left eye: Secondary | ICD-10-CM | POA: Diagnosis not present

## 2019-06-17 ENCOUNTER — Telehealth: Payer: Medicare Other

## 2019-06-17 ENCOUNTER — Ambulatory Visit: Payer: Self-pay | Admitting: General Practice

## 2019-06-17 NOTE — Chronic Care Management (AMB) (Signed)
°  Chronic Care Management   Outreach Note  06/17/2019 Name: Thomas Miller MRN: [id number] DOB: 07/20/1950  Referred by: Steele Sizer, MD Reason for referral : Chronic Care Management (Follow up on Chronic Conditions and care coordination needs- 2nd attempt. )   A second unsuccessful telephone outreach was attempted today. The patient was referred to the case management team for assistance with care management and care coordination. The patient was out playing golf and was unable to talk to the [facility] today. Ask for a call at another time.   Follow Up Plan: The care management team will reach out to the patient again over the next 60  days.   Alto Denver RN, MSN, CCM Community Care Coordinator [place]   [facility] Mobile: [phone]

## 2019-06-20 ENCOUNTER — Other Ambulatory Visit: Payer: Self-pay | Admitting: Nurse Practitioner

## 2019-06-20 DIAGNOSIS — E7800 Pure hypercholesterolemia, unspecified: Secondary | ICD-10-CM

## 2019-06-20 NOTE — Telephone Encounter (Signed)
Requested Prescriptions  Pending Prescriptions Disp Refills  . atorvastatin (LIPITOR) 80 MG tablet [Pharmacy Med Name: ATORVASTATIN 80 MG TABLET] 90 tablet 0    Sig: Take 1 tablet (80 mg total) by mouth daily at 6 PM.     Cardiovascular:  Antilipid - Statins Failed - 06/20/2019 11:56 AM      Failed - LDL in normal range and within 360 days    LDL Chol Calc (NIH)  Date Value Ref Range Status  03/28/2019 55 0 - 99 mg/dL Final         Failed - Triglycerides in normal range and within 360 days    Triglycerides  Date Value Ref Range Status  03/28/2019 161 (H) 0 - 149 mg/dL Final   Triglycerides Piccolo,Waived  Date Value Ref Range Status  06/03/2017 123 <150 mg/dL Final    Comment:                            Normal                   <150                         Borderline High     150 - 199                         High                200 - 499                         Very High                >499          Passed - Total Cholesterol in normal range and within 360 days    Cholesterol, Total  Date Value Ref Range Status  03/28/2019 124 100 - 199 mg/dL Final   Cholesterol Piccolo, Waived  Date Value Ref Range Status  06/03/2017 142 <200 mg/dL Final    Comment:                            Desirable                <200                         Borderline High      200- 239                         High                     >239          Passed - HDL in normal range and within 360 days    HDL  Date Value Ref Range Status  03/28/2019 42 >39 mg/dL Final         Passed - Patient is not pregnant      Passed - Valid encounter within last 12 months    Recent Outpatient Visits          2 months ago Essential hypertension   [facility] Roosvelt Maser [place], [place]   11 months ago Paroxysmal atrial fibrillation [facility])   [facility]  Guadalupe Maple, MD   1 year ago Idiopathic gout, unspecified chronicity, unspecified site   [facility],  Jeannette How, [place]   1 year ago Essential hypertension   [place], Jeannette How, [place]   1 year ago Shortness of breath   [facility] Trinna Post, PA-C      Future Appointments            In 3 months Orene Desanctis, Lilia Argue, PA-C [facility], [place]   In 3 months  [employer], [place]

## 2019-06-28 ENCOUNTER — Other Ambulatory Visit: Payer: Self-pay | Admitting: Nurse Practitioner

## 2019-06-28 DIAGNOSIS — M1000 Idiopathic gout, unspecified site: Secondary | ICD-10-CM

## 2019-07-11 DIAGNOSIS — H43812 Vitreous degeneration, left eye: Secondary | ICD-10-CM | POA: Diagnosis not present

## 2019-08-17 ENCOUNTER — Ambulatory Visit (INDEPENDENT_AMBULATORY_CARE_PROVIDER_SITE_OTHER): Payer: Medicare Other | Admitting: General Practice

## 2019-08-17 ENCOUNTER — Telehealth: Payer: Medicare Other | Admitting: General Practice

## 2019-08-17 DIAGNOSIS — I2510 Atherosclerotic heart disease of native coronary artery without angina pectoris: Secondary | ICD-10-CM

## 2019-08-17 DIAGNOSIS — I10 Essential (primary) hypertension: Secondary | ICD-10-CM

## 2019-08-17 DIAGNOSIS — I480 Paroxysmal atrial fibrillation: Secondary | ICD-10-CM | POA: Diagnosis not present

## 2019-08-17 DIAGNOSIS — E782 Mixed hyperlipidemia: Secondary | ICD-10-CM | POA: Diagnosis not present

## 2019-08-17 NOTE — Patient Instructions (Signed)
Visit Information  Goals Addressed              This Visit's Progress   .  RNCM: I would like to lose 20lbs (pt-stated)        Current Barriers:  [name] Knowledge Deficits related to weight loss   Nurse Case Manager Clinical Goal(s):  [name] Over the next 120 days, patient will work with [facility] to address needs related to weight loss goal of 20 lbs  Interventions:  . Discussed plans with patient for ongoing care management follow up and provided patient with direct contact information for care management team . Encouraged patient to take advantage of in season fruits and vegetables . Discussed the dangers of fried foods . Encouraged patient to continue his exercise habits of biking, walking and golfing- limited ability to do this currently due to sciatica pain and discomfort. 08-17-2019: The patient is doing well with activity level. He plays golf every Tuesday and Thursday and on Monday's once a month. The patient verbalized her walked 13,000 steps yesterday that equates to 3.4 miles. 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  He says "we are doing okay right now"   . Reviewed weight loss strategies . Patient stated he continues to want to reduce weight. Verbalized most recent weight 207 lbs.   Patient Self Care Activities:  . Performs ADL's independently . Performs IADL's independently  Please see past updates related to this goal by clicking on the "Past Updates" button in the selected goal      .  COMPLETED: RNCM: My goal is to have less pain in my hip, knee and chest muscle (pt-stated)        Current Barriers: Goal completed. The patient had surgery in March 2021 and is doing well now.  . Knowledge Deficits related to dealing with chronic pain  Nurse Case Manager Clinical Goal(s):  [name] Over the next 90 days, patient will work with [facility], Cardiologist and Orthopedic MD  to address needs related to pain to right hip and knee- the patient is scheduled for outpatient surgery  next week   Interventions:  . Advised patient to to call Cardiologist to report episode of Afib and left chest muscle pain.  . Discussed plans with patient for ongoing care management follow up and provided patient with direct contact information for care management team . Reviewed scheduled/upcoming provider appointments including:  Saw cardiology and pcp last week. Scheduled for surgery next week to see if can relieve the sciatica pain he is having  . Encouraged patient to continue with moderate exercise routine and monitoring of b/p, weight and HR. [name] Discussed timing and onset of pain, discussed writing these factors down to discuss with MD at his f/u appointment. Patient plans to take recorded heart rates, blood pressures and weights for MD to evaluate.  . Evaluation of pain: Patient stated his pain was gradually getting worse over the last year with little to no relief. The patient is hopeful the surgery will help his pain level decrease significantly.   Patient Self Care Activities:  . Currently UNABLE TO independently perform all social activities related to pain   . Attends church or other social activities . Performs ADL's independently . Performs IADL's independently  Please see past updates related to this goal by clicking on the "Past Updates" button in the selected goal      .  RNCM: Pt- "I can tell when my Afib is acting up" (pt-stated)  CARE PLAN ENTRY (see longtitudinal plan of care for additional care plan information)  Current Barriers:  . Chronic Disease Management support, education, and care coordination needs related to Atrial Fibrillation, CAD, HTN, and HLD  Clinical Goal(s) related to Atrial Fibrillation, CAD, HTN, and HLD:  Over the next 120 days, patient will:  . Work with the care management team to address educational, disease management, and care coordination needs  . Begin or continue self health monitoring activities as directed today Measure and  record blood pressure 3 times per week and adhere to a heart healthy diet.  Increase activity as prescribed after surgical procedure . Call provider office for new or worsened signs and symptoms Blood pressure findings outside established parameters, Shortness of breath, and New or worsened symptom related to AFIB/CAD/HLD . Call care management team with questions or concerns . Verbalize basic understanding of patient centered plan of care established today  Interventions related to Atrial Fibrillation, CAD, HTN, and HLD:  . Evaluation of current treatment plans and patient's adherence to plan as established by provider.  The patient states he is doing well at this time. Denies any new concerns related to his health. Had an episode of AFIB about 4 weeks ago on the golf [place]. It was hot and had been "a long day".  The patient states that he sees his cardiologist regularly. Next appointment in September.  . Assessed patient understanding of disease states.  Has a good understanding of chronic diseases.  . 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 Knows resources are available as needed. 08-17-2019: The patient states he can tell his wife is declining but he is still able to care for her without difficulty. He says they are doing okay right now.   . Provided disease specific education to patient- checking blood pressures, regular activity for heart health and adherence to a heart healthy diet.  The patient is weighing regularly: wt today is 207.  The patient takes his blood pressure regularly: 127/75.  The patient does not always follow a heart healthy diet but he doesn't over do it with the sodium and fats. The patient is more active and playing golf at least 2 times a week. The patient feels he is at a good place with his health.  Steele Sizer with appropriate clinical care team  members regarding patient needs- knows resources available from the CCM team: LCSW and pharmacist available for support. . Evaluation of upcoming appointments. The patient has a follow up on 09-28-2019 with the pcp and has a card appointment in September as well.   Patient Self Care Activities related to Atrial Fibrillation, CAD, HTN, and HLD:  . Patient is unable to independently self-manage chronic health conditions  Please see past updates related to this goal by clicking on the "Past Updates" button in the selected goal         Patient verbalizes understanding of instructions provided today.   The care management team will reach out to the patient again over the next 60 to 90 days.   Alto Denver RN, MSN, CCM Community Care Coordinator [place]  [facility] Mobile: [phone]

## 2019-08-17 NOTE — Chronic Care Management (AMB) (Signed)
Chronic Care Management   Follow Up Note   08/17/2019 Name: Thomas Miller MRN: [id number] DOB: 05-08-1950  Referred by: Steele Sizer, MD Reason for referral : Chronic Care Management (RNCM Follow up Call: Chronic Disease Managment and Care Coordination Needs)   Thomas Miller is a 69 y.o. year old male who is a primary care patient of Crissman, Redge Gainer, MD. The CCM team was consulted for assistance with chronic disease management and care coordination needs.    Review of patient status, including review of consultants reports, relevant laboratory and other test results, and collaboration with appropriate care team members and the patient's provider was performed as part of comprehensive patient evaluation and provision of chronic care management services.    SDOH (Social Determinants of Health) assessments performed: Yes See Care Plan activities for detailed interventions related to SDOH)  SDOH Interventions     Most Recent Value  SDOH Interventions  Physical Activity Interventions Other (Comments)  [the patient plays gold every Tuesday and Thursday and once a month on Monday's]       Outpatient Encounter Medications as of 08/17/2019  Medication Sig   allopurinol (ZYLOPRIM) 300 MG tablet Take 1 tablet (300 mg total) by mouth daily.   [place] Cider Vinegar 600 MG CAPS Take 600 mg by mouth daily.   aspirin EC 81 MG tablet Take 2 tablets (162 mg total) by mouth daily.   atorvastatin (LIPITOR) 80 MG tablet Take 1 tablet (80 mg total) by mouth daily at 6 PM.   esomeprazole (NEXIUM) 40 MG capsule Take 1 capsule (40 mg total) by mouth daily before breakfast.   Liniments (DEEP BLUE RELIEF) GEL Apply topically daily.   losartan (COZAAR) 25 MG tablet Take 1 tablet (25 mg total) by mouth daily.   metoprolol tartrate (LOPRESSOR) 25 MG tablet Take 1 tablet (25 mg total) by mouth 2 (two) times daily.   nitroGLYCERIN (NITROSTAT) 0.4 MG SL tablet Place 1 tablet (0.4 mg total) under the  tongue every 5 (five) minutes as needed for chest pain.   VITAMIN D, CHOLECALCIFEROL, PO Take 2,000 mg by mouth daily.   No facility-administered encounter medications on file as of 08/17/2019.     Objective:  BP Readings from Last 3 Encounters:  08/17/19 127/75  04/01/19 120/88  03/28/19 124/77    Goals Addressed              This Visit's Progress     RNCM: I would like to lose 20lbs (pt-stated)        Current Barriers:   Knowledge Deficits related to weight loss   Nurse Case Manager Clinical Goal(s):   Over the next 120 days, patient will work with [facility] to address needs related to weight loss goal of 20 lbs  Interventions:   Discussed plans with patient for ongoing care management follow up and provided patient with direct contact information for care management team  Encouraged patient to take advantage of in season fruits and vegetables  Discussed the dangers of fried foods  Encouraged patient to continue his exercise habits of biking, walking and golfing- limited ability to do this currently due to sciatica pain and discomfort. 08-17-2019: The patient is doing well with activity level. He plays golf every Tuesday and Thursday and on Monday's once a month. The patient verbalized her walked 13,000 steps yesterday that equates to 3.4 miles. 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  He says "we are doing okay right now"  Reviewed weight loss strategies  Patient stated he continues to want to reduce weight. Verbalized most recent weight 207 lbs.   Patient Self Care Activities:   Performs ADL's independently  Performs IADL's independently  Please see past updates related to this goal by clicking on the "Past Updates" button in the selected goal        COMPLETED: RNCM: My goal is to have less pain in my hip, knee and chest muscle (pt-stated)        Current Barriers: Goal completed. The patient had surgery in March 2021 and is doing well now.    Knowledge Deficits related to dealing with chronic pain  Nurse Case Manager Clinical Goal(s):   Over the next 90 days, patient will work with [facility], Cardiologist and Orthopedic MD  to address needs related to pain to right hip and knee- the patient is scheduled for outpatient surgery next week   Interventions:   Advised patient to to call Cardiologist to report episode of Afib and left chest muscle pain.   Discussed plans with patient for ongoing care management follow up and provided patient with direct contact information for care management team  Reviewed scheduled/upcoming provider appointments including:  Saw cardiology and pcp last week. Scheduled for surgery next week to see if can relieve the sciatica pain he is having   Encouraged patient to continue with moderate exercise routine and monitoring of b/p, weight and HR.  Discussed timing and onset of pain, discussed writing these factors down to discuss with MD at his f/u appointment. Patient plans to take recorded heart rates, blood pressures and weights for MD to evaluate.   Evaluation of pain: Patient stated his pain was gradually getting worse over the last year with little to no relief. The patient is hopeful the surgery will help his pain level decrease significantly.   Patient Self Care Activities:   Currently UNABLE TO independently perform all social activities related to pain    Attends church or other social activities  Performs ADL's independently  Performs IADL's independently  Please see past updates related to this goal by clicking on the "Past Updates" button in the selected goal        RNCM: Pt- "I can tell when my Afib is acting up" (pt-stated)        CARE PLAN ENTRY (see longtitudinal plan of care for additional care plan information)  Current Barriers:   Chronic Disease Management support, education, and care coordination needs related to Atrial Fibrillation, CAD, HTN, and HLD  Clinical Goal(s)  related to Atrial Fibrillation, CAD, HTN, and HLD:  Over the next 120 days, patient will:   Work with the care management team to address educational, disease management, and care coordination needs   Begin or continue self health monitoring activities as directed today Measure and record blood pressure 3 times per week and adhere to a heart healthy diet.  Increase activity as prescribed after surgical procedure  Call provider office for new or worsened signs and symptoms Blood pressure findings outside established parameters, Shortness of breath, and New or worsened symptom related to AFIB/CAD/HLD  Call care management team with questions or concerns  Verbalize basic understanding of patient centered plan of care established today  Interventions related to Atrial Fibrillation, CAD, HTN, and HLD:   Evaluation of current treatment plans and patient's adherence to plan as established by provider.  The patient states he is doing well at this time. Denies any new concerns related to his health.  Had an episode of AFIB about 4 weeks ago on the [place]. It was hot and had been "a long day".  The patient states that he sees his cardiologist regularly. Next appointment in September.   Assessed patient understanding of disease states.  Has a good understanding of chronic diseases.   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 Knows resources are available as needed. 08-17-2019: The patient states he can tell his wife is declining but he is still able to care for her without difficulty. He says they are doing okay right now.    Provided disease specific education to patient- checking blood pressures, regular activity for heart health and adherence to a heart healthy diet.  The patient is weighing regularly: wt today is 207.  The patient takes his blood pressure regularly: 127/75.  The  patient does not always follow a heart healthy diet but he doesn't over do it with the sodium and fats. The patient is more active and playing golf at least 2 times a week. The patient feels he is at a good place with his health.   Collaborated with appropriate clinical care team members regarding patient needs- knows resources available from the CCM team: LCSW and pharmacist available for support.  Evaluation of upcoming appointments. The patient has a follow up on 09-28-2019 with the pcp and has a card appointment in September as well.   Patient Self Care Activities related to Atrial Fibrillation, CAD, HTN, and HLD:   Patient is unable to independently self-manage chronic health conditions  Please see past updates related to this goal by clicking on the "Past Updates" button in the selected goal          Plan:   The care management team will reach out to the patient again over the next 60 to 90 days.    Alto Denver RN, MSN, CCM Community Care Coordinator [place]   [facility] Mobile: [phone]

## 2019-08-28 ENCOUNTER — Other Ambulatory Visit: Payer: Self-pay

## 2019-08-28 ENCOUNTER — Emergency Department: Payer: Medicare Other

## 2019-08-28 ENCOUNTER — Encounter: Payer: Self-pay | Admitting: Emergency Medicine

## 2019-08-28 ENCOUNTER — Emergency Department
Admission: EM | Admit: 2019-08-28 | Discharge: 2019-08-28 | Disposition: A | Discharge status: Home / Self Care | Payer: Medicare Other | Attending: Emergency Medicine | Admitting: Emergency Medicine

## 2019-08-28 DIAGNOSIS — I2510 Atherosclerotic heart disease of native coronary artery without angina pectoris: Secondary | ICD-10-CM | POA: Diagnosis not present

## 2019-08-28 DIAGNOSIS — I4891 Unspecified atrial fibrillation: Secondary | ICD-10-CM | POA: Diagnosis not present

## 2019-08-28 DIAGNOSIS — I10 Essential (primary) hypertension: Secondary | ICD-10-CM | POA: Diagnosis not present

## 2019-08-28 DIAGNOSIS — Z87438 Personal history of other diseases of male genital organs: Secondary | ICD-10-CM | POA: Insufficient documentation

## 2019-08-28 DIAGNOSIS — Z951 Presence of aortocoronary bypass graft: Secondary | ICD-10-CM | POA: Insufficient documentation

## 2019-08-28 DIAGNOSIS — R42 Dizziness and giddiness: Secondary | ICD-10-CM | POA: Diagnosis not present

## 2019-08-28 DIAGNOSIS — R112 Nausea with vomiting, unspecified: Secondary | ICD-10-CM | POA: Diagnosis not present

## 2019-08-28 DIAGNOSIS — R9082 White matter disease, unspecified: Secondary | ICD-10-CM | POA: Diagnosis not present

## 2019-08-28 LAB — COMPREHENSIVE METABOLIC PANEL
ALT: 27 U/L (ref 0–44)
AST: 29 U/L (ref 15–41)
Albumin: 5 g/dL (ref 3.5–5.0)
Alkaline Phosphatase: 89 U/L (ref 38–126)
Anion gap: 14 (ref 5–15)
BUN: 16 mg/dL (ref 8–23)
CO2: 19 mmol/L — ABNORMAL LOW (ref 22–32)
Calcium: 9.6 mg/dL (ref 8.9–10.3)
Chloride: 108 mmol/L (ref 98–111)
Creatinine, Ser: 1.03 mg/dL (ref 0.61–1.24)
GFR calc Af Amer: >60 mL/min (ref >60)
GFR calc non Af Amer: >60 mL/min (ref >60)
Glucose, Bld: 139 mg/dL — ABNORMAL HIGH (ref 70–99)
Potassium: 4.4 mmol/L (ref 3.5–5.1)
Sodium: 141 mmol/L (ref 135–145)
Total Bilirubin: 1.3 mg/dL — ABNORMAL HIGH (ref 0.3–1.2)
Total Protein: 8 g/dL (ref 6.5–8.1)

## 2019-08-28 LAB — LIPASE, BLOOD: Lipase: 28 U/L (ref 11–51)

## 2019-08-28 LAB — CBC
HCT: 49 % (ref 39.0–52.0)
Hemoglobin: 16.1 g/dL (ref 13.0–17.0)
MCH: 29.3 pg (ref 26.0–34.0)
MCHC: 32.9 g/dL (ref 30.0–36.0)
MCV: 89.3 fL (ref 80.0–100.0)
Platelets: 198 10*3/uL (ref 150–400)
RBC: 5.49 MIL/uL (ref 4.22–5.81)
RDW: 14 % (ref 11.5–15.5)
WBC: 10.5 10*3/uL (ref 4.0–10.5)
nRBC: 0 % (ref 0.0–0.2)

## 2019-08-28 MED ORDER — ONDANSETRON HCL 4 MG/2ML IJ SOLN
4.0000 mg | Freq: Once | INTRAMUSCULAR | Status: AC
  Administered 2019-08-28: 4 mg via INTRAVENOUS
  Filled 2019-08-28: qty 2

## 2019-08-28 MED ORDER — SODIUM CHLORIDE 0.9% FLUSH: 3.0000 mL | Freq: Once | INTRAVENOUS | Status: DC

## 2019-08-28 MED ORDER — SODIUM CHLORIDE 0.9 % IV BOLUS
1000.0000 mL | Freq: Once | INTRAVENOUS | Status: AC
  Administered 2019-08-28: 1000 mL via INTRAVENOUS

## 2019-08-28 MED ORDER — DIAZEPAM 2 MG PO TABS
2.0000 mg | ORAL_TABLET | Freq: Once | ORAL | Status: AC
  Administered 2019-08-28: 2 mg via ORAL
  Filled 2019-08-28: qty 1

## 2019-08-28 MED ORDER — MECLIZINE HCL 12.5 MG PO TABS: 12.5000 mg | ORAL_TABLET | Freq: Three times a day (TID) | ORAL | 0 refills | Status: DC | PRN

## 2019-08-28 MED ORDER — MECLIZINE HCL 25 MG PO TABS
25.0000 mg | ORAL_TABLET | Freq: Once | ORAL | Status: AC
  Administered 2019-08-28: 25 mg via ORAL
  Filled 2019-08-28: qty 1

## 2019-08-28 MED ORDER — DIAZEPAM 2 MG PO TABS: 2.0000 mg | ORAL_TABLET | Freq: Two times a day (BID) | ORAL | 0 refills | Status: DC | PRN

## 2019-08-28 NOTE — ED Notes (Signed)
Patient transported to MRI 

## 2019-08-28 NOTE — ED Triage Notes (Signed)
Pt to ED via POV c/o N/V/D, pt states that this started last night after he ate watermelon. Pt states that he has been throwing up since 10 pm. Pt reports 1 episode of diarrhea this morning. Pt has not been able to keep any fluid down. Pt reports some dizziness that started this morning as well.

## 2019-08-28 NOTE — ED Notes (Signed)
E-signature not working at this time. Pt verbalized understanding of D/C instructions, prescriptions and follow up care with no further questions at this time. Pt in NAD and ambulatory at time of D/C.  

## 2019-08-28 NOTE — Discharge Instructions (Addendum)
Please take the Antivert 1 pill t 3 times a day.  This may make you little sleepy be careful.  Do not get woozy or fall.  Do not drive on it if it aches you sleepy.  I will also give you some Valium to take.  You can take that 1 twice a day.  It can also make you woozy.  Please return for any worsening symptoms.  Please follow-up with your primary care doctor later on this coming week.  If the Valium makes you too sleepy can take it just before bed.  Again be careful not to fall with it.

## 2019-08-28 NOTE — ED Provider Notes (Signed)
[facility] Emergency Department Provider Note   ____________________________________________   First MD Initiated Contact with Patient 08/28/19 0848     (approximate)  I have reviewed the triage vital signs and the nursing notes.   HISTORY  Chief Complaint Emesis    HPI Thomas Miller is a 69 y.o. male who reports he began vomiting almost immediately after eating a watermelon yesterday.  He reports he washed the watermelon very well.  After he vomited began having dizziness and unsteady gait.  He had some diarrhea this morning.  He still vomiting and having vertigo.  He also has nystagmus.  He denies any other medical problems.  He does not have a headache.  He is able to walk but very unsteady.  Symptoms been constant since he vomited the first time.         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   Lumbar radiculopathy 11/27/2016   Advanced care planning/counseling discussion 11/27/2016   Dermatitis 08/25/2016   Insomnia 05/19/2016   Hypertension 10/25/2014   Gout 10/25/2014   GERD (gastroesophageal reflux disease) 10/25/2014   Hyperlipemia 10/25/2014   BPH (benign prostatic hyperplasia) 10/25/2014    Past Surgical History:  Procedure Laterality Date   CORONARY ARTERY BYPASS GRAFT N/A 07/20/2017   Procedure: CORONARY ARTERY BYPASS GRAFTING (CABG) times four using left internal mammary artery to LAD and endoscopically harvested right saphenous vein graft to distal circumflex, intermediate, and PD;  Surgeon: [name], MD;  Location: [facility] OR;   Service: Open Heart Surgery;  Laterality: N/A;   EPIDURAL BLOCK INJECTION  05/2018   L3injections    EYE SURGERY Bilateral 2013   FOOT SURGERY Bilateral    ingrown toenails   GALLBLADDER SURGERY     LEFT HEART CATH AND CORONARY ANGIOGRAPHY N/A 07/17/2017   Procedure: LEFT HEART CATH AND CORONARY ANGIOGRAPHY;  Surgeon: Yvonne Kendall, MD;  Location: [facility] INVASIVE CV LAB;  Service: Cardiovascular;  Laterality: N/A;   SHOULDER SURGERY Right    SPINE SURGERY      Prior to Admission medications   Medication Sig Start Date End Date Taking? Authorizing Provider  allopurinol (ZYLOPRIM) 300 MG tablet Take 1 tablet (300 mg total) by mouth daily. 06/28/19   Particia Nearing, PA-C  Hellstrom Cider Vinegar 600 MG CAPS Take 600 mg by mouth daily.    [provider]  aspirin EC 81 MG tablet Take 2 tablets (162 mg total) by mouth daily. 09/14/17   Revankar, Aundra Dubin, MD  atorvastatin (LIPITOR) 80 MG tablet Take 1 tablet (80 mg total) by mouth daily at 6 PM. 06/20/19   Maurice March, Salley Hews, PA-C  diazepam (VALIUM) 2 MG tablet Take 1 tablet (2 mg total) by mouth every 12 (twelve) hours as needed (Vertigo). 08/28/19 08/27/20  Arnaldo Natal, MD  esomeprazole (NEXIUM) 40 MG capsule Take 1 capsule (40 mg total) by mouth daily before breakfast. 04/15/19   Mardene Celeste I, NP  Liniments (DEEP BLUE RELIEF) GEL Apply topically daily.    [provider]  losartan (COZAAR) 25 MG  tablet Take 1 tablet (25 mg total) by mouth daily. 06/28/19   Particia Nearing, PA-C  meclizine (ANTIVERT) 12.5 MG tablet Take 1 tablet (12.5 mg total) by mouth 3 (three) times daily as needed for dizziness. 08/28/19   Arnaldo Natal, MD  metoprolol tartrate (LOPRESSOR) 25 MG tablet Take 1 tablet (25 mg total) by mouth 2 (two) times daily. 06/28/19   Particia Nearing, PA-C  nitroGLYCERIN (NITROSTAT) 0.4 MG SL tablet Place 1 tablet (0.4 mg total) under the tongue every 5 (five) minutes as needed for chest pain.  09/14/17 04/01/19  Revankar, Aundra Dubin, MD  VITAMIN D, CHOLECALCIFEROL, PO Take 2,000 mg by mouth daily.    [provider]    Allergies Ace inhibitors, Codeine, and Naprosyn [naproxen]  Family History  Problem Relation Age of Onset   Diabetes Mother    Lung cancer Father    Stroke Paternal Grandfather     Social History Social History   Tobacco Use   Smoking status: Never Smoker   Smokeless tobacco: Never Used  Building services engineer Use: Never used  Substance Use Topics   Alcohol use: Not Currently   Drug use: No    Review of Systems  Constitutional: No fever/chills Eyes: No visual changes. ENT: No sore throat. Cardiovascular: Denies chest pain. Respiratory: Denies shortness of breath. Gastrointestinal: No abdominal pain.   nausea, vomiting. diarrhea.  No constipation. Genitourinary: Negative for dysuria. Musculoskeletal: Negative for back pain. Skin: Negative for rash. Neurological: Negative for headaches, focal weakness   ____________________________________________   PHYSICAL EXAM:  VITAL SIGNS: ED Triage Vitals  Enc Vitals Group     BP 08/28/19 0828 (!) 139/80     Pulse Rate 08/28/19 0825 60     Resp 08/28/19 0825 16     Temp 08/28/19 0825 (!) 97.5 F (36.4 C)     Temp Source 08/28/19 0825 Oral     SpO2 08/28/19 0825 97 %     Weight 08/28/19 0827 (!) 205 lb (93 kg)     Height 08/28/19 0827 5\' 9"  (1.753 m)     Head Circumference --      Peak Flow --      Pain Score 08/28/19 0827 0     Pain Loc --      Pain Edu? --      Excl. in GC? --     Constitutional: Alert and oriented.  But looks tired and is vomiting Eyes: Conjunctivae are normal. PER. EOMI. fundi are difficult to see patient is having horizontal nystagmus Head: Atraumatic. Nose: No congestion/rhinnorhea. Mouth/Throat: Mucous membranes are moist.  Oropharynx non-erythematous. Neck: No stridor.  Cardiovascular: Normal rate, regular rhythm. Grossly normal heart sounds.  Good  peripheral circulation. Respiratory: Normal respiratory effort.  No retractions. Lungs CTAB. Gastrointestinal: Soft and nontender. No distention. No abdominal bruits. No CVA tenderness. Musculoskeletal: No lower extremity tenderness nor edema.   Neurologic:  Normal speech and language. No gross focal neurologic deficits are appreciated except for the nystagmus.  Nystagmus is horizontal.  Cranial nerves II through XII are otherwise within normal limits although visual fields were not checked cerebellar finger-nose rapid alternating movements in the hand and touching my hand with his toes are all normal.  Motor strength is 5/5 throughout and sensation patient does not report any numbness.  Thrust testing appears to show deviation.  This would imply that there is a peripheral lesion.  I say appears to show deviation because the nystagmus is fairly vigorous  and it is difficult to tell. Skin:  Skin is warm, dry and intact. No rash noted.   ____________________________________________   LABS (all labs ordered are listed, but only abnormal results are displayed)  Labs Reviewed  COMPREHENSIVE METABOLIC PANEL - Abnormal; Notable for the following components:      Result Value   CO2 19 (*)    Glucose, Bld 139 (*)    Total Bilirubin 1.3 (*)    All other components within normal limits  LIPASE, BLOOD  CBC  URINALYSIS, COMPLETE (UACMP) WITH MICROSCOPIC   ____________________________________________  EKG   ____________________________________________  RADIOLOGY  ED MD interpretation:    Official radiology report(s): MR BRAIN WO CONTRAST  Result Date: 08/28/2019 CLINICAL DATA:  Sudden onset of vertigo.  Symptoms began yesterday. EXAM: MRI HEAD WITHOUT CONTRAST TECHNIQUE: Multiplanar, multiecho pulse sequences of the brain and surrounding structures were obtained without intravenous contrast. COMPARISON:  None. FINDINGS: Brain: Diffusion imaging does not show any acute or subacute infarction.  Brainstem is normal. Old small vessel infarctions affect the inferior cerebellum on the right. Cerebral hemispheres show old infarction in the left basal ganglia. Old small left occipital cortical infarction. Minor changes of small vessel disease elsewhere in the hemispheric white matter. No large vessel territory infarction. No mass lesion, hemorrhage hydrocephalus or extra-axial collection. Vascular: Major vessels at the base of the brain show flow. Skull and upper cervical spine: Negative Sinuses/Orbits: Clear/normal Other: None IMPRESSION: No acute or subacute finding. 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 Old small left occipital cortical infarction. Electronically Signed   By: Paulina Fusi M.D.   On: 08/28/2019 10:56    ____________________________________________   PROCEDURES  Procedure(s) performed (including Critical Care):  Procedures   ____________________________________________   INITIAL IMPRESSION / ASSESSMENT AND PLAN / ED COURSE Patient with vertigo.  MRI shows several old strokes but nothing acute.  Patient's vertigo improves with treatment.  This and the MRI together with the positive thrust or impulse testing seems to indicate that this is a peripheral problem.  Since he is now able to walk and is not vomiting any longer I will let him go.  He will return for any further problems and follow-up with his primary care doctor.             ____________________________________________   FINAL CLINICAL IMPRESSION(S) / ED DIAGNOSES  Final diagnoses:  Non-intractable vomiting with nausea, unspecified vomiting type  Vertigo     ED Discharge Orders         Ordered    meclizine (ANTIVERT) 12.5 MG tablet  3 times daily PRN     Discontinue  Reprint     08/28/19 1220    diazepam (VALIUM) 2 MG tablet  Every 12 hours PRN     Discontinue  Reprint     08/28/19 1220           Note:  This document was  prepared using Dragon voice recognition software and may include unintentional dictation errors.    Arnaldo Natal, MD 08/28/19 1534

## 2019-09-02 ENCOUNTER — Ambulatory Visit (INDEPENDENT_AMBULATORY_CARE_PROVIDER_SITE_OTHER): Payer: Medicare Other | Admitting: Family Medicine

## 2019-09-02 ENCOUNTER — Other Ambulatory Visit: Payer: Self-pay

## 2019-09-02 ENCOUNTER — Ambulatory Visit: Payer: Self-pay

## 2019-09-02 ENCOUNTER — Encounter: Payer: Self-pay | Admitting: Family Medicine

## 2019-09-02 VITALS — BP 158/94 | HR 53 | Temp 97.6°F | Wt 211.0 lb

## 2019-09-02 DIAGNOSIS — R42 Dizziness and giddiness: Secondary | ICD-10-CM

## 2019-09-02 MED ORDER — DIAZEPAM 2 MG PO TABS: 2.0000 mg | ORAL_TABLET | Freq: Two times a day (BID) | ORAL | 0 refills | Status: DC | PRN

## 2019-09-02 MED ORDER — MECLIZINE HCL 12.5 MG PO TABS: 12.5000 mg | ORAL_TABLET | Freq: Three times a day (TID) | ORAL | 0 refills | Status: DC | PRN

## 2019-09-02 NOTE — Progress Notes (Signed)
BP (!) 158/94   Pulse (!) 53   Temp 97.6 F (36.4 C) (Oral)   Wt 211 lb (95.7 kg)   SpO2 97%   BMI 31.16 kg/m    Subjective:    Patient ID: Thomas Miller, male    DOB: 22-Sep-1950, 68 y.o.   MRN: [id number]  HPI: Thomas Miller is a 69 y.o. male  Chief Complaint  Patient presents with  . Dizziness    pt was at the ED las Sunday   Here today following up on vertigo sxs he's had about a week now. Went to ER over weekend for this and given meclizine and valium which has been helping some. Trying to make slow steady movements. Some lightheadedness and feeling groggy from the medications but overall feeling some better and no longer having the room spinning sensations or as much nausea. Denies CP, SOB, HAs, confusion, diaphoresis, syncope. Doing epley maneuvers at home several times daily.   Relevant past medical, surgical, family and social history reviewed and updated as indicated. Interim medical history since our last visit reviewed. Allergies and medications reviewed and updated.  Review of Systems  Per HPI unless specifically indicated above     Objective:    BP (!) 158/94   Pulse (!) 53   Temp 97.6 F (36.4 C) (Oral)   Wt 211 lb (95.7 kg)   SpO2 97%   BMI 31.16 kg/m   Wt Readings from Last 3 Encounters:  09/02/19 211 lb (95.7 kg)  08/28/19 (!) 205 lb (93 kg)  08/17/19 207 lb (93.9 kg)    Physical Exam Vitals and nursing note reviewed.  Constitutional:      Appearance: Normal appearance.  HENT:     Head: Atraumatic.  Eyes:     Extraocular Movements: Extraocular movements intact.     Conjunctiva/sclera: Conjunctivae normal.  Cardiovascular:     Rate and Rhythm: Normal rate and regular rhythm.  Pulmonary:     Effort: Pulmonary effort is normal.     Breath sounds: Normal breath sounds.  Abdominal:     General: Bowel sounds are normal. There is no distension.     Palpations: Abdomen is soft.     Tenderness: There is no abdominal tenderness. There is no  right CVA tenderness, left CVA tenderness or guarding.  Musculoskeletal:        General: Normal range of motion.     Cervical back: Normal range of motion and neck supple.  Skin:    General: Skin is warm and dry.  Neurological:     General: No focal deficit present.     Mental Status: He is oriented to person, place, and time.  Psychiatric:        Mood and Affect: Mood normal.        Thought Content: Thought content normal.        Judgment: Judgment normal.     Results for orders placed or performed during the hospital encounter of 08/28/19  Lipase, blood  Result Value Ref Range   Lipase 28 11 - 51 U/L  Comprehensive metabolic panel  Result Value Ref Range   Sodium 141 135 - 145 mmol/L   Potassium 4.4 3.5 - 5.1 mmol/L   Chloride 108 98 - 111 mmol/L   CO2 19 (L) 22 - 32 mmol/L   Glucose, Bld 139 (H) 70 - 99 mg/dL   BUN 16 8 - 23 mg/dL   Creatinine, Ser 4.43 0.61 - 1.24 mg/dL   Calcium  9.6 8.9 - 10.3 mg/dL   Total Protein 8.0 6.5 - 8.1 g/dL   Albumin 5.0 3.5 - 5.0 g/dL   AST 29 15 - 41 U/L   ALT 27 0 - 44 U/L   Alkaline Phosphatase 89 38 - 126 U/L   Total Bilirubin 1.3 (H) 0.3 - 1.2 mg/dL   GFR calc non Af Amer >60 >60 mL/min   GFR calc Af Amer >60 >60 mL/min   Anion gap 14 5 - 15  CBC  Result Value Ref Range   WBC 10.5 4.0 - 10.5 K/uL   RBC 5.49 4.22 - 5.81 MIL/uL   Hemoglobin 16.1 13.0 - 17.0 g/dL   HCT 83.1 39 - 52 %   MCV 89.3 80.0 - 100.0 fL   MCH 29.3 26.0 - 34.0 pg   MCHC 32.9 30.0 - 36.0 g/dL   RDW 51.7 61.6 - 07.3 %   Platelets 198 150 - 400 K/uL   nRBC 0.0 0.0 - 0.2 %      Assessment & Plan:   Problem List Items Addressed This Visit    None    Visit Diagnoses    Vertigo    -  Primary   Mild improvement with medications, continue these and frequent epley maneuvers, rest, slow movements. Refer to Vestibular PT if not resolving in next few weeks       Follow up plan: Return if symptoms worsen or fail to improve.

## 2019-09-02 NOTE — Telephone Encounter (Signed)
Patient called and says he's been having dizziness/vertigo since last Saturday. He says it started Saturday afternoon and all night on Saturday he vomited x 4 hours straight. He says he went to [facility] ED on Sunday morning at 0600, the doctor diagnosed Vertigo. He as given Diazepam 12 tabs to take and he has a couple left; given Meclizine 30 tabs to take and he has some of those left. He says today he's still dizzy, not as bad as the weekend. He denies any symptoms other than being dizzy and needing to see someone to receive more medication. Appointment scheduled for today at 1120 with Roosvelt Maser, PA, care advice given, he verbalized understanding.  Reason for Disposition . [1] MODERATE dizziness (e.g., vertigo; feels very unsteady, interferes with normal activities) AND [2] has been evaluated by physician for this  Answer Assessment - Initial Assessment Questions 1. DESCRIPTION: "Describe your dizziness."     Lightheaded when stand up, looking across the room 2. VERTIGO: "Do you feel like either you or the room is spinning or tilting?"      No 3. LIGHTHEADED: "Do you feel lightheaded?" (e.g., somewhat faint, woozy, weak upon standing)     Yes 4. SEVERITY: "How bad is it?"  "Can you walk?"   - MILD: Feels unsteady but walking normally.   - MODERATE: Feels very unsteady when walking, but not falling; interferes with normal activities (e.g., school, work) .   - SEVERE: Unable to walk without falling, or requires assistance to walk without falling.     Mild right now 5. ONSET:  "When did the dizziness begin?"     Saturday afternoon 6. AGGRAVATING FACTORS: "Does anything make it worse?" (e.g., standing, change in head position)     Standing, change head position 7. CAUSE: "What do you think is causing the dizziness?"     I don't know 8. RECURRENT SYMPTOM: "Have you had dizziness before?" If Yes, ask: "When was the last time?" "What happened that time?"     No 9. OTHER SYMPTOMS: "Do you have any  other symptoms?" (e.g., headache, weakness, numbness, vomiting, earache)     No 10. PREGNANCY: "Is there any chance you are pregnant?" "When was your last menstrual period?"      N/A  Protocols used: DIZZINESS - VERTIGO-A-AH

## 2019-09-15 ENCOUNTER — Other Ambulatory Visit: Payer: Self-pay | Admitting: Family Medicine

## 2019-09-15 DIAGNOSIS — E7800 Pure hypercholesterolemia, unspecified: Secondary | ICD-10-CM

## 2019-09-26 ENCOUNTER — Other Ambulatory Visit: Payer: Self-pay | Admitting: Nurse Practitioner

## 2019-09-26 DIAGNOSIS — M1000 Idiopathic gout, unspecified site: Secondary | ICD-10-CM

## 2019-09-28 ENCOUNTER — Encounter: Payer: Medicare Other | Admitting: Family Medicine

## 2019-09-28 ENCOUNTER — Encounter: Payer: Self-pay | Admitting: Nurse Practitioner

## 2019-09-28 ENCOUNTER — Ambulatory Visit (INDEPENDENT_AMBULATORY_CARE_PROVIDER_SITE_OTHER): Payer: Medicare Other | Admitting: Nurse Practitioner

## 2019-09-28 ENCOUNTER — Ambulatory Visit: Payer: Medicare Other

## 2019-09-28 ENCOUNTER — Other Ambulatory Visit: Payer: Self-pay

## 2019-09-28 VITALS — BP 128/78 | HR 50 | Temp 97.8°F | Ht 69.0 in | Wt 210.0 lb

## 2019-09-28 DIAGNOSIS — I480 Paroxysmal atrial fibrillation: Secondary | ICD-10-CM | POA: Diagnosis not present

## 2019-09-28 DIAGNOSIS — E559 Vitamin D deficiency, unspecified: Secondary | ICD-10-CM | POA: Diagnosis not present

## 2019-09-28 DIAGNOSIS — Z0000 Encounter for general adult medical examination without abnormal findings: Secondary | ICD-10-CM | POA: Diagnosis not present

## 2019-09-28 DIAGNOSIS — E782 Mixed hyperlipidemia: Secondary | ICD-10-CM

## 2019-09-28 DIAGNOSIS — I10 Essential (primary) hypertension: Secondary | ICD-10-CM

## 2019-09-28 DIAGNOSIS — N400 Enlarged prostate without lower urinary tract symptoms: Secondary | ICD-10-CM

## 2019-09-28 DIAGNOSIS — K219 Gastro-esophageal reflux disease without esophagitis: Secondary | ICD-10-CM

## 2019-09-28 DIAGNOSIS — M1000 Idiopathic gout, unspecified site: Secondary | ICD-10-CM

## 2019-09-28 DIAGNOSIS — I2510 Atherosclerotic heart disease of native coronary artery without angina pectoris: Secondary | ICD-10-CM

## 2019-09-28 DIAGNOSIS — Z951 Presence of aortocoronary bypass graft: Secondary | ICD-10-CM

## 2019-09-28 NOTE — Assessment & Plan Note (Signed)
Chronic, ongoing.  Continue current medication regimen and adjust as needed. Lipid panel today. 

## 2019-09-28 NOTE — Progress Notes (Signed)
BP 128/78   Pulse (!) 50 Comment: apical  Temp 97.8 F (36.6 C) (Oral)   Ht 5\' 9"  (1.753 m)   Wt 210 lb (95.3 kg)   SpO2 97%   BMI 31.01 kg/m    Subjective:    Patient ID: Thomas Miller, male    DOB: 1950-10-05, 69 y.o.   MRN: [id number]  HPI: Thomas Miller is a 69 y.o. male presenting on 09/28/2019 for comprehensive medical examination. Current medical complaints include:none  He currently lives with: significant other Interim Problems from his last visit: no   HYPERTENSION / HYPERLIPIDEMIA Continues on ASA, Losartan 25 MG daily, Metoprolol 25 MG BID, and Atorvastatin 80 MG daily.  Has NTG PRN, no use in months.  Recent CMP on 08/28/19 noted CRT 1.03 and GFR >60.    Last saw cardiology, Dr. 10/28/19, on 04/01/19.  Diagnosis of PAF noted + CAD.  Has history of CABG x 4.  Returns to cardiology next Wednesday.  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  Reports continued issues with dizziness if stands up too fast and turns.  No falls or injuries.  Walked 8000 steps yesterday.   Satisfied with current treatment? yes Duration of hypertension: chronic BP monitoring frequency: daily BP range: 128-135/70 range at home -- HR 48 to 50 range BP medication side effects: no Duration of hyperlipidemia: chronic Cholesterol medication side effects: no Cholesterol supplements: none Medication compliance: good compliance Aspirin: yes Recent stressors: no Recurrent headaches: no Visual changes: occasional -- is seeing eye doctor Palpitations: none Dyspnea: no Chest pain: no Lower extremity edema: no Dizzy/lightheaded: yes   GERD Continues on Nexium daily. GERD control status: controlled  Satisfied with current treatment? yes Heartburn frequency: none Medication side effects: no  Medication compliance: stable Previous GERD  medications: none Antacid use frequency:  none Dysphagia: no Odynophagia:  no Hematemesis: no Blood in stool: no EGD: years ago   GOUT Continues on daily Allopurinol.  Has not had a flare in quite awhile.  March uric acid level 3.2.   Duration:chronic Swelling: no Redness: no Trauma: no Recent dietary change or indiscretion: no Fevers: no Nausea/vomiting: no Status:  stable  Functional Status Survey: Is the patient deaf or have difficulty hearing?: No Does the patient have difficulty seeing, even when wearing glasses/contacts?: No Does the patient have difficulty concentrating, remembering, or making decisions?: No Does the patient have difficulty walking or climbing stairs?: No Does the patient have difficulty dressing or bathing?: No Does the patient have difficulty doing errands alone such as visiting a doctor's office or shopping?: No  FALL RISK: Fall Risk  11/01/2018 07/21/2018 10/28/2017 08/17/2017 06/03/2017  Falls in the past year? 1 0 No No No  Number falls in past yr: 0 0 - - -  Injury with Fall? 0 0 - - -  Comment just some hip discomfort - - - -    Depression Screen Depression screen [facility] 2/9 09/28/2019 04/12/2019 11/01/2018 07/21/2018 10/28/2017  Decreased Interest 0 0 0 0 0  Down, Depressed, Hopeless 0 0 0 0 0  PHQ - 2 Score 0 0 0 0 0  Altered sleeping 0 - - - -  Tired, decreased energy 0 - - - -  Change in appetite 0 - - - -  Feeling bad or failure about yourself  0 - - - -  Trouble concentrating 0 - - - -  Moving slowly or fidgety/restless 0 - - - -  Suicidal thoughts 0 - - - -  PHQ-9 Score 0 - - - -  Difficult doing work/chores Not difficult at all - - - -    Advanced Directives <no information>  Past Medical History:  Past Medical History:  Diagnosis Date  . Gout   . Hearing loss   . Hypertension   . Kidney stones   . Reflux   . S/P epidural steroid injection 05/2018    Surgical History:  Past Surgical History:  Procedure Laterality Date  .  BACK SURGERY    . CORONARY ARTERY BYPASS GRAFT N/A 07/20/2017   Procedure: CORONARY ARTERY BYPASS GRAFTING (CABG) times four using left internal mammary artery to LAD and endoscopically harvested right saphenous vein graft to distal circumflex, intermediate, and PD;  Surgeon: [name], MD;  Location: [facility] OR;  Service: Open Heart Surgery;  Laterality: N/A;  . EPIDURAL BLOCK INJECTION  05/2018   L3injections   . EYE SURGERY Bilateral 2013  . FOOT SURGERY Bilateral    ingrown toenails  . GALLBLADDER SURGERY    . LEFT HEART CATH AND CORONARY ANGIOGRAPHY N/A 07/17/2017   Procedure: LEFT HEART CATH AND CORONARY ANGIOGRAPHY;  Surgeon: Yvonne Kendall, MD;  Location: [facility] INVASIVE CV LAB;  Service: Cardiovascular;  Laterality: N/A;  . SHOULDER SURGERY Right   . SPINE SURGERY      Medications:  Current Outpatient Medications on File Prior to Visit  Medication Sig  . allopurinol (ZYLOPRIM) 300 MG tablet Take 1 tablet (300 mg total) by mouth daily.  [name] Drakes Cider Vinegar 600 MG CAPS Take 600 mg by mouth daily.  [name] aspirin EC 81 MG tablet Take 2 tablets (162 mg total) by mouth daily.  [name] atorvastatin (LIPITOR) 80 MG tablet Take 1 tablet (80 mg total) by mouth daily at 6 PM.  . esomeprazole (NEXIUM) 40 MG capsule Take 1 capsule (40 mg total) by mouth daily before breakfast.  . Liniments (DEEP BLUE RELIEF) GEL Apply topically daily.  [name] losartan (COZAAR) 25 MG tablet Take 1 tablet (25 mg total) by mouth daily.  . meclizine (ANTIVERT) 12.5 MG tablet Take 1 tablet (12.5 mg total) by mouth 3 (three) times daily as needed for dizziness.  . metoprolol tartrate (LOPRESSOR) 25 MG tablet Take 1 tablet (25 mg total) by mouth 2 (two) times daily.  [name] VITAMIN D, CHOLECALCIFEROL, PO Take 2,000 mg by mouth daily.  . nitroGLYCERIN (NITROSTAT) 0.4 MG SL tablet Place 1 tablet (0.4 mg total) under the tongue every 5 (five) minutes as needed for chest pain.   No current facility-administered medications on file  prior to visit.    Allergies:  Allergies  Allergen Reactions  . Ace Inhibitors Cough    "cough"  . Codeine Other (See Comments)    Insomnia  . Naprosyn [Naproxen] Rash    Social History:  Social History   Socioeconomic History  . Marital status: Married    Spouse name: Not on file  . Number of children: Not on file  . Years of education: Not on file  . Highest education level: Associate degree: occupational, Scientist, product/process development, or vocational program  Occupational History  . Occupation: retired  Tobacco Use  . Smoking status: Never Smoker  . Smokeless tobacco: Never Used  Vaping Use  . Vaping Use: Never used  Substance and Sexual Activity  . Alcohol use: Not Currently  . Drug use: No  .  Sexual activity: Not on file  Other Topics Concern  . Not on file  Social History Narrative   Volunteers and golfs    Social Determinants of Health   Financial Resource Strain: Low Risk   . Difficulty of Paying Living Expenses: Not hard at all  Food Insecurity: No Food Insecurity  . Worried About Programme researcher, broadcasting/film/video in the Last Year: Never true  . Ran Out of Food in the Last Year: Never true  Transportation Needs: No Transportation Needs  . Lack of Transportation (Medical): No  . Lack of Transportation (Non-Medical): No  Physical Activity: Sufficiently Active  . Days of Exercise per Week: 3 days  . Minutes of Exercise per Session: 90 min  Stress: No Stress Concern Present  . Feeling of Stress : Not at all  Social Connections: Socially Integrated  . Frequency of Communication with Friends and Family: More than three times a week  . Frequency of Social Gatherings with Friends and Family: More than three times a week  . Attends Religious Services: More than 4 times per year  . Active Member of Clubs or Organizations: Yes  . Attends Banker Meetings: More than 4 times per year  . Marital Status: Married  Catering manager Violence: Not At Risk  . Fear of Current or  Ex-Partner: No  . Emotionally Abused: No  . Physically Abused: No  . Sexually Abused: No   Social History   Tobacco Use  Smoking Status Never Smoker  Smokeless Tobacco Never Used   Social History   Substance and Sexual Activity  Alcohol Use Not Currently    Family History:  Family History  Problem Relation Age of Onset  . Diabetes Mother   . Lung cancer Father   . Stroke Paternal Grandfather     Past medical history, surgical history, medications, allergies, family history and social history reviewed with patient today and changes made to appropriate areas of the chart.   Review of Systems - negative All other ROS negative except what is listed above and in the HPI.      Objective:    BP 128/78   Pulse (!) 50 Comment: apical  Temp 97.8 F (36.6 C) (Oral)   Ht  (1.753 m)   Wt 210 lb (95.3 kg)   SpO2 97%   BMI 31.01 kg/m   Wt Readings from Last 3 Encounters:  09/28/19 210 lb (95.3 kg)  09/02/19 211 lb (95.7 kg)  08/28/19 (!) 205 lb (93 kg)    Physical Exam Vitals and nursing note reviewed.  Constitutional:      General: He is awake. He is not in acute distress.    Appearance: He is well-developed and well-groomed. He is obese. He is not ill-appearing.  HENT:     Head: Normocephalic and atraumatic.     Right Ear: Hearing, tympanic membrane, ear canal and external ear normal. No drainage.     Left Ear: Hearing, tympanic membrane, ear canal and external ear normal. No drainage.     Nose: Nose normal.     Mouth/Throat:     Pharynx: Uvula midline.  Eyes:     General: Lids are normal.        Right eye: No discharge.        Left eye: No discharge.     Extraocular Movements: Extraocular movements intact.     Conjunctiva/sclera: Conjunctivae normal.     Pupils: Pupils are equal, round, and reactive to light.  Visual Fields: Right eye visual fields normal and left eye visual fields normal.  Neck:     Thyroid: No thyromegaly.     Vascular: No carotid  bruit or JVD.     Trachea: Trachea normal.  Cardiovascular:     Rate and Rhythm: Normal rate and regular rhythm.     Heart sounds: Normal heart sounds, S1 normal and S2 normal. No murmur heard.  No gallop.   Pulmonary:     Effort: Pulmonary effort is normal. No accessory muscle usage or respiratory distress.     Breath sounds: Normal breath sounds.  Abdominal:     General: Bowel sounds are normal.     Palpations: Abdomen is soft. There is no hepatomegaly or splenomegaly.     Tenderness: There is no abdominal tenderness.  Musculoskeletal:        General: Normal range of motion.     Cervical back: Normal range of motion and neck supple.     Right lower leg: No edema.     Left lower leg: No edema.  Lymphadenopathy:     Head:     Right side of head: No submental, submandibular, tonsillar, preauricular or posterior auricular adenopathy.     Left side of head: No submental, submandibular, tonsillar, preauricular or posterior auricular adenopathy.     Cervical: No cervical adenopathy.  Skin:    General: Skin is warm and dry.     Capillary Refill: Capillary refill takes less than 2 seconds.     Findings: No rash.  Neurological:     Mental Status: He is alert and oriented to person, place, and time.     Cranial Nerves: Cranial nerves are intact.     Gait: Gait is intact.     Deep Tendon Reflexes: Reflexes are normal and symmetric.     Reflex Scores:      Brachioradialis reflexes are 2+ on the right side and 2+ on the left side.      Patellar reflexes are 2+ on the right side and 2+ on the left side. Psychiatric:        Attention and Perception: Attention normal.        Mood and Affect: Mood normal.        Speech: Speech normal.        Behavior: Behavior normal. Behavior is cooperative.        Thought Content: Thought content normal.        Cognition and Memory: Cognition normal.        Judgment: Judgment normal.    Results for orders placed or performed during the hospital  encounter of 08/28/19  Lipase, blood  Result Value Ref Range   Lipase 28 11 - 51 U/L  Comprehensive metabolic panel  Result Value Ref Range   Sodium 141 135 - 145 mmol/L   Potassium 4.4 3.5 - 5.1 mmol/L   Chloride 108 98 - 111 mmol/L   CO2 19 (L) 22 - 32 mmol/L   Glucose, Bld 139 (H) 70 - 99 mg/dL   BUN 16 8 - 23 mg/dL   Creatinine, Ser 1.301.03 0.61 - 1.24 mg/dL   Calcium 9.6 8.9 - 86.510.3 mg/dL   Total Protein 8.0 6.5 - 8.1 g/dL   Albumin 5.0 3.5 - 5.0 g/dL   AST 29 15 - 41 U/L   ALT 27 0 - 44 U/L   Alkaline Phosphatase 89 38 - 126 U/L   Total Bilirubin 1.3 (H) 0.3 - 1.2 mg/dL   GFR  calc non Af Amer >60 >60 mL/min   GFR calc Af Amer >60 >60 mL/min   Anion gap 14 5 - 15  CBC  Result Value Ref Range   WBC 10.5 4.0 - 10.5 K/uL   RBC 5.49 4.22 - 5.81 MIL/uL   Hemoglobin 16.1 13.0 - 17.0 g/dL   HCT 18.8 39 - 52 %   MCV 89.3 80.0 - 100.0 fL   MCH 29.3 26.0 - 34.0 pg   MCHC 32.9 30.0 - 36.0 g/dL   RDW 41.6 60.6 - 30.1 %   Platelets 198 150 - 400 K/uL   nRBC 0.0 0.0 - 0.2 %      Assessment & Plan:   Problem List Items Addressed This Visit      Cardiovascular and Mediastinum   Hypertension    Chronic, ongoing with BP below goal for age today.  Recommend he continue to monitor BP at home daily + document and bring to provider visits.  Continue current medication regimen at this time, sent secure chat to Dr. Tomie China and discussed with patient may need to reduce Metoprolol to once daily due to lower baseline HR and orthostatic dizziness.  Will collaborate with cardiology on this and see their recommendations.  Patient is scheduled for follow-up with them next week.  Discussed with him if HR<60 at home would attempt to hold Metoprolol to see if improvement in symptoms.  Check BMP today.  Focus on DASH diet at home.  Return in 6 months.  Refills sent in.      Relevant Orders   Basic metabolic panel   TSH   PAF (paroxysmal atrial fibrillation) (HCC)    Chronic, followed by cardiology.   On BB, but no anticoagulant at this time.  Continue current medication regimen as recommended by them.        CAD (coronary artery disease)    Chronic, stable.  No recent NTG use.  Continue collaboration with cardiology.        Digestive   GERD (gastroesophageal reflux disease)    Chronic, stable on daily Nexium.  Continue current regimen and adjust as needed.  Mag level today.      Relevant Orders   Magnesium     Genitourinary   BPH (benign prostatic hyperplasia)    Asymptomatic with no current medications.  Will check PSA level today, discussed with patient.      Relevant Orders   PSA     Other   Gout    Chronic, stable on Allopurinol with recent kidney function and uric acid level stable.  Continue current regimen and adjust as needed.  No recent flares.      Hyperlipemia    Chronic, ongoing.  Continue current medication regimen and adjust as needed.  Lipid panel today.      Relevant Orders   Lipid Panel w/o Chol/HDL Ratio   S/P CABG x 4    Followed by cardiology, continue this collaboration.       Other Visit Diagnoses    Routine general medical examination at a health care facility    -  Primary   Vitamin D deficiency       Takes daily supplement, check Vit D level today.   Relevant Orders   VITAMIN D 25 Hydroxy (Vit-D Deficiency, Fractures)      Discussed aspirin prophylaxis for myocardial infarction prevention and decision was made to continue ASA  LABORATORY TESTING:  Health maintenance labs ordered today as discussed above.   The natural  history of prostate cancer and ongoing controversy regarding screening and potential treatment outcomes of prostate cancer has been discussed with the patient. The meaning of a false positive PSA and a false negative PSA has been discussed. He indicates understanding of the limitations of this screening test and wishes to proceed with screening PSA testing.   IMMUNIZATIONS:   - Tdap: Tetanus vaccination status  reviewed: refused today, recently had Covid vaccine #1 - Influenza: refused today, recently had Covid vaccine #1 - Pneumovax: Up to date - Prevnar: refused today, recently had Covid vaccine #1 - Zostavax vaccine: Up to date  SCREENING: - Colonoscopy: Up to date -- Cologuard Discussed with patient purpose of the colonoscopy is to detect colon cancer at curable precancerous or early stages   - AAA Screening: Not applicable  -Hearing Test: Not applicable  -Spirometry: Not applicable   PATIENT COUNSELING:    Sexuality: Discussed sexually transmitted diseases, partner selection, use of condoms, avoidance of unintended pregnancy  and contraceptive alternatives.   Advised to avoid cigarette smoking.  I discussed with the patient that most people either abstain from alcohol or drink within safe limits (<=14/week and <=4 drinks/occasion for males, <=7/weeks and <= 3 drinks/occasion for females) and that the risk for alcohol disorders and other health effects rises proportionally with the number of drinks per week and how often a drinker exceeds daily limits.  Discussed cessation/primary prevention of drug use and availability of treatment for abuse.   Diet: Encouraged to adjust caloric intake to maintain  or achieve ideal body weight, to reduce intake of dietary saturated fat and total fat, to limit sodium intake by avoiding high sodium foods and not adding table salt, and to maintain adequate dietary potassium and calcium preferably from fresh fruits, vegetables, and low-fat dairy products.    stressed the importance of regular exercise  Injury prevention: Discussed safety belts, safety helmets, smoke detector, smoking near bedding or upholstery.   Dental health: Discussed importance of regular tooth brushing, flossing, and dental visits.   Follow up plan: NEXT PREVENTATIVE PHYSICAL DUE IN 1 YEAR. Return in about 6 months (around 03/27/2020) for HTN/HLD, GERD.

## 2019-09-28 NOTE — Assessment & Plan Note (Signed)
Followed by cardiology, continue this collaboration. ?

## 2019-09-28 NOTE — Progress Notes (Signed)
Spoke to Dr. Tomie China via secure chat.  Will 1/2 dose Metoprolol to 25 MG daily -- alerted patient via telephone.  He has follow-up next week with cardiology, will further assess then if decreased dizziness with this.

## 2019-09-28 NOTE — Patient Instructions (Signed)
DASH Eating Plan DASH stands for "Dietary Approaches to Stop Hypertension." The DASH eating plan is a healthy eating plan that has been shown to reduce high blood pressure (hypertension). It may also reduce your risk for type 2 diabetes, heart disease, and stroke. The DASH eating plan may also help with weight loss. What are tips for following this plan?  General guidelines  Avoid eating more than 2,300 mg (milligrams) of salt (sodium) a day. If you have hypertension, you may need to reduce your sodium intake to 1,500 mg a day.  Limit alcohol intake to no more than 1 drink a day for nonpregnant women and 2 drinks a day for men. One drink equals 12 oz of beer, 5 oz of wine, or 1 oz of hard liquor.  Work with your health care provider to maintain a healthy body weight or to lose weight. Ask what an ideal weight is for you.  Get at least 30 minutes of exercise that causes your heart to beat faster (aerobic exercise) most days of the week. Activities may include walking, swimming, or biking.  Work with your health care provider or diet and nutrition specialist (dietitian) to adjust your eating plan to your individual calorie needs. Reading food labels   Check food labels for the amount of sodium per serving. Choose foods with less than 5 percent of the Daily Value of sodium. Generally, foods with less than 300 mg of sodium per serving fit into this eating plan.  To find whole grains, look for the word "whole" as the first word in the ingredient list. Shopping  Buy products labeled as "low-sodium" or "no salt added."  Buy fresh foods. Avoid canned foods and premade or frozen meals. Cooking  Avoid adding salt when cooking. Use salt-free seasonings or herbs instead of table salt or sea salt. Check with your health care provider or pharmacist before using salt substitutes.  Do not fry foods. Cook foods using healthy methods such as baking, boiling, grilling, and broiling instead.  Cook with  heart-healthy oils, such as olive, canola, soybean, or sunflower oil. Meal planning  Eat a balanced diet that includes: ? 5 or more servings of fruits and vegetables each day. At each meal, try to fill half of your plate with fruits and vegetables. ? Up to 6-8 servings of whole grains each day. ? Less than 6 oz of lean meat, poultry, or fish each day. A 3-oz serving of meat is about the same size as a deck of cards. One egg equals 1 oz. ? 2 servings of low-fat dairy each day. ? A serving of nuts, seeds, or beans 5 times each week. ? Heart-healthy fats. Healthy fats called Omega-3 fatty acids are found in foods such as flaxseeds and coldwater fish, like sardines, salmon, and mackerel.  Limit how much you eat of the following: ? Canned or prepackaged foods. ? Food that is high in trans fat, such as fried foods. ? Food that is high in saturated fat, such as fatty meat. ? Sweets, desserts, sugary drinks, and other foods with added sugar. ? Full-fat dairy products.  Do not salt foods before eating.  Try to eat at least 2 vegetarian meals each week.  Eat more home-cooked food and less restaurant, buffet, and fast food.  When eating at a restaurant, ask that your food be prepared with less salt or no salt, if possible. What foods are recommended? The items listed may not be a complete list. Talk with your dietitian about   what dietary choices are best for you. Grains Whole-grain or whole-wheat bread. Whole-grain or whole-wheat pasta. Brown rice. Oatmeal. Quinoa. Bulgur. Whole-grain and low-sodium cereals. Pita bread. Low-fat, low-sodium crackers. Whole-wheat flour tortillas. Vegetables Fresh or frozen vegetables (raw, steamed, roasted, or grilled). Low-sodium or reduced-sodium tomato and vegetable juice. Low-sodium or reduced-sodium tomato sauce and tomato paste. Low-sodium or reduced-sodium canned vegetables. Fruits All fresh, dried, or frozen fruit. Canned fruit in natural juice (without  added sugar). Meat and other protein foods Skinless chicken or turkey. Ground chicken or turkey. Pork with fat trimmed off. Fish and seafood. Egg whites. Dried beans, peas, or lentils. Unsalted nuts, nut butters, and seeds. Unsalted canned beans. Lean cuts of beef with fat trimmed off. Low-sodium, lean deli meat. Dairy Low-fat (1%) or fat-free (skim) milk. Fat-free, low-fat, or reduced-fat cheeses. Nonfat, low-sodium ricotta or cottage cheese. Low-fat or nonfat yogurt. Low-fat, low-sodium cheese. Fats and oils Soft margarine without trans fats. Vegetable oil. Low-fat, reduced-fat, or light mayonnaise and salad dressings (reduced-sodium). Canola, safflower, olive, soybean, and sunflower oils. Avocado. Seasoning and other foods Herbs. Spices. Seasoning mixes without salt. Unsalted popcorn and pretzels. Fat-free sweets. What foods are not recommended? The items listed may not be a complete list. Talk with your dietitian about what dietary choices are best for you. Grains Baked goods made with fat, such as croissants, muffins, or some breads. Dry pasta or rice meal packs. Vegetables Creamed or fried vegetables. Vegetables in a cheese sauce. Regular canned vegetables (not low-sodium or reduced-sodium). Regular canned tomato sauce and paste (not low-sodium or reduced-sodium). Regular tomato and vegetable juice (not low-sodium or reduced-sodium). Pickles. Olives. Fruits Canned fruit in a light or heavy syrup. Fried fruit. Fruit in cream or butter sauce. Meat and other protein foods Fatty cuts of meat. Ribs. Fried meat. Bacon. Sausage. Bologna and other processed lunch meats. Salami. Fatback. Hotdogs. Bratwurst. Salted nuts and seeds. Canned beans with added salt. Canned or smoked fish. Whole eggs or egg yolks. Chicken or turkey with skin. Dairy Whole or 2% milk, cream, and half-and-half. Whole or full-fat cream cheese. Whole-fat or sweetened yogurt. Full-fat cheese. Nondairy creamers. Whipped toppings.  Processed cheese and cheese spreads. Fats and oils Butter. Stick margarine. Lard. Shortening. Ghee. Bacon fat. Tropical oils, such as coconut, palm kernel, or palm oil. Seasoning and other foods Salted popcorn and pretzels. Onion salt, garlic salt, seasoned salt, table salt, and sea salt. Worcestershire sauce. Tartar sauce. Barbecue sauce. Teriyaki sauce. Soy sauce, including reduced-sodium. Steak sauce. Canned and packaged gravies. Fish sauce. Oyster sauce. Cocktail sauce. Horseradish that you find on the shelf. Ketchup. Mustard. Meat flavorings and tenderizers. Bouillon cubes. Hot sauce and Tabasco sauce. Premade or packaged marinades. Premade or packaged taco seasonings. Relishes. Regular salad dressings. Where to find more information:  National Heart, Lung, and Blood Institute: www.nhlbi.nih.gov  American Heart Association: www.heart.org Summary  The DASH eating plan is a healthy eating plan that has been shown to reduce high blood pressure (hypertension). It may also reduce your risk for type 2 diabetes, heart disease, and stroke.  With the DASH eating plan, you should limit salt (sodium) intake to 2,300 mg a day. If you have hypertension, you may need to reduce your sodium intake to 1,500 mg a day.  When on the DASH eating plan, aim to eat more fresh fruits and vegetables, whole grains, lean proteins, low-fat dairy, and heart-healthy fats.  Work with your health care provider or diet and nutrition specialist (dietitian) to adjust your eating plan to your   individual calorie needs. This information is not intended to replace advice given to you by your health care provider. Make sure you discuss any questions you have with your health care provider. Document Revised: 12/26/2016 Document Reviewed: 01/07/2016 Elsevier Patient Education  2020 [employer] Inc.  

## 2019-09-28 NOTE — Assessment & Plan Note (Addendum)
Chronic, ongoing with BP below goal for age today.  Recommend he continue to monitor BP at home daily + document and bring to provider visits.  Continue current medication regimen at this time, sent secure chat to Dr. Tomie China and discussed with patient may need to reduce Metoprolol to once daily due to lower baseline HR and orthostatic dizziness.  Will collaborate with cardiology on this and see their recommendations.  Patient is scheduled for follow-up with them next week.  Discussed with him if HR<60 at home would attempt to hold Metoprolol to see if improvement in symptoms.  Check BMP today.  Focus on DASH diet at home.  Return in 6 months.  Refills sent in.

## 2019-09-28 NOTE — Assessment & Plan Note (Signed)
Chronic, stable on daily Nexium.  Continue current regimen and adjust as needed.  Mag level today.  

## 2019-09-28 NOTE — Assessment & Plan Note (Signed)
Chronic, stable.  No recent NTG use.  Continue collaboration with cardiology. °

## 2019-09-28 NOTE — Assessment & Plan Note (Signed)
Asymptomatic with no current medications.  Will check PSA level today, discussed with patient.

## 2019-09-28 NOTE — Assessment & Plan Note (Signed)
Chronic, followed by cardiology.  On BB, but no anticoagulant at this time.  Continue current medication regimen as recommended by them.   

## 2019-09-28 NOTE — Assessment & Plan Note (Signed)
Chronic, stable on Allopurinol with recent kidney function and uric acid level stable.  Continue current regimen and adjust as needed.  No recent flares. 

## 2019-09-29 LAB — LIPID PANEL W/O CHOL/HDL RATIO
Cholesterol, Total: 130 mg/dL (ref 100–199)
HDL: 40 mg/dL (ref >39)
LDL Chol Calc (NIH): 65 mg/dL (ref 0–99)
Triglycerides: 142 mg/dL (ref 0–149)
VLDL Cholesterol Cal: 25 mg/dL (ref 5–40)

## 2019-09-29 LAB — BASIC METABOLIC PANEL
BUN/Creatinine Ratio: 16 (ref 10–24)
BUN: 17 mg/dL (ref 8–27)
CO2: 24 mmol/L (ref 20–29)
Calcium: 9.3 mg/dL (ref 8.6–10.2)
Chloride: 102 mmol/L (ref 96–106)
Creatinine, Ser: 1.04 mg/dL (ref 0.76–1.27)
GFR calc Af Amer: 84 mL/min/{1.73_m2} (ref >59)
GFR calc non Af Amer: 72 mL/min/{1.73_m2} (ref >59)
Glucose: 92 mg/dL (ref 65–99)
Potassium: 4.5 mmol/L (ref 3.5–5.2)
Sodium: 139 mmol/L (ref 134–144)

## 2019-09-29 LAB — VITAMIN D 25 HYDROXY (VIT D DEFICIENCY, FRACTURES): Vit D, 25-Hydroxy: 65.5 ng/mL (ref 30.0–100.0)

## 2019-09-29 LAB — TSH: TSH: 1.96 u[IU]/mL (ref 0.450–4.500)

## 2019-09-29 LAB — MAGNESIUM: Magnesium: 2.1 mg/dL (ref 1.6–2.3)

## 2019-09-29 LAB — PSA: Prostate Specific Ag, Serum: 2.5 ng/mL (ref 0.0–4.0)

## 2019-09-29 NOTE — Progress Notes (Signed)
Contacted via MyChart  Good morning Thomas Miller, look at those labs.  These look fantastic.  No concerns on them.  I would continue current medication regimen.  You are doing wonderful!!  Any questions? Keep being awesome!!  Thank you for allowing me to participate in your care. Kindest regards, Giamarie Bueche

## 2019-10-05 ENCOUNTER — Ambulatory Visit: Payer: Medicare Other | Admitting: Cardiology

## 2019-10-05 ENCOUNTER — Other Ambulatory Visit: Payer: Self-pay

## 2019-10-05 ENCOUNTER — Encounter: Payer: Self-pay | Admitting: Cardiology

## 2019-10-05 ENCOUNTER — Telehealth: Payer: Self-pay | Admitting: Radiology

## 2019-10-05 VITALS — BP 150/92 | HR 52 | Ht 69.0 in | Wt 209.0 lb

## 2019-10-05 DIAGNOSIS — Z8673 Personal history of transient ischemic attack (TIA), and cerebral infarction without residual deficits: Secondary | ICD-10-CM

## 2019-10-05 DIAGNOSIS — R002 Palpitations: Secondary | ICD-10-CM | POA: Diagnosis not present

## 2019-10-05 DIAGNOSIS — I10 Essential (primary) hypertension: Secondary | ICD-10-CM | POA: Diagnosis not present

## 2019-10-05 DIAGNOSIS — Z951 Presence of aortocoronary bypass graft: Secondary | ICD-10-CM

## 2019-10-05 DIAGNOSIS — I4729 Other ventricular tachycardia: Secondary | ICD-10-CM

## 2019-10-05 DIAGNOSIS — E782 Mixed hyperlipidemia: Secondary | ICD-10-CM

## 2019-10-05 DIAGNOSIS — I2510 Atherosclerotic heart disease of native coronary artery without angina pectoris: Secondary | ICD-10-CM

## 2019-10-05 DIAGNOSIS — I472 Ventricular tachycardia: Secondary | ICD-10-CM

## 2019-10-05 HISTORY — DX: Personal history of transient ischemic attack (TIA), and cerebral infarction without residual deficits: Z86.73

## 2019-10-05 NOTE — Telephone Encounter (Signed)
Enrolled patient for a 30 day [employer] Event Monitor to be mailed to patients home  

## 2019-10-05 NOTE — Patient Instructions (Signed)
Medication Instructions:   Your physician recommends that you continue on your current medications as directed. Please refer to the Current Medication list given to you today.  *If you need a refill on your cardiac medications before your next appointment, please call your pharmacy*   Lab Work: None If you have labs (blood work) drawn today and your tests are completely normal, you will receive your results only by: [name] MyChart Message (if you have MyChart) OR . A paper copy in the mail If you have any lab test that is abnormal or we need to change your treatment, we will call you to review the results.   Testing/Procedures: Your physician has recommended that you wear an event monitor. Event monitors are medical devices that record the heart's electrical activity. Doctors most often [place] these monitors to diagnose arrhythmias. Arrhythmias are problems with the speed or rhythm of the heartbeat. The monitor is a small, portable device. You can wear one while you do your normal daily activities. This is usually used to diagnose what is causing palpitations/syncope (passing out).     Follow-Up: At [facility], you and your health needs are our priority.  As part of our continuing mission to provide you with exceptional heart care, we have created designated Provider Care Teams.  These Care Teams include your primary Cardiologist (physician) and Advanced Practice Providers (APPs -  Physician Assistants and Nurse Practitioners) who all work together to provide you with the care you need, when you need it.  We recommend signing up for the patient portal called "MyChart".  Sign up information is provided on this After Visit Summary.  MyChart is used to connect with patients for Virtual Visits (Telemedicine).  Patients are able to view lab/test results, encounter notes, upcoming appointments, etc.  Non-urgent messages can be sent to your provider as well.   To learn more about what you can do with  MyChart, go to [email or web address].    Your next appointment:   6 month(s)  The format for your next appointment:   In Person  Provider:   Belva Crome, MD   Other Instructions [employer] Cardiac Event Monitor Instructions Your physician has requested you wear your cardiac event monitor for _____ days, (1-30). [employer] may call or text to confirm a shipping address. The monitor will be sent to a land address via UPS. [employer] will not ship a monitor to a PO BOX. It typically takes 3-5 days to receive your monitor after it has been enrolled. [employer] will assist with USPS tracking if your package is delayed. The telephone number for [employer] is [phone]. Once you have received your monitor, please review the enclosed instructions. Instruction tutorials can also be viewed under help and settings on the enclosed cell phone. Your monitor has already been registered assigning a specific monitor serial # to you.  Applying the monitor Remove cell phone from case and turn it on. The cell phone works as IT consultant and needs to be within [employer] of you at all times. The cell phone will need to be charged on a daily basis. We recommend you plug the cell phone into the enclosed charger at your bedside table every night.  Monitor batteries: You will receive two monitor batteries labelled #1 and #2. These are your recorders. Plug battery #2 onto the second connection on the enclosed charger. Keep one battery on the charger at all times. This will keep the monitor battery deactivated. It will also keep it fully charged for when  you need to switch your monitor batteries. A small light will be blinking on the battery emblem when it is charging. The light on the battery emblem will remain on when the battery is fully charged.  Open package of a Monitor strip. Insert battery #1 into black hood on strip and gently squeeze monitor battery onto connection as indicated in  instruction booklet. Set aside while preparing skin.  Choose location for your strip, vertical or horizontal, as indicated in the instruction booklet. Shave to remove all hair from location. There cannot be any lotions, oils, powders, or colognes on skin where monitor is to be applied. Wipe skin clean with enclosed Saline wipe. Dry skin completely.  Peel paper labeled #1 off the back of the Monitor strip exposing the adhesive. Place the monitor on the chest in the vertical or horizontal position shown in the instruction booklet. One arrow on the monitor strip must be pointing upward. Carefully remove paper labeled #2, attaching remainder of strip to your skin. Try not to create any folds or wrinkles in the strip as you apply it.  Firmly press and release the circle in the center of the monitor battery. You will hear a small beep. This is turning the monitor battery on. The heart emblem on the monitor battery will light up every 5 seconds if the monitor battery in turned on and connected to the patient securely. Do not push and hold the circle down as this turns the monitor battery off. The cell phone will locate the monitor battery. A screen will appear on the cell phone checking the connection of your monitor strip. This may read poor connection initially but change to good connection within the next minute. Once your monitor accepts the connection you will hear a series of 3 beeps followed by a climbing crescendo of beeps. A screen will appear on the cell phone showing the two monitor strip placement options. Touch the picture that demonstrates where you applied the monitor strip.  Your monitor strip and battery are waterproof. You are able to shower, bathe, or swim with the monitor on. They just ask you do not submerge deeper than 3 feet underwater. We recommend removing the monitor if you are swimming in a lake, river, or ocean.  Your monitor battery will need to be switched to a fully  charged monitor battery approximately once a week. The cell phone will alert you of an action which needs to be made.  On the cell phone, tap for details to reveal connection status, monitor battery status, and cell phone battery status. The green dots indicates your monitor is in good status. A red dot indicates there is something that needs your attention.  To record a symptom, click the circle on the monitor battery. In 30-60 seconds a list of symptoms will appear on the cell phone. Select your symptom and tap save. Your monitor will record a sustained or significant arrhythmia regardless of you clicking the button. Some patients do not feel the heart rhythm irregularities. [employer] will notify us of any serious or critical events.  Refer to instruction booklet for instructions on switching batteries, changing strips, the Do not disturb or Pause features, or any additional questions.  Call [employer] at [phone], to confirm your monitor is transmitting and record your baseline. They will answer any questions you may have regarding the monitor instructions at that time.  Returning the monitor to [employer] Place all equipment back into blue box. Peel off strip of paper to expose  adhesive and close box securely. There is a prepaid UPS shipping label on this box. Drop in a UPS drop box, or at a UPS facility like [employer]. You may also contact [employer] to arrange UPS to pick up monitor package at your home.

## 2019-10-05 NOTE — Progress Notes (Signed)
Cardiology Office Note:    Date:  10/05/2019   ID:  Thomas Miller, DOB 1950-11-22, MRN [id number]  PCP:  Steele Sizer, MD  Cardiologist:  Garwin Brothers, MD   Referring MD: Steele Sizer, MD    ASSESSMENT:    1. Essential hypertension   2. Mixed hyperlipidemia   3. Coronary artery disease involving native coronary artery of native heart without angina pectoris   4. S/P CABG x 4    PLAN:    In order of problems listed above:  1. Coronary artery disease: Secondary prevention stressed with the patient. Importance of compliance with diet medication stressed any vocalized understanding. He has good effort tolerance and walks on a regular basis. 2. Essential hypertension: Blood pressure stable. He has an element of whitecoat hypertension and mentions to me that his blood pressures are fine at home. 3. Mixed dyslipidemia: Lab work from [employer] sheet lipids lipids to be unremarkable and I discussed this with him. 4. History of stroke: He has history of paroxysmal atrial fibrillation only in the perioperative.. In view of the findings on the CT scan we will do a 1 month monitoring to assess for any arrhythmia. I discussed this with him at length and he vocalized understanding. 5. Patient will be seen in follow-up appointment in 6 months or earlier if the patient has any concerns    Medication Adjustments/Labs and Tests Ordered: Current medicines are reviewed at length with the patient today.  Concerns regarding medicines are outlined above.  No orders of the defined types were placed in this encounter.  No orders of the defined types were placed in this encounter.    No chief complaint on file.    History of Present Illness:    Thomas Miller is a 69 y.o. male. Patient has past medical history of coronary artery disease post bypass surgery, essential hypertension and dyslipidemia. He has had spinal surgery recently. He denies any chest pain orthopnea or PND. He recently had a CT  of the head which revealed multiple infarcts. He denies any chest pain orthopnea or PND. He occasionally has history of palpitations. At the time of my evaluation, the patient is alert awake oriented and in no distress. He is active and walks on a regular basis.  Past Medical History:  Diagnosis Date  . Gout   . Hearing loss   . Hypertension   . Kidney stones   . Reflux   . S/P epidural steroid injection 05/2018    Past Surgical History:  Procedure Laterality Date  . BACK SURGERY    . CORONARY ARTERY BYPASS GRAFT N/A 07/20/2017   Procedure: CORONARY ARTERY BYPASS GRAFTING (CABG) times four using left internal mammary artery to LAD and endoscopically harvested right saphenous vein graft to distal circumflex, intermediate, and PD;  Surgeon: [name], MD;  Location: [facility] OR;  Service: Open Heart Surgery;  Laterality: N/A;  . EPIDURAL BLOCK INJECTION  05/2018   L3injections   . EYE SURGERY Bilateral 2013  . FOOT SURGERY Bilateral    ingrown toenails  . GALLBLADDER SURGERY    . LEFT HEART CATH AND CORONARY ANGIOGRAPHY N/A 07/17/2017   Procedure: LEFT HEART CATH AND CORONARY ANGIOGRAPHY;  Surgeon: Yvonne Kendall, MD;  Location: [facility] INVASIVE [facility] LAB;  Service: Cardiovascular;  Laterality: N/A;  . SHOULDER SURGERY Right   . SPINE SURGERY      Current Medications: Current Meds  Medication Sig  . allopurinol (ZYLOPRIM) 300 MG tablet Take 1  tablet (300 mg total) by mouth daily.  [name] Kelly Cider Vinegar 600 MG CAPS Take 600 mg by mouth daily.  [name] aspirin EC 81 MG tablet Take 2 tablets (162 mg total) by mouth daily.  [name] atorvastatin (LIPITOR) 80 MG tablet Take 1 tablet (80 mg total) by mouth daily at 6 PM.  . esomeprazole (NEXIUM) 40 MG capsule Take 1 capsule (40 mg total) by mouth daily before breakfast.  . Liniments (DEEP BLUE RELIEF) GEL Apply topically daily.  [name] losartan (COZAAR) 25 MG tablet Take 1 tablet (25 mg total) by mouth daily.  . metoprolol tartrate (LOPRESSOR) 25 MG tablet  Take 1 tablet (25 mg total) by mouth 2 (two) times daily. (Patient taking differently: Take 25 mg by mouth daily. )  . nitroGLYCERIN (NITROSTAT) 0.4 MG SL tablet Place 1 tablet (0.4 mg total) under the tongue every 5 (five) minutes as needed for chest pain.  [name] VITAMIN D, CHOLECALCIFEROL, PO Take 2,000 mg by mouth daily.     Allergies:   Doxycycline, Ace inhibitors, Codeine, and Naprosyn [naproxen]   Social History   Socioeconomic History  . Marital status: Married    Spouse name: Not on file  . Number of children: Not on file  . Years of education: Not on file  . Highest education level: Associate degree: occupational, Scientist, product/process development, or vocational program  Occupational History  . Occupation: retired  Tobacco Use  . Smoking status: Never Smoker  . Smokeless tobacco: Never Used  Vaping Use  . Vaping Use: Never used  Substance and Sexual Activity  . Alcohol use: Not Currently  . Drug use: No  . Sexual activity: Not on file  Other Topics Concern  . Not on file  Social History Narrative   Volunteers and golfs    Social Determinants of Health   Financial Resource Strain: Low Risk   . Difficulty of Paying Living Expenses: Not hard at all  Food Insecurity: No Food Insecurity  . Worried About Programme researcher, broadcasting/film/video in the Last Year: Never true  . Ran Out of Food in the Last Year: Never true  Transportation Needs: No Transportation Needs  . Lack of Transportation (Medical): No  . Lack of Transportation (Non-Medical): No  Physical Activity: Sufficiently Active  . Days of Exercise per Week: 3 days  . Minutes of Exercise per Session: 90 min  Stress: No Stress Concern Present  . Feeling of Stress : Not at all  Social Connections: Socially Integrated  . Frequency of Communication with Friends and Family: More than three times a week  . Frequency of Social Gatherings with Friends and Family: More than three times a week  . Attends Religious Services: More than 4 times per year  . Active  Member of Clubs or Organizations: Yes  . Attends Banker Meetings: More than 4 times per year  . Marital Status: Married     Family History: The patient's family history includes Diabetes in his mother; Lung cancer in his father; Stroke in his paternal grandfather.  ROS:   Please see the history of present illness.    All other systems reviewed and are negative.  EKGs/Labs/Other Studies Reviewed:    The following studies were reviewed today: EKG reveals sinus rhythm and nonspecific ST-T changes.   Recent Labs: 08/28/2019: ALT 27; Hemoglobin 16.1; Platelets 198 09/28/2019: BUN 17; Creatinine, Ser 1.04; Magnesium 2.1; Potassium 4.5; Sodium 139; TSH 1.960  Recent Lipid Panel    Component Value Date/Time   CHOL  130 09/28/2019 1137   CHOL 142 06/03/2017 0811   TRIG 142 09/28/2019 1137   TRIG 123 06/03/2017 0811   HDL 40 09/28/2019 1137   CHOLHDL 3.8 09/27/2018 0903   CHOLHDL 3.0 07/18/2017 0249   VLDL 10 07/18/2017 0249   VLDL 25 06/03/2017 0811   LDLCALC 65 09/28/2019 1137    Physical Exam:    VS:  BP (!) 150/92   Pulse (!) 52   Ht 5\' 9"  (1.753 m)   Wt 209 lb (94.8 kg)   SpO2 98%   BMI 30.86 kg/m     Wt Readings from Last 3 Encounters:  10/05/19 209 lb (94.8 kg)  09/28/19 210 lb (95.3 kg)  09/02/19 211 lb (95.7 kg)     GEN: Patient is in no acute distress HEENT: Normal NECK: No JVD; No carotid bruits LYMPHATICS: No lymphadenopathy CARDIAC: Hear sounds regular, 2/6 systolic murmur at the apex. RESPIRATORY:  Clear to auscultation without rales, wheezing or rhonchi  ABDOMEN: Soft, non-tender, non-distended MUSCULOSKELETAL:  No edema; No deformity  SKIN: Warm and dry NEUROLOGIC:  Alert and oriented x 3 PSYCHIATRIC:  Normal affect   Signed, 11/02/19, MD  10/05/2019 8:29 AM    [facility]

## 2019-10-11 ENCOUNTER — Other Ambulatory Visit: Payer: Self-pay

## 2019-10-11 DIAGNOSIS — K219 Gastro-esophageal reflux disease without esophagitis: Secondary | ICD-10-CM

## 2019-10-11 MED ORDER — ESOMEPRAZOLE MAGNESIUM 40 MG PO CPDR: 40.0000 mg | DELAYED_RELEASE_CAPSULE | Freq: Every day | ORAL | 1 refills | Status: DC

## 2019-10-11 NOTE — Telephone Encounter (Signed)
Patient last seen 09/28/19

## 2019-10-13 ENCOUNTER — Ambulatory Visit (INDEPENDENT_AMBULATORY_CARE_PROVIDER_SITE_OTHER): Payer: Medicare Other

## 2019-10-13 DIAGNOSIS — Z8673 Personal history of transient ischemic attack (TIA), and cerebral infarction without residual deficits: Secondary | ICD-10-CM

## 2019-10-13 DIAGNOSIS — R002 Palpitations: Secondary | ICD-10-CM | POA: Diagnosis not present

## 2019-10-13 DIAGNOSIS — I2510 Atherosclerotic heart disease of native coronary artery without angina pectoris: Secondary | ICD-10-CM

## 2019-10-13 DIAGNOSIS — I10 Essential (primary) hypertension: Secondary | ICD-10-CM

## 2019-10-13 DIAGNOSIS — Z951 Presence of aortocoronary bypass graft: Secondary | ICD-10-CM

## 2019-10-13 DIAGNOSIS — E782 Mixed hyperlipidemia: Secondary | ICD-10-CM

## 2019-11-04 ENCOUNTER — Ambulatory Visit (INDEPENDENT_AMBULATORY_CARE_PROVIDER_SITE_OTHER): Payer: Medicare Other

## 2019-11-04 VITALS — BP 121/76 | HR 49 | Ht 69.5 in | Wt 209.0 lb

## 2019-11-04 DIAGNOSIS — Z0000 Encounter for general adult medical examination without abnormal findings: Secondary | ICD-10-CM | POA: Diagnosis not present

## 2019-11-04 NOTE — Progress Notes (Signed)
I connected with Thomas Miller today by telephone and verified that I am speaking with the correct person using two identifiers. Location patient: home Location provider: work Persons participating in the virtual visit: [name], [name].   I discussed the limitations, risks, security and privacy concerns of performing an evaluation and management service by telephone and the availability of in person appointments. I also discussed with the patient that there may be a patient responsible charge related to this service. The patient expressed understanding and verbally consented to this telephonic visit.    Interactive audio and video telecommunications were attempted between this provider and patient, however failed, due to patient having technical difficulties OR patient did not have access to video capability.  We continued and completed visit with audio only.     Vital signs may be patient reported or missing.  Subjective:   Thomas Miller is a 69 y.o. male who presents for Medicare Annual/Subsequent preventive examination.  Review of Systems     Cardiac Risk Factors include: advanced age (>23men, >46 women);hypertension;dyslipidemia;male gender;obesity (BMI >30kg/m2)     Objective:    Today's Vitals   11/04/19 0811 11/04/19 0812  BP: 121/76   Pulse: (!) 49   Weight: 209 lb (94.8 kg)   Height: 5' 9.5" (1.765 m)   PainSc:  2    Body mass index is 30.42 kg/m.  Advanced Directives 11/04/2019 08/28/2019 11/01/2018 01/02/2018 10/28/2017 07/18/2017 07/17/2017  Does Patient Have a Medical Advance Directive? Yes No Yes No Yes Yes Yes  Type of Estate agent of [place];Living will - Living will;Healthcare Power of Attorney - Living will;Healthcare Power of [employer] Power of [place];Living will Healthcare Power of [place];Living will  Does patient want to make changes to medical advance directive? - - - - - No - Patient declined -  Copy of  Healthcare Power of Attorney in Chart? No - copy requested - No - copy requested - No - copy requested No - copy requested No - copy requested  Would patient like information on creating a medical advance directive? - - - No - Patient declined - - -    Current Medications (verified) Outpatient Encounter Medications as of 11/04/2019  Medication Sig  . allopurinol (ZYLOPRIM) 300 MG tablet Take 1 tablet (300 mg total) by mouth daily.  [name] Lacerda Cider Vinegar 600 MG CAPS Take 600 mg by mouth daily.  [name] aspirin EC 81 MG tablet Take 2 tablets (162 mg total) by mouth daily.  [name] atorvastatin (LIPITOR) 80 MG tablet Take 1 tablet (80 mg total) by mouth daily at 6 PM.  . esomeprazole (NEXIUM) 40 MG capsule Take 1 capsule (40 mg total) by mouth daily before breakfast.  . Liniments (DEEP BLUE RELIEF) GEL Apply topically daily.  [name] losartan (COZAAR) 25 MG tablet Take 1 tablet (25 mg total) by mouth daily.  . metoprolol tartrate (LOPRESSOR) 25 MG tablet Take 1 tablet (25 mg total) by mouth 2 (two) times daily. (Patient taking differently: Take 25 mg by mouth daily. )  . VITAMIN D, CHOLECALCIFEROL, PO Take 2,000 mg by mouth daily.  . meclizine (ANTIVERT) 12.5 MG tablet Take 1 tablet (12.5 mg total) by mouth 3 (three) times daily as needed for dizziness. (Patient not taking: Reported on 10/05/2019)  . nitroGLYCERIN (NITROSTAT) 0.4 MG SL tablet Place 1 tablet (0.4 mg total) under the tongue every 5 (five) minutes as needed for chest pain.   No facility-administered encounter medications on file as of  11/04/2019.    Allergies (verified) Doxycycline, Ace inhibitors, Codeine, and Naprosyn [naproxen]   History: Past Medical History:  Diagnosis Date  . Gout   . Hearing loss   . Hypertension   . Kidney stones   . Reflux   . S/P epidural steroid injection 05/2018   Past Surgical History:  Procedure Laterality Date  . BACK SURGERY    . CORONARY ARTERY BYPASS GRAFT N/A 07/20/2017   Procedure: CORONARY ARTERY  BYPASS GRAFTING (CABG) times four using left internal mammary artery to LAD and endoscopically harvested right saphenous vein graft to distal circumflex, intermediate, and PD;  Surgeon: [name], MD;  Location[facility];  Service: Open Heart Surgery;  Laterality: N/A;  . EPIDURAL BLOCK INJECTION  05/2018   L3injections   . EYE SURGERY Bilateral 2013  . FOOT SURGERY Bilateral    ingrown toenails  . GALLBLADDER SURGERY    . LEFT HEART CATH AND CORONARY ANGIOGRAPHY N/A 07/17/2017   Procedure: LEFT HEART CATH AND CORONARY ANGIOGRAPHY;  Surgeon: Yvonne Kendall, MD;  Location: [facility] INVASIVE CV LAB;  Service: Cardiovascular;  Laterality: N/A;  . SHOULDER SURGERY Right   . SPINE SURGERY     Family History  Problem Relation Age of Onset  . Diabetes Mother   . Lung cancer Father   . Stroke Paternal Grandfather    Social History   Socioeconomic History  . Marital status: Married    Spouse name: Not on file  . Number of children: Not on file  . Years of education: Not on file  . Highest education level: Associate degree: occupational, Scientist, product/process development, or vocational program  Occupational History  . Occupation: retired  Tobacco Use  . Smoking status: Never Smoker  . Smokeless tobacco: Never Used  Vaping Use  . Vaping Use: Never used  Substance and Sexual Activity  . Alcohol use: Not Currently  . Drug use: No  . Sexual activity: Not on file  Other Topics Concern  . Not on file  Social History Narrative   Volunteers and golfs    Social Determinants of Health   Financial Resource Strain: Low Risk   . Difficulty of Paying Living Expenses: Not hard at all  Food Insecurity: No Food Insecurity  . Worried About Programme researcher, broadcasting/film/video in the Last Year: Never true  . Ran Out of Food in the Last Year: Never true  Transportation Needs: No Transportation Needs  . Lack of Transportation (Medical): No  . Lack of Transportation (Non-Medical): No  Physical Activity: Sufficiently Active  . Days of  Exercise per Week: 6 days  . Minutes of Exercise per Session: 30 min  Stress: No Stress Concern Present  . Feeling of Stress : Not at all  Social Connections: Socially Integrated  . Frequency of Communication with Friends and Family: More than three times a week  . Frequency of Social Gatherings with Friends and Family: More than three times a week  . Attends Religious Services: More than 4 times per year  . Active Member of Clubs or Organizations: Yes  . Attends Banker Meetings: More than 4 times per year  . Marital Status: Married    Tobacco Counseling Counseling given: Not Answered   Clinical Intake:  Pre-visit preparation completed: Yes  Pain : 0-10 Pain Score: 2  Pain Type: Chronic pain Pain Location: Hip Pain Orientation: Right Pain Radiating Towards: leg Pain Onset: More than a month ago Pain Frequency: Intermittent Pain Relieving Factors: walking helps  Pain  Relieving Factors: walking helps  Nutritional Status: BMI > 30  Obese Nutritional Risks: None Diabetes: No  How often do you need to have someone help you when you read instructions, pamphlets, or other written materials from your doctor or pharmacy?: 1 - Never What is the last grade level you completed in school?: some college  Diabetic?no  Interpreter Needed?: No  Information entered by :: NAllen LPN   Activities of Daily Living In your present state of health, do you have any difficulty performing the following activities: 11/04/2019 09/28/2019  Hearing? N N  Vision? N N  Difficulty concentrating or making decisions? N N  Walking or climbing stairs? N N  Dressing or bathing? N N  Doing errands, shopping? N N  Preparing Food and eating ? N -  Using the Toilet? N -  In the past six months, have you accidently leaked urine? N -  Do you have problems with loss of bowel control? N -  Managing your Medications? N -  Managing your Finances? N -  Housekeeping or managing your  Housekeeping? N -  Some recent data might be hidden    Patient Care Team: Marjie Skiff, NP as PCP - General (Nurse Practitioner) Revankar, Aundra Dubin, MD as PCP - Cardiology (Cardiology) Tia Alert, MD as Consulting Physician (Neurosurgery) [name], MD as Consulting Physician (Cardiothoracic Surgery) Marlowe Sax, RN as Case Manager (General Practice)  Indicate any recent Medical Services you may have received from other than [facility] providers in the past year (date may be approximate).     Assessment:   This is a routine wellness examination for Thomas Miller.  Hearing/Vision screen  Hearing Screening   125Hz  250Hz  500Hz  1000Hz  2000Hz  3000Hz  4000Hz  6000Hz  8000Hz   Right ear:           Left ear:           Vision Screening Comments: Regular eye exams, [facility]  Dietary issues and exercise activities discussed: Current Exercise Habits: Home exercise routine, Type of exercise: walking (golf), Time (Minutes): 30, Frequency (Times/Week): 6, Weekly Exercise (Minutes/Week): 180  Goals    .  "I want to protect my heart" (pt-stated)      Current Barriers:  . 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  HLD: atorvastatin 80 mg, LDL 74 on last check . Afib/HTN: losartan 25 mg QAM, metoprolol tartrate 25 mg BID, ASA 162 mg daily o CHADS2VASc score of 3 (age, HTN, vascular disease) o Patient notes that he has had some "left sided chest tightening" occasionally in the past few weeks, resolves with rest. Denies radiation, denies associated lightheadedness/dizziness. Has not felt he needed NTG for it o Reports home BP this morning 115/72, HR 55. Notes all SBP <130 . Gout; uric acid well controlled on last check < 6 mg/dL on allopurinol mg daily   Pharmacist Clinical Goal(s):  Over the next 90 days, patient will work with PharmD, primary care provider, and cardiologist to address needs related to optimized cardiovascular risk reduction  Interventions: . Comprehensive  medication review performed. . Encouraged patient to schedule f/u with cardiology regarding his new left sided chest "tightening". He noted he would call soon. He understands appropriate NTG dosing . For secondary prevention of ASCVD in high risk patient, recommend targeting goal LDL <70. Patient is near goal on atorvastatin 80; consider d/c atorvastatin and start rosuvastatin 40 mg daily d/t greater efficacy on LDL lowering for optimized risk reduction .  D/t CHADS2VASc >2, patient qualifies for chronic anticoagulation for stroke prevention. He is on ASA 162 mg daily, but DOAC therapy is preferred for stroke prevention in Afib. Consider discussing initiation of DOAC w/ cardiology.   Patient Self Care Activities:  . Self administers medications as prescribed . Calls pharmacy for medication refills . Calls provider office for new concerns or questions  Initial goal documentation     .  DIET - INCREASE WATER INTAKE      Recommend drinking at least 6-8 glasses of water a day     .  Patient Stated      11/04/2019, no goals    .  RNCM: I would like to lose 20lbs (pt-stated)      Current Barriers:  [name] Knowledge Deficits related to weight loss   Nurse Case Manager Clinical Goal(s):  [name] Over the next 120 days, patient will work with [facility] to address needs related to weight loss goal of 20 lbs  Interventions:  . Discussed plans with patient for ongoing care management follow up and provided patient with direct contact information for care management team . Encouraged patient to take advantage of in season fruits and vegetables . Discussed the dangers of fried foods . Encouraged patient to continue his exercise habits of biking, walking and golfing- limited ability to do this currently due to sciatica pain and discomfort. 08-17-2019: The patient is doing well with activity level. He plays golf every Tuesday and Thursday and on Monday's once a month. The patient verbalized her walked 13,000 steps  yesterday that equates to 3.4 miles. 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  He says "we are doing okay right now"   . Reviewed weight loss strategies . Patient stated he continues to want to reduce weight. Verbalized most recent weight 207 lbs.   Patient Self Care Activities:  . Performs ADL's independently . Performs IADL's independently  Please see past updates related to this goal by clicking on the "Past Updates" button in the selected goal      .  RNCM: Pt- "I can tell when my Afib is acting up" (pt-stated)      CARE PLAN ENTRY (see longtitudinal plan of care for additional care plan information)  Current Barriers:  . Chronic Disease Management support, education, and care coordination needs related to Atrial Fibrillation, CAD, HTN, and HLD  Clinical Goal(s) related to Atrial Fibrillation, CAD, HTN, and HLD:  Over the next 120 days, patient will:  . Work with the care management team to address educational, disease management, and care coordination needs  . Begin or continue self health monitoring activities as directed today Measure and record blood pressure 3 times per week and adhere to a heart healthy diet.  Increase activity as prescribed after surgical procedure . Call provider office for new or worsened signs and symptoms Blood pressure findings outside established parameters, Shortness of breath, and New or worsened symptom related to AFIB/CAD/HLD . Call care management team with questions or concerns . Verbalize basic understanding of patient centered plan of care established today  Interventions related to Atrial Fibrillation, CAD, HTN, and HLD:  . Evaluation of current treatment plans and patient's adherence to plan as established by provider.  The patient states he is doing well at this time. Denies any new concerns related to his health. Had an episode of AFIB about 4 weeks ago on the golf course. It was hot and had been "a long day".  The patient  states that he sees his cardiologist regularly. Next appointment in September.  . Assessed patient understanding of disease states.  Has a good understanding of chronic diseases.  . 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 Knows resources are available as needed. 08-17-2019: The patient states he can tell his wife is declining but he is still able to care for her without difficulty. He says they are doing okay right now.   . Provided disease specific education to patient- checking blood pressures, regular activity for heart health and adherence to a heart healthy diet.  The patient is weighing regularly: wt today is 207.  The patient takes his blood pressure regularly: 127/75.  The patient does not always follow a heart healthy diet but he doesn't over do it with the sodium and fats. The patient is more active and playing golf at least 2 times a week. The patient feels he is at a good place with his health.  Steele Sizer. Collaborated with appropriate clinical care team members regarding patient needs- knows resources available from the CCM team: LCSW and pharmacist available for support. . Evaluation of upcoming appointments. The patient has a follow up on 09-28-2019 with the pcp and has a card appointment in September as well.   Patient Self Care Activities related to Atrial Fibrillation, CAD, HTN, and HLD:  . Patient is unable to independently self-manage chronic health conditions  Please see past updates related to this goal by clicking on the "Past Updates" button in the selected goal        Depression Screen PHQ 2/9 Scores 11/04/2019 09/28/2019 04/12/2019 11/01/2018 07/21/2018 10/28/2017 08/17/2017  PHQ - 2 Score 0 0 0 0 0 0 0  PHQ- 9 Score - 0 - - - - -    Fall Risk Fall Risk  11/04/2019 11/01/2018 07/21/2018 10/28/2017 08/17/2017  Falls in the past year? 0 1 0 No No  Number falls in past yr: -  0 0 - -  Injury with Fall? - 0 0 - -  Comment - just some hip discomfort - - -  Risk for fall due to : Medication side effect - - - -  Follow up Falls evaluation completed;Education provided;Falls prevention discussed - - - -    Any stairs in or around the home? Yes  If so, are there any without handrails? No  Home free of loose throw rugs in walkways, pet beds, electrical cords, etc? Yes  Adequate lighting in your home to reduce risk of falls? Yes   ASSISTIVE DEVICES UTILIZED TO PREVENT FALLS:  Life alert? No  Use of a cane, walker or w/c? No  Grab bars in the bathroom? Yes  Shower chair or bench in shower? No  Elevated toilet seat or a handicapped toilet? Yes   TIMED UP AND GO:  Was the test performed? No .      Cognitive Function:     6CIT Screen 11/04/2019 11/01/2018 10/28/2017 10/23/2016  What Year? 0 points 0 points 0 points 0 points  What month? 0 points 0 points 0 points 0 points  What time? 0 points 0 points 0 points 0 points  Count back from 20 0 points 0 points 0 points 0 points  Months in reverse 0 points 0 points 0 points 0 points  Repeat phrase 0 points 0 points 0 points 0 points  Total Score 0 0 0 0  Immunizations Immunization History  Administered Date(s) Administered  . Moderna SARS-COVID-2 Vaccination 09/07/2019, 10/07/2019  . Pneumococcal Conjugate-13 11/19/2015  . Td 11/10/2007  . Zoster 02/17/2009    TDAP status: Due, Education has been provided regarding the importance of this vaccine. Advised may receive this vaccine at local pharmacy or Health Dept. Aware to provide a copy of the vaccination record if obtained from local pharmacy or Health Dept. Verbalized acceptance and understanding. Flu Vaccine status: Declined, Education has been provided regarding the importance of this vaccine but patient still declined. Advised may receive this vaccine at local pharmacy or Health Dept. Aware to provide a copy of the vaccination record if obtained from  local pharmacy or Health Dept. Verbalized acceptance and understanding. Pneumococcal vaccine status: due for PPSV23 Covid-19 vaccine status: Completed vaccines  Qualifies for Shingles Vaccine? Yes   Zostavax completed Yes   Shingrix Completed?: No.    Education has been provided regarding the importance of this vaccine. Patient has been advised to call insurance company to determine out of pocket expense if they have not yet received this vaccine. Advised may also receive vaccine at local pharmacy or Health Dept. Verbalized acceptance and understanding.  Screening Tests Health Maintenance  Topic Date Due  . PNA vac Low Risk Adult (2 of 2 - PPSV23) 11/18/2016  . TETANUS/TDAP  11/09/2017  . INFLUENZA VACCINE  04/26/2020 (Originally 08/28/2019)  . Fecal DNA (Cologuard)  05/04/2022  . COVID-19 Vaccine  Completed  . Hepatitis C Screening  Completed    Health Maintenance  Health Maintenance Due  Topic Date Due  . PNA vac Low Risk Adult (2 of 2 - PPSV23) 11/18/2016  . TETANUS/TDAP  11/09/2017    Colorectal cancer screening: Completed 05/04/2019. Repeat every 3 years  Lung Cancer Screening: (Low Dose CT Chest recommended if Age 11-80 years, 30 pack-year currently smoking OR have quit w/in 15years.) does not qualify.   Lung Cancer Screening Referral: no  Additional Screening:  Hepatitis C Screening: does qualify; Completed 05/07/2015  Vision Screening: Recommended annual ophthalmology exams for early detection of glaucoma and other disorders of the eye. Is the patient up to date with their annual eye exam?  Yes  Who is the provider or what is the name of the office in which the patient attends annual eye exams? [facility] If pt is not established with a provider, would they like to be referred to a provider to establish care? No .   Dental Screening: Recommended annual dental exams for proper oral hygiene  Community Resource Referral / Chronic Care Management: CRR required  this visit?  No   CCM required this visit?  No      Plan:     I have personally reviewed and noted the following in the patient's chart:   . Medical and social history . Use of alcohol, tobacco or illicit drugs  . Current medications and supplements . Functional ability and status . Nutritional status . Physical activity . Advanced directives . List of other physicians . Hospitalizations, surgeries, and ER visits in previous 12 months . Vitals . Screenings to include cognitive, depression, and falls . Referrals and appointments  In addition, I have reviewed and discussed with patient certain preventive protocols, quality metrics, and best practice recommendations. A written personalized care plan for preventive services as well as general preventive health recommendations were provided to patient.     Barb Merino, LPN   [id number]   Nurse Notes:

## 2019-11-04 NOTE — Patient Instructions (Signed)
Thomas Miller , Thank you for taking time to come for your Medicare Wellness Visit. I appreciate your ongoing commitment to your health goals. Please review the following plan we discussed and let me know if I can assist you in the future.   Screening recommendations/referrals: Colonoscopy: cologuard 05/04/2019 Recommended yearly ophthalmology/optometry visit for glaucoma screening and checkup Recommended yearly dental visit for hygiene and checkup  Vaccinations: Influenza vaccine: decline Pneumococcal vaccine: due Tdap vaccine: due Shingles vaccine: discussed   Covid-19:  09/07/2019, 10/07/2019  Advanced directives: Please bring a copy of your POA (Power of Attorney) and/or Living Will to your next appointment.   Conditions/risks identified: none  Next appointment: Follow up in one year for your annual wellness visit.   Preventive Care 70 Years and Older, Male Preventive care refers to lifestyle choices and visits with your health care provider that can promote health and wellness. What does preventive care include?  A yearly physical exam. This is also called an annual well check.  Dental exams once or twice a year.  Routine eye exams. Ask your health care provider how often you should have your eyes checked.  Personal lifestyle choices, including:  Daily care of your teeth and gums.  Regular physical activity.  Eating a healthy diet.  Avoiding tobacco and drug use.  Limiting alcohol use.  Practicing safe sex.  Taking low doses of aspirin every day.  Taking vitamin and mineral supplements as recommended by your health care provider. What happens during an annual well check? The services and screenings done by your health care provider during your annual well check will depend on your age, overall health, lifestyle risk factors, and family history of disease. Counseling  Your health care provider may ask you questions about your:  Alcohol use.  Tobacco use.  Drug  use.  Emotional well-being.  Home and relationship well-being.  Sexual activity.  Eating habits.  History of falls.  Memory and ability to understand (cognition).  Work and work Astronomer. Screening  You may have the following tests or measurements:  Height, weight, and BMI.  Blood pressure.  Lipid and cholesterol levels. These may be checked every 5 years, or more frequently if you are over 53 years old.  Skin check.  Lung cancer screening. You may have this screening every year starting at age 43 if you have a 30-pack-year history of smoking and currently smoke or have quit within the past 15 years.  Fecal occult blood test (FOBT) of the stool. You may have this test every year starting at age 13.  Flexible sigmoidoscopy or colonoscopy. You may have a sigmoidoscopy every 5 years or a colonoscopy every 10 years starting at age 88.  Prostate cancer screening. Recommendations will vary depending on your family history and other risks.  Hepatitis C blood test.  Hepatitis B blood test.  Sexually transmitted disease (STD) testing.  Diabetes screening. This is done by checking your blood sugar (glucose) after you have not eaten for a while (fasting). You may have this done every 1-3 years.  Abdominal aortic aneurysm (AAA) screening. You may need this if you are a current or former smoker.  Osteoporosis. You may be screened starting at age 74 if you are at high risk. Talk with your health care provider about your test results, treatment options, and if necessary, the need for more tests. Vaccines  Your health care provider may recommend certain vaccines, such as:  Influenza vaccine. This is recommended every year.  Tetanus, diphtheria, and  acellular pertussis (Tdap, Td) vaccine. You may need a Td booster every 10 years.  Zoster vaccine. You may need this after age 6.  Pneumococcal 13-valent conjugate (PCV13) vaccine. One dose is recommended after age  26.  Pneumococcal polysaccharide (PPSV23) vaccine. One dose is recommended after age 52. Talk to your health care provider about which screenings and vaccines you need and how often you need them. This information is not intended to replace advice given to you by your health care provider. Make sure you discuss any questions you have with your health care provider. Document Released: 02/09/2015 Document Revised: 10/03/2015 Document Reviewed: 11/14/2014 Elsevier Interactive Patient Education  2017 [place] Prevention in the Home Falls can cause injuries. They can happen to people of all ages. There are many things you can do to make your home safe and to help prevent falls. What can I do on the outside of my home?  Regularly fix the edges of walkways and driveways and fix any cracks.  Remove anything that might make you trip as you walk through a door, such as a raised step or threshold.  Trim any bushes or trees on the path to your home.  Use bright outdoor lighting.  Clear any walking paths of anything that might make someone trip, such as rocks or tools.  Regularly check to see if handrails are loose or broken. Make sure that both sides of any steps have handrails.  Any raised decks and porches should have guardrails on the edges.  Have any leaves, snow, or ice cleared regularly.  Use sand or salt on walking paths during winter.  Clean up any spills in your garage right away. This includes oil or grease spills. What can I do in the bathroom?  Use night lights.  Install grab bars by the toilet and in the tub and shower. Do not use towel bars as grab bars.  Use non-skid mats or decals in the tub or shower.  If you need to sit down in the shower, use a plastic, non-slip stool.  Keep the floor dry. Clean up any water that spills on the floor as soon as it happens.  Remove soap buildup in the tub or shower regularly.  Attach bath mats securely with double-sided  non-slip rug tape.  Do not have throw rugs and other things on the floor that can make you trip. What can I do in the bedroom?  Use night lights.  Make sure that you have a light by your bed that is easy to reach.  Do not use any sheets or blankets that are too big for your bed. They should not hang down onto the floor.  Have a firm chair that has side arms. You can use this for support while you get dressed.  Do not have throw rugs and other things on the floor that can make you trip. What can I do in the kitchen?  Clean up any spills right away.  Avoid walking on wet floors.  Keep items that you use a lot in easy-to-reach places.  If you need to reach something above you, use a strong step stool that has a grab bar.  Keep electrical cords out of the way.  Do not use floor polish or wax that makes floors slippery. If you must use wax, use non-skid floor wax.  Do not have throw rugs and other things on the floor that can make you trip. What can I do with my stairs?  Do not leave any items on the stairs.  Make sure that there are handrails on both sides of the stairs and use them. Fix handrails that are broken or loose. Make sure that handrails are as long as the stairways.  Check any carpeting to make sure that it is firmly attached to the stairs. Fix any carpet that is loose or worn.  Avoid having throw rugs at the top or bottom of the stairs. If you do have throw rugs, attach them to the floor with carpet tape.  Make sure that you have a light switch at the top of the stairs and the bottom of the stairs. If you do not have them, ask someone to add them for you. What else can I do to help prevent falls?  Wear shoes that:  Do not have high heels.  Have rubber bottoms.  Are comfortable and fit you well.  Are closed at the toe. Do not wear sandals.  If you use a stepladder:  Make sure that it is fully opened. Do not climb a closed stepladder.  Make sure that both  sides of the stepladder are locked into place.  Ask someone to hold it for you, if possible.  Clearly mark and make sure that you can see:  Any grab bars or handrails.  First and last steps.  Where the edge of each step is.  Use tools that help you move around (mobility aids) if they are needed. These include:  Canes.  Walkers.  Scooters.  Crutches.  Turn on the lights when you go into a dark area. Replace any light bulbs as soon as they burn out.  Set up your furniture so you have a clear path. Avoid moving your furniture around.  If any of your floors are uneven, fix them.  If there are any pets around you, be aware of where they are.  Review your medicines with your doctor. Some medicines can make you feel dizzy. This can increase your chance of falling. Ask your doctor what other things that you can do to help prevent falls. This information is not intended to replace advice given to you by your health care provider. Make sure you discuss any questions you have with your health care provider. Document Released: 11/09/2008 Document Revised: 06/21/2015 Document Reviewed: 02/17/2014 Elsevier Interactive Patient Education  2017 [employer].

## 2019-11-09 ENCOUNTER — Telehealth: Payer: Medicare Other | Admitting: General Practice

## 2019-11-09 ENCOUNTER — Ambulatory Visit (INDEPENDENT_AMBULATORY_CARE_PROVIDER_SITE_OTHER): Payer: Medicare Other | Admitting: General Practice

## 2019-11-09 DIAGNOSIS — E782 Mixed hyperlipidemia: Secondary | ICD-10-CM

## 2019-11-09 DIAGNOSIS — I480 Paroxysmal atrial fibrillation: Secondary | ICD-10-CM

## 2019-11-09 DIAGNOSIS — I2510 Atherosclerotic heart disease of native coronary artery without angina pectoris: Secondary | ICD-10-CM

## 2019-11-09 DIAGNOSIS — I10 Essential (primary) hypertension: Secondary | ICD-10-CM

## 2019-11-09 NOTE — Patient Instructions (Signed)
Visit Information  Goals Addressed              This Visit's Progress   .  RNCM: I would like to lose 20lbs (pt-stated)        Current Barriers:  [name] Knowledge Deficits related to weight loss   Nurse Case Manager Clinical Goal(s):  [name] Over the next 120 days, patient will work with [facility] to address needs related to weight loss goal of 20 lbs  Interventions:  . Discussed plans with patient for ongoing care management follow up and provided patient with direct contact information for care management team . Encouraged patient to take advantage of in season fruits and vegetables . Discussed the dangers of fried foods . Encouraged patient to continue his exercise habits of biking, walking and golfing- limited ability to do this currently due to sciatica pain and discomfort. 08-17-2019: The patient is doing well with activity level. He plays golf every Tuesday and Thursday and on Monday's once a month. The patient verbalized her walked 13,000 steps yesterday that equates to 3.4 miles. 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  He says "we are doing okay right now"  11-09-2019: The patient states he is doing well and denies any new concerns. States his wife is stable and doing well. Will continue to monitor.  . Reviewed weight loss strategies . Patient stated he continues to want to reduce weight. Verbalized most recent weight 207 lbs. 11-09-2019: Wt today is 203 pounds  Patient Self Care Activities:  . Performs ADL's independently . Performs IADL's independently  Please see past updates related to this goal by clicking on the "Past Updates" button in the selected goal      .  RNCM: Pt- "I can tell when my Afib is acting up" (pt-stated)        CARE PLAN ENTRY (see longtitudinal plan of care for additional care plan information)  Current Barriers:  . Chronic Disease Management support, education, and care coordination needs related to Atrial Fibrillation, CAD, HTN, and  HLD  Clinical Goal(s) related to Atrial Fibrillation, CAD, HTN, and HLD:  Over the next 120 days, patient will:  . Work with the care management team to address educational, disease management, and care coordination needs  . Begin or continue self health monitoring activities as directed today Measure and record blood pressure 3 times per week and adhere to a heart healthy diet.  Increase activity as prescribed after surgical procedure . Call provider office for new or worsened signs and symptoms Blood pressure findings outside established parameters, Shortness of breath, and New or worsened symptom related to AFIB/CAD/HLD . Call care management team with questions or concerns . Verbalize basic understanding of patient centered plan of care established today  Interventions related to Atrial Fibrillation, CAD, HTN, and HLD:  . Evaluation of current treatment plans and patient's adherence to plan as established by provider.  The patient states he is doing well at this time. Denies any new concerns related to his health. Had an episode of AFIB about 4 [place] ago [place]. It was hot and had been "a long day".  The patient states that he sees his cardiologist regularly. Next appointment in September. 11-09-2019: Was seen in the ED in August and saw pcp in September. Lab work was WNL.  The patient states he is doing well. Eating well and resting well. Is taking his blood pressures regularly.  Wt is stable at 203.  [name]  Assessed patient understanding of disease states.  Has a good understanding of chronic diseases. 11-09-2019: knows what worse looks like and to call for changes in condition.  . 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 Knows resources are available as needed. 08-17-2019: The patient states he can tell his wife is declining but he is still able to care for her without  difficulty. He says they are doing okay right now.  11-09-2019: the patient states his wife is stable and he has no needs related to caregiver needs at this time. The patient states that he knows resources are available to help if needed. Is thankful for the support of the CCM team.  . Provided disease specific education to patient- checking blood pressures, regular activity for heart health and adherence to a heart healthy diet. 11-09-2019:  The patient is weighing regularly: wt today is 203.  The patient takes his blood pressure regularly: 124/82 most recent.  The patient does not always follow a heart healthy diet but he doesn't over do it with the sodium and fats. The patient is more active and playing golf at least 2 times a week. The patient feels he is at a good place with his health.  Steele Sizer with appropriate clinical care team members regarding patient needs- knows resources available from the CCM team: LCSW and pharmacist available for support. . Evaluation of upcoming appointments. The patient has a follow up on 03-28-2020 with the pcp.  Will continue to monitor for changes and needs.   Patient Self Care Activities related to Atrial Fibrillation, CAD, HTN, and HLD:  . Patient is unable to independently self-manage chronic health conditions  Please see past updates related to this goal by clicking on the "Past Updates" button in the selected goal         Patient verbalizes understanding of instructions provided today.   Telephone follow up appointment with care management team member scheduled for: 01-18-2020 at 0900 am  Alto Denver RN, MSN, CCM Community Care Coordinator [place]  [facility] Mobile: [phone]

## 2019-11-09 NOTE — Chronic Care Management (AMB) (Signed)
Chronic Care Management   Follow Up Note   11/09/2019 Name: Thomas Miller MRN: [id number] DOB: [birth year]  Referred by: Marjie Skiff, NP Reason for referral : No chief complaint on file.   Thomas Miller is a 69 y.o. year old male who is a primary care patient of Cannady, Corrie Dandy T, NP. The CCM team was consulted for assistance with chronic disease management and care coordination needs.    Review of patient status, including review of consultants reports, relevant laboratory and other test results, and collaboration with appropriate care team members and the patient's provider was performed as part of comprehensive patient evaluation and provision of chronic care management services.    SDOH (Social Determinants of Health) assessments performed: Yes See Care Plan activities for detailed interventions related to [facility])     Outpatient Encounter Medications as of 11/09/2019  Medication Sig   allopurinol (ZYLOPRIM) 300 MG tablet Take 1 tablet (300 mg total) by mouth daily.   Bickhart Cider Vinegar 600 MG CAPS Take 600 mg by mouth daily.   aspirin EC 81 MG tablet Take 2 tablets (162 mg total) by mouth daily.   atorvastatin (LIPITOR) 80 MG tablet Take 1 tablet (80 mg total) by mouth daily at 6 PM.   esomeprazole (NEXIUM) 40 MG capsule Take 1 capsule (40 mg total) by mouth daily before breakfast.   Liniments (DEEP BLUE RELIEF) GEL Apply topically daily.   losartan (COZAAR) 25 MG tablet Take 1 tablet (25 mg total) by mouth daily.   meclizine (ANTIVERT) 12.5 MG tablet Take 1 tablet (12.5 mg total) by mouth 3 (three) times daily as needed for dizziness. (Patient not taking: Reported on 10/05/2019)   metoprolol tartrate (LOPRESSOR) 25 MG tablet Take 1 tablet (25 mg total) by mouth 2 (two) times daily. (Patient taking differently: Take 25 mg by mouth daily. )   nitroGLYCERIN (NITROSTAT) 0.4 MG SL tablet Place 1 tablet (0.4 mg total) under the tongue every 5 (five) minutes as needed for  chest pain.   VITAMIN D, CHOLECALCIFEROL, PO Take 2,000 mg by mouth daily.   No facility-administered encounter medications on file as of 11/09/2019.     Objective:   BP Readings from Last 3 Encounters:  11/02/19 124/82  11/04/19 121/76  10/05/19 (!) 150/92   Goals Addressed              This Visit's Progress     RNCM: I would like to lose 20lbs (pt-stated)        Current Barriers:   Knowledge Deficits related to weight loss   Nurse Case Manager Clinical Goal(s):   Over the next 120 days, patient will work with [facility] to address needs related to weight loss goal of 20 lbs  Interventions:   Discussed plans with patient for ongoing care management follow up and provided patient with direct contact information for care management team  Encouraged patient to take advantage of in season fruits and vegetables  Discussed the dangers of fried foods  Encouraged patient to continue his exercise habits of biking, walking and golfing- limited ability to do this currently due to sciatica pain and discomfort. 08-17-2019: The patient is doing well with activity level. He plays golf every Tuesday and Thursday and on Monday's once a month. The patient verbalized her walked 13,000 steps yesterday that equates to 3.4 miles. 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  He says "we are doing okay right now"  11-09-2019: The patient states  he is doing well and denies any new concerns. States his wife is stable and doing well. Will continue to monitor.   Reviewed weight loss strategies  Patient stated he continues to want to reduce weight. Verbalized most recent weight 207 lbs. 11-09-2019: Wt today is 203 pounds  Patient Self Care Activities:   Performs ADL's independently  Performs IADL's independently  Please see past updates related to this goal by clicking on the "Past Updates" button in the selected goal        RNCM: Pt- "I can tell when my Afib is acting up"  (pt-stated)        CARE PLAN ENTRY (see longtitudinal plan of care for additional care plan information)  Current Barriers:   Chronic Disease Management support, education, and care coordination needs related to Atrial Fibrillation, CAD, HTN, and HLD  Clinical Goal(s) related to Atrial Fibrillation, CAD, HTN, and HLD:  Over the next 120 days, patient will:   Work with the care management team to address educational, disease management, and care coordination needs   Begin or continue self health monitoring activities as directed today Measure and record blood pressure 3 times per week and adhere to a heart healthy diet.  Increase activity as prescribed after surgical procedure  Call provider office for new or worsened signs and symptoms Blood pressure findings outside established parameters, Shortness of breath, and New or worsened symptom related to AFIB/CAD/HLD  Call care management team with questions or concerns  Verbalize basic understanding of patient centered plan of care established today  Interventions related to Atrial Fibrillation, CAD, HTN, and HLD:   Evaluation of current treatment plans and patient's adherence to plan as established by provider.  The patient states he is doing well at this time. Denies any new concerns related to his health. Had [place] episode of [place]. It was hot and had been "a long day".  The patient states that he sees his cardiologist regularly. Next appointment in September. 11-09-2019: Was seen in the ED in August and saw pcp in September. Lab work was WNL.  The patient states he is doing well. Eating well and resting well. Is taking his blood pressures regularly.  Wt is stable at 203.   Assessed patient understanding of disease states.  Has a good understanding of chronic diseases. 11-09-2019: knows what worse looks like and to call for changes in condition.   Assessed patient's education and care coordination needs- the  patient is the primary caregiver of his wife who has Alzheimer's- he says she is stable at this time. Knows resources are available as needed. 08-17-2019: The patient states he can tell his wife is declining but he is still able to care for her without difficulty. He says they are doing okay right now.  11-09-2019: the patient states his wife is stable and he has no needs related to caregiver needs at this time. The patient states that he knows resources are available to help if needed. Is thankful for the support of the CCM team.   Provided disease specific education to patient- checking blood pressures, regular activity for heart health and adherence to a heart healthy diet. 11-09-2019:  The patient is weighing regularly: wt today is 203.  The patient takes his blood pressure regularly: 124/82 most recent.  The patient does not always follow a heart healthy diet but he doesn't over do it with the sodium and fats. The patient is more active and  playing golf at least 2 times a week. The patient feels he is at a good place with his health.   Collaborated with appropriate clinical care team members regarding patient needs- knows resources available from the CCM team: LCSW and pharmacist available for support.  Evaluation of upcoming appointments. The patient has a follow up on 03-28-2020 with the pcp.  Will continue to monitor for changes and needs.   Patient Self Care Activities related to Atrial Fibrillation, CAD, HTN, and HLD:   Patient is unable to independently self-manage chronic health conditions  Please see past updates related to this goal by clicking on the "Past Updates" button in the selected goal          Plan:   Telephone follow up appointment with care management team member scheduled for: 01-18-2020 at 0900 am   Alto Denver RN, MSN, CCM Community Care Coordinator [place]   [facility] Mobile: [phone]

## 2019-11-17 NOTE — Addendum Note (Signed)
Addended by: Eleonore Chiquito on: 11/17/2019 09:36 AM   Modules accepted: Orders

## 2019-11-22 ENCOUNTER — Telehealth (HOSPITAL_COMMUNITY): Payer: Self-pay | Admitting: *Deleted

## 2019-11-22 NOTE — Telephone Encounter (Signed)
Left message on voicemail per DPR in reference to upcoming appointment scheduled on 11/30/19 with detailed instructions given per Myocardial Perfusion Study Information Sheet for the test. LM to arrive 15 minutes early, and that it is imperative to arrive on time for appointment to keep from having the test rescheduled. If you need to cancel or reschedule your appointment, please call the office within 24 hours of your appointment. Failure to do so may result in a cancellation of your appointment, and a $50 no show fee. Phone number given for call back for any questions. Ricky Ala

## 2019-11-29 ENCOUNTER — Other Ambulatory Visit: Payer: Self-pay

## 2019-11-29 ENCOUNTER — Ambulatory Visit (INDEPENDENT_AMBULATORY_CARE_PROVIDER_SITE_OTHER): Payer: Medicare Other

## 2019-11-29 DIAGNOSIS — I472 Ventricular tachycardia: Secondary | ICD-10-CM

## 2019-11-29 DIAGNOSIS — R931 Abnormal findings on diagnostic imaging of heart and coronary circulation: Secondary | ICD-10-CM | POA: Diagnosis not present

## 2019-11-29 DIAGNOSIS — I4729 Other ventricular tachycardia: Secondary | ICD-10-CM

## 2019-11-29 DIAGNOSIS — I2510 Atherosclerotic heart disease of native coronary artery without angina pectoris: Secondary | ICD-10-CM | POA: Diagnosis not present

## 2019-11-29 LAB — MYOCARDIAL PERFUSION IMAGING
LV dias vol: 98 mL (ref 62–150)
LV sys vol: 34 mL
Peak HR: 86 {beats}/min
Rest HR: 53 {beats}/min
SDS: 2
SRS: 3
SSS: 5
TID: 0.97

## 2019-11-29 MED ORDER — REGADENOSON 0.4 MG/5ML IV SOLN
0.4000 mg | Freq: Once | INTRAVENOUS | Status: AC
Start: 2019-11-29 — End: 2019-11-29
  Administered 2019-11-29: 0.4 mg via INTRAVENOUS

## 2019-11-29 MED ORDER — TECHNETIUM TC 99M TETROFOSMIN IV KIT
10.6000 | PACK | Freq: Once | INTRAVENOUS | Status: AC | PRN
  Administered 2019-11-29: 10.6 via INTRAVENOUS

## 2019-11-29 MED ORDER — TECHNETIUM TC 99M TETROFOSMIN IV KIT
31.6000 | PACK | Freq: Once | INTRAVENOUS | Status: AC | PRN
  Administered 2019-11-29: 31.6 via INTRAVENOUS

## 2019-12-21 ENCOUNTER — Other Ambulatory Visit: Payer: Self-pay | Admitting: Family Medicine

## 2019-12-21 DIAGNOSIS — E7800 Pure hypercholesterolemia, unspecified: Secondary | ICD-10-CM

## 2020-01-18 ENCOUNTER — Ambulatory Visit: Payer: Self-pay | Admitting: General Practice

## 2020-01-18 ENCOUNTER — Telehealth: Payer: Medicare Other | Admitting: General Practice

## 2020-01-18 DIAGNOSIS — I2510 Atherosclerotic heart disease of native coronary artery without angina pectoris: Secondary | ICD-10-CM

## 2020-01-18 DIAGNOSIS — E782 Mixed hyperlipidemia: Secondary | ICD-10-CM

## 2020-01-18 DIAGNOSIS — R079 Chest pain, unspecified: Secondary | ICD-10-CM

## 2020-01-18 DIAGNOSIS — I10 Essential (primary) hypertension: Secondary | ICD-10-CM

## 2020-01-18 DIAGNOSIS — I480 Paroxysmal atrial fibrillation: Secondary | ICD-10-CM

## 2020-01-18 NOTE — Chronic Care Management (AMB) (Signed)
Chronic Care Management   Follow Up Note   01/18/2020 Name: Thomas Miller MRN: [id number] DOB: 29-Dec-1950  Referred by: Marjie Skiff, NP Reason for referral : Chronic Care Management (RNCM: Chronic Disease Management and Care Coordination Needs)   ALBERTA CAIRNS is a 69 y.o. year old male who is a primary care patient of Cannady, Dorie Rank, NP. The CCM team was consulted for assistance with chronic disease management and care coordination needs.    Review of patient status, including review of consultants reports, relevant laboratory and other test results, and collaboration with appropriate care team members and the patient's provider was performed as part of comprehensive patient evaluation and provision of chronic care management services.    SDOH (Social Determinants of Health) assessments performed: Yes See Care Plan activities for detailed interventions related to [facility])     Outpatient Encounter Medications as of 01/18/2020  Medication Sig  . allopurinol (ZYLOPRIM) 300 MG tablet Take 1 tablet (300 mg total) by mouth daily.  [name] Mcnamee Cider Vinegar 600 MG CAPS Take 600 mg by mouth daily.  [name] aspirin EC 81 MG tablet Take 2 tablets (162 mg total) by mouth daily.  [name] atorvastatin (LIPITOR) 80 MG tablet Take 1 tablet (80 mg total) by mouth daily at 6 PM.  . esomeprazole (NEXIUM) 40 MG capsule Take 1 capsule (40 mg total) by mouth daily before breakfast.  . Liniments (DEEP BLUE RELIEF) GEL Apply topically daily.  [name] losartan (COZAAR) 25 MG tablet Take 1 tablet (25 mg total) by mouth daily.  . meclizine (ANTIVERT) 12.5 MG tablet Take 1 tablet (12.5 mg total) by mouth 3 (three) times daily as needed for dizziness. (Patient not taking: Reported on 10/05/2019)  . metoprolol tartrate (LOPRESSOR) 25 MG tablet Take 1 tablet (25 mg total) by mouth 2 (two) times daily. (Patient taking differently: Take 25 mg by mouth daily. )  . nitroGLYCERIN (NITROSTAT) 0.4 MG SL tablet Place 1 tablet (0.4 mg  total) under the tongue every 5 (five) minutes as needed for chest pain.  [name] VITAMIN D, CHOLECALCIFEROL, PO Take 2,000 mg by mouth daily.   No facility-administered encounter medications on file as of 01/18/2020.     Objective:  BP Readings from Last 3 Encounters:  01/18/20 137/70  11/02/19 124/82  11/04/19 121/76    Goals Addressed              This Visit's Progress   .  RNCM: I would like to lose 20lbs (pt-stated)        Current Barriers:  [name] Knowledge Deficits related to weight loss   Nurse Case Manager Clinical Goal(s):  [name] Over the next 120 days, patient will work with [facility] to address needs related to weight loss goal of 20 lbs  Interventions:  . Discussed plans with patient for ongoing care management follow up and provided patient with direct contact information for care management team . Encouraged patient to take advantage of in season fruits and vegetables . Discussed the dangers of fried foods . Encouraged patient to continue his exercise habits of biking, walking and golfing- limited ability to do this currently due to sciatica pain and discomfort. 08-17-2019: The patient is doing well with activity level. He plays golf every Tuesday and Thursday and on Monday's once a month. The patient verbalized her walked 13,000 steps yesterday that equates to 3.4 miles. 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  He says "we are doing okay right now"  01-18-2020:The patient states he is having some new concerns with his heart. He is still playing golf one to two days a week. The patient is also walking daily. This helps him feel better. Is having random chest pain. Had testing with cardiologist but has not heard back from them. See new goal. States his wife is stable and doing okay.  He took her care keys and she is adjusting to this. Will continue to monitor.  . Reviewed weight loss strategies.  01-18-2020: The patient states that he has gained some weight over the  Thanksgiving holiday. Admits sometimes he gets "too close to the table".  Denies any issues with weight control.  . Patient stated he continues to want to reduce weight. Verbalized most recent weight 207 lbs. 01-18-2020: Wt today is 208 pounds  Patient Self Care Activities:  . Performs ADL's independently . Performs IADL's independently  Please see past updates related to this goal by clicking on the "Past Updates" button in the selected goal      .  RNCM: Pt- "I can tell when my Afib is acting up" (pt-stated)        CARE PLAN ENTRY (see longtitudinal plan of care for additional care plan information)  Current Barriers:  . Chronic Disease Management support, education, and care coordination needs related to Atrial Fibrillation, CAD, HTN, and HLD  Clinical Goal(s) related to Atrial Fibrillation, CAD, HTN, and HLD:  Over the next 120 days, patient will:  . Work with the care management team to address educational, disease management, and care coordination needs  . Begin or continue self health monitoring activities as directed today Measure and record blood pressure 3 times per week and adhere to a heart healthy diet.  Increase activity as prescribed after surgical procedure . Call provider office for new or worsened signs and symptoms Blood pressure findings outside established parameters, Shortness of breath, and New or worsened symptom related to AFIB/CAD/HLD . Call care management team with questions or concerns . Verbalize basic understanding of patient centered plan of care established today  Interventions related to Atrial Fibrillation, CAD, HTN, and HLD:  . Evaluation of current treatment plans and patient's adherence to plan as established by provider.  The patient states he is doing well at this time. Denies any new concerns related to his health. Had an episode of AFIB about 4 weeks ago on the golf [place]. It was hot and had been "a long day".  The patient states that he sees his  cardiologist regularly. Next appointment in September. 01-18-2020: The patient states that he had to wear a heart monitor for 5 weeks and and he had a lexiscan on 11-29-2019 and it could not be completed because he had chest pain. The patient has not heard back from the cardiologist. The RNCM advised the patient to call the cardiologist office and ask for a call back for next steps. The patient states that he is still having "random" episodes of chest pain. Can not correlate them with other activities.  The patient can usually take NTG and it relieves the pain. Review of what worse looks like and to call the provider to see if additional testing needs to be done to determine the best course of action. The patient is also having "ear pain".  Ask for recommendations for an ENT. Will send an in basket message to the pcp and request ENT provider. The patient ask the [facility] or PCP to email the information to him at [email or web address] .  Assessed patient understanding of disease states.  Has a good understanding of chronic diseases. 01-18-2020:Review of what worse looks like and to call for changes in condition. Has a couple of new issues going on related to chronic conditions. Advised to talk to provider about changes and recommendations.  . 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 Knows resources are available as needed. 08-17-2019: The patient states he can tell his wife is declining but he is still able to care for her without difficulty. He says they are doing okay right now.  01-18-2020: the patient states his wife is stable and he has no needs related to caregiver needs at this time. The patient states that he knows resources are available to help if needed. Is thankful for the support of the CCM team. Education on seeking help for worsening CP that is unresolved by NTG.  Also ask the patient  to reach out to his cardiologist for further evaluation and recommendations. The patient states if they called with results of his Lexiscan he did not get the message. Per my Chart the patient did not completed the Lexiscan due to chest pain. Also ask the patient to seek care for unresolved ear pain and discomfort. States he thinks something is leaking but he does not know the exact problem.  . Provided disease specific education to patient- checking blood pressures, regular activity for heart health and adherence to a heart healthy diet. 01-18-2020  The patient is weighing regularly: wt today is 208.  The patient takes his blood pressure regularly: 137/70 with heart rate of 48 today.   The patient does not always follow a heart healthy diet but he doesn't over do it with the sodium and fats. The patient is more active and playing golf at least 2 times a week. Walks regularly and was getting ready to go walking after RNCM call this am. The patient feels he is at a good place with his health.  Steele Sizer with appropriate clinical care team members regarding patient needs- knows resources available from the CCM team: LCSW and pharmacist available for support. . Evaluation of upcoming appointments. The patient has a follow up on 03-28-2020 with the pcp.  Will continue to monitor for changes and needs.   Patient Self Care Activities related to Atrial Fibrillation, CAD, HTN, and HLD:  . Patient is unable to independently self-manage chronic health conditions  Please see past updates related to this goal by clicking on the "Past Updates" button in the selected goal           Plan:   Telephone follow up appointment with care management team member scheduled for: 03-14-2020 at 0900 am   Alto Denver RN, MSN, CCM Community Care Coordinator [place]  [facility] Mobile: [phone]

## 2020-01-18 NOTE — Patient Instructions (Signed)
Visit Information  Goals Addressed              This Visit's Progress   .  RNCM: I would like to lose 20lbs (pt-stated)        Current Barriers:  [name] Knowledge Deficits related to weight loss   Nurse Case Manager Clinical Goal(s):  [name] Over the next 120 days, patient will work with [facility] to address needs related to weight loss goal of 20 lbs  Interventions:  . Discussed plans with patient for ongoing care management follow up and provided patient with direct contact information for care management team . Encouraged patient to take advantage of in season fruits and vegetables . Discussed the dangers of fried foods . Encouraged patient to continue his exercise habits of biking, walking and golfing- limited ability to do this currently due to sciatica pain and discomfort. 08-17-2019: The patient is doing well with activity level. He plays golf every Tuesday and Thursday and on Monday's once a month. The patient verbalized her walked 13,000 steps yesterday that equates to 3.4 miles. 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  He says "we are doing okay right now"  01-18-2020:The patient states he is having some new concerns with his heart. He is still playing golf one to two days a week. The patient is also walking daily. This helps him feel better. Is having random chest pain. Had testing with cardiologist but has not heard back from them. See new goal. States his wife is stable and doing okay.  He took her care keys and she is adjusting to this. Will continue to monitor.  . Reviewed weight loss strategies.  01-18-2020: The patient states that he has gained some weight over the Thanksgiving holiday. Admits sometimes he gets "too close to the table".  Denies any issues with weight control.  . Patient stated he continues to want to reduce weight. Verbalized most recent weight 207 lbs. 01-18-2020: Wt today is 208 pounds  Patient Self Care Activities:  . Performs ADL's  independently . Performs IADL's independently  Please see past updates related to this goal by clicking on the "Past Updates" button in the selected goal      .  RNCM: Pt- "I can tell when my Afib is acting up" (pt-stated)        CARE PLAN ENTRY (see longtitudinal plan of care for additional care plan information)  Current Barriers:  . Chronic Disease Management support, education, and care coordination needs related to Atrial Fibrillation, CAD, HTN, and HLD  Clinical Goal(s) related to Atrial Fibrillation, CAD, HTN, and HLD:  Over the next 120 days, patient will:  . Work with the care management team to address educational, disease management, and care coordination needs  . Begin or continue self health monitoring activities as directed today Measure and record blood pressure 3 times per week and adhere to a heart healthy diet.  Increase activity as prescribed after surgical procedure . Call provider office for new or worsened signs and symptoms Blood pressure findings outside established parameters, Shortness of breath, and New or worsened symptom related to AFIB/CAD/HLD . Call care management team with questions or concerns . Verbalize basic understanding of patient centered plan of care established today  Interventions related to Atrial Fibrillation, CAD, HTN, and HLD:  . Evaluation of current treatment plans and patient's adherence to plan as established by provider.  The patient states he is doing well at this time. Denies any new concerns  related to his health. Had an episode of AFIB about 4 weeks ago on the golf [place]. It was hot and had been "a long day".  The patient states that he sees his cardiologist regularly. Next appointment in September. 01-18-2020: The patient states that he had to wear a heart monitor for 5 weeks and and he had a lexiscan on 11-29-2019 and it could not be completed because he had chest pain. The patient has not heard back from the cardiologist. The RNCM  advised the patient to call the cardiologist office and ask for a call back for next steps. The patient states that he is still having "random" episodes of chest pain. Can not correlate them with other activities.  The patient can usually take NTG and it relieves the pain. Review of what worse looks like and to call the provider to see if additional testing needs to be done to determine the best course of action. The patient is also having "ear pain".  Ask for recommendations for an ENT. Will send an in basket message to the pcp and request ENT provider. The patient ask the [facility] or PCP to email the information to him at [email or web address] . Assessed patient understanding of disease states.  Has a good understanding of chronic diseases. 01-18-2020:Review of what worse looks like and to call for changes in condition. Has a couple of new issues going on related to chronic conditions. Advised to talk to provider about changes and recommendations.  . 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 Knows resources are available as needed. 08-17-2019: The patient states he can tell his wife is declining but he is still able to care for her without difficulty. He says they are doing okay right now.  01-18-2020: the patient states his wife is stable and he has no needs related to caregiver needs at this time. The patient states that he knows resources are available to help if needed. Is thankful for the support of the CCM team. Education on seeking help for worsening CP that is unresolved by NTG.  Also ask the patient to reach out to his cardiologist for further evaluation and recommendations. The patient states if they called with results of his Lexiscan he did not get the message. Per my Chart the patient did not completed the Lexiscan due to chest pain. Also ask the patient to seek care for unresolved  ear pain and discomfort. States he thinks something is leaking but he does not know the exact problem.  . Provided disease specific education to patient- checking blood pressures, regular activity for heart health and adherence to a heart healthy diet. 01-18-2020  The patient is weighing regularly: wt today is 208.  The patient takes his blood pressure regularly: 137/70 with heart rate of 48 today.   The patient does not always follow a heart healthy diet but he doesn't over do it with the sodium and fats. The patient is more active and playing golf at least 2 times a week. Walks regularly and was getting ready to go walking after RNCM call this am. The patient feels he is at a good place with his health.  Steele Sizer with appropriate clinical care team members regarding patient needs- knows resources available from the CCM team: LCSW and pharmacist available for support. . Evaluation of upcoming appointments. The patient has a follow up on 03-28-2020 with the  pcp.  Will continue to monitor for changes and needs.   Patient Self Care Activities related to Atrial Fibrillation, CAD, HTN, and HLD:  . Patient is unable to independently self-manage chronic health conditions  Please see past updates related to this goal by clicking on the "Past Updates" button in the selected goal         The patient verbalized understanding of instructions, educational materials, and care plan provided today and declined offer to receive copy of patient instructions, educational materials, and care plan.   Telephone follow up appointment with care management team member scheduled for: 03-14-2020 at 0900 am  Alto Denver RN, MSN, CCM Community Care Coordinator [place]  [facility] Mobile: [phone]

## 2020-02-09 ENCOUNTER — Encounter: Payer: Self-pay | Admitting: Family Medicine

## 2020-02-09 ENCOUNTER — Telehealth (INDEPENDENT_AMBULATORY_CARE_PROVIDER_SITE_OTHER): Payer: Medicare Other | Admitting: Family Medicine

## 2020-02-09 VITALS — BP 141/81 | HR 68 | Wt 201.0 lb

## 2020-02-09 DIAGNOSIS — U071 COVID-19: Secondary | ICD-10-CM | POA: Diagnosis not present

## 2020-02-09 MED ORDER — HYDROCOD POLST-CPM POLST ER 10-8 MG/5ML PO SUER: 5.0000 mL | Freq: Two times a day (BID) | ORAL | 0 refills | Status: DC | PRN

## 2020-02-09 MED ORDER — PREDNISONE 50 MG PO TABS: 50.0000 mg | ORAL_TABLET | Freq: Every day | ORAL | 0 refills | Status: DC

## 2020-02-09 MED ORDER — BENZONATATE 200 MG PO CAPS: 200.0000 mg | ORAL_CAPSULE | Freq: Two times a day (BID) | ORAL | 0 refills | Status: DC | PRN

## 2020-02-09 NOTE — Progress Notes (Signed)
BP (!) 141/81   Pulse 68   Wt 201 lb (91.2 kg)   BMI 29.26 kg/m    Subjective:    Patient ID: Thomas Miller, male    DOB: [birth year], 70 y.o.   MRN: [id number]  HPI: Thomas Miller is a 70 y.o. male  Chief Complaint  Patient presents with  . Covid Positive    Pt has cough, headache, ST, body aches. Symptoms began Tuesday, took a at home test for covid and yesterday and it was positive.    UPPER RESPIRATORY TRACT INFECTION Duration: 2 days Worst symptom: lost 10 lbs, body aches, very thirsty Fever: yes Cough: yes Shortness of breath: no Wheezing: yes Chest pain: no Chest tightness: no Chest congestion: no Nasal congestion: yes Runny nose: yes Post nasal drip: yes Sneezing: no Sore throat: yes Swollen glands: no Sinus pressure: yes Headache: yes Face pain: yes Toothache: no Ear pain: no  Ear pressure: no  Eyes red/itching:no Eye drainage/crusting: no  Vomiting: no Rash: no Fatigue: yes Sick contacts: yes Strep contacts: no  Context: stable Recurrent sinusitis: no Relief with OTC cold/cough medications: no  Treatments attempted: none   Relevant past medical, surgical, family and social history reviewed and updated as indicated. Interim medical history since our last visit reviewed. Allergies and medications reviewed and updated.  Review of Systems  Constitutional: Positive for fatigue and fever. Negative for activity change, appetite change, chills, diaphoresis and unexpected weight change.  HENT: Positive for congestion, postnasal drip, rhinorrhea, sinus pressure and sore throat. Negative for dental problem, drooling, ear discharge, ear pain, facial swelling, hearing loss, mouth sores, nosebleeds, sinus pain, sneezing, tinnitus, trouble swallowing and voice change.   Respiratory: Positive for cough and shortness of breath. Negative for apnea, choking, chest tightness, wheezing and stridor.   Cardiovascular: Negative.   Gastrointestinal: Negative.    Musculoskeletal: Positive for arthralgias and myalgias. Negative for back pain, gait problem, joint swelling, neck pain and neck stiffness.  Skin: Negative.   Psychiatric/Behavioral: Negative.     Per HPI unless specifically indicated above     Objective:    BP (!) 141/81   Pulse 68   Wt 201 lb (91.2 kg)   BMI 29.26 kg/m   Wt Readings from Last 3 Encounters:  02/09/20 201 lb (91.2 kg)  01/18/20 208 lb (94.3 kg)  11/29/19 203 lb (92.1 kg)    Physical Exam Vitals and nursing note reviewed.  Constitutional:      General: He is not in acute distress.    Appearance: Normal appearance. He is not ill-appearing, toxic-appearing or diaphoretic.  HENT:     Head: Normocephalic and atraumatic.     Right Ear: External ear normal.     Left Ear: External ear normal.     Nose: Nose normal.     Mouth/Throat:     Mouth: Mucous membranes are moist.     Pharynx: Oropharynx is clear.  Eyes:     General: No scleral icterus.       Right eye: No discharge.        Left eye: No discharge.     Conjunctiva/sclera: Conjunctivae normal.     Pupils: Pupils are equal, round, and reactive to light.  Pulmonary:     Effort: Pulmonary effort is normal. No respiratory distress.     Comments: Speaking in full sentences Musculoskeletal:        General: Normal range of motion.     Cervical back: Normal range of motion.  Skin:    Coloration: Skin is not jaundiced or pale.     Findings: No bruising, erythema, lesion or rash.  Neurological:     Mental Status: He is alert and oriented to person, place, and time. Mental status is at baseline.  Psychiatric:        Mood and Affect: Mood normal.        Behavior: Behavior normal.        Thought Content: Thought content normal.        Judgment: Judgment normal.     Results for orders placed or performed in visit on 11/29/19  MYOCARDIAL PERFUSION IMAGING  Result Value Ref Range   Rest HR 53 bpm   Rest BP 164/89 mmHg   Peak HR 86 bpm   Peak BP 142/74  mmHg   SSS 5    SRS 3    SDS 2    TID 0.97    LV sys vol 34 mL   LV dias vol 98 62 - 150 mL      Assessment & Plan:   Problem List Items Addressed This Visit   None   Visit Diagnoses    COVID-19    -  Primary   Self-quarantine until 10 days after symptoms. Will treat symptoms with tussionex, tessalon perles and prednisone. Will refer to MAB infusion. Call with concerns   Relevant Orders   Ambulatory referral for Covid Treatment       Follow up plan: Return if symptoms worsen or fail to improve.    . This visit was completed via MyChart due to the restrictions of the COVID-19 pandemic. All issues as above were discussed and addressed. Physical exam was done as above through visual confirmation on MyChart. If it was felt that the patient should be evaluated in the office, they were directed there. The patient verbally consented to this visit. . Location of the patient: home . Location of the provider: home . Those involved with this call:  . Provider: Olevia Perches, DO . CMA: Rolley Sims, CMA . Front Desk/Registration: Harriet Pho  . Time spent on call: 15 minutes with patient face to face via video conference. More than 50% of this time was spent in counseling and coordination of care. 23 minutes total spent in review of patient's record and preparation of their chart.

## 2020-03-14 ENCOUNTER — Ambulatory Visit (INDEPENDENT_AMBULATORY_CARE_PROVIDER_SITE_OTHER): Payer: Medicare Other | Admitting: General Practice

## 2020-03-14 ENCOUNTER — Telehealth: Payer: Medicare Other | Admitting: General Practice

## 2020-03-14 DIAGNOSIS — I10 Essential (primary) hypertension: Secondary | ICD-10-CM | POA: Diagnosis not present

## 2020-03-14 DIAGNOSIS — I2510 Atherosclerotic heart disease of native coronary artery without angina pectoris: Secondary | ICD-10-CM

## 2020-03-14 DIAGNOSIS — E782 Mixed hyperlipidemia: Secondary | ICD-10-CM | POA: Diagnosis not present

## 2020-03-14 DIAGNOSIS — U071 COVID-19: Secondary | ICD-10-CM

## 2020-03-14 NOTE — Patient Instructions (Signed)
Visit Information  PATIENT GOALS: Patient Care Plan: RNCM: Coronary Artery Disease (Adult) and HLD    Problem Identified: RNCM: Disease Progression (Coronary Artery Disease)   Priority: Medium    Goal: RNCM: Disease Progression Prevented or Minimized   Priority: Medium  Note:   Current Barriers:  . Poorly controlled hyperlipidemia, complicated by caregiver stress, recent diagnosis of COVID 19 in January 2022 . Current antihyperlipidemic regimen: Lipitor 80 mg QD . Most recent lipid panel:     Component Value Date/Time   CHOL 130 09/28/2019 1137   CHOL 142 06/03/2017 0811   TRIG 142 09/28/2019 1137   TRIG 123 06/03/2017 0811   HDL 40 09/28/2019 1137   CHOLHDL 3.8 09/27/2018 0903   CHOLHDL 3.0 07/18/2017 0249   VLDL 10 07/18/2017 0249   VLDL 25 06/03/2017 0811   LDLCALC 65 09/28/2019 1137 .   [name] ASCVD risk enhancing conditions: age >93, CAD,  HTN,  History of stroke  . Does not contact provider office for questions/concerns  RN Care Manager Clinical Goal(s):  [name] Over the next 120 days, patient will work with Consulting civil engineer, providers, and care team towards execution of optimized self-health management plan . Over the next 120 days, patient will verbalize understanding of plan for effective management of HLD and CAD . Over the next 120 days, patient will work with [facility] and pcp  to address needs related to effective management of CAD and HLD  . Over the next 120 days, patient will attend all scheduled medical appointments: next is 03-28-2020 at 0840 am . Over the next 120 days, patient will demonstrate improved adherence to prescribed treatment plan for CAD and HLD  as evidenced bylabs WNL, following a heart healthy diet, maintaining weight, and working with the CCM team to optimize health and well being   Interventions: . Collaboration with Venita Lick, NP regarding development and update of comprehensive plan of care as evidenced by provider attestation and  co-signature . Inter-disciplinary care team collaboration (see longitudinal plan of care) . Medication review performed; medication list updated in electronic medical record.  Bertram Savin care team collaboration (see longitudinal plan of care) . Referred to pharmacy team for assistance with HLD and CAD medication management . Evaluation of current treatment plan related to HLD and CAD and patient's adherence to plan as established by provider. . Advised patient to call the office for changes in condition or questions. Also to alert the pcp for worsening sx/sx related to recent COVID 19 diagnosis in January 2022.  The patient verbalized today that he is at 90% better. Still experiencing some cough, productive at times- denies any sx and sx of infection at this time.  . Provided education to patient re: sx and sx of infection, following a heart healthy diet, taking medications as directed . Reviewed medications with patient and discussed compliance, no issues noted at this time . Reviewed scheduled/upcoming provider appointments including: 03-28-2020 at 0840 am . Discussed plans with patient for ongoing care management follow up and provided patient with direct contact information for care management team   Patient Goals/Self-Care Activities: . Over the next 120 days, patient will:   - call for medicine refill 2 or 3 days before it runs out - call if I am sick and can't take my medicine - keep a list of all the medicines I take; vitamins and herbals too - learn to read medicine labels - use a pillbox to sort medicine - use an alarm clock or  phone to remind me to take my medicine - change to whole grain breads, cereal, pasta - drink 6 to 8 glasses of water each day - eat 3 to 5 servings of fruits and vegetables each day - eat 5 or 6 small meals each day - fill half the plate with nonstarchy vegetables - limit fast food meals to no more than 1 per week - manage portion size - prepare  main meal at home 3 to 5 days each week - read food labels for fat, fiber, carbohydrates and portion size - be open to making changes - I can manage, know and watch for signs of a heart attack - if I have chest pain, call for help - learn about small changes that will make a big difference - learn my personal risk factors  - barriers to treatment adherence reviewed and addressed - functional limitation screening reviewed - healthy lifestyle promoted - medication-adherence assessment completed - medication side effects managed - rescue (action) plan developed - response to pharmacologic therapy monitored - self-awareness of signs/symptoms of worsening disease encouraged  Follow Up Plan: Telephone follow up appointment with care management team member scheduled for: 05-09-2020 at 0945 am     Task: RNCM: Alleviate Barriers to Coronary Artery Disease Therapy   Note:   Care Management Activities:    - barriers to treatment adherence reviewed and addressed - functional limitation screening reviewed - healthy lifestyle promoted - medication-adherence assessment completed - medication side effects managed - rescue (action) plan developed - response to pharmacologic therapy monitored - self-awareness of signs/symptoms of worsening disease encouraged       Patient Care Plan: RNCM: Hypertension (Adult)    Problem Identified: RNCM: Hypertension (Hypertension)   Priority: Medium    Long-Range Goal: RNCM: Hypertension Monitored   Priority: Medium  Note:   Objective:  . Last practice recorded BP readings:  BP Readings from Last 3 Encounters:  02/09/20 (!) 141/81  01/18/20 137/70  11/02/19 124/82 .   [name] Most recent eGFR/CrCl: No results found for: EGFR  No components found for: CRCL Current Barriers:  [name] Knowledge Deficits related to basic understanding of hypertension pathophysiology and self care management . Limited Social Support . Lacks social connections . Does not contact  provider office for questions/concerns Case Manager Clinical Goal(s):  [name] Over the next 120 days, patient will verbalize understanding of plan for hypertension management . Over the next 120 days, patient will attend all scheduled medical appointments: 03-28-2020 at 840 am . Over the next 120 days, patient will demonstrate improved adherence to prescribed treatment plan for hypertension as evidenced by taking all medications as prescribed, monitoring and recording blood pressure as directed, adhering to low sodium/DASH diet . Over the next 120 days, patient will demonstrate improved health management independence as evidenced by checking blood pressure as directed and notifying PCP if SBP>160 or DBP > 90, taking all medications as prescribe, and adhering to a low sodium diet as discussed. . Over the next 120 days, patient will verbalize basic understanding of hypertension disease process and self health management plan as evidenced by compliance with medications, keeping appointments, maintain active lifestyle, work with CCM team to optimize health and well being.  Interventions:  . Collaboration with Venita Lick, NP regarding development and update of comprehensive plan of care as evidenced by provider attestation and co-signature . Inter-disciplinary care team collaboration (see longitudinal plan of care) . Evaluation of current treatment plan related to hypertension self management and patient's adherence  to plan as established by provider. . Provided education to patient re: stroke prevention, s/s of heart attack and stroke, DASH diet, complications of uncontrolled blood pressure . Reviewed medications with patient and discussed importance of compliance . Discussed plans with patient for ongoing care management follow up and provided patient with direct contact information for care management team . Advised patient, providing education and rationale, to monitor blood pressure daily and record,  calling PCP for findings outside established parameters.  . Reviewed scheduled/upcoming provider appointments including: 03-28-2020 at [place] am . Advised the patient to write down questions for pcp for upcoming visit. The patient with recent COVID 19. Has a productive cough but denies any sx and sx of infection at this time. CP he has intermittently. Denies any acute findings from wearing hollister monitor. Will continue to monitor for changes.  Patient Goals/Self-Care Activities . Over the next 120 days, patient will:  - Self administers medications as prescribed Attends all scheduled provider appointments Calls provider office for new concerns, questions, or BP outside discussed parameters Checks BP and records as discussed Follows a low sodium diet/DASH diet - blood pressure trends reviewed - depression screen reviewed - home or ambulatory blood pressure monitoring encouraged Follow Up Plan: Telephone follow up appointment with care management team member scheduled for: 05-09-2020 at 0945 am   Task: RNCM: Identify and Monitor Blood Pressure Elevation   Note:   Care Management Activities:    - blood pressure trends reviewed - depression screen reviewed - home or ambulatory blood pressure monitoring encouraged         Patient verbalizes understanding of instructions provided today and agrees to view in [place].   Telephone follow up appointment with care management team member scheduled for: 05-09-2020 at [place] am  Noreene Larsson RN, MSN, [facility] Mobile: [phone]

## 2020-03-14 NOTE — Chronic Care Management (AMB) (Signed)
Chronic Care Management   CCM RN Visit Note  03/14/2020 Name: Thomas Miller MRN: [id number] DOB: 07/07/1950  Subjective: Thomas Miller is a 70 y.o. year old male who is a primary care patient of Cannady, Barbaraann Faster, NP. The care management team was consulted for assistance with disease management and care coordination needs.    Engaged with patient by telephone for follow up visit in response to provider referral for case management and/or care coordination services.   Consent to Services:  The patient was given information about Chronic Care Management services, agreed to services, and gave verbal consent prior to initiation of services.  Please see initial visit note for detailed documentation.   Patient agreed to services and verbal consent obtained.   Assessment: Review of patient past medical history, allergies, medications, health status, including review of consultants reports, laboratory and other test data, was performed as part of comprehensive evaluation and provision of chronic care management services.   SDOH (Social Determinants of Health) assessments and interventions performed:    CCM Care Plan  Allergies  Allergen Reactions   Doxycycline Rash   Ace Inhibitors Cough    "cough"   Codeine Other (See Comments)    Insomnia   Naprosyn [Naproxen] Rash    Outpatient Encounter Medications as of 03/14/2020  Medication Sig   allopurinol (ZYLOPRIM) 300 MG tablet Take 1 tablet (300 mg total) by mouth daily.   Simonet Cider Vinegar 600 MG CAPS Take 600 mg by mouth daily.   aspirin EC 81 MG tablet Take 2 tablets (162 mg total) by mouth daily.   atorvastatin (LIPITOR) 80 MG tablet Take 1 tablet (80 mg total) by mouth daily at 6 PM.   benzonatate (TESSALON) 200 MG capsule Take 1 capsule (200 mg total) by mouth 2 (two) times daily as needed for cough.   chlorpheniramine-HYDROcodone (TUSSIONEX PENNKINETIC ER) 10-8 MG/5ML SUER Take 5 mLs by mouth every 12 (twelve) hours as  needed.   esomeprazole (NEXIUM) 40 MG capsule Take 1 capsule (40 mg total) by mouth daily before breakfast.   Liniments (DEEP BLUE RELIEF) GEL Apply topically daily.   losartan (COZAAR) 25 MG tablet Take 1 tablet (25 mg total) by mouth daily.   metoprolol tartrate (LOPRESSOR) 25 MG tablet Take 1 tablet (25 mg total) by mouth 2 (two) times daily. (Patient taking differently: Take 25 mg by mouth daily.)   nitroGLYCERIN (NITROSTAT) 0.4 MG SL tablet Place 1 tablet (0.4 mg total) under the tongue every 5 (five) minutes as needed for chest pain.   predniSONE (DELTASONE) 50 MG tablet Take 1 tablet (50 mg total) by mouth daily with breakfast.   VITAMIN D, CHOLECALCIFEROL, PO Take 2,000 mg by mouth daily.   No facility-administered encounter medications on file as of 03/14/2020.    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   Advanced care planning/counseling discussion 11/27/2016   Insomnia 05/19/2016   Hypertension 10/25/2014   Gout 10/25/2014   GERD (gastroesophageal reflux disease) 10/25/2014   Hyperlipemia 10/25/2014   BPH (benign prostatic hyperplasia) 10/25/2014    Conditions to be addressed/monitored:CAD, HTN, HLD and COVID 19  Care Plan : RNCM: Coronary Artery Disease (Adult) and HLD  Updates made by Vanita Ingles since 03/14/2020 12:00 AM    Problem: RNCM: Disease Progression (Coronary Artery  Disease)   Priority: Medium    Goal: RNCM: Disease Progression Prevented or Minimized   Priority: Medium  Note:   Current Barriers:   Poorly controlled hyperlipidemia, complicated by caregiver stress, recent diagnosis of COVID 19 in January 2022  Current antihyperlipidemic regimen: Lipitor 80 mg QD  Most recent lipid panel:     Component Value Date/Time   CHOL 130 09/28/2019  1137   CHOL 142 06/03/2017 0811   TRIG 142 09/28/2019 1137   TRIG 123 06/03/2017 0811   HDL 40 09/28/2019 1137   CHOLHDL 3.8 09/27/2018 0903   CHOLHDL 3.0 07/18/2017 0249   VLDL 10 07/18/2017 0249   VLDL 25 06/03/2017 0811   LDLCALC 65 09/28/2019 1137     ASCVD risk enhancing conditions: age >54, CAD,  HTN,  History of stroke   Does not contact provider office for questions/concerns  RN Care Manager Clinical Goal(s):   Over the next 120 days, patient will work with Consulting civil engineer, providers, and care team towards execution of optimized self-health management plan  Over the next 120 days, patient will verbalize understanding of plan for effective management of HLD and CAD  Over the next 120 days, patient will work with [facility] and pcp  to address needs related to effective management of CAD and HLD   Over the next 120 days, patient will attend all scheduled medical appointments: next is 03-28-2020 at 0840 am  Over the next 120 days, patient will demonstrate improved adherence to prescribed treatment plan for CAD and HLD  as evidenced bylabs WNL, following a heart healthy diet, maintaining weight, and working with the CCM team to optimize health and well being   Interventions:  Collaboration with Venita Lick, NP regarding development and update of comprehensive plan of care as evidenced by provider attestation and co-signature  Inter-disciplinary care team collaboration (see longitudinal plan of care)  Medication review performed; medication list updated in electronic medical record.   Inter-disciplinary care team collaboration (see longitudinal plan of care)  Referred to pharmacy team for assistance with HLD and CAD medication management  Evaluation of current treatment plan related to HLD and CAD and patient's adherence to plan as established by provider.  Advised patient to call the office for changes in condition or questions. Also to alert the pcp for worsening sx/sx  related to recent COVID 19 diagnosis in January 2022.  The patient verbalized today that he is at 90% better. Still experiencing some cough, productive at times- denies any sx and sx of infection at this time.   Provided education to patient re: sx and sx of infection, following a heart healthy diet, taking medications as directed  Reviewed medications with patient and discussed compliance, no issues noted at this time  Reviewed scheduled/upcoming provider appointments including: 03-28-2020 at 0840 am  Discussed plans with patient for ongoing care management follow up and provided patient with direct contact information for care management team   Patient Goals/Self-Care Activities:  Over the next 120 days, patient will:   - call for medicine refill 2 or 3 days before it runs out - call if I am sick and can't take my medicine - keep a list of all the medicines I take; vitamins and herbals too - learn to read medicine labels - use a pillbox to sort medicine - use an alarm clock or phone to remind me to take my medicine - change to whole grain breads, cereal, pasta - drink 6 to 8 glasses of water  each day - eat 3 to 5 servings of fruits and vegetables each day - eat 5 or 6 small meals each day - fill half the plate with nonstarchy vegetables - limit fast food meals to no more than 1 per week - manage portion size - prepare main meal at home 3 to 5 days each week - read food labels for fat, fiber, carbohydrates and portion size - be open to making changes - I can manage, know and watch for signs of a heart attack - if I have chest pain, call for help - learn about small changes that will make a big difference - learn my personal risk factors  - barriers to treatment adherence reviewed and addressed - functional limitation screening reviewed - healthy lifestyle promoted - medication-adherence assessment completed - medication side effects managed - rescue (action) plan developed -  response to pharmacologic therapy monitored - self-awareness of signs/symptoms of worsening disease encouraged  Follow Up Plan: Telephone follow up appointment with care management team member scheduled for: 05-09-2020 at 0945 am     Task: RNCM: Alleviate Barriers to Coronary Artery Disease Therapy   Note:   Care Management Activities:    - barriers to treatment adherence reviewed and addressed - functional limitation screening reviewed - healthy lifestyle promoted - medication-adherence assessment completed - medication side effects managed - rescue (action) plan developed - response to pharmacologic therapy monitored - self-awareness of signs/symptoms of worsening disease encouraged       Care Plan : RNCM: Hypertension (Adult)  Updates made by Vanita Ingles since 03/14/2020 12:00 AM    Problem: RNCM: Hypertension (Hypertension)   Priority: Medium    Long-Range Goal: RNCM: Hypertension Monitored   Priority: Medium  Note:   Objective:   Last practice recorded BP readings:  BP Readings from Last 3 Encounters:  02/09/20 (!) 141/81  01/18/20 137/70  11/02/19 124/82     Most recent eGFR/CrCl: No results found for: EGFR  No components found for: CRCL Current Barriers:   Knowledge Deficits related to basic understanding of hypertension pathophysiology and self care management  Limited Social Support  Lacks social connections  Does not contact provider office for questions/concerns Case Manager Clinical Goal(s):   Over the next 120 days, patient will verbalize understanding of plan for hypertension management  Over the next 120 days, patient will attend all scheduled medical appointments: 03-28-2020 at 840 am  Over the next 120 days, patient will demonstrate improved adherence to prescribed treatment plan for hypertension as evidenced by taking all medications as prescribed, monitoring and recording blood pressure as directed, adhering to low sodium/DASH diet  Over the  next 120 days, patient will demonstrate improved health management independence as evidenced by checking blood pressure as directed and notifying PCP if SBP>160 or DBP > 90, taking all medications as prescribe, and adhering to a low sodium diet as discussed.  Over the next 120 days, patient will verbalize basic understanding of hypertension disease process and self health management plan as evidenced by compliance with medications, keeping appointments, maintain active lifestyle, work with CCM team to optimize health and well being.  Interventions:   Collaboration with Venita Lick, NP regarding development and update of comprehensive plan of care as evidenced by provider attestation and co-signature  Inter-disciplinary care team collaboration (see longitudinal plan of care)  Evaluation of current treatment plan related to hypertension self management and patient's adherence to plan as established by provider.  Provided education to patient re: stroke prevention,  s/s of heart attack and stroke, DASH diet, complications of uncontrolled blood pressure  Reviewed medications with patient and discussed importance of compliance  Discussed plans with patient for ongoing care management follow up and provided patient with direct contact information for care management team  Advised patient, providing education and rationale, to monitor blood pressure daily and record, calling PCP for findings outside established parameters.   Reviewed scheduled/upcoming provider appointments including: 03-28-2020 at [place] am  Advised the patient to write down questions for pcp for upcoming visit. The patient with recent COVID 19. Has a productive cough but denies any sx and sx of infection at this time. CP he has intermittently. Denies any acute findings from wearing hollister monitor. Will continue to monitor for changes.  Patient Goals/Self-Care Activities  Over the next 120 days, patient will:  - Self  administers medications as prescribed Attends all scheduled provider appointments Calls provider office for new concerns, questions, or BP outside discussed parameters Checks BP and records as discussed Follows a low sodium diet/DASH diet - blood pressure trends reviewed - depression screen reviewed - home or ambulatory blood pressure monitoring encouraged Follow Up Plan: Telephone follow up appointment with care management team member scheduled for: 05-09-2020 at 0945 am   Task: RNCM: Identify and Monitor Blood Pressure Elevation   Note:   Care Management Activities:    - blood pressure trends reviewed - depression screen reviewed - home or ambulatory blood pressure monitoring encouraged         Plan:Telephone follow up appointment with care management team member scheduled for:  05-09-2020 at [place] am  [place], MSN, [facility] Mobile: [phone]

## 2020-03-19 ENCOUNTER — Other Ambulatory Visit: Payer: Self-pay | Admitting: Nurse Practitioner

## 2020-03-19 DIAGNOSIS — E7800 Pure hypercholesterolemia, unspecified: Secondary | ICD-10-CM

## 2020-03-19 DIAGNOSIS — M1000 Idiopathic gout, unspecified site: Secondary | ICD-10-CM

## 2020-03-24 ENCOUNTER — Encounter: Payer: Self-pay | Admitting: Nurse Practitioner

## 2020-03-24 DIAGNOSIS — I700 Atherosclerosis of aorta: Secondary | ICD-10-CM

## 2020-03-24 DIAGNOSIS — Z8616 Personal history of COVID-19: Secondary | ICD-10-CM | POA: Insufficient documentation

## 2020-03-24 HISTORY — DX: Atherosclerosis of aorta: I70.0

## 2020-03-24 HISTORY — DX: Personal history of COVID-19: Z86.16

## 2020-03-28 ENCOUNTER — Encounter: Payer: Self-pay | Admitting: Nurse Practitioner

## 2020-03-28 ENCOUNTER — Ambulatory Visit (INDEPENDENT_AMBULATORY_CARE_PROVIDER_SITE_OTHER): Payer: Medicare Other | Admitting: Nurse Practitioner

## 2020-03-28 ENCOUNTER — Other Ambulatory Visit: Payer: Self-pay

## 2020-03-28 VITALS — BP 125/85 | HR 56 | Temp 98.0°F | Wt 209.6 lb

## 2020-03-28 DIAGNOSIS — K219 Gastro-esophageal reflux disease without esophagitis: Secondary | ICD-10-CM | POA: Diagnosis not present

## 2020-03-28 DIAGNOSIS — I480 Paroxysmal atrial fibrillation: Secondary | ICD-10-CM

## 2020-03-28 DIAGNOSIS — E782 Mixed hyperlipidemia: Secondary | ICD-10-CM | POA: Diagnosis not present

## 2020-03-28 DIAGNOSIS — M1000 Idiopathic gout, unspecified site: Secondary | ICD-10-CM | POA: Diagnosis not present

## 2020-03-28 DIAGNOSIS — I700 Atherosclerosis of aorta: Secondary | ICD-10-CM

## 2020-03-28 DIAGNOSIS — Z951 Presence of aortocoronary bypass graft: Secondary | ICD-10-CM | POA: Diagnosis not present

## 2020-03-28 DIAGNOSIS — I2510 Atherosclerotic heart disease of native coronary artery without angina pectoris: Secondary | ICD-10-CM | POA: Diagnosis not present

## 2020-03-28 DIAGNOSIS — I10 Essential (primary) hypertension: Secondary | ICD-10-CM | POA: Diagnosis not present

## 2020-03-28 NOTE — Assessment & Plan Note (Signed)
Followed by cardiology, continue this collaboration. ?

## 2020-03-28 NOTE — Assessment & Plan Note (Signed)
Chronic, stable.  No recent NTG use.  Continue collaboration with cardiology.

## 2020-03-28 NOTE — Progress Notes (Signed)
BP 125/85   Pulse (!) 56   Temp 98 F (36.7 C) (Oral)   Wt 209 lb 9.6 oz (95.1 kg)   SpO2 98%   BMI 30.51 kg/m    Subjective:    Patient ID: Thomas Miller, male    DOB: 08-Nov-1950, 70 y.o.   MRN: [id number]  HPI: Thomas Miller is a 70 y.o. male  Chief Complaint  Patient presents with  . Hyperlipidemia  . Hypertension    Patient denies having any problems or concerns at today's visit.    HYPERTENSION / HYPERLIPIDEMIA Continues on ASA, Losartan 25 MG daily, Metoprolol 25 MG BID, and Atorvastatin 80 MG daily.  Has NTG PRN, no use in months.  Recent CMP on 09/28/19 noted CRT 1.04 and GFR 72.    Last saw cardiology, Dr. Tomie China, on 11/29/19.  Diagnosis of PAF noted + CAD.  Has history of CABG x 4.  Returns to cardiology next Wednesday.  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  Reports continued issues with dizziness if stands up too fast and turns.  No falls or injuries.   Satisfied with current treatment? yes Duration of hypertension: chronic BP monitoring frequency: daily BP range: 120-130/70 range at home -- HR 50-60 range BP medication side effects: no Duration of hyperlipidemia: chronic Cholesterol medication side effects: no Cholesterol supplements: none Medication compliance: good compliance Aspirin: yes Recent stressors: no Recurrent headaches: no Visual changes: occasional -- is seeing eye doctor Palpitations: none Dyspnea: no Chest pain: occasional which he is following with cardiology Lower extremity edema: no Dizzy/lightheaded: yes   GERD Continues on Nexium daily. GERD control status: controlled  Satisfied with current treatment? yes Heartburn frequency: none Medication side effects: no  Medication compliance: stable Previous GERD medications: none Antacid use frequency:  none Dysphagia:  no Odynophagia:  no Hematemesis: no Blood in stool: no EGD: years ago   GOUT Continues on daily Allopurinol.  Has not had a flare in quite awhile. March uric acid level 3.2.  Does endorse some left sided jaw and ear pain on occasion that wakes him up at night.  Mostly notices this when lies on that side. Duration:chronic Swelling: no Redness: no Trauma: no Recent dietary change or indiscretion: no Fevers: no Nausea/vomiting: no Status:  stable  Relevant past medical, surgical, family and social history reviewed and updated as indicated. Interim medical history since our last visit reviewed. Allergies and medications reviewed and updated.  Review of Systems  Constitutional: Negative for activity change, diaphoresis, fatigue and fever.  Respiratory: Negative for cough, chest tightness, shortness of breath and wheezing.   Cardiovascular: Negative for palpitations and leg swelling.  Gastrointestinal: Negative.   Endocrine: Negative for cold intolerance, heat intolerance, polydipsia, polyphagia and polyuria.  Neurological: Negative.   Psychiatric/Behavioral: Negative.     Per HPI unless specifically indicated above     Objective:    BP 125/85   Pulse (!) 56   Temp 98 F (36.7 C) (Oral)   Wt 209 lb 9.6 oz (95.1 kg)   SpO2 98%   BMI 30.51 kg/m   Wt Readings from Last 3 Encounters:  03/28/20 209 lb 9.6 oz (95.1 kg)  02/09/20 201 lb (91.2 kg)  01/18/20 208 lb (94.3 kg)    Physical Exam Vitals and nursing note reviewed.  Constitutional:  General: He is awake. He is not in acute distress.    Appearance: He is well-developed and well-groomed. He is obese. He is not ill-appearing.  HENT:     Head: Normocephalic and atraumatic.     Right Ear: Hearing normal. No drainage.     Left Ear: Hearing normal. No drainage.  Eyes:     General: Lids are normal.        Right eye: No discharge.        Left eye: No discharge.     Conjunctiva/sclera: Conjunctivae normal.      Pupils: Pupils are equal, round, and reactive to light.  Neck:     Thyroid: No thyromegaly.     Vascular: No carotid bruit.     Trachea: Trachea normal.  Cardiovascular:     Rate and Rhythm: Normal rate and regular rhythm.     Heart sounds: Normal heart sounds, S1 normal and S2 normal. No murmur heard. No gallop.   Pulmonary:     Effort: Pulmonary effort is normal. No accessory muscle usage or respiratory distress.     Breath sounds: Normal breath sounds.  Abdominal:     General: Bowel sounds are normal.     Palpations: Abdomen is soft. There is no hepatomegaly or splenomegaly.  Musculoskeletal:        General: Normal range of motion.     Cervical back: Normal range of motion and neck supple.     Right lower leg: No edema.     Left lower leg: No edema.  Skin:    General: Skin is warm and dry.     Capillary Refill: Capillary refill takes less than 2 seconds.     Findings: No rash.  Neurological:     Mental Status: He is alert and oriented to person, place, and time.     Deep Tendon Reflexes: Reflexes are normal and symmetric.  Psychiatric:        Attention and Perception: Attention normal.        Mood and Affect: Mood normal.        Speech: Speech normal.        Behavior: Behavior normal. Behavior is cooperative.        Thought Content: Thought content normal.    Results for orders placed or performed in visit on 11/29/19  MYOCARDIAL PERFUSION IMAGING  Result Value Ref Range   Rest HR 53 bpm   Rest BP 164/89 mmHg   Peak HR 86 bpm   Peak BP 142/74 mmHg   SSS 5    SRS 3    SDS 2    TID 0.97    LV sys vol 34 mL   LV dias vol 98 62 - 150 mL      Assessment & Plan:   Problem List Items Addressed This Visit      Cardiovascular and Mediastinum   Hypertension - Primary    Chronic, ongoing with BP below goal for age today.  Recommend he continue to monitor BP at home daily + document and bring to provider visits.  Continue current medication regimen at this time and  adjust as needed. Patient is scheduled for follow-up with them next week with cardiology.  Check CMP today.  Focus on DASH diet at home.  Return in 6 months.  Refills sent in as needed.      Relevant Orders   Comprehensive metabolic panel   PAF (paroxysmal atrial fibrillation) (HCC)    Chronic, followed by cardiology.  On BB, but no anticoagulant at this time.  Continue current medication regimen as recommended by them.        CAD (coronary artery disease)    Chronic, stable.  No recent NTG use.  Continue collaboration with cardiology.      Aortic atherosclerosis (HCC)    Noted on CT 03/01/2019 -- continue statin daily for prevention.        Digestive   GERD (gastroesophageal reflux disease)    Chronic, stable on daily Nexium.  Continue current regimen and adjust as needed.  Mag level next visit.        Other   Gout    Chronic, stable on Allopurinol with recent kidney function and uric acid level stable.  Continue current regimen and adjust as needed.  No recent flares.      Hyperlipemia    Chronic, ongoing.  Continue current medication regimen and adjust as needed.  Lipid panel today.      Relevant Orders   Comprehensive metabolic panel   Lipid Panel w/o Chol/HDL Ratio   S/P CABG x 4    Followed by cardiology, continue this collaboration.          Follow up plan: Return in about 6 months (around 09/28/2020) for Annual physical and meet Dr. Charlotta Newton -- previous [facility] patient.

## 2020-03-28 NOTE — Assessment & Plan Note (Signed)
Chronic, ongoing with BP below goal for age today.  Recommend he continue to monitor BP at home daily + document and bring to provider visits.  Continue current medication regimen at this time and adjust as needed. Patient is scheduled for follow-up with them next week with cardiology.  Check CMP today.  Focus on DASH diet at home.  Return in 6 months.  Refills sent in as needed.

## 2020-03-28 NOTE — Assessment & Plan Note (Signed)
Chronic, followed by cardiology.  On BB, but no anticoagulant at this time.  Continue current medication regimen as recommended by them.   

## 2020-03-28 NOTE — Assessment & Plan Note (Signed)
Noted on CT 03/01/2019 -- continue statin daily for prevention.

## 2020-03-28 NOTE — Assessment & Plan Note (Signed)
Chronic, stable on Allopurinol with recent kidney function and uric acid level stable.  Continue current regimen and adjust as needed.  No recent flares.

## 2020-03-28 NOTE — Assessment & Plan Note (Signed)
Chronic, ongoing.  Continue current medication regimen and adjust as needed. Lipid panel today. 

## 2020-03-28 NOTE — Assessment & Plan Note (Signed)
Chronic, stable on daily Nexium.  Continue current regimen and adjust as needed.  Mag level next visit.

## 2020-03-28 NOTE — Patient Instructions (Signed)

## 2020-03-29 LAB — LIPID PANEL W/O CHOL/HDL RATIO
Cholesterol, Total: 127 mg/dL (ref 100–199)
HDL: 45 mg/dL (ref >39)
LDL Chol Calc (NIH): 62 mg/dL (ref 0–99)
Triglycerides: 108 mg/dL (ref 0–149)
VLDL Cholesterol Cal: 20 mg/dL (ref 5–40)

## 2020-03-29 LAB — COMPREHENSIVE METABOLIC PANEL
ALT: 30 IU/L (ref 0–44)
AST: 30 IU/L (ref 0–40)
Albumin/Globulin Ratio: 2.5 — ABNORMAL HIGH (ref 1.2–2.2)
Albumin: 5 g/dL — ABNORMAL HIGH (ref 3.8–4.8)
Alkaline Phosphatase: 100 IU/L (ref 44–121)
BUN/Creatinine Ratio: 14 (ref 10–24)
BUN: 14 mg/dL (ref 8–27)
Bilirubin Total: 1 mg/dL (ref 0.0–1.2)
CO2: 21 mmol/L (ref 20–29)
Calcium: 9.6 mg/dL (ref 8.6–10.2)
Chloride: 104 mmol/L (ref 96–106)
Creatinine, Ser: 1.02 mg/dL (ref 0.76–1.27)
Globulin, Total: 2 g/dL (ref 1.5–4.5)
Glucose: 82 mg/dL (ref 65–99)
Potassium: 4 mmol/L (ref 3.5–5.2)
Sodium: 143 mmol/L (ref 134–144)
Total Protein: 7 g/dL (ref 6.0–8.5)
eGFR: 79 mL/min/{1.73_m2} (ref >59)

## 2020-03-29 NOTE — Progress Notes (Signed)
Contacted via MyChart   Good evening Thomas Miller, your labs have returned.  Kidney and liver function remain stable.  Cholesterol levels are at goal.  Continue all current medications.  You are doing fantastic!! Keep being awesome!!  Thank you for allowing me to participate in your care. Kindest regards, Jolene

## 2020-04-03 DIAGNOSIS — Z87442 Personal history of urinary calculi: Secondary | ICD-10-CM | POA: Insufficient documentation

## 2020-04-03 DIAGNOSIS — N200 Calculus of kidney: Secondary | ICD-10-CM | POA: Insufficient documentation

## 2020-04-03 DIAGNOSIS — H9190 Unspecified hearing loss, unspecified ear: Secondary | ICD-10-CM | POA: Insufficient documentation

## 2020-04-04 ENCOUNTER — Encounter: Payer: Self-pay | Admitting: Cardiology

## 2020-04-04 ENCOUNTER — Other Ambulatory Visit: Payer: Self-pay

## 2020-04-04 ENCOUNTER — Ambulatory Visit: Payer: Medicare Other | Admitting: Cardiology

## 2020-04-04 VITALS — BP 134/72 | HR 56 | Ht 69.0 in | Wt 211.8 lb

## 2020-04-04 DIAGNOSIS — E782 Mixed hyperlipidemia: Secondary | ICD-10-CM | POA: Diagnosis not present

## 2020-04-04 DIAGNOSIS — Z951 Presence of aortocoronary bypass graft: Secondary | ICD-10-CM | POA: Diagnosis not present

## 2020-04-04 DIAGNOSIS — I2510 Atherosclerotic heart disease of native coronary artery without angina pectoris: Secondary | ICD-10-CM | POA: Diagnosis not present

## 2020-04-04 DIAGNOSIS — I10 Essential (primary) hypertension: Secondary | ICD-10-CM

## 2020-04-04 MED ORDER — RANOLAZINE ER 500 MG PO TB12: 500.0000 mg | ORAL_TABLET | Freq: Two times a day (BID) | ORAL | 14 refills | Status: DC

## 2020-04-04 MED ORDER — RANOLAZINE ER 1000 MG PO TB12: 1000.0000 mg | ORAL_TABLET | Freq: Two times a day (BID) | ORAL | 3 refills | Status: DC

## 2020-04-04 MED ORDER — NITROGLYCERIN 0.4 MG SL SUBL: 0.4000 mg | SUBLINGUAL_TABLET | SUBLINGUAL | 6 refills | Status: DC | PRN

## 2020-04-04 NOTE — Patient Instructions (Signed)
Medication Instructions:  Your physician has recommended you make the following change in your medication:   Take 500 mg twice daily for 2 weeks then increase to 1000 mg twice daily. You will have 2 prescriptions for this medication.  *If you need a refill on your cardiac medications before your next appointment, please call your pharmacy*   Lab Work: None ordered If you have labs (blood work) drawn today and your tests are completely normal, you will receive your results only by: [name] MyChart Message (if you have MyChart) OR . A paper copy in the mail If you have any lab test that is abnormal or we need to change your treatment, we will call you to review the results.   Testing/Procedures: None ordered   Follow-Up: At [facility], you and your health needs are our priority.  As part of our continuing mission to provide you with exceptional heart care, we have created designated Provider Care Teams.  These Care Teams include your primary Cardiologist (physician) and Advanced Practice Providers (APPs -  Physician Assistants and Nurse Practitioners) who all work together to provide you with the care you need, when you need it.  We recommend signing up for the patient portal called "MyChart".  Sign up information is provided on this After Visit Summary.  MyChart is used to connect with patients for Virtual Visits (Telemedicine).  Patients are able to view lab/test results, encounter notes, upcoming appointments, etc.  Non-urgent messages can be sent to your provider as well.   To learn more about what you can do with MyChart, go to [email or web address].    Your next appointment:   3 month(s)  The format for your next appointment:   In Person  Provider:   Belva Crome, MD   Other Instructions Ranolazine tablets, extended release What is this medicine? RANOLAZINE (ra NOE la zeen) is a heart medicine. It is used to treat chronic chest pain (angina). This medicine must be taken  regularly. It will not relieve an acute episode of chest pain. This medicine may be used for other purposes; ask your health care provider or pharmacist if you have questions. COMMON BRAND NAME(S): Ranexa What should I tell my health care provider before I take this medicine? They need to know if you have any of these conditions:  heart disease  irregular heartbeat  kidney disease  liver disease  low levels of potassium or magnesium in the blood  an unusual or allergic reaction to ranolazine, other medicines, foods, dyes, or preservatives  pregnant or trying to get pregnant  breast-feeding How should I use this medicine? Take this medicine by mouth with a glass of water. Follow the directions on the prescription label. Do not cut, crush, or chew this medicine. Take with or without food. Do not take this medication with grapefruit juice. Take your doses at regular intervals. Do not take your medicine more often then directed. Talk to your pediatrician regarding the use of this medicine in children. Special care may be needed. Overdosage: If you think you have taken too much of this medicine contact a poison control center or emergency room at once. NOTE: This medicine is only for you. Do not share this medicine with others. What if I miss a dose? If you miss a dose, take it as soon as you can. If it is almost time for your next dose, take only that dose. Do not take double or extra doses. What may interact with this medicine? Do not take  this medicine with any of the following medications:  antivirals for HIV or AIDS  cerivastatin  certain antibiotics like chloramphenicol, clarithromycin, dalfopristin; quinupristin, isoniazid, rifabutin, rifampin, rifapentine  certain medicines used for cancer like imatinib, nilotinib  certain medicines for fungal infections like fluconazole, itraconazole, ketoconazole, posaconazole, voriconazole  certain medicines for irregular heart beat  like dronedarone  certain medicines for seizures like carbamazepine, fosphenytoin, oxcarbazepine, phenobarbital, phenytoin  cisapride  conivaptan  cyclosporine  grapefruit or grapefruit juice  lumacaftor; ivacaftor  nefazodone  pimozide  quinacrine  St John's wort  thioridazine This medicine may also interact with the following medications:  alfuzosin  certain medicines for depression, anxiety, or psychotic disturbances like bupropion, citalopram, fluoxetine, fluphenazine, paroxetine, perphenazine, risperidone, sertraline, trifluoperazine  certain medicines for cholesterol like atorvastatin, lovastatin, simvastatin  certain medicines for stomach problems like octreotide, palonosetron, prochlorperazine  eplerenone  ergot alkaloids like dihydroergotamine, ergonovine, ergotamine, methylergonovine  metformin  nicardipine  other medicines that prolong the QT interval (cause an abnormal heart rhythm) like dofetilide, ziprasidone  sirolimus  tacrolimus This list may not describe all possible interactions. Give your health care provider a list of all the medicines, herbs, non-prescription drugs, or dietary supplements you use. Also tell them if you smoke, drink alcohol, or use illegal drugs. Some items may interact with your medicine. What should I watch for while using this medicine? Visit your doctor for regular check ups. Tell your doctor or healthcare professional if your symptoms do not start to get better or if they get worse. This medicine will not relieve an acute attack of angina or chest pain. This medicine can change your heart rhythm. Your health care provider may check your heart rhythm by ordering an electrocardiogram (ECG) while you are taking this medicine. You may get drowsy or dizzy. Do not drive, use machinery, or do anything that needs mental alertness until you know how this medicine affects you. Do not stand or sit up quickly, especially if you are an  older patient. This reduces the risk of dizzy or fainting spells. Alcohol may interfere with the effect of this medicine. Avoid alcoholic drinks. If you are scheduled for any medical or dental procedure, tell your healthcare provider that you are taking this medicine. This medicine can interact with other medicines used during surgery. What side effects may I notice from receiving this medicine? Side effects that you should report to your doctor or health care professional as soon as possible:  allergic reactions like skin rash, itching or hives, swelling of the face, lips, or tongue  breathing problems  changes in vision  fast, irregular or pounding heartbeat  feeling faint or lightheaded, falls  low or high blood pressure  numbness or tingling feelings  ringing in the ears  tremor or shakiness  slow heartbeat (fewer than 50 beats per minute)  swelling of the legs or feet Side effects that usually do not require medical attention (report to your doctor or health care professional if they continue or are bothersome):  constipation  drowsy  dry mouth  headache  nausea or vomiting  stomach upset This list may not describe all possible side effects. Call your doctor for medical advice about side effects. You may report side effects to FDA at 1-800-FDA-1088. Where should I keep my medicine? Keep out of the reach of children. Store at room temperature between 15 and 30 degrees C (59 and 86 degrees F). Throw away any unused medicine after the expiration date. NOTE: This sheet is a summary.  It may not cover all possible information. If you have questions about this medicine, talk to your doctor, pharmacist, or health care provider.  2021 [employer]vier/Gold Standard (2018-01-05 09:18:49)

## 2020-04-04 NOTE — Progress Notes (Signed)
Cardiology Office Note:    Date:  04/04/2020   ID:  Thomas Miller, DOB 1950-03-25, MRN [id number]  PCP:  Loura Pardon, MD  Cardiologist:  Garwin Brothers, MD   Referring MD: Steele Sizer, MD    ASSESSMENT:    1. Coronary artery disease involving native coronary artery of native heart without angina pectoris   2. Mixed hyperlipidemia   3. Primary hypertension   4. S/P CABG x 4    PLAN:    In order of problems listed above:  1. Coronary artery disease: Stable angina pectoris: Patient is very well ambulatory.  He uses the bicycle at home and walks.  He also golfs.  With about 04-4998 steps every day he denies any chest pain orthopnea or PND.  His stress test was mildly abnormal and a low risk scan and he wanted medical therapy.  I also discussed Ranexa with him during the visit today and he is fine with the will initiate him with 500 mg twice daily for 2 weeks and then a gram twice daily.  He is agreeable.  He will be seen in follow-up appointment in 3 months or earlier if he has any concerns.  Sublingual nitroglycerin prescription was sent, its protocol and 911 protocol explained and the patient vocalized understanding questions were answered to the patient's satisfaction 2. Essential hypertension: Blood pressure stable and diet was emphasized.  Lifestyle modification urged. 3. Mixed dyslipidemia: I congratulated him on his lipid profile.  He is doing well with diet and exercise.  He is very meticulous about exercise and denies any symptoms with this. 4. Patient will be seen in follow-up appointment in 3 months or earlier if the patient has any concerns    Medication Adjustments/Labs and Tests Ordered: Current medicines are reviewed at length with the patient today.  Concerns regarding medicines are outlined above.  No orders of the defined types were placed in this encounter.  No orders of the defined types were placed in this encounter.    No chief complaint on file.     History of Present Illness:    Thomas Miller is a 70 y.o. male.  Patient has past medical history of coronary artery disease post CABG surgery, essential hypertension, dyslipidemia and history of stroke.  He denies any problems at this time and takes care of activities of daily living.  No chest pain orthopnea or PND.  At the time of my evaluation, the patient is alert awake oriented and in no distress.  Past Medical History:  Diagnosis Date  . Advanced care planning/counseling discussion 11/27/2016  . Aortic atherosclerosis (HCC) 03/24/2020   Noted on CT 03/01/19  . BPH (benign prostatic hyperplasia) 10/25/2014  . CAD (coronary artery disease) 08/07/2017  . GERD (gastroesophageal reflux disease) 10/25/2014  . Gout   . Hearing loss   . History of 2019 novel coronavirus disease (COVID-19) 03/24/2020   On 02/09/20  . History of stroke 10/05/2019  . Hyperlipemia 10/25/2014  . Hypertension   . Insomnia 05/19/2016  . Kidney stones   . PAF (paroxysmal atrial fibrillation) (HCC) 08/07/2017  . Reflux   . 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  . S/P epidural steroid injection 05/2018    Past Surgical History:  Procedure Laterality Date  . BACK SURGERY    . CORONARY ARTERY BYPASS GRAFT N/A 07/20/2017   Procedure: CORONARY ARTERY BYPASS GRAFTING (CABG) times four using left internal mammary artery to LAD and endoscopically harvested right saphenous vein graft to distal  circumflex, intermediate, and PD;  Surgeon: [name], MD;  Location: [facility] OR;  Service: Open Heart Surgery;  Laterality: N/A;  . EPIDURAL BLOCK INJECTION  05/2018   L3injections   . EYE SURGERY Bilateral 2013  . FOOT SURGERY Bilateral    ingrown toenails  . GALLBLADDER SURGERY    . LEFT HEART CATH AND CORONARY ANGIOGRAPHY N/A 07/17/2017   Procedure: LEFT HEART CATH AND CORONARY ANGIOGRAPHY;  Surgeon: Yvonne Kendall, MD;  Location: [facility] INVASIVE CV LAB;  Service: Cardiovascular;  Laterality: N/A;  . SHOULDER SURGERY Right   . SPINE SURGERY       Current Medications: Current Meds  Medication Sig  . allopurinol (ZYLOPRIM) 300 MG tablet Take 1 tablet (300 mg total) by mouth daily.  [name] Constante Cider Vinegar 600 MG CAPS Take 600 mg by mouth daily.  [name] aspirin EC 81 MG tablet Take 2 tablets (162 mg total) by mouth daily.  [name] atorvastatin (LIPITOR) 80 MG tablet Take 1 tablet (80 mg total) by mouth daily at 6 PM.  . esomeprazole (NEXIUM) 40 MG capsule Take 1 capsule (40 mg total) by mouth daily before breakfast.  . Liniments (DEEP BLUE RELIEF) GEL Apply 1 application topically as needed (joints).  . losartan (COZAAR) 25 MG tablet Take 1 tablet (25 mg total) by mouth daily.  . metoprolol tartrate (LOPRESSOR) 25 MG tablet Take 1 tablet (25 mg total) by mouth 2 (two) times daily.  . nitroGLYCERIN (NITROSTAT) 0.4 MG SL tablet Place 0.4 mg under the tongue every 5 (five) minutes as needed for chest pain.  [name] VITAMIN D, CHOLECALCIFEROL, PO Take 2,000 mg by mouth daily.     Allergies:   Doxycycline, Ace inhibitors, Codeine, and Naprosyn [naproxen]   Social History   Socioeconomic History  . Marital status: Married    Spouse name: Not on file  . Number of children: Not on file  . Years of education: Not on file  . Highest education level: Associate degree: occupational, Scientist, product/process development, or vocational program  Occupational History  . Occupation: retired  Tobacco Use  . Smoking status: Never Smoker  . Smokeless tobacco: Never Used  Vaping Use  . Vaping Use: Never used  Substance and Sexual Activity  . Alcohol use: Not Currently  . Drug use: No  . Sexual activity: Not on file  Other Topics Concern  . Not on file  Social History Narrative   Volunteers and golfs    Social Determinants of Health   Financial Resource Strain: Low Risk   . Difficulty of Paying Living Expenses: Not hard at all  Food Insecurity: No Food Insecurity  . Worried About Programme researcher, broadcasting/film/video in the Last Year: Never true  . Ran Out of Food in the Last Year: Never true   Transportation Needs: No Transportation Needs  . Lack of Transportation (Medical): No  . Lack of Transportation (Non-Medical): No  Physical Activity: Sufficiently Active  . Days of Exercise per Week: 6 days  . Minutes of Exercise per Session: 30 min  Stress: No Stress Concern Present  . Feeling of Stress : Not at all  Social Connections: Socially Integrated  . Frequency of Communication with Friends and Family: More than three times a week  . Frequency of Social Gatherings with Friends and Family: More than three times a week  . Attends Religious Services: More than 4 times per year  . Active Member of Clubs or Organizations: Yes  . Attends Banker Meetings: More than 4  times per year  . Marital Status: Married     Family History: The patient's family history includes Diabetes in his mother; Lung cancer in his father; Stroke in his paternal grandfather.  ROS:   Please see the history of present illness.    All other systems reviewed and are negative.  EKGs/Labs/Other Studies Reviewed:    The following studies were reviewed today: EKG reveals sinus rhythm and nonspecific ST-T changes   Recent Labs: 08/28/2019: Hemoglobin 16.1; Platelets 198 09/28/2019: Magnesium 2.1; TSH 1.960 03/28/2020: ALT 30; BUN 14; Creatinine, Ser 1.02; Potassium 4.0; Sodium 143  Recent Lipid Panel    Component Value Date/Time   CHOL 127 03/28/2020 0907   CHOL 142 06/03/2017 0811   TRIG 108 03/28/2020 0907   TRIG 123 06/03/2017 0811   HDL 45 03/28/2020 0907   CHOLHDL 3.8 09/27/2018 0903   CHOLHDL 3.0 07/18/2017 0249   VLDL 10 07/18/2017 0249   VLDL 25 06/03/2017 0811   LDLCALC 62 03/28/2020 0907    Physical Exam:    VS:  BP 134/72   Pulse (!) 56   Ht 5\' 9"  (1.753 m)   Wt 211 lb 12.8 oz (96.1 kg)   SpO2 97%   BMI 31.28 kg/m     Wt Readings from Last 3 Encounters:  04/04/20 211 lb 12.8 oz (96.1 kg)  03/28/20 209 lb 9.6 oz (95.1 kg)  02/09/20 201 lb (91.2 kg)     GEN:  Patient is in no acute distress HEENT: Normal NECK: No JVD; No carotid bruits LYMPHATICS: No lymphadenopathy CARDIAC: Hear sounds regular, 2/6 systolic murmur at the apex. RESPIRATORY:  Clear to auscultation without rales, wheezing or rhonchi  ABDOMEN: Soft, non-tender, non-distended MUSCULOSKELETAL:  No edema; No deformity  SKIN: Warm and dry NEUROLOGIC:  Alert and oriented x 3 PSYCHIATRIC:  Normal affect   Signed, 02/11/20, MD  04/04/2020 8:36 AM    [facility]

## 2020-04-19 ENCOUNTER — Other Ambulatory Visit: Payer: Self-pay

## 2020-04-19 DIAGNOSIS — K219 Gastro-esophageal reflux disease without esophagitis: Secondary | ICD-10-CM

## 2020-04-19 MED ORDER — ESOMEPRAZOLE MAGNESIUM 40 MG PO CPDR: 40.0000 mg | DELAYED_RELEASE_CAPSULE | Freq: Every day | ORAL | 1 refills | Status: DC

## 2020-04-19 NOTE — Telephone Encounter (Signed)
Refill request for Esomeprazole mag DR 40 mg  90 caps QD before breakfast.   LOV 03/28/20   Up coming visit is 09/28/20

## 2020-05-09 ENCOUNTER — Telehealth: Payer: Medicare Other | Admitting: General Practice

## 2020-05-09 ENCOUNTER — Ambulatory Visit (INDEPENDENT_AMBULATORY_CARE_PROVIDER_SITE_OTHER): Payer: Medicare Other | Admitting: General Practice

## 2020-05-09 DIAGNOSIS — I2510 Atherosclerotic heart disease of native coronary artery without angina pectoris: Secondary | ICD-10-CM

## 2020-05-09 DIAGNOSIS — E782 Mixed hyperlipidemia: Secondary | ICD-10-CM

## 2020-05-09 DIAGNOSIS — I10 Essential (primary) hypertension: Secondary | ICD-10-CM | POA: Diagnosis not present

## 2020-05-09 NOTE — Chronic Care Management (AMB) (Signed)
Chronic Care Management   CCM RN Visit Note  05/09/2020 Name: Thomas Miller MRN: [id number] DOB: 10/13/1950  Subjective: Thomas Miller is a 70 y.o. year old male who is a primary care patient of Vigg, Avanti, MD. The care management team was consulted for assistance with disease management and care coordination needs.    Engaged with patient by telephone for follow up visit in response to provider referral for case management and/or care coordination services.   Consent to Services:  The patient was given information about Chronic Care Management services, agreed to services, and gave verbal consent prior to initiation of services.  Please see initial visit note for detailed documentation.   Patient agreed to services and verbal consent obtained.   Assessment: Review of patient past medical history, allergies, medications, health status, including review of consultants reports, laboratory and other test data, was performed as part of comprehensive evaluation and provision of chronic care management services.   SDOH (Social Determinants of Health) assessments and interventions performed:    CCM Care Plan  Allergies  Allergen Reactions  . Doxycycline Rash  . Ace Inhibitors Cough    "cough"  . Codeine Other (See Comments)    Insomnia  . Naprosyn [Naproxen] Rash    Outpatient Encounter Medications as of 05/09/2020  Medication Sig  . allopurinol (ZYLOPRIM) 300 MG tablet Take 1 tablet (300 mg total) by mouth daily.  . Bines Cider Vinegar 600 MG CAPS Take 600 mg by mouth daily.  . aspirin EC 81 MG tablet Take 2 tablets (162 mg total) by mouth daily.  . atorvastatin (LIPITOR) 80 MG tablet Take 1 tablet (80 mg total) by mouth daily at 6 PM.  . esomeprazole (NEXIUM) 40 MG capsule Take 1 capsule (40 mg total) by mouth daily before breakfast.  . Liniments (DEEP BLUE RELIEF) GEL Apply 1 application topically as needed (joints).  . losartan (COZAAR) 25 MG tablet Take 1 tablet (25 mg total)  by mouth daily.  . metoprolol tartrate (LOPRESSOR) 25 MG tablet Take 1 tablet (25 mg total) by mouth 2 (two) times daily.  . nitroGLYCERIN (NITROSTAT) 0.4 MG SL tablet Place 1 tablet (0.4 mg total) under the tongue every 5 (five) minutes as needed for chest pain.  . ranolazine (RANEXA) 1000 MG SR tablet Take 1 tablet (1,000 mg total) by mouth 2 (two) times daily.  . ranolazine (RANEXA) 500 MG 12 hr tablet Take 1 tablet (500 mg total) by mouth 2 (two) times daily.  . VITAMIN D, CHOLECALCIFEROL, PO Take 2,000 mg by mouth daily.   No facility-administered encounter medications on file as of 05/09/2020.    Patient Active Problem List   Diagnosis Date Noted  . Hearing loss   . Kidney stones   . Aortic atherosclerosis (HCC) 03/24/2020  . History of 2019 novel coronavirus disease (COVID-19) 03/24/2020  . History of stroke 10/05/2019  . S/P epidural steroid injection 05/2018  . PAF (paroxysmal atrial fibrillation) (HCC) 08/07/2017  . CAD (coronary artery disease) 08/07/2017  . 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  . Advanced care planning/counseling discussion 11/27/2016  . Insomnia 05/19/2016  . Hypertension 10/25/2014  . Gout 10/25/2014  . GERD (gastroesophageal reflux disease) 10/25/2014  . Hyperlipemia 10/25/2014  . BPH (benign prostatic hyperplasia) 10/25/2014    Conditions to be addressed/monitored:CAD, HTN and HLD  Care Plan : RNCM: Coronary Artery Disease (Adult) and HLD  Updates made by Tate, Pamela J since 05/09/2020 12:00 AM    Problem: RNCM: Disease Progression (Coronary   Artery Disease)   Priority: Medium    Goal: RNCM: Disease Progression Prevented or Minimized   Priority: Medium  Note:   Current Barriers:  . Poorly controlled hyperlipidemia, complicated by caregiver stress, recent diagnosis of COVID 19 in January 2022 . Current antihyperlipidemic regimen: Lipitor 80 mg QD . Most recent lipid panel:  . Lab Results .  Component . Value . Date .   [name] CHOL . 127 . 03/28/2020 .    [name] CHOL . 130 . 09/28/2019 .   [name] CHOL . 124 . 03/28/2019 .   [name] Lab Results .  Component . Value . Date .   [name] HDL . 45 . 03/28/2020 .   [name] HDL . 40 . 09/28/2019 .   [name] HDL . 42 . 03/28/2019 .   [name] Lab Results .  Component . Value . Date .   [name] [place] . 62 . 03/28/2020 .   [name] [place] . 65 . 09/28/2019 .   [name] [place] . 55 . 03/28/2019 .   [name] Lab Results .  Component . Value . Date .   [name] TRIG . 108 . 03/28/2020 .   [name] TRIG . 142 . 09/28/2019 .   [name] TRIG . 161 (H) . 03/28/2019 .   [name] Lab Results .  Component . Value . Date .   [name] CHOLHDL . 3.8 . 09/27/2018 .   [name] CHOLHDL . 3.2 . 12/16/2017 .   [name] CHOLHDL . 3.0 . 07/18/2017 .   [name] No results found for: LDLDIRECT  . ASCVD risk enhancing conditions: age >38, CAD,  HTN,  History of stroke  . Does not contact provider office for questions/concerns  RN Care Manager Clinical Goal(s):  [name] Over the next 120 days, patient will work with Consulting civil engineer, providers, and care team towards execution of optimized self-health management plan . Over the next 120 days, patient will verbalize understanding of plan for effective management of HLD and CAD . Over the next 120 days, patient will work with [facility] and pcp  to address needs related to effective management of CAD and HLD  . Over the next 120 days, patient will attend all scheduled medical appointments: next is 03-28-2020 at 0840 am . Over the next 120 days, patient will demonstrate improved adherence to prescribed treatment plan for CAD and HLD  as evidenced bylabs WNL, following a heart healthy diet, maintaining weight, and working with the CCM team to optimize health and well being   Interventions: . Collaboration with Charlynne Cousins, MD regarding development and update of comprehensive plan of care as evidenced by provider attestation and co-signature . Inter-disciplinary care team collaboration (see longitudinal plan of care) . Medication review performed; medication list updated in electronic medical  record.  Bertram Savin care team collaboration (see longitudinal plan of care) . Referred to pharmacy team for assistance with HLD and CAD medication management . Evaluation of current treatment plan related to HLD and CAD and patient's adherence to plan as established by provider. 05-09-2020: The patient saw the cardiologist and pcp recently. Changes made in medications. See HTN care plan also.  . Advised patient to call the office for changes in condition or questions. Also to alert the pcp for worsening sx/sx related to recent COVID 19 diagnosis in January 2022.  The patient verbalized today that he is at 90% better. Still experiencing some cough, productive at times- denies any sx and sx of infection at this time.  . Provided education to  patient re: sx and sx of infection, following a heart healthy diet, taking medications as directed . Reviewed medications with patient and discussed compliance, no issues noted at this time . Reviewed scheduled/upcoming provider appointments including: 09-28-2020 . Discussed plans with patient for ongoing care management follow up and provided patient with direct contact information for care management team   Patient Goals/Self-Care Activities: . Over the next 120 days, patient will:   - call for medicine refill 2 or 3 days before it runs out - call if I am sick and can't take my medicine - keep a list of all the medicines I take; vitamins and herbals too - learn to read medicine labels - use a pillbox to sort medicine - use an alarm clock or phone to remind me to take my medicine - change to whole grain breads, cereal, pasta - drink 6 to 8 glasses of water each day - eat 3 to 5 servings of fruits and vegetables each day - eat 5 or 6 small meals each day - fill half the plate with nonstarchy vegetables - limit fast food meals to no more than 1 per week - manage portion size - prepare main meal at home 3 to 5 days each week - read food labels for  fat, fiber, carbohydrates and portion size - be open to making changes - I can manage, know and watch for signs of a heart attack - if I have chest pain, call for help - learn about small changes that will make a big difference - learn my personal risk factors  - barriers to treatment adherence reviewed and addressed - functional limitation screening reviewed - healthy lifestyle promoted - medication-adherence assessment completed - medication side effects managed - rescue (action) plan developed - response to pharmacologic therapy monitored - self-awareness of signs/symptoms of worsening disease encouraged  Follow Up Plan: Telephone follow up appointment with care management team member scheduled for: 07-25-2020 at 0945 am     Care Plan : RNCM: Hypertension (Adult)  Updates made by Vanita Ingles since 05/09/2020 12:00 AM    Problem: RNCM: Hypertension (Hypertension)   Priority: Medium    Long-Range Goal: RNCM: Hypertension Monitored   Priority: Medium  Note:   Objective:  . Last practice recorded BP readings:  . BP Readings from Last 3 Encounters: .  04/04/20 . 134/72 .  03/28/20 . 125/85 .  02/09/20 . (!) 141/81 .    [name] Most recent eGFR/CrCl: No results found for: EGFR  No components found for: CRCL Current Barriers:  [name] Knowledge Deficits related to basic understanding of hypertension pathophysiology and self care management . Limited Social Support . Lacks social connections . Does not contact provider office for questions/concerns Case Manager Clinical Goal(s):  [name] Over the next 120 days, patient will verbalize understanding of plan for hypertension management . Over the next 120 days, patient will attend all scheduled medical appointments: 03-28-2020 at 840 am . Over the next 120 days, patient will demonstrate improved adherence to prescribed treatment plan for hypertension as evidenced by taking all medications as prescribed, monitoring and recording blood pressure as  directed, adhering to low sodium/DASH diet . Over the next 120 days, patient will demonstrate improved health management independence as evidenced by checking blood pressure as directed and notifying PCP if SBP>160 or DBP > 90, taking all medications as prescribe, and adhering to a low sodium diet as discussed. . Over the next 120 days, patient will verbalize basic understanding of hypertension  disease process and self health management plan as evidenced by compliance with medications, keeping appointments, maintain active lifestyle, work with CCM team to optimize health and well being.  Interventions:  . Collaboration with Vigg, Avanti, MD regarding development and update of comprehensive plan of care as evidenced by provider attestation and co-signature . Inter-disciplinary care team collaboration (see longitudinal plan of care) . Evaluation of current treatment plan related to hypertension self management and patient's adherence to plan as established by provider. 05-09-2020: The patient saw the pcp and cardiologist in May. The patient states that the cardiologist changed his medications and he is doing okay with the change but has noticed light headed and dizziness when he changes position. The patient denies any acute distress. Will discuss this with the cardiologist at his upcoming visit.  . Provided education to patient re: stroke prevention, s/s of heart attack and stroke, DASH diet, complications of uncontrolled blood pressure . Reviewed medications with patient and discussed importance of compliance. 05-09-2020: The patient is compliant with his medications and denies any new concerns.  . Discussed plans with patient for ongoing care management follow up and provided patient with direct contact information for care management team . Advised patient, providing education and rationale, to monitor blood pressure daily and record, calling PCP for findings outside established parameters. 05-09-2020: The  patient is monitoring his blood pressure. Has had medication changes and is experiencing sx/sx of orthostatic hypotension. The patient notices sometimes when he changes position that he is experiencing light headedness and dizziness. Education on safety given. Will continue to monitor.  . Reviewed scheduled/upcoming provider appointments including: 09-28-2020  . Advised the patient to write down questions for pcp for upcoming visit. The patient with recent COVID 19. Has a productive cough but denies any sx and sx of infection at this time. CP he has intermittently. Denies any acute findings from wearing hollister monitor. Will continue to monitor for changes. 05-09-2020: The patient states that he is still having some itching in his ear but his throat is much better. Denies any new concerns.  Patient Goals/Self-Care Activities . Over the next 120 days, patient will:  - Self administers medications as prescribed Attends all scheduled provider appointments Calls provider office for new concerns, questions, or BP outside discussed parameters Checks BP and records as discussed Follows a low sodium diet/DASH diet - blood pressure trends reviewed - depression screen reviewed - home or ambulatory blood pressure monitoring encouraged Follow Up Plan: Telephone follow up appointment with care management team member scheduled for: 07-25-2020 at 0945 am     Plan:Telephone follow up appointment with care management team member scheduled for:  07-25-2020 at 0945 am  Pam Tate RN, MSN, CCM Community Care Coordinator [place]  [facility] Mobile: [phone]         

## 2020-05-09 NOTE — Patient Instructions (Signed)
Visit Information  PATIENT GOALS: Patient Care Plan: RNCM: Coronary Artery Disease (Adult) and HLD    Problem Identified: RNCM: Disease Progression (Coronary Artery Disease)   Priority: Medium    Goal: RNCM: Disease Progression Prevented or Minimized   Priority: Medium  Note:   Current Barriers:  . Poorly controlled hyperlipidemia, complicated by caregiver stress, recent diagnosis of COVID 19 in January 2022 . Current antihyperlipidemic regimen: Lipitor 80 mg QD . Most recent lipid panel:  . Lab Results .  Component . Value . Date .   [name] CHOL . 127 . 03/28/2020 .   [name] CHOL . 130 . 09/28/2019 .   [name] CHOL . 124 . 03/28/2019 .   [name] Lab Results .  Component . Value . Date .   [name] HDL . 45 . 03/28/2020 .   [name] HDL . 40 . 09/28/2019 .   [name] HDL . 42 . 03/28/2019 .   [name] Lab Results .  Component . Value . Date .   [name] [place] . 62 . 03/28/2020 .   [name] [place] . 65 . 09/28/2019 .   [name] [place] . 55 . 03/28/2019 .   [name] Lab Results .  Component . Value . Date .   [name] TRIG . 108 . 03/28/2020 .   [name] TRIG . 142 . 09/28/2019 .   [name] TRIG . 161 (H) . 03/28/2019 .   [name] Lab Results .  Component . Value . Date .   [name] CHOLHDL . 3.8 . 09/27/2018 .   [name] CHOLHDL . 3.2 . 12/16/2017 .   [name] CHOLHDL . 3.0 . 07/18/2017 .   [name] No results found for: LDLDIRECT  . ASCVD risk enhancing conditions: age >36, CAD,  HTN,  History of stroke  . Does not contact provider office for questions/concerns  RN Care Manager Clinical Goal(s):  [name] Over the next 120 days, patient will work with Consulting civil engineer, providers, and care team towards execution of optimized self-health management plan . Over the next 120 days, patient will verbalize understanding of plan for effective management of HLD and CAD . Over the next 120 days, patient will work with [facility] and pcp  to address needs related to effective management of CAD and HLD  . Over the next 120 days, patient will attend all scheduled medical appointments: next is 03-28-2020 at 0840  am . Over the next 120 days, patient will demonstrate improved adherence to prescribed treatment plan for CAD and HLD  as evidenced bylabs WNL, following a heart healthy diet, maintaining weight, and working with the CCM team to optimize health and well being   Interventions: . Collaboration with Charlynne Cousins, MD regarding development and update of comprehensive plan of care as evidenced by provider attestation and co-signature . Inter-disciplinary care team collaboration (see longitudinal plan of care) . Medication review performed; medication list updated in electronic medical record.  Bertram Savin care team collaboration (see longitudinal plan of care) . Referred to pharmacy team for assistance with HLD and CAD medication management . Evaluation of current treatment plan related to HLD and CAD and patient's adherence to plan as established by provider. 05-09-2020: The patient saw the cardiologist and pcp recently. Changes made in medications. See HTN care plan also.  . Advised patient to call the office for changes in condition or questions. Also to alert the pcp for worsening sx/sx related to recent COVID 19 diagnosis in January 2022.  The patient verbalized today that he is at 90%  better. Still experiencing some cough, productive at times- denies any sx and sx of infection at this time.  . Provided education to patient re: sx and sx of infection, following a heart healthy diet, taking medications as directed . Reviewed medications with patient and discussed compliance, no issues noted at this time . Reviewed scheduled/upcoming provider appointments including: 09-28-2020 . Discussed plans with patient for ongoing care management follow up and provided patient with direct contact information for care management team   Patient Goals/Self-Care Activities: . Over the next 120 days, patient will:   - call for medicine refill 2 or 3 days before it runs out - call if I am sick and can't take my  medicine - keep a list of all the medicines I take; vitamins and herbals too - learn to read medicine labels - use a pillbox to sort medicine - use an alarm clock or phone to remind me to take my medicine - change to whole grain breads, cereal, pasta - drink 6 to 8 glasses of water each day - eat 3 to 5 servings of fruits and vegetables each day - eat 5 or 6 small meals each day - fill half the plate with nonstarchy vegetables - limit fast food meals to no more than 1 per week - manage portion size - prepare main meal at home 3 to 5 days each week - read food labels for fat, fiber, carbohydrates and portion size - be open to making changes - I can manage, know and watch for signs of a heart attack - if I have chest pain, call for help - learn about small changes that will make a big difference - learn my personal risk factors  - barriers to treatment adherence reviewed and addressed - functional limitation screening reviewed - healthy lifestyle promoted - medication-adherence assessment completed - medication side effects managed - rescue (action) plan developed - response to pharmacologic therapy monitored - self-awareness of signs/symptoms of worsening disease encouraged  Follow Up Plan: Telephone follow up appointment with care management team member scheduled for: 07-25-2020 at 0945 am     Task: RNCM: Alleviate Barriers to Coronary Artery Disease Therapy   Note:   Care Management Activities:    - barriers to treatment adherence reviewed and addressed - functional limitation screening reviewed - healthy lifestyle promoted - medication-adherence assessment completed - medication side effects managed - rescue (action) plan developed - response to pharmacologic therapy monitored - self-awareness of signs/symptoms of worsening disease encouraged       Patient Care Plan: RNCM: Hypertension (Adult)    Problem Identified: RNCM: Hypertension (Hypertension)   Priority:  Medium    Long-Range Goal: RNCM: Hypertension Monitored   Priority: Medium  Note:   Objective:  . Last practice recorded BP readings:  . BP Readings from Last 3 Encounters: .  04/04/20 . 134/72 .  03/28/20 . 125/85 .  02/09/20 . (!) 141/81 .    . Most recent eGFR/CrCl: No results found for: EGFR  No components found for: CRCL Current Barriers:  . Knowledge Deficits related to basic understanding of hypertension pathophysiology and self care management . Limited Social Support . Lacks social connections . Does not contact provider office for questions/concerns Case Manager Clinical Goal(s):  . Over the next 120 days, patient will verbalize understanding of plan for hypertension management . Over the next 120 days, patient will attend all scheduled medical appointments: 03-28-2020 at 840 am . Over the next 120 days, patient will demonstrate improved   adherence to prescribed treatment plan for hypertension as evidenced by taking all medications as prescribed, monitoring and recording blood pressure as directed, adhering to low sodium/DASH diet . Over the next 120 days, patient will demonstrate improved health management independence as evidenced by checking blood pressure as directed and notifying PCP if SBP>160 or DBP > 90, taking all medications as prescribe, and adhering to a low sodium diet as discussed. . Over the next 120 days, patient will verbalize basic understanding of hypertension disease process and self health management plan as evidenced by compliance with medications, keeping appointments, maintain active lifestyle, work with CCM team to optimize health and well being.  Interventions:  . Collaboration with Vigg, Avanti, MD regarding development and update of comprehensive plan of care as evidenced by provider attestation and co-signature . Inter-disciplinary care team collaboration (see longitudinal plan of care) . Evaluation of current treatment plan related to hypertension self  management and patient's adherence to plan as established by provider. 05-09-2020: The patient saw the pcp and cardiologist in May. The patient states that the cardiologist changed his medications and he is doing okay with the change but has noticed light headed and dizziness when he changes position. The patient denies any acute distress. Will discuss this with the cardiologist at his upcoming visit.  . Provided education to patient re: stroke prevention, s/s of heart attack and stroke, DASH diet, complications of uncontrolled blood pressure . Reviewed medications with patient and discussed importance of compliance. 05-09-2020: The patient is compliant with his medications and denies any new concerns.  . Discussed plans with patient for ongoing care management follow up and provided patient with direct contact information for care management team . Advised patient, providing education and rationale, to monitor blood pressure daily and record, calling PCP for findings outside established parameters. 05-09-2020: The patient is monitoring his blood pressure. Has had medication changes and is experiencing sx/sx of orthostatic hypotension. The patient notices sometimes when he changes position that he is experiencing light headedness and dizziness. Education on safety given. Will continue to monitor.  . Reviewed scheduled/upcoming provider appointments including: 09-28-2020  . Advised the patient to write down questions for pcp for upcoming visit. The patient with recent COVID 19. Has a productive cough but denies any sx and sx of infection at this time. CP he has intermittently. Denies any acute findings from wearing hollister monitor. Will continue to monitor for changes. 05-09-2020: The patient states that he is still having some itching in his ear but his throat is much better. Denies any new concerns.  Patient Goals/Self-Care Activities . Over the next 120 days, patient will:  - Self administers medications as  prescribed Attends all scheduled provider appointments Calls provider office for new concerns, questions, or BP outside discussed parameters Checks BP and records as discussed Follows a low sodium diet/DASH diet - blood pressure trends reviewed - depression screen reviewed - home or ambulatory blood pressure monitoring encouraged Follow Up Plan: Telephone follow up appointment with care management team member scheduled for: 07-25-2020 at 0945 am   Task: RNCM: Identify and Monitor Blood Pressure Elevation   Note:   Care Management Activities:    - blood pressure trends reviewed - depression screen reviewed - home or ambulatory blood pressure monitoring encouraged         Patient verbalizes understanding of instructions provided today and agrees to view in MyChart.   Telephone follow up appointment with care management team member scheduled for: 07-25-2020 at 0945 am    Pam Tate RN, MSN, CCM Community Care Coordinator [place]  [facility] Mobile: [phone]   

## 2020-05-23 ENCOUNTER — Telehealth: Payer: Self-pay | Admitting: Interventional Cardiology

## 2020-05-23 MED ORDER — RANOLAZINE ER 500 MG PO TB12: 500.0000 mg | ORAL_TABLET | Freq: Two times a day (BID) | ORAL | 2 refills | Status: DC

## 2020-05-23 NOTE — Telephone Encounter (Signed)
Advised pt to resume the 500 mg dose of Ranexa twice daily as he had no problems with previous dose.

## 2020-05-23 NOTE — Telephone Encounter (Signed)
    Pt c/o medication issue:  1. Name of Medication:   ranolazine (RANEXA) 1000 MG SR tablet    2. How are you currently taking this medication (dosage and times per day)? Take 1 tablet (1,000 mg total) by mouth 2 (two) times daily.  3. Are you having a reaction (difficulty breathing--STAT)?   4. What is your medication issue? Pt is having reaction with this medication, he said since Dr. Tomie China increased his Ranexa he get dizziness and constipation, he said every time he gets up he gets dizzy. He had a vertigo episode this morning. He said he will take his wife for and appt and will not be available for the next 45 mins to an hour.

## 2020-05-28 ENCOUNTER — Telehealth: Payer: Self-pay | Admitting: Cardiology

## 2020-05-28 NOTE — Telephone Encounter (Signed)
Pt c/o medication issue:  1. Name of Medication:  ranolazine (RANEXA) 500 MG 12 hr tablet  2. How are you currently taking this medication (dosage and times per day)? As written  3. Are you having a reaction (difficulty breathing--STAT)? Yes   4. What is your medication issue? Since taking medication, patient has experienced no bowel movement, hurting of joints, and other issues.

## 2020-05-28 NOTE — Telephone Encounter (Signed)
Spoke to the patient just now and let him know Dr. Kem Parkinson recommendation. He verbalizes understanding and thanks me for calling back.

## 2020-06-15 ENCOUNTER — Other Ambulatory Visit: Payer: Self-pay | Admitting: Nurse Practitioner

## 2020-06-15 DIAGNOSIS — M1000 Idiopathic gout, unspecified site: Secondary | ICD-10-CM

## 2020-06-15 DIAGNOSIS — E7800 Pure hypercholesterolemia, unspecified: Secondary | ICD-10-CM

## 2020-07-11 DIAGNOSIS — H2513 Age-related nuclear cataract, bilateral: Secondary | ICD-10-CM | POA: Diagnosis not present

## 2020-07-12 ENCOUNTER — Encounter: Payer: Self-pay | Admitting: Cardiology

## 2020-07-12 ENCOUNTER — Ambulatory Visit: Payer: Medicare Other | Admitting: Cardiology

## 2020-07-12 ENCOUNTER — Other Ambulatory Visit: Payer: Self-pay

## 2020-07-12 VITALS — BP 138/76 | HR 50 | Ht 68.0 in | Wt 207.8 lb

## 2020-07-12 DIAGNOSIS — E782 Mixed hyperlipidemia: Secondary | ICD-10-CM

## 2020-07-12 DIAGNOSIS — Z951 Presence of aortocoronary bypass graft: Secondary | ICD-10-CM

## 2020-07-12 DIAGNOSIS — I2510 Atherosclerotic heart disease of native coronary artery without angina pectoris: Secondary | ICD-10-CM

## 2020-07-12 DIAGNOSIS — I700 Atherosclerosis of aorta: Secondary | ICD-10-CM

## 2020-07-12 DIAGNOSIS — I10 Essential (primary) hypertension: Secondary | ICD-10-CM | POA: Diagnosis not present

## 2020-07-12 NOTE — Progress Notes (Signed)
Cardiology Office Note:    Date:  07/12/2020   ID:  Thomas Miller, DOB 1950-10-10, MRN [id number]  PCP:  Loura Pardon, MD  Cardiologist:  Garwin Brothers, MD   Referring MD: Loura Pardon, MD    ASSESSMENT:    1. Aortic atherosclerosis (HCC)   2. Coronary artery disease involving native coronary artery of native heart without angina pectoris   3. Mixed hyperlipidemia   4. Primary hypertension   5. S/P CABG x 4    PLAN:    In order of problems listed above:  Coronary artery disease: Secondary prevention stressed with the patient.  Importance of compliance with diet medication stressed any vocalized understanding.  He is angina is stable and he walks on a regular basis at least half an hour a day 5 days a week with no symptoms.  I congratulated him about this. Essential hypertension: Blood pressure stable and diet was emphasized.   Mixed dyslipidemia: Lipids were reviewed and they are fine and I congratulated him about this. Patient will be seen in follow-up appointment in 6 months or earlier if the patient has any concerns.  Patient was advised to lose weight and risks of obesity explained.   Medication Adjustments/Labs and Tests Ordered: Current medicines are reviewed at length with the patient today.  Concerns regarding medicines are outlined above.  No orders of the defined types were placed in this encounter.  No orders of the defined types were placed in this encounter.    No chief complaint on file.    History of Present Illness:    Thomas Miller is a 70 y.o. male.  Patient has past medical history of coronary artery disease, aortic atherosclerosis, essential hypertension and dyslipidemia.  He has history of stroke.  He denies any problems at this time and takes care of activities of daily living.  No chest pain orthopnea or PND.  At the time of my evaluation, the patient is alert awake oriented and in no distress.  Past Medical History:  Diagnosis Date   Advanced  care planning/counseling discussion 11/27/2016   Aortic atherosclerosis (HCC) 03/24/2020   Noted on CT 03/01/19   BPH (benign prostatic hyperplasia) 10/25/2014   CAD (coronary artery disease) 08/07/2017   GERD (gastroesophageal reflux disease) 10/25/2014   Gout    Hearing loss    History of 2019 novel coronavirus disease (COVID-19) 03/24/2020   On 02/09/20   History of stroke 10/05/2019   Hyperlipemia 10/25/2014   Hypertension    Insomnia 05/19/2016   Kidney stones    PAF (paroxysmal atrial fibrillation) (HCC) 08/07/2017   Reflux 10/25/2014   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   S/P epidural steroid injection 05/2018    Past Surgical History:  Procedure Laterality Date   BACK SURGERY     CORONARY ARTERY BYPASS GRAFT N/A 07/20/2017   Procedure: CORONARY ARTERY BYPASS GRAFTING (CABG) times four using left internal mammary artery to LAD and endoscopically harvested right saphenous vein graft to distal circumflex, intermediate, and PD;  Surgeon: [name], MD;  Location: [facility] OR;  Service: Open Heart Surgery;  Laterality: N/A;   EPIDURAL BLOCK INJECTION  05/2018   L3injections    EYE SURGERY Bilateral 2013   FOOT SURGERY Bilateral    ingrown toenails   GALLBLADDER SURGERY     LEFT HEART CATH AND CORONARY ANGIOGRAPHY N/A 07/17/2017   Procedure: LEFT HEART CATH AND CORONARY ANGIOGRAPHY;  Surgeon: Yvonne Kendall, MD;  Location: [facility] INVASIVE CV LAB;  Service:  Cardiovascular;  Laterality: N/A;   SHOULDER SURGERY Right    SPINE SURGERY      Current Medications: Current Meds  Medication Sig   allopurinol (ZYLOPRIM) 300 MG tablet Take 1 tablet (300 mg total) by mouth daily.   Poblano Cider Vinegar 600 MG CAPS Take 600 mg by mouth daily.   aspirin EC 81 MG tablet Take 2 tablets (162 mg total) by mouth daily.   atorvastatin (LIPITOR) 80 MG tablet Take 1 tablet (80 mg total) by mouth daily at 6 PM.   esomeprazole (NEXIUM) 40 MG capsule Take 1 capsule (40 mg total) by mouth daily before breakfast.    Liniments (DEEP BLUE RELIEF) GEL Apply 1 application topically as needed for pain (joints). In joints   losartan (COZAAR) 25 MG tablet Take 1 tablet (25 mg total) by mouth daily.   metoprolol tartrate (LOPRESSOR) 25 MG tablet Take 1 tablet (25 mg total) by mouth 2 (two) times daily.   nitroGLYCERIN (NITROSTAT) 0.4 MG SL tablet Place 1 tablet (0.4 mg total) under the tongue every 5 (five) minutes as needed for chest pain.   VITAMIN D, CHOLECALCIFEROL, PO Take 2,000 mg by mouth daily.     Allergies:   Doxycycline, Ace inhibitors, Codeine, and Naprosyn [naproxen]   Social History   Socioeconomic History   Marital status: Married    Spouse name: Not on file   Number of children: Not on file   Years of education: Not on file   Highest education level: Associate degree: occupational, Scientist, product/process development, or vocational program  Occupational History   Occupation: retired  Tobacco Use   Smoking status: Never   Smokeless tobacco: Never  Vaping Use   Vaping Use: Never used  Substance and Sexual Activity   Alcohol use: Not Currently   Drug use: No   Sexual activity: Not on file  Other Topics Concern   Not on file  Social History Narrative   Volunteers and golfs    Social Determinants of Health   Financial Resource Strain: Low Risk    Difficulty of Paying Living Expenses: Not hard at all  Food Insecurity: No Food Insecurity   Worried About Programme researcher, broadcasting/film/video in the Last Year: Never true   Barista in the Last Year: Never true  Transportation Needs: No Transportation Needs   Lack of Transportation (Medical): No   Lack of Transportation (Non-Medical): No  Physical Activity: Sufficiently Active   Days of Exercise per Week: 6 days   Minutes of Exercise per Session: 30 min  Stress: No Stress Concern Present   Feeling of Stress : Not at all  Social Connections: Not on file     Family History: The patient's family history includes Diabetes in his mother; Lung cancer in his father;  Stroke in his paternal grandfather.  ROS:   Please see the history of present illness.    All other systems reviewed and are negative.  EKGs/Labs/Other Studies Reviewed:    The following studies were reviewed today: I discussed my findings with the patient at length.   Recent Labs: 08/28/2019: Hemoglobin 16.1; Platelets 198 09/28/2019: Magnesium 2.1; TSH 1.960 03/28/2020: ALT 30; BUN 14; Creatinine, Ser 1.02; Potassium 4.0; Sodium 143  Recent Lipid Panel    Component Value Date/Time   CHOL 127 03/28/2020 0907   CHOL 142 06/03/2017 0811   TRIG 108 03/28/2020 0907   TRIG 123 06/03/2017 0811   HDL 45 03/28/2020 0907   CHOLHDL 3.8 09/27/2018 0903  CHOLHDL 3.0 07/18/2017 0249   VLDL 10 07/18/2017 0249   VLDL 25 06/03/2017 0811   LDLCALC 62 03/28/2020 0907    Physical Exam:    VS:  BP 138/76   Pulse (!) 50   Ht 5\' 8"  (1.727 m)   Wt 207 lb 12.8 oz (94.3 kg)   SpO2 97%   BMI 31.60 kg/m     Wt Readings from Last 3 Encounters:  07/12/20 207 lb 12.8 oz (94.3 kg)  04/04/20 211 lb 12.8 oz (96.1 kg)  03/28/20 209 lb 9.6 oz (95.1 kg)     GEN: Patient is in no acute distress HEENT: Normal NECK: No JVD; No carotid bruits LYMPHATICS: No lymphadenopathy CARDIAC: Hear sounds regular, 2/6 systolic murmur at the apex. RESPIRATORY:  Clear to auscultation without rales, wheezing or rhonchi  ABDOMEN: Soft, non-tender, non-distended MUSCULOSKELETAL:  No edema; No deformity  SKIN: Warm and dry NEUROLOGIC:  Alert and oriented x 3 PSYCHIATRIC:  Normal affect   Signed, 05/28/20, MD  07/12/2020 8:30 AM    [facility]

## 2020-07-12 NOTE — Patient Instructions (Signed)

## 2020-07-25 ENCOUNTER — Telehealth: Payer: Medicare Other | Admitting: General Practice

## 2020-07-25 ENCOUNTER — Ambulatory Visit (INDEPENDENT_AMBULATORY_CARE_PROVIDER_SITE_OTHER): Payer: Medicare Other

## 2020-07-25 DIAGNOSIS — E782 Mixed hyperlipidemia: Secondary | ICD-10-CM | POA: Diagnosis not present

## 2020-07-25 DIAGNOSIS — I2510 Atherosclerotic heart disease of native coronary artery without angina pectoris: Secondary | ICD-10-CM | POA: Diagnosis not present

## 2020-07-25 DIAGNOSIS — I10 Essential (primary) hypertension: Secondary | ICD-10-CM | POA: Diagnosis not present

## 2020-07-25 NOTE — Patient Instructions (Signed)
Visit Information  PATIENT GOALS:  Goals Addressed   None     Patient verbalizes understanding of instructions provided today and agrees to view in MyChart.   Telephone follow up appointment with care management team member scheduled for:  Virgina Norfolk RN, BSN Community Care Coordinator [place]  [facility] Mobile: [phone]

## 2020-07-25 NOTE — Chronic Care Management (AMB) (Signed)
Chronic Care Management   CCM RN Visit Note  07/25/2020 Name: NIKOS ANGLEMYER MRN: [id number] DOB: [birth year]  Subjective: Thomas Miller is a 70 y.o. year old male who is a primary care patient of Vigg, Avanti, MD. The care management team was consulted for assistance with disease management and care coordination needs.    Engaged with patient by telephone for follow up visit in response to provider referral for case management and/or care coordination services.   Consent to Services:  The patient was given information about Chronic Care Management services, agreed to services, and gave verbal consent prior to initiation of services.  Please see initial visit note for detailed documentation.   Patient agreed to services and verbal consent obtained.   Assessment: Review of patient past medical history, allergies, medications, health status, including review of consultants reports, laboratory and other test data, was performed as part of comprehensive evaluation and provision of chronic care management services.   SDOH (Social Determinants of Health) assessments and interventions performed:    CCM Care Plan  Allergies  Allergen Reactions   Doxycycline Rash   Ace Inhibitors Cough    "cough"   Codeine Other (See Comments)    Insomnia   Naprosyn [Naproxen] Rash    Outpatient Encounter Medications as of 07/25/2020  Medication Sig   allopurinol (ZYLOPRIM) 300 MG tablet Take 1 tablet (300 mg total) by mouth daily.   [place] Cider Vinegar 600 MG CAPS Take 600 mg by mouth daily.   aspirin EC 81 MG tablet Take 2 tablets (162 mg total) by mouth daily.   atorvastatin (LIPITOR) 80 MG tablet Take 1 tablet (80 mg total) by mouth daily at 6 PM.   esomeprazole (NEXIUM) 40 MG capsule Take 1 capsule (40 mg total) by mouth daily before breakfast.   Liniments (DEEP BLUE RELIEF) GEL Apply 1 application topically as needed for pain (joints). In joints   losartan (COZAAR) 25 MG tablet Take 1 tablet (25 mg  total) by mouth daily.   metoprolol tartrate (LOPRESSOR) 25 MG tablet Take 1 tablet (25 mg total) by mouth 2 (two) times daily.   nitroGLYCERIN (NITROSTAT) 0.4 MG SL tablet Place 1 tablet (0.4 mg total) under the tongue every 5 (five) minutes as needed for chest pain.   VITAMIN D, CHOLECALCIFEROL, PO Take 2,000 mg by mouth daily.   No facility-administered encounter medications on file as of 07/25/2020.    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   Advanced care planning/counseling discussion 11/27/2016   Insomnia 05/19/2016   Hypertension 10/25/2014   Gout 10/25/2014   Reflux 10/25/2014   Hyperlipemia 10/25/2014   BPH (benign prostatic hyperplasia) 10/25/2014    Conditions to be addressed/monitored:CAD, HTN, and HLD  Care Plan : RNCM: Coronary Artery Disease (Adult) and HLD  Updates made by Inge Rise, RN since 07/25/2020 12:00 AM     Problem: RNCM: Disease Progression (Coronary Artery Disease)   Priority: Medium     Goal: RNCM: Disease Progression Prevented or Minimized   Expected End Date: 01/24/2021  This Visit's Progress: On track  Priority: Medium  Note:   Current Barriers:  Poorly controlled hyperlipidemia, complicated by caregiver stress, recent diagnosis of COVID 19 in January 2022 Current antihyperlipidemic regimen: Lipitor 80 mg QD Most recent lipid panel:  Lab Results  Component Value Date   CHOL 127 03/28/2020   CHOL 130 09/28/2019   CHOL 124 03/28/2019   Lab Results  Component Value Date   HDL 45 03/28/2020   HDL 40 09/28/2019   HDL 42 03/28/2019   Lab Results  Component Value Date   LDLCALC  62 03/28/2020   LDLCALC 65 09/28/2019   LDLCALC 55 03/28/2019   Lab Results  Component Value Date   TRIG 108 03/28/2020   TRIG 142 09/28/2019   TRIG 161 (H) 03/28/2019   Lab Results  Component Value Date   CHOLHDL 3.8 09/27/2018   CHOLHDL 3.2 12/16/2017   CHOLHDL 3.0 07/18/2017   No results found for: LDLDIRECT  No results found for: LDLDIRECT  ASCVD risk enhancing conditions: age >59, CAD,  HTN,  History of stroke  Does not contact provider office for questions/concerns  RN Care Manager Clinical Goal(s):  Over the next 120 days, patient will work with Consulting civil engineer, providers, and care team towards execution of optimized self-health management plan Over the next 120 days, patient will verbalize understanding of plan for effective management of HLD and CAD Over the next 120 days, patient will work with [facility] and pcp  to address needs related to effective management of CAD and HLD  Over the next 120 days, patient will attend all scheduled medical appointments: next is 09-28-2020 at 9:00am Over the next 120 days, patient will demonstrate improved adherence to prescribed treatment plan for CAD and HLD  as evidenced bylabs WNL, following a heart healthy diet, maintaining weight, and working with the CCM team to optimize health and well being   Interventions: Collaboration with Charlynne Cousins, MD regarding development and update of comprehensive plan of care as evidenced by provider attestation and co-signature Inter-disciplinary care team collaboration (see longitudinal plan of care) Medication review performed; medication list updated in electronic medical record.  Inter-disciplinary care team collaboration (see longitudinal plan of care) Referred to pharmacy team for assistance with HLD and CAD medication management Evaluation of current treatment plan related to HLD and CAD and patient's adherence to plan as established by provider. 05-09-2020: The patient saw the cardiologist and pcp  recently. Changes made in medications. See HTN care plan also. 07-25-2020: Patient saw cardiologist recently on 07-12-2020 with no changes to medications at this time. Patient states he does feel his Afib at times.  Patient's heart rate remains low in the 40's - 50's/min and "rarely gets ahead 60".  Patient is occasionally symptomatic: some lethargy, dizziness and light-headedness.  Patient is very cautious when turning around or changing positions.  He is mindful to turn and change positions slowly.  Denies any falls. Patient walks daily and plays golf once a week while hired caregiver stays with his wife who has Alzheimer Dementia.  Advised patient to call the office for changes in condition or questions. Also to alert the pcp for worsening sx/sx related to recent COVID 19 diagnosis in January 2022.  The patient verbalized today that he is at 90% better. Still experiencing some cough, productive at times- denies any sx and sx of infection at this time.  Provided education to patient re: sx and sx of infection, following a heart healthy diet, taking medications as directed. 07-25-2020: Patient is eating a healthy diet. Reviewed medications with patient and discussed compliance, no  issues noted at this time.  07-25-2020:  Patient is compliant with all medications. Reviewed scheduled/upcoming provider appointments including: 09-28-2020 at 9:00 am Discussed plans with patient for ongoing care management follow up and provided patient with direct contact information for care management team   Patient Goals/Self-Care Activities: Over the next 120 days, patient will:   - call for medicine refill 2 or 3 days before it runs out - call if I am sick and can't take my medicine - keep a list of all the medicines I take; vitamins and herbals too - learn to read medicine labels - use a pillbox to sort medicine - use an alarm clock or phone to remind me to take my medicine - change to whole grain breads, cereal,  pasta - drink 6 to 8 glasses of water each day - eat 3 to 5 servings of fruits and vegetables each day - eat 5 or 6 small meals each day - fill half the plate with nonstarchy vegetables - limit fast food meals to no more than 1 per week - manage portion size - prepare main meal at home 3 to 5 days each week - read food labels for fat, fiber, carbohydrates and portion size - be open to making changes - I can manage, know and watch for signs of a heart attack - if I have chest pain, call for help - learn about small changes that will make a big difference - learn my personal risk factors  - barriers to treatment adherence reviewed and addressed - functional limitation screening reviewed - healthy lifestyle promoted - medication-adherence assessment completed - medication side effects managed - rescue (action) plan developed - response to pharmacologic therapy monitored - self-awareness of signs/symptoms of worsening disease encouraged  Follow Up Plan: Telephone follow up appointment with care management team member scheduled for: 10-24-2020 at 0945 am      Care Plan : RNCM: Hypertension (Adult)  Updates made by Inge Rise, RN since 07/25/2020 12:00 AM     Problem: RNCM: Hypertension (Hypertension)   Priority: Medium     Long-Range Goal: RNCM: Hypertension Monitored   Expected End Date: 01/24/2021  This Visit's Progress: On track  Priority: Medium  Note:   Objective:  Last practice recorded BP readings:  BP Readings from Last 3 Encounters:  07/12/20 138/76  04/04/20 134/72  03/28/20 125/85     Most recent eGFR/CrCl: No results found for: EGFR  No components found for: CRCL Current Barriers:  Knowledge Deficits related to basic understanding of hypertension pathophysiology and self care management Limited Social Support Lacks social connections Does not contact provider office for questions/concerns Case Manager Clinical Goal(s):  Over the next 120 days,  patient will verbalize understanding of plan for hypertension management Over the next 120 days, patient will attend all scheduled medical appointments: 09-28-2020 at 9:00 am Over the next 120 days, patient will demonstrate improved adherence to prescribed treatment plan for hypertension as evidenced by taking all medications as prescribed, monitoring and recording blood pressure as directed, adhering to low sodium/DASH diet Over the next 120 days, patient will demonstrate improved health management independence as evidenced by checking blood pressure as directed and notifying PCP if SBP>160 or DBP > 90, taking all medications as prescribe, and adhering to a low sodium diet as discussed. Over the next 120 days, patient will verbalize basic understanding of hypertension disease process and self health management plan as evidenced by compliance with medications, keeping appointments, maintain active lifestyle, work with  CCM team to optimize health and well being.  Interventions:  Collaboration with Charlynne Cousins, MD regarding development and update of comprehensive plan of care as evidenced by provider attestation and co-signature Inter-disciplinary care team collaboration (see longitudinal plan of care) Evaluation of current treatment plan related to hypertension self management and patient's adherence to plan as established by provider. 05-09-2020: The patient saw the pcp and cardiologist in May. The patient states that the cardiologist changed his medications and he is doing okay with the change but has noticed light headed and dizziness when he changes position. The patient denies any acute distress. Will discuss this with the cardiologist at his upcoming visit. 07-25-2020:  Patient saw cardiologist on 07-12-2020 with no changes to medications at this time.  Patient continues to monitor his blood pressure at home.  Today's blood pressure reading was 132/70 and heart reate 48/min.  Patient denies any further  ringing in his ears  and states he has "not noticed it lately".  Provided education to patient re: stroke prevention, s/s of heart attack and stroke, DASH diet, complications of uncontrolled blood pressure Reviewed medications with patient and discussed importance of compliance. 05-09-2020: The patient is compliant with his medications and denies any new concerns. 07-25-2020: Patient is compliant with all medications. Discussed plans with patient for ongoing care management follow up and provided patient with direct contact information for care management team Advised patient, providing education and rationale, to monitor blood pressure daily and record, calling PCP for findings outside established parameters. 05-09-2020: The patient is monitoring his blood pressure. Has had medication changes and is experiencing sx/sx of orthostatic hypotension. The patient notices sometimes when he changes position that he is experiencing light headedness and dizziness. Education on safety given. Will continue to monitor.  Reviewed scheduled/upcoming provider appointments including: 09-28-2020  Advised the patient to write down questions for pcp for upcoming visit. The patient with recent COVID 19. Has a productive cough but denies any sx and sx of infection at this time. CP he has intermittently. Denies any acute findings from wearing hollister monitor. Will continue to monitor for changes. 05-09-2020: The patient states that he is still having some itching in his ear but his throat is much better. Denies any new concerns.  Patient Goals/Self-Care Activities Over the next 120 days, patient will:  - Self administers medications as prescribed Attends all scheduled provider appointments Calls provider office for new concerns, questions, or BP outside discussed parameters Checks BP and records as discussed Follows a low sodium diet/DASH diet - blood pressure trends reviewed - depression screen reviewed - home or ambulatory  blood pressure monitoring encouraged Follow Up Plan: Telephone follow up appointment with care management team member scheduled for: 10-24-2020 at 0945 am     Plan:Telephone follow up appointment with care management team member scheduled for:  10-24-2020 at 9:45 am  [place] Mobile: [phone]

## 2020-08-01 ENCOUNTER — Other Ambulatory Visit: Payer: Self-pay

## 2020-08-01 ENCOUNTER — Ambulatory Visit (INDEPENDENT_AMBULATORY_CARE_PROVIDER_SITE_OTHER): Payer: Medicare Other | Admitting: Internal Medicine

## 2020-08-01 ENCOUNTER — Encounter: Payer: Self-pay | Admitting: Internal Medicine

## 2020-08-01 VITALS — BP 144/84 | HR 49 | Temp 98.0°F | Ht 68.94 in | Wt 206.0 lb

## 2020-08-01 DIAGNOSIS — I2510 Atherosclerotic heart disease of native coronary artery without angina pectoris: Secondary | ICD-10-CM | POA: Diagnosis not present

## 2020-08-01 DIAGNOSIS — Z1329 Encounter for screening for other suspected endocrine disorder: Secondary | ICD-10-CM

## 2020-08-01 DIAGNOSIS — I10 Essential (primary) hypertension: Secondary | ICD-10-CM | POA: Diagnosis not present

## 2020-08-01 NOTE — Progress Notes (Signed)
BP (!) 144/84   Pulse (!) 49   Temp 98 F (36.7 C) (Oral)   Ht 5' 8.94" (1.751 m)   Wt 206 lb (93.4 kg)   SpO2 96%   BMI 30.48 kg/m    Subjective:    Patient ID: Thomas Miller, male    DOB: 1950-07-31, 70 y.o.   MRN: [id number]  Chief Complaint  Patient presents with   Hypertension   Gastroesophageal Reflux   Coronary Artery Disease   PAF   Hyperlipidemia   Gout    HPI: WHEELER INCORVAIA is a 70 y.o. male  Hypertension This is a chronic problem. Pertinent negatives include no chest pain, headaches, palpitations or shortness of breath.  Gastroesophageal Reflux He reports no abdominal pain, no chest pain, no coughing, no nausea or no wheezing. Pertinent negatives include no fatigue.  Coronary Artery Disease Pertinent negatives include no chest pain, chest tightness, dizziness, leg swelling, palpitations or shortness of breath. Risk factors include hyperlipidemia and hypertension.  Hyperlipidemia Pertinent negatives include no chest pain, myalgias or shortness of breath.   Chief Complaint  Patient presents with   Hypertension   Gastroesophageal Reflux   Coronary Artery Disease   PAF   Hyperlipidemia   Gout    Relevant past medical, surgical, family and social history reviewed and updated as indicated. Interim medical history since our last visit reviewed. Allergies and medications reviewed and updated.  Review of Systems  Constitutional:  Negative for activity change, appetite change, chills, fatigue and fever.  HENT:  Negative for congestion, ear discharge, ear pain and facial swelling.   Eyes:  Negative for pain, discharge and itching.  Respiratory:  Negative for cough, chest tightness, shortness of breath and wheezing.   Cardiovascular:  Negative for chest pain, palpitations and leg swelling.  Gastrointestinal:  Negative for abdominal distention, abdominal pain, blood in stool, constipation, diarrhea, nausea and vomiting.  Endocrine: Negative for cold  intolerance, heat intolerance, polydipsia, polyphagia and polyuria.  Genitourinary:  Negative for difficulty urinating, dysuria, flank pain, frequency, hematuria and urgency.  Musculoskeletal:  Negative for arthralgias, gait problem, joint swelling and myalgias.  Skin:  Negative for color change, rash and wound.  Neurological:  Negative for dizziness, tremors, speech difficulty, weakness, light-headedness, numbness and headaches.  Hematological:  Does not bruise/bleed easily.  Psychiatric/Behavioral:  Negative for agitation, confusion, decreased concentration, sleep disturbance and suicidal ideas.    Per HPI unless specifically indicated above     Objective:    BP (!) 144/84   Pulse (!) 49   Temp 98 F (36.7 C) (Oral)   Ht 5' 8.94" (1.751 m)   Wt 206 lb (93.4 kg)   SpO2 96%   BMI 30.48 kg/m   Wt Readings from Last 3 Encounters:  08/01/20 206 lb (93.4 kg)  07/12/20 207 lb 12.8 oz (94.3 kg)  04/04/20 211 lb 12.8 oz (96.1 kg)    Physical Exam Vitals and nursing note reviewed.  Constitutional:      General: He is not in acute distress.    Appearance: Normal appearance. He is not ill-appearing or diaphoretic.  HENT:     Head: Normocephalic and atraumatic.     Right Ear: Tympanic membrane and external ear normal. There is no impacted cerumen.     Left Ear: External ear normal.     Nose: No congestion or rhinorrhea.     Mouth/Throat:     Pharynx: No oropharyngeal exudate or posterior oropharyngeal erythema.  Eyes:  Conjunctiva/sclera: Conjunctivae normal.     Pupils: Pupils are equal, round, and reactive to light.  Cardiovascular:     Rate and Rhythm: Normal rate and regular rhythm.     Heart sounds: No murmur heard.   No friction rub. No gallop.  Pulmonary:     Effort: No respiratory distress.     Breath sounds: No stridor. No wheezing or rhonchi.  Chest:     Chest wall: No tenderness.  Abdominal:     General: Abdomen is flat. Bowel sounds are normal.     Palpations:  Abdomen is soft. There is no mass.     Tenderness: There is no abdominal tenderness.  Musculoskeletal:     Cervical back: Normal range of motion and neck supple. No rigidity or tenderness.     Left lower leg: No edema.  Skin:    General: Skin is warm and dry.  Neurological:     Mental Status: He is alert.    Results for orders placed or performed in visit on 03/28/20  Comprehensive metabolic panel  Result Value Ref Range   Glucose 82 65 - 99 mg/dL   BUN 14 8 - 27 mg/dL   Creatinine, Ser 1.02 0.76 - 1.27 mg/dL   eGFR 79 >59 mL/min/1.73   BUN/Creatinine Ratio 14 10 - 24   Sodium 143 134 - 144 mmol/L   Potassium 4.0 3.5 - 5.2 mmol/L   Chloride 104 96 - 106 mmol/L   CO2 21 20 - 29 mmol/L   Calcium 9.6 8.6 - 10.2 mg/dL   Total Protein 7.0 6.0 - 8.5 g/dL   Albumin 5.0 (H) 3.8 - 4.8 g/dL   Globulin, Total 2.0 1.5 - 4.5 g/dL   Albumin/Globulin Ratio 2.5 (H) 1.2 - 2.2   Bilirubin Total 1.0 0.0 - 1.2 mg/dL   Alkaline Phosphatase 100 44 - 121 IU/L   AST 30 0 - 40 IU/L   ALT 30 0 - 44 IU/L  Lipid Panel w/o Chol/HDL Ratio  Result Value Ref Range   Cholesterol, Total 127 100 - 199 mg/dL   Triglycerides 108 0 - 149 mg/dL   HDL 45 >39 mg/dL   VLDL Cholesterol Cal 20 5 - 40 mg/dL   LDL Chol Calc (NIH) 62 0 - 99 mg/dL        Current Outpatient Medications:    allopurinol (ZYLOPRIM) 300 MG tablet, Take 1 tablet (300 mg total) by mouth daily., Disp: 90 tablet, Rfl: 0   [place] Cider Vinegar 600 MG CAPS, Take 600 mg by mouth daily., Disp: , Rfl:    aspirin EC 81 MG tablet, Take 2 tablets (162 mg total) by mouth daily., Disp: 90 tablet, Rfl: 3   atorvastatin (LIPITOR) 80 MG tablet, Take 1 tablet (80 mg total) by mouth daily at 6 PM., Disp: 90 tablet, Rfl: 0   esomeprazole (NEXIUM) 40 MG capsule, Take 1 capsule (40 mg total) by mouth daily before breakfast., Disp: 90 capsule, Rfl: 1   Liniments (DEEP BLUE RELIEF) GEL, Apply 1 application topically as needed for pain (joints). In joints, Disp:  , Rfl:    losartan (COZAAR) 25 MG tablet, Take 1 tablet (25 mg total) by mouth daily., Disp: 90 tablet, Rfl: 0   metoprolol tartrate (LOPRESSOR) 25 MG tablet, Take 1 tablet (25 mg total) by mouth 2 (two) times daily., Disp: 180 tablet, Rfl: 0   nitroGLYCERIN (NITROSTAT) 0.4 MG SL tablet, Place 1 tablet (0.4 mg total) under the tongue every 5 (five) minutes as  needed for chest pain., Disp: 25 tablet, Rfl: 6   VITAMIN D, CHOLECALCIFEROL, PO, Take 2,000 mg by mouth daily., Disp: , Rfl:     Assessment & Plan:  HTN  is on metoprolol, losartan for such  HTN :  Continue current meds.  Medication compliance emphasised. pt advised to keep Bp logs. Pt verbalised understanding of the same. Pt to have a low salt diet . Exercise to reach a goal of at least 150 mins a week.  lifestyle modifications explained and pt understands importance of the above.   2. HLD is on lipitor LDL wnl at goal  recheck FLP, check LFT's work on diet, SE of meds explained to pt. low fat and high fiber diet explained to pt.  3. Gout  not acutely inflammed  -  is on allopurinol will check URIC acid levels today - Consider uloric for prevention of such    4. 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 Takes ASA , b blockers, NG as neeeded   Problem List Items Addressed This Visit   None    No orders of the defined types were placed in this encounter.    No orders of the defined types were placed in this encounter.    Follow up plan: No follow-ups on file.  Health Maintenance : PSA : 03/2020 Cscope : cologaurd  Pneumonia vaccine :[id number]

## 2020-09-18 ENCOUNTER — Other Ambulatory Visit: Payer: Self-pay | Admitting: Nurse Practitioner

## 2020-09-18 DIAGNOSIS — E7800 Pure hypercholesterolemia, unspecified: Secondary | ICD-10-CM

## 2020-09-18 DIAGNOSIS — M1000 Idiopathic gout, unspecified site: Secondary | ICD-10-CM

## 2020-09-21 ENCOUNTER — Other Ambulatory Visit: Payer: Medicare Other

## 2020-09-28 ENCOUNTER — Encounter: Payer: Medicare Other | Admitting: Internal Medicine

## 2020-10-03 ENCOUNTER — Other Ambulatory Visit: Payer: Medicare Other

## 2020-10-03 ENCOUNTER — Other Ambulatory Visit: Payer: Self-pay

## 2020-10-03 DIAGNOSIS — Z1329 Encounter for screening for other suspected endocrine disorder: Secondary | ICD-10-CM | POA: Diagnosis not present

## 2020-10-03 DIAGNOSIS — I2510 Atherosclerotic heart disease of native coronary artery without angina pectoris: Secondary | ICD-10-CM

## 2020-10-03 DIAGNOSIS — I10 Essential (primary) hypertension: Secondary | ICD-10-CM | POA: Diagnosis not present

## 2020-10-04 LAB — THYROID PANEL WITH TSH
Free Thyroxine Index: 2.2 (ref 1.2–4.9)
T3 Uptake Ratio: 25 % (ref 24–39)
T4, Total: 8.8 ug/dL (ref 4.5–12.0)
TSH: 1.79 u[IU]/mL (ref 0.450–4.500)

## 2020-10-04 LAB — CBC WITH DIFFERENTIAL/PLATELET
Basophils Absolute: 0.1 10*3/uL (ref 0.0–0.2)
Basos: 1 %
EOS (ABSOLUTE): 0.2 10*3/uL (ref 0.0–0.4)
Eos: 2 %
Hematocrit: 47.2 % (ref 37.5–51.0)
Hemoglobin: 15.7 g/dL (ref 13.0–17.7)
Immature Grans (Abs): 0 10*3/uL (ref 0.0–0.1)
Immature Granulocytes: 0 %
Lymphocytes Absolute: 2.5 10*3/uL (ref 0.7–3.1)
Lymphs: 34 %
MCH: 29.4 pg (ref 26.6–33.0)
MCHC: 33.3 g/dL (ref 31.5–35.7)
MCV: 88 fL (ref 79–97)
Monocytes Absolute: 0.7 10*3/uL (ref 0.1–0.9)
Monocytes: 10 %
Neutrophils Absolute: 3.9 10*3/uL (ref 1.4–7.0)
Neutrophils: 53 %
Platelets: 182 10*3/uL (ref 150–450)
RBC: 5.34 x10E6/uL (ref 4.14–5.80)
RDW: 13.3 % (ref 11.6–15.4)
WBC: 7.3 10*3/uL (ref 3.4–10.8)

## 2020-10-04 LAB — CMP14+EGFR
ALT: 23 IU/L (ref 0–44)
AST: 25 IU/L (ref 0–40)
Albumin/Globulin Ratio: 2.5 — ABNORMAL HIGH (ref 1.2–2.2)
Albumin: 4.8 g/dL — ABNORMAL HIGH (ref 3.7–4.7)
Alkaline Phosphatase: 96 IU/L (ref 44–121)
BUN/Creatinine Ratio: 17 (ref 10–24)
BUN: 17 mg/dL (ref 8–27)
Bilirubin Total: 0.8 mg/dL (ref 0.0–1.2)
CO2: 22 mmol/L (ref 20–29)
Calcium: 9.8 mg/dL (ref 8.6–10.2)
Chloride: 102 mmol/L (ref 96–106)
Creatinine, Ser: 1 mg/dL (ref 0.76–1.27)
Globulin, Total: 1.9 g/dL (ref 1.5–4.5)
Glucose: 85 mg/dL (ref 65–99)
Potassium: 4.4 mmol/L (ref 3.5–5.2)
Sodium: 141 mmol/L (ref 134–144)
Total Protein: 6.7 g/dL (ref 6.0–8.5)
eGFR: 80 mL/min/{1.73_m2} (ref >59)

## 2020-10-04 LAB — LIPID PANEL
Chol/HDL Ratio: 3 ratio (ref 0.0–5.0)
Cholesterol, Total: 129 mg/dL (ref 100–199)
HDL: 43 mg/dL (ref >39)
LDL Chol Calc (NIH): 65 mg/dL (ref 0–99)
Triglycerides: 118 mg/dL (ref 0–149)
VLDL Cholesterol Cal: 21 mg/dL (ref 5–40)

## 2020-10-10 ENCOUNTER — Encounter: Payer: Medicare Other | Admitting: Internal Medicine

## 2020-10-18 ENCOUNTER — Encounter: Payer: Medicare Other | Admitting: Internal Medicine

## 2020-10-24 ENCOUNTER — Other Ambulatory Visit: Payer: Self-pay

## 2020-10-24 ENCOUNTER — Telehealth: Payer: Medicare Other | Admitting: General Practice

## 2020-10-24 ENCOUNTER — Ambulatory Visit (INDEPENDENT_AMBULATORY_CARE_PROVIDER_SITE_OTHER): Payer: Medicare Other | Admitting: Internal Medicine

## 2020-10-24 ENCOUNTER — Ambulatory Visit (INDEPENDENT_AMBULATORY_CARE_PROVIDER_SITE_OTHER): Payer: Medicare Other

## 2020-10-24 ENCOUNTER — Encounter: Payer: Self-pay | Admitting: Internal Medicine

## 2020-10-24 VITALS — BP 139/82 | HR 52 | Temp 99.0°F | Ht 68.58 in | Wt 204.4 lb

## 2020-10-24 DIAGNOSIS — I10 Essential (primary) hypertension: Secondary | ICD-10-CM

## 2020-10-24 DIAGNOSIS — G894 Chronic pain syndrome: Secondary | ICD-10-CM

## 2020-10-24 DIAGNOSIS — E782 Mixed hyperlipidemia: Secondary | ICD-10-CM

## 2020-10-24 DIAGNOSIS — I2510 Atherosclerotic heart disease of native coronary artery without angina pectoris: Secondary | ICD-10-CM

## 2020-10-24 DIAGNOSIS — Z0000 Encounter for general adult medical examination without abnormal findings: Secondary | ICD-10-CM | POA: Diagnosis not present

## 2020-10-24 DIAGNOSIS — G4700 Insomnia, unspecified: Secondary | ICD-10-CM

## 2020-10-24 MED ORDER — AMITRIPTYLINE HCL 10 MG PO TABS: 10.0000 mg | ORAL_TABLET | Freq: Every day | ORAL | 2 refills | Status: DC

## 2020-10-24 NOTE — Patient Instructions (Signed)
Visit Information  PATIENT GOALS:  Goals Addressed             This Visit's Progress    RNCM: Manage My Sleep Problems-Coronary Artery Disease       Timeframe:  Long-Range Goal Priority:  High Start Date:      10-24-2020                       Expected End Date:     10-24-2021                  Follow Up Date 12/26/2020    - develop a new routine to improve sleep - don't eat or exercise right before bedtime - eat healthy - get at least 7 to 8 hours of sleep at night - get outdoors every day (weather permitting) - keep room cool and dark - limit daytime naps - practice relaxation or meditation daily - use a fan or white noise in bedroom    Why is this important?   Sometimes you may have a hard time sleeping after you have a heart attack or surgery.  Feelings like depression or anxiety can make the sleep problems worse.  Many things can help you manage it and sleep better.    Notes: 10-24-2020: The patient states that he only sleeps about 3 to 4 hours a night. The patient has tried OTC products without success. The patient has a physical with the pcp today and wants to discuss options for better sleep with the pcp and ask for recommendations. Will continue to monitor.      RNCM; Cope with Chronic Pain       Timeframe:  Long-Range Goal Priority:  High Start Date:     10-24-2020                        Expected End Date:     10-24-2021                  Follow Up Date 12/26/2020    - join a support group - learn how to meditate - learn relaxation techniques - practice acceptance of chronic pain - spend time with positive people - tell myself I can (not I can't) - think of new ways to do favorite things - use distraction techniques - use relaxation during pain    Why is this important?   Stress makes chronic pain feel worse.  Feelings like depression, anxiety, stress and anger can make your body more sensitive to pain.  Learning ways to cope with stress or depression may  help you find some relief from the pain.     Notes: 10-24-2020: The patient is having back pain going into his leg and states sciatica pain also. He has had this for a while it just seems to be getting worse over time. The patient states that he is going to call the specialist. He denies falls or any safety concerns. Will continue to monitor.         Patient verbalizes understanding of instructions provided today and agrees to view in MyChart.   Telephone follow up appointment with care management team member scheduled for: 12-26-2020 at 0900 am  Alto Denver RN, MSN, CCM Community Care Coordinator [place]  [facility] Mobile: [phone]

## 2020-10-24 NOTE — Progress Notes (Signed)
BP 139/82   Pulse (!) 52   Temp 99 F (37.2 C) (Oral)   Ht 5' 8.58" (1.742 m)   Wt 204 lb 6.4 oz (92.7 kg)   SpO2 96%   BMI 30.55 kg/m    Subjective:    Patient ID: Thomas Miller, male    DOB: [birth year], 70 y.o.   MRN: [id number]  Chief Complaint  Patient presents with  . Annual Exam    HPI: Thomas Miller is a 70 y.o. male  Patient is here for a physical.  He was seen in July for hypertension  Back Pain This is a chronic (has had L3 spinal surg) problem. Episode onset: c/o pain in his lower ext radiating from the lower back.  Has had surgery in his lower back in the past will follow-up with Ortho no notes available in epic. Episode frequency: Patient says he is unable to sleep secondary to pain in his lower extremities which keeps him up all night.   Chief Complaint  Patient presents with  . Annual Exam    Relevant past medical, surgical, family and social history reviewed and updated as indicated. Interim medical history since our last visit reviewed. Allergies and medications reviewed and updated.  Review of Systems  Musculoskeletal:  Positive for back pain.   Per HPI unless specifically indicated above     Objective:    BP 139/82   Pulse (!) 52   Temp 99 F (37.2 C) (Oral)   Ht 5' 8.58" (1.742 m)   Wt 204 lb 6.4 oz (92.7 kg)   SpO2 96%   BMI 30.55 kg/m   Wt Readings from Last 3 Encounters:  10/24/20 204 lb 6.4 oz (92.7 kg)  08/01/20 206 lb (93.4 kg)  07/12/20 207 lb 12.8 oz (94.3 kg)    Physical Exam Vitals and nursing note reviewed.  Constitutional:      General: He is not in acute distress.    Appearance: Normal appearance. He is not toxic-appearing or diaphoretic.  HENT:     Head: Normocephalic and atraumatic.     Right Ear: Tympanic membrane and external ear normal. There is no impacted cerumen.     Left Ear: External ear normal.     Nose: No congestion or rhinorrhea.     Mouth/Throat:     Pharynx: No oropharyngeal exudate or posterior  oropharyngeal erythema.  Eyes:     Conjunctiva/sclera: Conjunctivae normal.     Pupils: Pupils are equal, round, and reactive to light.  Cardiovascular:     Rate and Rhythm: Normal rate and regular rhythm.     Heart sounds: No murmur heard.   No friction rub. No gallop.  Pulmonary:     Effort: No respiratory distress.     Breath sounds: No stridor. No wheezing or rhonchi.  Chest:     Chest wall: No tenderness.  Abdominal:     General: Abdomen is flat. Bowel sounds are normal.     Palpations: Abdomen is soft. There is no mass.     Tenderness: There is no abdominal tenderness.  Musculoskeletal:        General: No swelling or tenderness.     Cervical back: Normal range of motion and neck supple. No rigidity or tenderness.     Left lower leg: No edema.  Skin:    General: Skin is warm and dry.  Neurological:     Mental Status: He is alert.     Cranial Nerves: No cranial nerve  deficit.     Sensory: No sensory deficit.     Motor: No weakness.     Coordination: Coordination normal.     Gait: Gait normal.  Psychiatric:        Mood and Affect: Mood normal.   Results for orders placed or performed in visit on 10/03/20  CMP14+EGFR  Result Value Ref Range   Glucose 85 65 - 99 mg/dL   BUN 17 8 - 27 mg/dL   Creatinine, Ser 1.00 0.76 - 1.27 mg/dL   eGFR 80 >59 mL/min/1.73   BUN/Creatinine Ratio 17 10 - 24   Sodium 141 134 - 144 mmol/L   Potassium 4.4 3.5 - 5.2 mmol/L   Chloride 102 96 - 106 mmol/L   CO2 22 20 - 29 mmol/L   Calcium 9.8 8.6 - 10.2 mg/dL   Total Protein 6.7 6.0 - 8.5 g/dL   Albumin 4.8 (H) 3.7 - 4.7 g/dL   Globulin, Total 1.9 1.5 - 4.5 g/dL   Albumin/Globulin Ratio 2.5 (H) 1.2 - 2.2   Bilirubin Total 0.8 0.0 - 1.2 mg/dL   Alkaline Phosphatase 96 44 - 121 IU/L   AST 25 0 - 40 IU/L   ALT 23 0 - 44 IU/L  Lipid panel  Result Value Ref Range   Cholesterol, Total 129 100 - 199 mg/dL   Triglycerides 118 0 - 149 mg/dL   HDL 43 >39 mg/dL   VLDL Cholesterol Cal 21 5 -  40 mg/dL   LDL Chol Calc (NIH) 65 0 - 99 mg/dL   Chol/HDL Ratio 3.0 0.0 - 5.0 ratio  Thyroid Panel With TSH  Result Value Ref Range   TSH 1.790 0.450 - 4.500 uIU/mL   T4, Total 8.8 4.5 - 12.0 ug/dL   T3 Uptake Ratio 25 24 - 39 %   Free Thyroxine Index 2.2 1.2 - 4.9  CBC with Differential/Platelet  Result Value Ref Range   WBC 7.3 3.4 - 10.8 x10E3/uL   RBC 5.34 4.14 - 5.80 x10E6/uL   Hemoglobin 15.7 13.0 - 17.7 g/dL   Hematocrit 47.2 37.5 - 51.0 %   MCV 88 79 - 97 fL   MCH 29.4 26.6 - 33.0 pg   MCHC 33.3 31.5 - 35.7 g/dL   RDW 13.3 11.6 - 15.4 %   Platelets 182 150 - 450 x10E3/uL   Neutrophils 53 Not Estab. %   Lymphs 34 Not Estab. %   Monocytes 10 Not Estab. %   Eos 2 Not Estab. %   Basos 1 Not Estab. %   Neutrophils Absolute 3.9 1.4 - 7.0 x10E3/uL   Lymphocytes Absolute 2.5 0.7 - 3.1 x10E3/uL   Monocytes Absolute 0.7 0.1 - 0.9 x10E3/uL   EOS (ABSOLUTE) 0.2 0.0 - 0.4 x10E3/uL   Basophils Absolute 0.1 0.0 - 0.2 x10E3/uL   Immature Granulocytes 0 Not Estab. %   Immature Grans (Abs) 0.0 0.0 - 0.1 x10E3/uL        Current Outpatient Medications:  .  allopurinol (ZYLOPRIM) 300 MG tablet, Take 1 tablet (300 mg total) by mouth daily., Disp: 90 tablet, Rfl: 0 .  amitriptyline (ELAVIL) 10 MG tablet, Take 1 tablet (10 mg total) by mouth at bedtime., Disp: 30 tablet, Rfl: 2 .  Berkovich Cider Vinegar 600 MG CAPS, Take 600 mg by mouth daily., Disp: , Rfl:  .  aspirin EC 81 MG tablet, Take 2 tablets (162 mg total) by mouth daily., Disp: 90 tablet, Rfl: 3 .  atorvastatin (LIPITOR) 80 MG tablet, Take  1 tablet (80 mg total) by mouth daily at 6 PM., Disp: 90 tablet, Rfl: 0 .  esomeprazole (NEXIUM) 40 MG capsule, Take 1 capsule (40 mg total) by mouth daily before breakfast., Disp: 90 capsule, Rfl: 1 .  Liniments (DEEP BLUE RELIEF) GEL, Apply 1 application topically as needed for pain (joints). In joints, Disp: , Rfl:  .  losartan (COZAAR) 25 MG tablet, Take 1 tablet (25 mg total) by mouth  daily., Disp: 90 tablet, Rfl: 0 .  metoprolol tartrate (LOPRESSOR) 25 MG tablet, Take 1 tablet (25 mg total) by mouth 2 (two) times daily., Disp: 180 tablet, Rfl: 0 .  nitroGLYCERIN (NITROSTAT) 0.4 MG SL tablet, Place 1 tablet (0.4 mg total) under the tongue every 5 (five) minutes as needed for chest pain., Disp: 25 tablet, Rfl: 6 .  VITAMIN D, CHOLECALCIFEROL, PO, Take 2,000 mg by mouth daily., Disp: , Rfl:     Assessment & Plan:  HTN  is on metoprolol, losartan for such blood pressure little elevated.  Continue current meds.  Medication compliance emphasised. pt advised to keep Bp logs. Pt verbalised understanding of the same. Pt to have a low salt diet . Exercise to reach a goal of at least 150 mins a week.  lifestyle modifications explained and pt understands importance of the above.    2. HLD is on lipitor LDL wnl at goal at LDL 60 recheck FLP, check LFT's work on diet, SE of meds explained to pt. low fat and high fiber diet explained to pt.   3. Gout  -  is on allopurinol will check URIC acid levels today - Consider uloric for prevention of such      4. 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 Takes ASA , b blockers, NG as needed   Sees Dr. Julianne Rice in [place].  PR low at 52 is on metoprolol bid   5. Back pain - Dr. Ronnald Ramp @ [facility] [place]  Has had surgery 18 months ago no notes avialable. Will need to obtain records. Will need to start patient on amitriptyline 10 mg every afternoon return to clinic for follow-up in a month patient already has an appointment scheduled for October we will follow-up on the above at that visit.   6. Insomnia form pain  in his right leg:  Problem List Items Addressed This Visit       Cardiovascular and Mediastinum   Hypertension   CAD (coronary artery disease) - Primary     Other   Hyperlipemia     No orders of the defined types were placed in this encounter.    Meds ordered this encounter  Medications  .  amitriptyline (ELAVIL) 10 MG tablet    Sig: Take 1 tablet (10 mg total) by mouth at bedtime.    Dispense:  30 tablet    Refill:  2     Follow up plan: No follow-ups on file.   Labs next visit : CBC, CMP, FLP, HBA1C, TSH, PSA, urine microalbumin Labs 1 week prior to next visit.

## 2020-10-24 NOTE — Chronic Care Management (AMB) (Signed)
Chronic Care Management   CCM RN Visit Note  10/24/2020 Name: Thomas Miller MRN: [id number] DOB: 1950/12/17  Subjective: Thomas Miller is a 70 y.o. year old male who is a primary care patient of Vigg, Avanti, MD. The care management team was consulted for assistance with disease management and care coordination needs.    Engaged with patient by telephone for follow up visit in response to provider referral for case management and/or care coordination services.   Consent to Services:  The patient was given information about Chronic Care Management services, agreed to services, and gave verbal consent prior to initiation of services.  Please see initial visit note for detailed documentation.   Patient agreed to services and verbal consent obtained.   Assessment: Review of patient past medical history, allergies, medications, health status, including review of consultants reports, laboratory and other test data, was performed as part of comprehensive evaluation and provision of chronic care management services.   SDOH (Social Determinants of Health) assessments and interventions performed:  SDOH Interventions    Flowsheet Row Most Recent Value  SDOH Interventions   Food Insecurity Interventions Intervention Not Indicated  Financial Strain Interventions Intervention Not Indicated  Physical Activity Interventions Other (Comments)  [plays golf 2 days a week and walks anywhere from 1 to 4 miles a day]  Social Connections Interventions Intervention Not Indicated  Transportation Interventions Intervention Not Indicated        CCM Care Plan  Allergies  Allergen Reactions   Doxycycline Rash   Ace Inhibitors Cough    "cough"   Codeine Other (See Comments)    Insomnia   Naprosyn [Naproxen] Rash    Outpatient Encounter Medications as of 10/24/2020  Medication Sig   allopurinol (ZYLOPRIM) 300 MG tablet Take 1 tablet (300 mg total) by mouth daily.   Gengler Cider Vinegar 600 MG CAPS Take  600 mg by mouth daily.   aspirin EC 81 MG tablet Take 2 tablets (162 mg total) by mouth daily.   atorvastatin (LIPITOR) 80 MG tablet Take 1 tablet (80 mg total) by mouth daily at 6 PM.   esomeprazole (NEXIUM) 40 MG capsule Take 1 capsule (40 mg total) by mouth daily before breakfast.   Liniments (DEEP BLUE RELIEF) GEL Apply 1 application topically as needed for pain (joints). In joints   losartan (COZAAR) 25 MG tablet Take 1 tablet (25 mg total) by mouth daily.   metoprolol tartrate (LOPRESSOR) 25 MG tablet Take 1 tablet (25 mg total) by mouth 2 (two) times daily.   nitroGLYCERIN (NITROSTAT) 0.4 MG SL tablet Place 1 tablet (0.4 mg total) under the tongue every 5 (five) minutes as needed for chest pain.   VITAMIN D, CHOLECALCIFEROL, PO Take 2,000 mg by mouth daily.   No facility-administered encounter medications on file as of 10/24/2020.    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   Advanced care planning/counseling discussion 11/27/2016   Insomnia 05/19/2016   Hypertension 10/25/2014   Gout 10/25/2014   Reflux 10/25/2014   Hyperlipemia 10/25/2014   BPH (benign prostatic hyperplasia) 10/25/2014    Conditions to be addressed/monitored:CAD, HTN, HLD, and chronic  pain and insomnia  Care Plan : RNCM: Coronary Artery Disease (Adult) and HLD  Updates made by Vanita Ingles, RN since 10/24/2020 12:00 AM     Problem: RNCM: Disease Progression (Coronary Artery Disease)   Priority: Medium     Long-Range Goal: RNCM: Disease Progression Prevented or Minimized   Start Date: 03/14/2020  Expected End Date: 11/20/2021  This Visit's Progress: On  track  Recent Progress: On track  Priority: Medium  Note:   Current Barriers:  Poorly controlled hyperlipidemia, complicated by caregiver stress, recent diagnosis of COVID 19 in January 2022 Current antihyperlipidemic regimen: Lipitor 80 mg QD Most recent lipid panel:  Lab Results  Component Value Date   CHOL 129 10/03/2020   CHOL 127 03/28/2020   CHOL 130 09/28/2019   Lab Results  Component Value Date   HDL 43 10/03/2020   HDL 45 03/28/2020   HDL 40 09/28/2019   Lab Results  Component Value Date   LDLCALC 65 10/03/2020   LDLCALC 62 03/28/2020   LDLCALC 65 09/28/2019   Lab Results  Component Value Date   TRIG 118 10/03/2020   TRIG 108 03/28/2020   TRIG 142 09/28/2019   Lab Results  Component Value Date   CHOLHDL 3.0 10/03/2020   CHOLHDL 3.8 09/27/2018   CHOLHDL 3.2 12/16/2017   No results found for: LDLDIRECT  No results found for: LDLDIRECT  ASCVD risk enhancing conditions: age >68, CAD,  HTN,  History of stroke  Does not contact provider office for questions/concerns  RN Care Manager Clinical Goal(s):   patient will work with Consulting civil engineer, providers, and care team towards execution of optimized self-health management plan patient will verbalize understanding of plan for effective management of HLD and CAD  patient will work with [facility] and pcp  to address needs related to effective management of CAD and HLD  patient will attend all scheduled medical appointments: next is 10-24-2020 at 2 pm  patient will demonstrate improved adherence to prescribed treatment plan for CAD and HLD  as evidenced bylabs WNL, following a heart healthy diet, maintaining weight, and working with the CCM team to optimize health and well being   Interventions: Collaboration with Vigg, Avanti, MD regarding development and update of comprehensive plan of care as evidenced by provider attestation and co-signature Inter-disciplinary care team collaboration (see longitudinal plan of  care) Medication review performed; medication list updated in electronic medical record.  Inter-disciplinary care team collaboration (see longitudinal plan of care) Referred to pharmacy team for assistance with HLD and CAD medication management Evaluation of current treatment plan related to HLD and CAD and patient's adherence to plan as established by provider. 05-09-2020: The patient saw the cardiologist and pcp recently. Changes made in medications. See HTN care plan also. 07-25-2020: Patient saw cardiologist recently on 07-12-2020 with no changes to medications at this time. Patient states he does feel his Afib at times.  Patient's heart rate remains low in the 40's - 50's/min and "rarely gets ahead 60".  Patient is occasionally symptomatic: some lethargy, dizziness and light-headedness.  Patient is very cautious when turning around or changing positions.  He is mindful to turn and change positions slowly.  Denies any falls. Patient walks daily and plays golf once a week while hired caregiver stays with his wife who has Alzheimer Dementia. 10-24-2020: The patient goes to see the pcp today for a physical and will discuss his insomnia. The patient states he has an appointment next month with his cardiologist. They are  monitoring him closely. Expressed concern over his low heart beat. He states that he feels fine and it has gotten down to 30's The patient states that he doesn't feel different. Blood pressure and heart this am was 128/78 and pulse 41. He does not have any issues with dizziness or light headedness. He states his wife is going to an adult day care during the week from 730 to 4 and this is really helping with him being able to get things done and a respite.  Advised patient to call the office for changes in condition or questions. Also to alert the pcp for worsening sx/sx related to recent COVID 19 diagnosis in January 2022.  The patient verbalized today that he is at 90% better. Still experiencing  some cough, productive at times- denies any sx and sx of infection at this time.  Provided education to patient re: sx and sx of infection, following a heart healthy diet, taking medications as directed. 10-24-2020: Patient is eating a healthy diet. Reviewed medications with patient and discussed compliance, no issues noted at this time.  10-24-2020:  Patient is compliant with all medications. Reviewed scheduled/upcoming provider appointments including: 10-24-2020 at 2 pm Discussed plans with patient for ongoing care management follow up and provided patient with direct contact information for care management team   Patient Goals/Self-Care Activities: patient will:   - call for medicine refill 2 or 3 days before it runs out - call if I am sick and can't take my medicine - keep a list of all the medicines I take; vitamins and herbals too - learn to read medicine labels - use a pillbox to sort medicine - use an alarm clock or phone to remind me to take my medicine - change to whole grain breads, cereal, pasta - drink 6 to 8 glasses of water each day - eat 3 to 5 servings of fruits and vegetables each day - eat 5 or 6 small meals each day - fill half the plate with nonstarchy vegetables - limit fast food meals to no more than 1 per week - manage portion size - prepare main meal at home 3 to 5 days each week - read food labels for fat, fiber, carbohydrates and portion size - be open to making changes - I can manage, know and watch for signs of a heart attack - if I have chest pain, call for help - learn about small changes that will make a big difference - learn my personal risk factors  - barriers to treatment adherence reviewed and addressed - functional limitation screening reviewed - healthy lifestyle promoted - medication-adherence assessment completed - medication side effects managed - rescue (action) plan developed - response to pharmacologic therapy monitored - self-awareness  of signs/symptoms of worsening disease encouraged  Follow Up Plan: Telephone follow up appointment with care management team member scheduled for: 12-26-2020 at 0900 am      Task: RNCM: Alleviate Barriers to Coronary Artery Disease Therapy Completed 10/24/2020  Outcome: Positive  Note:   Care Management Activities:    - barriers to treatment adherence reviewed and addressed - functional limitation screening reviewed - healthy lifestyle promoted - medication-adherence assessment completed - medication side effects managed - rescue (action) plan developed - response to pharmacologic therapy monitored - self-awareness of signs/symptoms of worsening disease encouraged        Care Plan : RNCM: Hypertension (Adult)  Updates made by Vanita Ingles, RN since 10/24/2020 12:00 AM     Problem:  RNCM: Hypertension (Hypertension)   Priority: Medium     Long-Range Goal: RNCM: Hypertension Monitored   Start Date: 03/14/2020  Expected End Date: 10/14/2021  This Visit's Progress: On track  Recent Progress: On track  Priority: Medium  Note:   Objective:  Last practice recorded BP readings:  BP Readings from Last 3 Encounters:  08/01/20 (!) 144/84  07/12/20 138/76  04/04/20 134/72     Most recent eGFR/CrCl: No results found for: EGFR  No components found for: CRCL Current Barriers:  Knowledge Deficits related to basic understanding of hypertension pathophysiology and self care management Limited Social Support Lacks social connections Does not contact provider office for questions/concerns Case Manager Clinical Goal(s):   patient will verbalize understanding of plan for hypertension management patient will attend all scheduled medical appointments: 10-24-2020 at 2 pm patient will demonstrate improved adherence to prescribed treatment plan for hypertension as evidenced by taking all medications as prescribed, monitoring and recording blood pressure as directed, adhering to low sodium/DASH  diet  patient will demonstrate improved health management independence as evidenced by checking blood pressure as directed and notifying PCP if SBP>160 or DBP > 90, taking all medications as prescribe, and adhering to a low sodium diet as discussed. patient will verbalize basic understanding of hypertension disease process and self health management plan as evidenced by compliance with medications, keeping appointments, maintain active lifestyle, work with CCM team to optimize health and well being.  Interventions:  Collaboration with Charlynne Cousins, MD regarding development and update of comprehensive plan of care as evidenced by provider attestation and co-signature Inter-disciplinary care team collaboration (see longitudinal plan of care) Evaluation of current treatment plan related to hypertension self management and patient's adherence to plan as established by provider. 05-09-2020: The patient saw the pcp and cardiologist in May. The patient states that the cardiologist changed his medications and he is doing okay with the change but has noticed light headed and dizziness when he changes position. The patient denies any acute distress. Will discuss this with the cardiologist at his upcoming visit. 07-25-2020:  Patient saw cardiologist on 07-12-2020 with no changes to medications at this time.  Patient continues to monitor his blood pressure at home.  Today's blood pressure reading was 132/70 and heart reate 48/min.  Patient denies any further ringing in his ears  and states he has "not noticed it lately". 10-24-2020: The patient is doing well. Denies any acute distress. His blood pressure this am was 128/78 and pulse 41. Will see the cardiologist next month for follow up. Has a physical today with the pcp and will discuss insomnia. The patient states he has always had a low pulse rate. Denies any safety concerns. Education on discussing options with the cardiologist. States a pacemaker has been discussed in  the past. The patient was at [employer] running some errands. He says his wife being in day care Monday through Friday is really helpful for him as he has to take care of all the things in the house and errands. He states he feels good.  Provided education to patient re: stroke prevention, s/s of heart attack and stroke, DASH diet, complications of uncontrolled blood pressure Reviewed medications with patient and discussed importance of compliance. 05-09-2020: The patient is compliant with his medications and denies any new concerns. 10-24-2020: Patient is compliant with all medications. Discussed plans with patient for ongoing care management follow up and provided patient with direct contact information for care management team Advised patient, providing education and  rationale, to monitor blood pressure daily and record, calling PCP for findings outside established parameters. 05-09-2020: The patient is monitoring his blood pressure. Has had medication changes and is experiencing sx/sx of orthostatic hypotension. The patient notices sometimes when he changes position that he is experiencing light headedness and dizziness. Education on safety given. Will continue to monitor. 10-24-2020: The patient takes his blood pressures daily and records. Denies any acute changes. Today his blood pressure was 144/84 and his pulse was 49. Eduction and support given.  Reviewed scheduled/upcoming provider appointments including: 10-24-2020 at 2 pm Advised the patient to write down questions for pcp for upcoming visit. The patient with recent COVID 19. Has a productive cough but denies any sx and sx of infection at this time. CP he has intermittently. Denies any acute findings from wearing hollister monitor. Will continue to monitor for changes. 05-09-2020: The patient states that he is still having some itching in his ear but his throat is much better. Denies any new concerns. 10-24-2020: The patient to see the pcp today. Advised  the patient to write down questions for the pcp for his visit today. He does want to ask for recommendations for insomnia.  Patient Goals/Self-Care Activities  patient will:  - Self administers medications as prescribed Attends all scheduled provider appointments Calls provider office for new concerns, questions, or BP outside discussed parameters Checks BP and records as discussed Follows a low sodium diet/DASH diet - blood pressure trends reviewed - depression screen reviewed - home or ambulatory blood pressure monitoring encouraged Follow Up Plan: Telephone follow up appointment with care management team member scheduled for: 12-26-2020 at 0900 am    Task: RNCM: Identify and Monitor Blood Pressure Elevation Completed 10/24/2020  Outcome: Positive  Note:   Care Management Activities:    - blood pressure trends reviewed - depression screen reviewed - home or ambulatory blood pressure monitoring encouraged        Care Plan : RNCM: Chronic Pain (Adult)  Updates made by Vanita Ingles, RN since 10/24/2020 12:00 AM     Problem: RNCM: Chronic Pain Management (Chronic Pain)   Priority: High  Onset Date: 10/24/2020     Long-Range Goal: RNCM: Chronic Pain Managed   Start Date: 10/24/2020  Expected End Date: 10/24/2021  This Visit's Progress: On track  Priority: High  Note:   Current Barriers:  Knowledge Deficits related to managing acute/chronic pain Non-adherence to scheduled provider appointments Non-adherence to prescribed medication regimen Difficulty obtaining medications Chronic Disease Management support and education needs related to chronic pain Unable to independently manage pain in back and leg with sciatica  Nurse Case Manager Clinical Goal(s):  patient will verbalize understanding of plan for managing pain patient will work with [place] to address concerns with pain and discomfort patient will attend all scheduled medical appointments: 10-24-2020 at 2  pm patient will demonstrate use of different relaxation  skills and/or diversional activities to assist with pain reduction (distraction, imagery, relaxation, massage, acupressure, TENS, heat, and cold application patient will report pain at a level less than 3 to 4 on a 10-10 rating scale patient will use pharmacological and nonpharmacological pain relief strategies patient will verbalize acceptable level of pain relief and ability to engage in desired activities patient will engage in desired activities without an increase in pain level Interventions:  Collaboration with Vigg, Avanti, MD regarding development and update of comprehensive plan of care as evidenced by provider attestation and co-signature Inter-disciplinary care team collaboration (see longitudinal plan  of care) - careful application of heat or ice encouraged - complementary therapy use encouraged - deep breathing, relaxation and mindfulness use promoted - effectiveness of pharmacologic therapy monitored - enrollment in pain education program arranged - medication-induced side effects managed - misuse of pain medication assessed - motivation and barriers to change assessed and addressed - mutually acceptable comfort goal set - pain assessed - pain treatment goals reviewed - premedication prior to activity encouraged Evaluation of current treatment plan related to pain in back and leg with sciatica  and patient's adherence to plan as established by provider. Advised patient to talk to the pcp about pain he is experiencing. He is also going to call his ortho provider for recommendations  Provided education to patient re: safety and fall prevention, and discussing treatment options with the provider for management of chronic pain  Reviewed medications with patient and discussed patient states he is only taking Tylenol for pain relief when needed. It is helpful but he has noticed the pain is worse.  Discussed plans with patient  for ongoing care management follow up and provided patient with direct contact information for care management team Allow patient to maintain a diary of pain ratings, timing, precipitating events, medications, treatments, and what works best to relieve pain,  Refer to support groups and self-help groups Educate patient about the use of pharmacological interventions for pain management- antianxiety, antidepressants, NSAIDS, opioid analgesics,  Explain the importance of lifestyle modifications to effective pain management  Patient Goals/Self Care Activities:   Self-administers medications as prescribed Attends all scheduled provider appointments Calls pharmacy for medication refills Calls provider office for new concerns or questions Follow Up Plan: Telephone follow up appointment with care management team member scheduled for: 12-26-2020 at 0900 am      Task: Alleviate Barriers to Chronic Pain Management Completed 10/24/2020  Outcome: Positive  Note:   Care Management Activities:    - deep breathing, relaxation and mindfulness use promoted - effectiveness of pharmacologic therapy monitored - medication-induced side effects managed - misuse of pain medication assessed - motivation and barriers to change assessed and addressed - mutually acceptable comfort goal set - pain assessed - pain treatment goals reviewed - premedication prior to activity encouraged    Notes:      Plan:Telephone follow up appointment with care management team member scheduled for:  12-26-2020 at 0900 am  Noreene Larsson RN, MSN, [facility] Mobile: [phone]

## 2020-10-25 ENCOUNTER — Other Ambulatory Visit: Payer: Self-pay | Admitting: Internal Medicine

## 2020-10-25 DIAGNOSIS — K219 Gastro-esophageal reflux disease without esophagitis: Secondary | ICD-10-CM

## 2020-10-25 NOTE — Telephone Encounter (Signed)
Future OV 11/05/20  Approved per protocol.

## 2020-10-26 DIAGNOSIS — I2510 Atherosclerotic heart disease of native coronary artery without angina pectoris: Secondary | ICD-10-CM | POA: Diagnosis not present

## 2020-10-26 DIAGNOSIS — E782 Mixed hyperlipidemia: Secondary | ICD-10-CM

## 2020-10-26 DIAGNOSIS — I10 Essential (primary) hypertension: Secondary | ICD-10-CM

## 2020-11-01 DIAGNOSIS — M5416 Radiculopathy, lumbar region: Secondary | ICD-10-CM | POA: Diagnosis not present

## 2020-11-01 DIAGNOSIS — I10 Essential (primary) hypertension: Secondary | ICD-10-CM | POA: Diagnosis not present

## 2020-11-05 ENCOUNTER — Ambulatory Visit (INDEPENDENT_AMBULATORY_CARE_PROVIDER_SITE_OTHER): Payer: Medicare Other

## 2020-11-05 VITALS — BP 123/80 | HR 54 | Ht 69.0 in | Wt 205.0 lb

## 2020-11-05 DIAGNOSIS — Z0000 Encounter for general adult medical examination without abnormal findings: Secondary | ICD-10-CM | POA: Diagnosis not present

## 2020-11-05 NOTE — Progress Notes (Signed)
I connected with Thomas Miller today by telephone and verified that I am speaking with the correct person using two identifiers. Location patient: home Location provider: work Persons participating in the virtual visit: Shashank, Kwasnik LPN.   I discussed the limitations, risks, security and privacy concerns of performing an evaluation and management service by telephone and the availability of in person appointments. I also discussed with the patient that there may be a patient responsible charge related to this service. The patient expressed understanding and verbally consented to this telephonic visit.    Interactive audio and video telecommunications were attempted between this provider and patient, however failed, due to patient having technical difficulties OR patient did not have access to video capability.  We continued and completed visit with audio only.     Vital signs may be patient reported or missing.  Subjective:   Thomas Miller is a 70 y.o. male who presents for Medicare Annual/Subsequent preventive examination.  Review of Systems     Cardiac Risk Factors include: advanced age (>57men, >36 women);dyslipidemia;hypertension;male gender;obesity (BMI >30kg/m2)     Objective:    Today's Vitals   11/05/20 0811 11/05/20 0812  BP: 123/80   Pulse: (!) 54   Weight: 205 lb (93 kg)   Height: 5\' 9"  (1.753 m)   PainSc:  4    Body mass index is 30.27 kg/m.  Advanced Directives 11/05/2020 11/04/2019 08/28/2019 11/01/2018 01/02/2018 10/28/2017 07/18/2017  Does Patient Have a Medical Advance Directive? Yes Yes No Yes No Yes Yes  Type of 07/20/2017 of [place];Living will Healthcare Power of [place];Living will - Living will;Healthcare Power of Attorney - Living will;Healthcare Power of [place] Power of [place];Living will  Does patient want to make changes to medical advance directive? - - - - - - No - Patient declined  Copy of Healthcare  Power of Attorney in Chart? No - copy requested No - copy requested - No - copy requested - No - copy requested No - copy requested  Would patient like information on creating a medical advance directive? - - - - No - Patient declined - -    Current Medications (verified) Outpatient Encounter Medications as of 11/05/2020  Medication Sig   allopurinol (ZYLOPRIM) 300 MG tablet Take 1 tablet (300 mg total) by mouth daily.   amitriptyline (ELAVIL) 10 MG tablet Take 1 tablet (10 mg total) by mouth at bedtime.   Whiting Cider Vinegar 600 MG CAPS Take 600 mg by mouth daily.   aspirin EC 81 MG tablet Take 2 tablets (162 mg total) by mouth daily.   atorvastatin (LIPITOR) 80 MG tablet Take 1 tablet (80 mg total) by mouth daily at 6 PM.   esomeprazole (NEXIUM) 40 MG capsule Take 1 capsule (40 mg total) by mouth daily before breakfast.   Liniments (DEEP BLUE RELIEF) GEL Apply 1 application topically as needed for pain (joints). In joints   losartan (COZAAR) 25 MG tablet Take 1 tablet (25 mg total) by mouth daily.   metoprolol tartrate (LOPRESSOR) 25 MG tablet Take 1 tablet (25 mg total) by mouth 2 (two) times daily.   nitroGLYCERIN (NITROSTAT) 0.4 MG SL tablet Place 1 tablet (0.4 mg total) under the tongue every 5 (five) minutes as needed for chest pain.   VITAMIN D, CHOLECALCIFEROL, PO Take 2,000 mg by mouth daily.   No facility-administered encounter medications on file as of 11/05/2020.    Allergies (verified) Doxycycline, Ace inhibitors, Codeine, and Naprosyn [naproxen]  History: Past Medical History:  Diagnosis Date   Advanced care planning/counseling discussion 11/27/2016   Aortic atherosclerosis (HCC) 03/24/2020   Noted on CT 03/01/19   BPH (benign prostatic hyperplasia) 10/25/2014   CAD (coronary artery disease) 08/07/2017   GERD (gastroesophageal reflux disease) 10/25/2014   Gout    Hearing loss    History of 2019 novel coronavirus disease (COVID-19) 03/24/2020   On 02/09/20   History  of stroke 10/05/2019   Hyperlipemia 10/25/2014   Hypertension    Insomnia 05/19/2016   Kidney stones    PAF (paroxysmal atrial fibrillation) (HCC) 08/07/2017   Reflux 10/25/2014   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   S/P epidural steroid injection 05/2018   Past Surgical History:  Procedure Laterality Date   BACK SURGERY     CORONARY ARTERY BYPASS GRAFT N/A 07/20/2017   Procedure: CORONARY ARTERY BYPASS GRAFTING (CABG) times four using left internal mammary artery to LAD and endoscopically harvested right saphenous vein graft to distal circumflex, intermediate, and PD;  Surgeon: [name], MD;  Location: [facility] OR;  Service: Open Heart Surgery;  Laterality: N/A;   EPIDURAL BLOCK INJECTION  05/2018   L3injections    EYE SURGERY Bilateral 2013   FOOT SURGERY Bilateral    ingrown toenails   GALLBLADDER SURGERY     LEFT HEART CATH AND CORONARY ANGIOGRAPHY N/A 07/17/2017   Procedure: LEFT HEART CATH AND CORONARY ANGIOGRAPHY;  Surgeon: Yvonne Kendall, MD;  Location: [facility] INVASIVE CV LAB;  Service: Cardiovascular;  Laterality: N/A;   SHOULDER SURGERY Right    SPINE SURGERY     Family History  Problem Relation Age of Onset   Diabetes Mother    Lung cancer Father    Stroke Paternal Grandfather    Social History   Socioeconomic History   Marital status: Married    Spouse name: Not on file   Number of children: Not on file   Years of education: Not on file   Highest education level: Associate degree: occupational, Scientist, product/process development, or vocational program  Occupational History   Occupation: retired  Tobacco Use   Smoking status: Never   Smokeless tobacco: Never  Building services engineer Use: Never used  Substance and Sexual Activity   Alcohol use: Not Currently   Drug use: No   Sexual activity: Yes  Other Topics Concern   Not on file  Social History Narrative   Volunteers and golfs    Social Determinants of Health   Financial Resource Strain: Low Risk    Difficulty of Paying Living Expenses:  Not hard at all  Food Insecurity: No Food Insecurity   Worried About Programme researcher, broadcasting/film/video in the Last Year: Never true   Barista in the Last Year: Never true  Transportation Needs: No Transportation Needs   Lack of Transportation (Medical): No   Lack of Transportation (Non-Medical): No  Physical Activity: Sufficiently Active   Days of Exercise per Week: 7 days   Minutes of Exercise per Session: 60 min  Stress: No Stress Concern Present   Feeling of Stress : Not at all  Social Connections: Socially Integrated   Frequency of Communication with Friends and Family: More than three times a week   Frequency of Social Gatherings with Friends and Family: More than three times a week   Attends Religious Services: More than 4 times per year   Active Member of [employer] or Organizations: Yes   Attends Banker Meetings: More than 4 times  per year   Marital Status: Married    Tobacco Counseling Counseling given: Not Answered   Clinical Intake:  Pre-visit preparation completed: Yes  Pain : 0-10 Pain Score: 4  Pain Type: Chronic pain Pain Location: Hip Pain Orientation: Left Pain Descriptors / Indicators: Shooting Pain Onset: More than a month ago Pain Frequency: Constant     Nutritional Status: BMI > 30  Obese Nutritional Risks: None Diabetes: No  How often do you need to have someone help you when you read instructions, pamphlets, or other written materials from your doctor or pharmacy?: 1 - Never What is the last grade level you completed in school?: college  Diabetic? no  Interpreter Needed?: No  Information entered by :: NAllen LPN   Activities of Daily Living In your present state of health, do you have any difficulty performing the following activities: 11/05/2020  Hearing? N  Vision? N  Difficulty concentrating or making decisions? N  Walking or climbing stairs? N  Dressing or bathing? N  Doing errands, shopping? N  Preparing Food and eating ? N   Using the Toilet? N  In the past six months, have you accidently leaked urine? N  Do you have problems with loss of bowel control? N  Managing your Medications? N  Managing your Finances? N  Housekeeping or managing your Housekeeping? N  Some recent data might be hidden    Patient Care Team: Loura Pardon, MD as PCP - General (Internal Medicine) Revankar, Aundra Dubin, MD as PCP - Cardiology (Cardiology) Tia Alert, MD as Consulting Physician (Neurosurgery) [name], MD (Inactive) as Consulting Physician (Cardiothoracic Surgery) Marlowe Sax, RN as Case Manager (General Practice)  Indicate any recent Medical Services you may have received from other than [facility] providers in the past year (date may be approximate).     Assessment:   This is a routine wellness examination for Thomas Miller.  Hearing/Vision screen Vision Screening - Comments:: Regular eye exams, [facility]  Dietary issues and exercise activities discussed: Current Exercise Habits: Home exercise routine, Type of exercise: walking, Time (Minutes): 60, Frequency (Times/Week): 7, Weekly Exercise (Minutes/Week): 420   Goals Addressed             This Visit's Progress    Patient Stated       11/05/2020, no goals       Depression Screen PHQ 2/9 Scores 11/05/2020 10/24/2020 08/01/2020 11/04/2019 09/28/2019 04/12/2019 11/01/2018  PHQ - 2 Score 0 0 0 0 0 0 0  PHQ- 9 Score - 0 0 - 0 - -    Fall Risk Fall Risk  11/05/2020 10/24/2020 08/01/2020 11/04/2019 11/01/2018  Falls in the past year? 0 0 0 0 1  Number falls in past yr: - 0 0 - 0  Injury with Fall? - 0 0 - 0  Comment - - - - just some hip discomfort  Risk for fall due to : Medication side effect No Fall Risks No Fall Risks Medication side effect -  Follow up Falls evaluation completed;Education provided;Falls prevention discussed Falls evaluation completed Falls evaluation completed Falls evaluation completed;Education provided;Falls prevention discussed -     FALL RISK PREVENTION PERTAINING TO THE HOME:  Any stairs in or around the home? Yes  If so, are there any without handrails? No  Home free of loose throw rugs in walkways, pet beds, electrical cords, etc? Yes  Adequate lighting in your home to reduce risk of falls? Yes   ASSISTIVE DEVICES UTILIZED TO PREVENT  FALLS:  Life alert? No  Use of a cane, walker or w/c? No  Grab bars in the bathroom? Yes  Shower chair or bench in shower? No  Elevated toilet seat or a handicapped toilet? Yes   TIMED UP AND GO:  Was the test performed? No .      Cognitive Function:     6CIT Screen 11/05/2020 11/04/2019 11/01/2018 10/28/2017 10/23/2016  What Year? 0 points 0 points 0 points 0 points 0 points  What month? 0 points 0 points 0 points 0 points 0 points  What time? 0 points 0 points 0 points 0 points 0 points  Count back from 20 0 points 0 points 0 points 0 points 0 points  Months in reverse 0 points 0 points 0 points 0 points 0 points  Repeat phrase 2 points 0 points 0 points 0 points 0 points  Total Score 2 0 0 0 0    Immunizations Immunization History  Administered Date(s) Administered   Moderna Sars-Covid-2 Vaccination 09/07/2019, 10/07/2019, 09/11/2020   Pneumococcal Conjugate-13 11/19/2015   Td 11/10/2007   Zoster, Live 02/17/2009    TDAP status: Due, Education has been provided regarding the importance of this vaccine. Advised may receive this vaccine at local pharmacy or Health Dept. Aware to provide a copy of the vaccination record if obtained from local pharmacy or Health Dept. Verbalized acceptance and understanding.  Flu Vaccine status: Declined, Education has been provided regarding the importance of this vaccine but patient still declined. Advised may receive this vaccine at local pharmacy or Health Dept. Aware to provide a copy of the vaccination record if obtained from local pharmacy or Health Dept. Verbalized acceptance and understanding.  Pneumococcal vaccine  status: Up to date  Covid-19 vaccine status: Completed vaccines  Qualifies for Shingles Vaccine? Yes   Zostavax completed Yes   Shingrix Completed?: No.    Education has been provided regarding the importance of this vaccine. Patient has been advised to call insurance company to determine out of pocket expense if they have not yet received this vaccine. Advised may also receive vaccine at local pharmacy or Health Dept. Verbalized acceptance and understanding.  Screening Tests Health Maintenance  Topic Date Due   Zoster Vaccines- Shingrix (1 of 2) Never done   INFLUENZA VACCINE  04/26/2021 (Originally 08/27/2020)   TETANUS/TDAP  08/01/2021 (Originally 11/09/2017)   COVID-19 Vaccine (4 - Booster for Moderna series) 01/11/2021   Fecal DNA (Cologuard)  05/04/2022   Hepatitis C Screening  Completed   HPV VACCINES  Aged Out    Health Maintenance  Health Maintenance Due  Topic Date Due   Zoster Vaccines- Shingrix (1 of 2) Never done    Colorectal cancer screening: Type of screening: Cologuard. Completed 05/04/2019. Repeat every 3 years  Lung Cancer Screening: (Low Dose CT Chest recommended if Age 46-80 years, 30 pack-year currently smoking OR have quit w/in 15years.) does not qualify.   Lung Cancer Screening Referral: no  Additional Screening:  Hepatitis C Screening: does qualify; Completed 05/07/2015  Vision Screening: Recommended annual ophthalmology exams for early detection of glaucoma and other disorders of the eye. Is the patient up to date with their annual eye exam?  Yes  Who is the provider or what is the name of the office in which the patient attends annual eye exams? [facility] If pt is not established with a provider, would they like to be referred to a provider to establish care? No .   Dental Screening: Recommended annual  dental exams for proper oral hygiene  Community Resource Referral / Chronic Care Management: CRR required this visit?  No   CCM required  this visit?  No      Plan:     I have personally reviewed and noted the following in the patient's chart:   Medical and social history Use of alcohol, tobacco or illicit drugs  Current medications and supplements including opioid prescriptions. Patient is not currently taking opioid prescriptions. Functional ability and status Nutritional status Physical activity Advanced directives List of other physicians Hospitalizations, surgeries, and ER visits in previous 12 months Vitals Screenings to include cognitive, depression, and falls Referrals and appointments  In addition, I have reviewed and discussed with patient certain preventive protocols, quality metrics, and best practice recommendations. A written personalized care plan for preventive services as well as general preventive health recommendations were provided to patient.     Barb Merino, LPN   09/[id number]   Nurse Notes:

## 2020-11-05 NOTE — Patient Instructions (Signed)
Thomas Miller , Thank you for taking time to come for your Medicare Wellness Visit. I appreciate your ongoing commitment to your health goals. Please review the following plan we discussed and let me know if I can assist you in the future.   Screening recommendations/referrals: Colonoscopy: cologuard 05/04/2019, due 05/04/2022 Recommended yearly ophthalmology/optometry visit for glaucoma screening and checkup Recommended yearly dental visit for hygiene and checkup  Vaccinations: Influenza vaccine: decline Pneumococcal vaccine: completed 11/19/2015 Tdap vaccine: due Shingles vaccine: discussed   Covid-19:  09/11/2020, 10/07/2019, 09/07/2019  Advanced directives: Please bring a copy of your POA (Power of Attorney) and/or Living Will to your next appointment.   Conditions/risks identified: none  Next appointment: Follow up in one year for your annual wellness visit.   Preventive Care 3 Years and Older, Male Preventive care refers to lifestyle choices and visits with your health care provider that can promote health and wellness. What does preventive care include? A yearly physical exam. This is also called an annual well check. Dental exams once or twice a year. Routine eye exams. Ask your health care provider how often you should have your eyes checked. Personal lifestyle choices, including: Daily care of your teeth and gums. Regular physical activity. Eating a healthy diet. Avoiding tobacco and drug use. Limiting alcohol use. Practicing safe sex. Taking low doses of aspirin every day. Taking vitamin and mineral supplements as recommended by your health care provider. What happens during an annual well check? The services and screenings done by your health care provider during your annual well check will depend on your age, overall health, lifestyle risk factors, and family history of disease. Counseling  Your health care provider may ask you questions about your: Alcohol use. Tobacco  use. Drug use. Emotional well-being. Home and relationship well-being. Sexual activity. Eating habits. History of falls. Memory and ability to understand (cognition). Work and work Astronomer. Screening  You may have the following tests or measurements: Height, weight, and BMI. Blood pressure. Lipid and cholesterol levels. These may be checked every 5 years, or more frequently if you are over 80 years old. Skin check. Lung cancer screening. You may have this screening every year starting at age 39 if you have a 30-pack-year history of smoking and currently smoke or have quit within the past 15 years. Fecal occult blood test (FOBT) of the stool. You may have this test every year starting at age 63. Flexible sigmoidoscopy or colonoscopy. You may have a sigmoidoscopy every 5 years or a colonoscopy every 10 years starting at age 66. Prostate cancer screening. Recommendations will vary depending on your family history and other risks. Hepatitis C blood test. Hepatitis B blood test. Sexually transmitted disease (STD) testing. Diabetes screening. This is done by checking your blood sugar (glucose) after you have not eaten for a while (fasting). You may have this done every 1-3 years. Abdominal aortic aneurysm (AAA) screening. You may need this if you are a current or former smoker. Osteoporosis. You may be screened starting at age 55 if you are at high risk. Talk with your health care provider about your test results, treatment options, and if necessary, the need for more tests. Vaccines  Your health care provider may recommend certain vaccines, such as: Influenza vaccine. This is recommended every year. Tetanus, diphtheria, and acellular pertussis (Tdap, Td) vaccine. You may need a Td booster every 10 years. Zoster vaccine. You may need this after age 68. Pneumococcal 13-valent conjugate (PCV13) vaccine. One dose is recommended after age  65. Pneumococcal polysaccharide (PPSV23) vaccine.  One dose is recommended after age 46. Talk to your health care provider about which screenings and vaccines you need and how often you need them. This information is not intended to replace advice given to you by your health care provider. Make sure you discuss any questions you have with your health care provider. Document Released: 02/09/2015 Document Revised: 10/03/2015 Document Reviewed: 11/14/2014 Elsevier Interactive Patient Education  2017 [place] Prevention in the Home Falls can cause injuries. They can happen to people of all ages. There are many things you can do to make your home safe and to help prevent falls. What can I do on the outside of my home? Regularly fix the edges of walkways and driveways and fix any cracks. Remove anything that might make you trip as you walk through a door, such as a raised step or threshold. Trim any bushes or trees on the path to your home. Use bright outdoor lighting. Clear any walking paths of anything that might make someone trip, such as rocks or tools. Regularly check to see if handrails are loose or broken. Make sure that both sides of any steps have handrails. Any raised decks and porches should have guardrails on the edges. Have any leaves, snow, or ice cleared regularly. Use sand or salt on walking paths during winter. Clean up any spills in your garage right away. This includes oil or grease spills. What can I do in the bathroom? Use night lights. Install grab bars by the toilet and in the tub and shower. Do not use towel bars as grab bars. Use non-skid mats or decals in the tub or shower. If you need to sit down in the shower, use a plastic, non-slip stool. Keep the floor dry. Clean up any water that spills on the floor as soon as it happens. Remove soap buildup in the tub or shower regularly. Attach bath mats securely with double-sided non-slip rug tape. Do not have throw rugs and other things on the floor that can make  you trip. What can I do in the bedroom? Use night lights. Make sure that you have a light by your bed that is easy to reach. Do not use any sheets or blankets that are too big for your bed. They should not hang down onto the floor. Have a firm chair that has side arms. You can use this for support while you get dressed. Do not have throw rugs and other things on the floor that can make you trip. What can I do in the kitchen? Clean up any spills right away. Avoid walking on wet floors. Keep items that you use a lot in easy-to-reach places. If you need to reach something above you, use a strong step stool that has a grab bar. Keep electrical cords out of the way. Do not use floor polish or wax that makes floors slippery. If you must use wax, use non-skid floor wax. Do not have throw rugs and other things on the floor that can make you trip. What can I do with my stairs? Do not leave any items on the stairs. Make sure that there are handrails on both sides of the stairs and use them. Fix handrails that are broken or loose. Make sure that handrails are as long as the stairways. Check any carpeting to make sure that it is firmly attached to the stairs. Fix any carpet that is loose or worn. Avoid having throw rugs at  the top or bottom of the stairs. If you do have throw rugs, attach them to the floor with carpet tape. Make sure that you have a light switch at the top of the stairs and the bottom of the stairs. If you do not have them, ask someone to add them for you. What else can I do to help prevent falls? Wear shoes that: Do not have high heels. Have rubber bottoms. Are comfortable and fit you well. Are closed at the toe. Do not wear sandals. If you use a stepladder: Make sure that it is fully opened. Do not climb a closed stepladder. Make sure that both sides of the stepladder are locked into place. Ask someone to hold it for you, if possible. Clearly mark and make sure that you can  see: Any grab bars or handrails. First and last steps. Where the edge of each step is. Use tools that help you move around (mobility aids) if they are needed. These include: Canes. Walkers. Scooters. Crutches. Turn on the lights when you go into a dark area. Replace any light bulbs as soon as they burn out. Set up your furniture so you have a clear path. Avoid moving your furniture around. If any of your floors are uneven, fix them. If there are any pets around you, be aware of where they are. Review your medicines with your doctor. Some medicines can make you feel dizzy. This can increase your chance of falling. Ask your doctor what other things that you can do to help prevent falls. This information is not intended to replace advice given to you by your health care provider. Make sure you discuss any questions you have with your health care provider. Document Released: 11/09/2008 Document Revised: 06/21/2015 Document Reviewed: 02/17/2014 Elsevier Interactive Patient Education  2017 [employer].

## 2020-11-08 ENCOUNTER — Ambulatory Visit (INDEPENDENT_AMBULATORY_CARE_PROVIDER_SITE_OTHER): Payer: Medicare Other | Admitting: Internal Medicine

## 2020-11-08 ENCOUNTER — Other Ambulatory Visit: Payer: Self-pay

## 2020-11-08 ENCOUNTER — Encounter: Payer: Self-pay | Admitting: Internal Medicine

## 2020-11-08 VITALS — BP 123/79 | HR 52 | Temp 98.1°F | Ht 69.02 in | Wt 205.6 lb

## 2020-11-08 DIAGNOSIS — M5431 Sciatica, right side: Secondary | ICD-10-CM | POA: Diagnosis not present

## 2020-11-08 DIAGNOSIS — E782 Mixed hyperlipidemia: Secondary | ICD-10-CM | POA: Diagnosis not present

## 2020-11-08 DIAGNOSIS — M5416 Radiculopathy, lumbar region: Secondary | ICD-10-CM | POA: Diagnosis not present

## 2020-11-08 DIAGNOSIS — I2510 Atherosclerotic heart disease of native coronary artery without angina pectoris: Secondary | ICD-10-CM | POA: Diagnosis not present

## 2020-11-08 DIAGNOSIS — I10 Essential (primary) hypertension: Secondary | ICD-10-CM | POA: Diagnosis not present

## 2020-11-08 DIAGNOSIS — M47816 Spondylosis without myelopathy or radiculopathy, lumbar region: Secondary | ICD-10-CM | POA: Diagnosis not present

## 2020-11-08 MED ORDER — GABAPENTIN 100 MG PO CAPS: 100.0000 mg | ORAL_CAPSULE | Freq: Every day | ORAL | 1 refills | Status: DC

## 2020-11-08 NOTE — Progress Notes (Signed)
BP 123/79   Pulse (!) 52   Temp 98.1 F (36.7 C) (Oral)   Ht 5' 9.02" (1.753 m)   Wt 205 lb 9.6 oz (93.3 kg)   SpO2 98%   BMI 30.35 kg/m    Subjective:    Patient ID: Thomas Miller, male    DOB: 06-20-1950, 70 y.o.   MRN: [id number]  Chief Complaint  Patient presents with   Hypertension   Coronary Artery Disease   Chronic Kidney Disease   Hyperlipidemia   Back Pain    Left low back pain radiating to the leg, Patent states he is having and MRI today at 12:30    HPI: Thomas Miller is a 70 y.o. male  Hypertension This is a chronic problem. The problem is controlled. Pertinent negatives include no anxiety, blurred vision, chest pain, headaches, malaise/fatigue, neck pain, orthopnea, palpitations, peripheral edema or shortness of breath.  Hyperlipidemia This is a chronic problem. The problem is controlled. Pertinent negatives include no chest pain or shortness of breath.  Back Pain This is a chronic (MRI of lower back today sees neurosurg, has had sterod pills tapering dose per pt. did help some .has a ho sciatica , whe nhe gets to walking he is ok. his lefs burningin the right lower leg and hip , getting into toes todya) problem. Episode onset: Has palyed golf yesterday and walked about 11000 steps Tuesday. Pertinent negatives include no chest pain or headaches.   Chief Complaint  Patient presents with   Hypertension   Coronary Artery Disease   Chronic Kidney Disease   Hyperlipidemia   Back Pain    Left low back pain radiating to the leg, Patent states he is having and MRI today at 12:30    Relevant past medical, surgical, family and social history reviewed and updated as indicated. Interim medical history since our last visit reviewed. Allergies and medications reviewed and updated.  Review of Systems  Constitutional:  Negative for malaise/fatigue.  Eyes:  Negative for blurred vision.  Respiratory:  Negative for shortness of breath.   Cardiovascular:  Negative for  chest pain, palpitations and orthopnea.  Musculoskeletal:  Positive for back pain. Negative for neck pain.  Neurological:  Negative for headaches.   Per HPI unless specifically indicated above     Objective:    BP 123/79   Pulse (!) 52   Temp 98.1 F (36.7 C) (Oral)   Ht 5' 9.02" (1.753 m)   Wt 205 lb 9.6 oz (93.3 kg)   SpO2 98%   BMI 30.35 kg/m   Wt Readings from Last 3 Encounters:  11/08/20 205 lb 9.6 oz (93.3 kg)  11/05/20 205 lb (93 kg)  10/24/20 204 lb 6.4 oz (92.7 kg)    Physical Exam  Results for orders placed or performed in visit on 10/03/20  CMP14+EGFR  Result Value Ref Range   Glucose 85 65 - 99 mg/dL   BUN 17 8 - 27 mg/dL   Creatinine, Ser 1.00 0.76 - 1.27 mg/dL   eGFR 80 >59 mL/min/1.73   BUN/Creatinine Ratio 17 10 - 24   Sodium 141 134 - 144 mmol/L   Potassium 4.4 3.5 - 5.2 mmol/L   Chloride 102 96 - 106 mmol/L   CO2 22 20 - 29 mmol/L   Calcium 9.8 8.6 - 10.2 mg/dL   Total Protein 6.7 6.0 - 8.5 g/dL   Albumin 4.8 (H) 3.7 - 4.7 g/dL   Globulin, Total 1.9 1.5 - 4.5 g/dL  Albumin/Globulin Ratio 2.5 (H) 1.2 - 2.2   Bilirubin Total 0.8 0.0 - 1.2 mg/dL   Alkaline Phosphatase 96 44 - 121 IU/L   AST 25 0 - 40 IU/L   ALT 23 0 - 44 IU/L  Lipid panel  Result Value Ref Range   Cholesterol, Total 129 100 - 199 mg/dL   Triglycerides 118 0 - 149 mg/dL   HDL 43 >39 mg/dL   VLDL Cholesterol Cal 21 5 - 40 mg/dL   LDL Chol Calc (NIH) 65 0 - 99 mg/dL   Chol/HDL Ratio 3.0 0.0 - 5.0 ratio  Thyroid Panel With TSH  Result Value Ref Range   TSH 1.790 0.450 - 4.500 uIU/mL   T4, Total 8.8 4.5 - 12.0 ug/dL   T3 Uptake Ratio 25 24 - 39 %   Free Thyroxine Index 2.2 1.2 - 4.9  CBC with Differential/Platelet  Result Value Ref Range   WBC 7.3 3.4 - 10.8 x10E3/uL   RBC 5.34 4.14 - 5.80 x10E6/uL   Hemoglobin 15.7 13.0 - 17.7 g/dL   Hematocrit 47.2 37.5 - 51.0 %   MCV 88 79 - 97 fL   MCH 29.4 26.6 - 33.0 pg   MCHC 33.3 31.5 - 35.7 g/dL   RDW 13.3 11.6 - 15.4 %    Platelets 182 150 - 450 x10E3/uL   Neutrophils 53 Not Estab. %   Lymphs 34 Not Estab. %   Monocytes 10 Not Estab. %   Eos 2 Not Estab. %   Basos 1 Not Estab. %   Neutrophils Absolute 3.9 1.4 - 7.0 x10E3/uL   Lymphocytes Absolute 2.5 0.7 - 3.1 x10E3/uL   Monocytes Absolute 0.7 0.1 - 0.9 x10E3/uL   EOS (ABSOLUTE) 0.2 0.0 - 0.4 x10E3/uL   Basophils Absolute 0.1 0.0 - 0.2 x10E3/uL   Immature Granulocytes 0 Not Estab. %   Immature Grans (Abs) 0.0 0.0 - 0.1 x10E3/uL        Current Outpatient Medications:    allopurinol (ZYLOPRIM) 300 MG tablet, Take 1 tablet (300 mg total) by mouth daily., Disp: 90 tablet, Rfl: 0   amitriptyline (ELAVIL) 10 MG tablet, Take 1 tablet (10 mg total) by mouth at bedtime., Disp: 30 tablet, Rfl: 2   Ander Cider Vinegar 600 MG CAPS, Take 600 mg by mouth daily., Disp: , Rfl:    aspirin EC 81 MG tablet, Take 2 tablets (162 mg total) by mouth daily., Disp: 90 tablet, Rfl: 3   atorvastatin (LIPITOR) 80 MG tablet, Take 1 tablet (80 mg total) by mouth daily at 6 PM., Disp: 90 tablet, Rfl: 0   esomeprazole (NEXIUM) 40 MG capsule, Take 1 capsule (40 mg total) by mouth daily before breakfast., Disp: 90 capsule, Rfl: 0   gabapentin (NEURONTIN) 100 MG capsule, Take 1 capsule (100 mg total) by mouth at bedtime., Disp: 30 capsule, Rfl: 1   Liniments (DEEP BLUE RELIEF) GEL, Apply 1 application topically as needed for pain (joints). 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  To have an MRI tomorrow Amitryptiline helps him some helps him sleep better.  Will start neurontin to space out this and amitryptiline.  By at least 2-4 hours Will refer to PT  To fu with neurosurgeron  2. Labs wnl.   3. HTN is on lsartan and metoprolol  HTN :  Continue current meds.  Medication compliance emphasised. pt advised to keep Bp logs. Pt verbalised understanding of the same. Pt to have a low salt diet . Exercise to reach a goal of at least 150 mins a week.  lifestyle modifications explained and pt understands importance of the above.   4.  Hyperlipidemia is on Lipitor 80 mg daily. Chronic continue current medications. recheck FLP, check LFT's work on diet, SE of meds explained to pt. low fat and high fiber diet explained to pt.  Problem List Items Addressed This Visit   None Visit Diagnoses     Sciatica of right side    -  Primary   Relevant Medications   gabapentin (NEURONTIN) 100 MG capsule   Other Relevant Orders   Ambulatory referral to Physical Therapy        Orders Placed This Encounter  Procedures   Ambulatory referral to Physical Therapy     Meds ordered this encounter  Medications   gabapentin (NEURONTIN) 100 MG capsule    Sig: Take 1 capsule (100 mg total) by mouth at bedtime.    Dispense:  30 capsule    Refill:  1     Follow up plan: No follow-ups on file.

## 2020-11-13 DIAGNOSIS — M5416 Radiculopathy, lumbar region: Secondary | ICD-10-CM | POA: Diagnosis not present

## 2020-11-19 ENCOUNTER — Encounter: Payer: Self-pay | Admitting: Physical Therapy

## 2020-11-19 ENCOUNTER — Ambulatory Visit: Payer: Medicare Other | Attending: Internal Medicine | Admitting: Physical Therapy

## 2020-11-19 DIAGNOSIS — M5431 Sciatica, right side: Secondary | ICD-10-CM | POA: Insufficient documentation

## 2020-11-19 DIAGNOSIS — M6281 Muscle weakness (generalized): Secondary | ICD-10-CM | POA: Insufficient documentation

## 2020-11-19 DIAGNOSIS — M5441 Lumbago with sciatica, right side: Secondary | ICD-10-CM | POA: Diagnosis not present

## 2020-11-19 DIAGNOSIS — G8929 Other chronic pain: Secondary | ICD-10-CM | POA: Diagnosis not present

## 2020-11-19 NOTE — Therapy (Signed)
[facility] PHYSICAL AND SPORTS MEDICINE [address] [place], [address] Phone: [phone]   Fax:  [phone]  Physical Therapy Evaluation  Patient Details  Name: Thomas Miller MRN: [id number] Date of Birth: 05-Jan-1951 Referring Provider (PT): Loura Pardon, MD  Encounter Date: 11/19/2020   PT End of Session - 11/19/20 1417     Visit Number 1    Number of Visits 24    Date for PT Re-Evaluation 02/11/21    Authorization Type [employer] MEDICARE reporting period from 11/19/2020    Progress Note Due on Visit 10    PT Start Time 1030    PT Stop Time 1115    PT Time Calculation (min) 45 min    Activity Tolerance Patient tolerated treatment well             Past Medical History:  Diagnosis Date   Advanced care planning/counseling discussion 11/27/2016   Aortic atherosclerosis (HCC) 03/24/2020   Noted on CT 03/01/19   BPH (benign prostatic hyperplasia) 10/25/2014   CAD (coronary artery disease) 08/07/2017   GERD (gastroesophageal reflux disease) 10/25/2014   Gout    Hearing loss    History of 2019 novel coronavirus disease (COVID-19) 03/24/2020   On 02/09/20   History of stroke 10/05/2019   Hyperlipemia 10/25/2014   Hypertension    Insomnia 05/19/2016   Kidney stones    PAF (paroxysmal atrial fibrillation) (HCC) 08/07/2017   Reflux 10/25/2014   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   S/P epidural steroid injection 05/2018    Past Surgical History:  Procedure Laterality Date   BACK SURGERY     CORONARY ARTERY BYPASS GRAFT N/A 07/20/2017   Procedure: CORONARY ARTERY BYPASS GRAFTING (CABG) times four using left internal mammary artery to LAD and endoscopically harvested right saphenous vein graft to distal circumflex, intermediate, and PD;  Surgeon: [name], MD;  Location: [facility] OR;  Service: Open Heart Surgery;  Laterality: N/A;   EPIDURAL BLOCK INJECTION  05/2018   L3injections    EYE SURGERY Bilateral 2013   FOOT SURGERY Bilateral     ingrown toenails   GALLBLADDER SURGERY     LEFT HEART CATH AND CORONARY ANGIOGRAPHY N/A 07/17/2017   Procedure: LEFT HEART CATH AND CORONARY ANGIOGRAPHY;  Surgeon: Yvonne Kendall, MD;  Location: [facility] INVASIVE CV LAB;  Service: Cardiovascular;  Laterality: N/A;   SHOULDER SURGERY Right    SPINE SURGERY      There were no vitals filed for this visit.    Subjective Assessment - 11/19/20 1038     Subjective Patient states he his having pain from the right low back, right glute/hip and down his lateral R leg sometimes down to his toes that started for no apparent reason about a year ago.. Currently on steroids that makes him feel a bit funny but is helping with the pain. He is on his second steroid pack. The first one helped but then the pain came back extremely sharp. Most of his pain is at night and when he is setting. He walks about 3-4 miles per day, 12-16K per day. Plays golf on Tuesday. 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 States this started over a year ago for no apparent reason. He has had 2 spine surgeries over the last 3 years. Surgery for a herniated disc for the left side and it doesn't bother him any more. He did not have a fusion. States they did not remove bone. His right  side had never bothered him prior to a year ago. He goes to bed with gabapentin and amitriptyline. It wakes him at ~ 3am with his R hip and leg on fire, burning sensation. Has a nerve conduction test next. Does some stretches and eliptical (more comforable). or bicycle (less comfortable). Left eye has specs.    Pertinent History Patient is a 70 y.o. male who presents to outpatient physical therapy with a referral for medical diagnosis sciatica of right side. This patient's chief complaints consist of right sided low back, hip, thigh and leg pain/paresthesia leading to the following functional deficits: creates a problem for taking care of everything at the home including his wife who has dementia (bathing,  dressing, meals, etc), sleeping, sitting, traveling, going up stairs, walking up a steep grade.  Relevant past medical history and comorbidities include CABG x4 (2019), flashes in L eye, hx TIA (12/2019), R shoulder surgery, paroxysmal atrial fib (takes metoprolol to slow heart down), gout, GERD, BPH, CAD, kidney stones, HTN, insomnia, hearing loss.  Patient denies hx of cancer, seizures, changes in bowel or bladder problems, new onset stumbling or dropping things, known weak bones/osteoporosis.    Limitations Sitting;Standing;House hold activities;Other (comment)   : creates a problem for taking care of everything at the home including his wife who has dementia (bathing, dressing, meals, etc), sleeping, sitting, traveling, going up stairs, walking up a steep grade.   How long can you sit comfortably? not more than 30 min    Diagnostic tests Note from Verlin Dike NP at [facility] 11/13/2020:  Reviewd lumbar MRI - overall his MRI looks pretty good. He does have some foraminal stenosis on the right at L3. Reviewed four view lumbar  x-ray-no dynamic instability noted ion flexion-extnesion.   Impression lumbar radiculopathy vs peroneal neuropathy, vs hip pathology?  Ordered  R LE EMG/NCV to be performed.    Patient Stated Goals "to get a little less pain"    Currently in Pain? Yes    Pain Score 1    W: 10/10 (usually at night); B: 1/10   Pain Location Back   right low back, right glute/hip and down his lateral R leg sometimes down to his toes.   Pain Orientation Right    Pain Descriptors / Indicators Sharp;Other (Comment);Burning;Numbness   feels like it might give way   Pain Radiating Towards right lateral leg to toes    Pain Onset More than a month ago    Pain Frequency Constant   Constant at right glute and lateral calf, and intermittant elsewhere   Aggravating Factors  sleeping, sitting    Pain Relieving Factors walking about 1000 steps, ice, heat, steroids really help (temporary),  morning stretches, gabapentin, amitriptyline    Effect of Pain on Daily Activities Functional Limitations: creates a problem for taking care of everything at the home including his wife who has dementia (bathing, dressing, meals, etc), sleeping, sitting, traveling, going up stairs, walking up a steep grade.               [facility] PT Assessment - 11/19/20 1044       Assessment   Medical Diagnosis sciatica of right side    Referring Provider (PT) Vigg, Avanti, MD    Onset Date/Surgical Date --   about a year ago   Hand Dominance Right    Next MD Visit --   will Go to [facility], Dr. Harrison Mons on 11/25/20   Prior Therapy none for this problem prior to  current episode of care      Precautions   Precautions None      Balance Screen   Has the patient fallen in the past 6 months No    Has the patient had a decrease in activity level because of a fear of falling?  No    Is the patient reluctant to leave their home because of a fear of falling?  No      Home Environment   Living Environment --   no concerns about getting around home safely     Prior Function   Level of Independence Independent    Vocation Retired    Leisure alks about 3-4 miles per day, 12-16K per day. Plays golf on Tuesday. He is the caregiver for his wife who has dementia      Cognition   Overall Cognitive Status Within Functional Limits for tasks assessed              OBJECTIVE  SELF- REPORTED FUNCTION FOTO score: 60/100 (lumbar spine questionnaire)  OBSERVATION/INSPECTION Posture Posture (seated): shifts to left side with prolonged sitting.  Posture (standing): decreased lumbar lordosis, shifts weight off R LE. Anthropometrics Tremor: none Body composition: BMI 30 Muscle bulk: WFL Edema: none Functional Mobility Bed mobility: supine <> sit and rolling Mod I.  Transfers: sit <> stand I  Gait: grossly WFL for household and short community ambulation. More detailed gait analysis deferred to  later date as needed.   SPINE MOTION Lumbar Spine AROM *Indicates pain Flexion: fingers to mid shin, felt tight, no increase in pain, mild tremor Extension: 25% or less, increased pain in R buttock, decreased to better than before when he came back up.  Side Flexion:   R 75%  L 75% Rotation:  R 50% L 50%  NEUROLOGICAL Upper Motor Neuron Screen Hoffman's negative bilaterally and Clonus (ankle) negative on right, slight catch on left (no beating).  Dermatomes L2-S2 appears equal and intact to light touch Deep Tendon Reflexes R/L  1+/2+ Quadriceps reflex (L4) 0+/2+ Achilles reflex (S1)  PERIPHERAL JOINT MOTION (in degrees) Passive Range of Motion (PROM) B LE grossly WFL for basic mobility except the following:  B hip extension ~ 15 degrees. L hip end range pain at lateral hip with IR and ER, and low back pain with flexion,  B hip IR restricted to approx 10-20 degrees.  MUSCLE PERFORMANCE (MMT):  *Indicates pain 11/19/20 Date Date  Joint/Motion R/L R/L R/L  Hip     Abduction 4+/4 / /  Knee     Extension (L3) 5/5 / /  Flexion (S2) 5/5 / /  Ankle/Foot     Dorsiflexion (L4) 5/5 / /  Great toe extension (L5) 4+/4 / /  Eversion (S1) 4+/4+ / /  Plantarflexion (S1) 4+/ / /  can heel and toe walk bilaterally without UE support.   SPECIAL TESTS: LOWER LIMB NEORODYNAMICS Straight Leg Raise (Sciatic nerve)  R  = positive for concordant pain  L  = positive for posterior knee strain (not concordant).   Slump Test (entire nervous system)  R = negative   L = negative   HIP SPECIAL TESTS FABER: R = positive for slight non-concordant groin pain, L = positive for lateral hip pain SCOUR: R = negative FADDIR: R = negative, L = negative.  ACCESSORY MOTION:  Reproduction of concordant R calf pain with CPA to L4-5 region  PALPATION: TTP at right glute med/min, posterior hip/glute max region, and QR  REPEATED MOTIONS  TESTING: - Standing lumbar extension, 1x10 (increase, better,  at R buttock, increased coccyx pain) - prone press up 1x10, resolved buttock pain   Objective measurements completed on examination: See above findings.    TREATMENT:  Therapeutic exercise: to centralize symptoms and improve ROM, strength, muscular endurance, and activity tolerance required for successful completion of functional activities.  - standing lumbar extension 2x10 - prone press up, 1x10 - Education on diagnosis, prognosis, POC, anatomy and physiology of current condition.  - Education on HEP including handout and education about posture.   Pt required multimodal cuing for proper technique and to facilitate improved neuromuscular control, strength, range of motion, and functional ability resulting in improved performance and form.  HOME EXERCISE PROGRAM Access Code: [email or web address] URL: [email or web address] Date: 11/19/2020 Prepared by: Norton Blizzard  Exercises Seated Correct Posture Prone Press Up - 4 x daily - 10-15 reps - 1 second hold Standing Lumbar Extension - 4 x daily - 1 sets - 10-15 reps - 1 second hold   Exercise purpose/form. Self management techniques. Education on diagnosis, prognosis, POC, anatomy and physiology of current condition Education on HEP including handout         PT Education - 11/19/20 1417     Education Details Exercise purpose/form. Self management techniques. Education on diagnosis, prognosis, POC, anatomy and physiology of current condition Education on HEP including handout    Person(s) Educated Patient    Methods Explanation;Demonstration;Tactile cues;Verbal cues;Handout    Comprehension Verbalized understanding;Returned demonstration;Verbal cues required;Tactile cues required;Need further instruction              PT Short Term Goals - 11/19/20 1426       PT SHORT TERM GOAL #1   Title Be independent with initial home exercise program for self-management of symptoms.    Baseline Initial HEP provied at IE  (11/19/2020);    Time 2    Period Weeks    Status New    Target Date 12/03/20               PT Long Term Goals - 11/19/20 1426       PT LONG TERM GOAL #1   Title Be independent with a long-term home exercise program for self-management of symptoms.    Baseline Initial HEP provided at IE (11/19/2020);    Time 12    Period Weeks    Status New   TARGET DATE FOR ALL LONG TERM GOALS: 02/11/2021     PT LONG TERM GOAL #2   Title Demonstrate improved FOTO to equal or greater than 65 by visit #12 to demonstrate improvement in overall condition and self-reported functional ability.    Baseline 60 (11/19/2020);    Time 12    Period Weeks    Status New      PT LONG TERM GOAL #3   Title Pateint will demonstrate equal or greater than 4+/5 hip strength in bilateral hip abduciton to improve lumbopelvic stability during funcitonal activities such as climbing stairs.    Baseline R = 4+/5, L = 4/5 (11/19/2020);    Time 12    Period Weeks    Status New      PT LONG TERM GOAL #4   Title Reduce pain with functional activities to equal or less than 1/10 to allow patient to complete usual activities including sleeping, caregiving, sitting, traveling with less difficulty.    Baseline up to 10/10 (11/19/2020);    Time 12    Period Weeks    Status  New      PT LONG TERM GOAL #5   Title Complete community, work and/or recreational activities without limitation due to current condition.    Baseline Functional Limitations: creates a problem for taking care of everything at the home including his wife who has dementia (bathing, dressing, meals, etc), sleeping, sitting, traveling, going up stairs, walking up a steep grade (11/19/2020);    Time 12    Period Weeks    Status New      Additional Long Term Goals   Additional Long Term Goals Yes      PT LONG TERM GOAL #6   Title Patient will improve lumbar extension to equal or greater than 50% to improve mobility for functional activity.    Baseline  Extension: 25% or less (11/19/2020);    Time 12    Period Weeks    Status New                    Plan - 11/19/20 1423     Clinical Impression Statement Patient is a 70 y.o. male referred to outpatient physical therapy with a medical diagnosis of sciatica of right side who presents with signs and symptoms consistent with low back pain with radiculopathy. MDT provisional classification with lumbar derangement syndrome with extension preference. Patient's symptoms appear to resolve/improve with repeated extension of the lumbar spine and can be reproduced with R SLR and CPA to lowest segments of lumbar spine, strongly suggesting a lumbar source of nociception. Screen for R hip pathology suggests a mild intra-articular problem (groin pain with FABER test) but was not concordant and unlikely to be strong contributor to patient's concordant pain.  Patient presents with significant pain, ROM, posture, muscle performance (strength/endurance/power), joint stiffness, muscle tension, and activity tolerance  impairments that are limiting ability to complete usual activities including taking care of everything at the home including his wife who has dementia (bathing, dressing, meals, etc), sleeping, sitting, traveling, going up stairs, walking up a steep grade without difficulty. Patient will benefit from skilled physical therapy intervention to address current body structure impairments and activity limitations to improve function and work towards goals set in current POC in order to return to prior level of function or maximal functional improvement.    Personal Factors and Comorbidities Age;Past/Current Experience;Time since onset of injury/illness/exacerbation    Examination-Activity Limitations Stairs;Caring for Others;Sit;Sleep    Examination-Participation Restrictions Cleaning;Laundry;Shop;Community Activity;Meal Prep;Interpersonal Relationship;Other   creates a problem for taking care of everything  at the home including his wife who has dementia (bathing, dressing, meals, etc), sleeping, sitting, traveling, going up stairs, walking up a steep grade.   Stability/Clinical Decision Making Stable/Uncomplicated    Clinical Decision Making Low    Rehab Potential Good    PT Frequency 2x / week    PT Duration 12 weeks    PT Treatment/Interventions ADLs/Self Care Home Management;Cryotherapy;Moist Heat;Electrical Stimulation;Gait training;Stair training;Functional mobility training;Neuromuscular re-education;Balance training;Therapeutic exercise;Therapeutic activities;Patient/family education;Manual techniques;Dry needling;Passive range of motion;Joint Manipulations    PT Next Visit Plan update HEP as appropriate, specific exercise, LE/core/functional strengthening, manual therapy as needed    PT Home Exercise Plan Medbridge Access Code: [email or web address]    Consulted and Agree with Plan of Care Patient             Patient will benefit from skilled therapeutic intervention in order to improve the following deficits and impairments:  Improper body mechanics, Pain, Postural dysfunction, Increased muscle spasms, Decreased mobility, Decreased activity tolerance, Decreased endurance, Decreased range  of motion, Decreased strength, Hypomobility, Impaired perceived functional ability, Impaired flexibility  Visit Diagnosis: Chronic right-sided low back pain with right-sided sciatica  Muscle weakness (generalized)     Problem List Patient Active Problem List   Diagnosis Date Noted   Hearing loss    Kidney stones    Aortic atherosclerosis (HCC) 03/24/2020   History of 2019 novel coronavirus disease (COVID-19) 03/24/2020   History of stroke 10/05/2019   S/P epidural steroid injection 05/2018   PAF (paroxysmal atrial fibrillation) (HCC) 08/07/2017   CAD (coronary artery disease) 08/07/2017   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 Ilsa Iha, PT, DPT 11/19/20, 2:31 PM   [facility] PHYSICAL AND SPORTS MEDICINE [address] [place], [address] Phone: [phone]   Fax:  [phone]  Name: AVIAN KONIGSBERG MRN: [id number] Date of Birth: [birth year]

## 2020-11-21 ENCOUNTER — Telehealth: Payer: Self-pay | Admitting: Physical Therapy

## 2020-11-21 ENCOUNTER — Ambulatory Visit: Payer: Medicare Other | Admitting: Physical Therapy

## 2020-11-21 ENCOUNTER — Encounter: Payer: Self-pay | Admitting: Physical Therapy

## 2020-11-21 ENCOUNTER — Ambulatory Visit: Payer: Medicare Other | Admitting: Internal Medicine

## 2020-11-21 ENCOUNTER — Encounter: Payer: Self-pay | Admitting: Internal Medicine

## 2020-11-21 ENCOUNTER — Other Ambulatory Visit: Payer: Self-pay

## 2020-11-21 ENCOUNTER — Ambulatory Visit: Payer: Self-pay

## 2020-11-21 VITALS — BP 124/84 | HR 58

## 2020-11-21 VITALS — BP 125/80 | HR 60 | Temp 98.6°F | Ht 68.9 in | Wt 206.2 lb

## 2020-11-21 DIAGNOSIS — M5441 Lumbago with sciatica, right side: Secondary | ICD-10-CM | POA: Diagnosis not present

## 2020-11-21 DIAGNOSIS — G8929 Other chronic pain: Secondary | ICD-10-CM | POA: Diagnosis not present

## 2020-11-21 DIAGNOSIS — M5431 Sciatica, right side: Secondary | ICD-10-CM | POA: Diagnosis not present

## 2020-11-21 DIAGNOSIS — M545 Low back pain, unspecified: Secondary | ICD-10-CM

## 2020-11-21 DIAGNOSIS — M6281 Muscle weakness (generalized): Secondary | ICD-10-CM

## 2020-11-21 HISTORY — DX: Low back pain, unspecified: M54.50

## 2020-11-21 NOTE — Telephone Encounter (Signed)
Sarah, PT, reports today pt. Reported to her that 2 days ago pt. Was doing PT exercises at home and had headache and blurred vision to left eye that lasted 2 hours. Today at therapy pt. Did not have symptoms. Vital signs at therapy - BP 124/84, pulse 58. O2 saturation 95%. States she will hold therapy until she hears from PCP.

## 2020-11-21 NOTE — Therapy (Signed)
[facility] [facility] PHYSICAL AND SPORTS MEDICINE [address] [place], [address] Phone: [phone]   Fax:  [phone]  Physical Therapy Treatment  Patient Details  Name: Thomas Miller MRN: [id number] Date of Birth: 03/10/1950 Referring Provider (PT): Loura Pardon, MD   Encounter Date: 11/21/2020   PT End of Session - 11/21/20 1133     Visit Number 2    Number of Visits 24    Date for PT Re-Evaluation 02/11/21    Authorization Type [employer] MEDICARE reporting period from 11/19/2020    Progress Note Due on Visit 10    PT Start Time 1035    PT Stop Time 1058    PT Time Calculation (min) 23 min    Activity Tolerance Patient tolerated treatment well             Past Medical History:  Diagnosis Date   Advanced care planning/counseling discussion 11/27/2016   Aortic atherosclerosis (HCC) 03/24/2020   Noted on CT 03/01/19   BPH (benign prostatic hyperplasia) 10/25/2014   CAD (coronary artery disease) 08/07/2017   GERD (gastroesophageal reflux disease) 10/25/2014   Gout    Hearing loss    History of 2019 novel coronavirus disease (COVID-19) 03/24/2020   On 02/09/20   History of stroke 10/05/2019   Hyperlipemia 10/25/2014   Hypertension    Insomnia 05/19/2016   Kidney stones    PAF (paroxysmal atrial fibrillation) (HCC) 08/07/2017   Reflux 10/25/2014   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   S/P epidural steroid injection 05/2018    Past Surgical History:  Procedure Laterality Date   BACK SURGERY     CORONARY ARTERY BYPASS GRAFT N/A 07/20/2017   Procedure: CORONARY ARTERY BYPASS GRAFTING (CABG) times four using left internal mammary artery to LAD and endoscopically harvested right saphenous vein graft to distal circumflex, intermediate, and PD;  Surgeon: [name], MD;  Location: [facility] OR;  Service: Open Heart Surgery;  Laterality: N/A;   EPIDURAL BLOCK INJECTION  05/2018   L3injections    EYE SURGERY Bilateral 2013   FOOT SURGERY Bilateral     ingrown toenails   GALLBLADDER SURGERY     LEFT HEART CATH AND CORONARY ANGIOGRAPHY N/A 07/17/2017   Procedure: LEFT HEART CATH AND CORONARY ANGIOGRAPHY;  Surgeon: Yvonne Kendall, MD;  Location: [facility] INVASIVE CV LAB;  Service: Cardiovascular;  Laterality: N/A;   SHOULDER SURGERY Right    SPINE SURGERY      Vitals:   11/21/20 1041  BP: 124/84  Pulse: (!) 58  SpO2: 95%     Subjective Assessment - 11/21/20 1041     Subjective Patient reports his hip is much better but his calf is burning some. He slept a little better last night. However, when he was doing prone press up at home he got a headache that lasted 2 hours and he had blurring in his left eye that made him think he had a TIA, so he took 2 aspirin as instructed by his PCP in the past. This is similar symptoms he has had previously when he had a suspected TIA. He reports his symptoms cleared and he feels ok now. He feels he cannot exert himself so much as doing the press ups. He did find that he was able to do the standing extensions over a counter more effectively than without the back support. Reports pain is 2/10 in the right lateral calf and a little pain in the buttock. He played golf yesterday with no  problem and he does warm ups before that.    Pertinent History Patient is a 70 y.o. male who presents to outpatient physical therapy with a referral for medical diagnosis sciatica of right side. This patient's chief complaints consist of right sided low back, hip, thigh and leg pain/paresthesia leading to the following functional deficits: creates a problem for taking care of everything at the home including his wife who has dementia (bathing, dressing, meals, etc), sleeping, sitting, traveling, going up stairs, walking up a steep grade.  Relevant past medical history and comorbidities include CABG x4 (2019), flashes in L eye, hx TIA (12/2019), R shoulder surgery, paroxysmal atrial fib (takes metoprolol to slow heart down), gout, GERD,  BPH, CAD, kidney stones, HTN, insomnia, hearing loss.  Patient denies hx of cancer, seizures, changes in bowel or bladder problems, new onset stumbling or dropping things, known weak bones/osteoporosis.    Limitations Sitting;Standing;House hold activities;Other (comment)   : creates a problem for taking care of everything at the home including his wife who has dementia (bathing, dressing, meals, etc), sleeping, sitting, traveling, going up stairs, walking up a steep grade.   How long can you sit comfortably? not more than 30 min    Diagnostic tests Note from Verlin Dike NP at [facility] 11/13/2020:  Reviewd lumbar MRI - overall his MRI looks pretty good. He does have some foraminal stenosis on the right at L3. Reviewed four view lumbar  x-ray-no dynamic instability noted ion flexion-extnesion.   Impression lumbar radiculopathy vs peroneal neuropathy, vs hip pathology?  Ordered  R LE EMG/NCV to be performed.    Patient Stated Goals "to get a little less pain"    Currently in Pain? Yes    Pain Score 2     Pain Onset More than a month ago            OBJECTIVE  BP 124/84 mmHg HR 58 bpm SpO2 95%  TREATMENT:  Therapeutic exercise: to centralize symptoms and improve ROM, strength, muscular endurance, and activity tolerance required for successful completion of functional activities.  - standing lumbar extension over table to assess form and safety, 1x10, advised patient not to hold so long and to breathe while performing exercises. Cued to place hands on table, but patient did not.  - educated on medical significance of TIA and plan to contact PCP and hold PT until PCP clears patient.    Pt required multimodal cuing for proper technique and to facilitate improved neuromuscular control, strength, range of motion, and functional ability resulting in improved performance and form.   HOME EXERCISE PROGRAM Access Code: [email or web address] URL: [email or web address] Date:  11/19/2020 Prepared by: Norton Blizzard   Exercises Seated Correct Posture Prone Press Up - 4 x daily - 10-15 reps - 1 second hold Standing Lumbar Extension - 4 x daily - 1 sets - 10-15 reps - 1 second hold     PT Education - 11/21/20 1748     Education Details Exercise purpose/form. Self management techniques. reccomendations on holding PT to get medical clearance after possible TIA    Person(s) Educated Patient    Methods Explanation;Demonstration;Tactile cues;Verbal cues    Comprehension Verbalized understanding;Returned demonstration;Verbal cues required;Tactile cues required;Need further instruction              PT Short Term Goals - 11/21/20 1748       PT SHORT TERM GOAL #1   Title Be independent with initial home exercise program for self-management of symptoms.    Baseline  Initial HEP provied at IE (11/19/2020);    Time 2    Period Weeks    Status Achieved    Target Date 12/03/20               PT Long Term Goals - 11/19/20 1426       PT LONG TERM GOAL #1   Title Be independent with a long-term home exercise program for self-management of symptoms.    Baseline Initial HEP provided at IE (11/19/2020);    Time 12    Period Weeks    Status New   TARGET DATE FOR ALL LONG TERM GOALS: 02/11/2021     PT LONG TERM GOAL #2   Title Demonstrate improved FOTO to equal or greater than 65 by visit #12 to demonstrate improvement in overall condition and self-reported functional ability.    Baseline 60 (11/19/2020);    Time 12    Period Weeks    Status New      PT LONG TERM GOAL #3   Title Pateint will demonstrate equal or greater than 4+/5 hip strength in bilateral hip abduciton to improve lumbopelvic stability during funcitonal activities such as climbing stairs.    Baseline R = 4+/5, L = 4/5 (11/19/2020);    Time 12    Period Weeks    Status New      PT LONG TERM GOAL #4   Title Reduce pain with functional activities to equal or less than 1/10 to allow patient to  complete usual activities including sleeping, caregiving, sitting, traveling with less difficulty.    Baseline up to 10/10 (11/19/2020);    Time 12    Period Weeks    Status New      PT LONG TERM GOAL #5   Title Complete community, work and/or recreational activities without limitation due to current condition.    Baseline Functional Limitations: creates a problem for taking care of everything at the home including his wife who has dementia (bathing, dressing, meals, etc), sleeping, sitting, traveling, going up stairs, walking up a steep grade (11/19/2020);    Time 12    Period Weeks    Status New      Additional Long Term Goals   Additional Long Term Goals Yes      PT LONG TERM GOAL #6   Title Patient will improve lumbar extension to equal or greater than 50% to improve mobility for functional activity.    Baseline Extension: 25% or less (11/19/2020);    Time 12    Period Weeks    Status New                   Plan - 11/21/20 1747     Clinical Impression Statement Pateint arrives today reporting symptoms with prone press ups during HEP similar to prior presumed TIA last year (per patient). Patient thinks he may have had another TIA due to exertion with that specific exercise. Review of chart shows he has paroxysmal atrial fibrillation and takes only aspirin as an anticoagulant. Vitals today WFL and consistent with what patient reports as normal for him. Does not have any current symptoms that he associated with TIA (sudden headache for 2 hours + blurring of left eye). Advised patient it concerning if he is having TIAs and would like him to consult with PCP and get clearance to continue PT. Also advised to stop the prone press up exercise (as he had already done). Decided to hold further PT today until he is  cleared to return by PCP. Called PCP and spoke with triage nurse who planned to call patient to see if he could come in to be seen. Patient does present with good improvements  in chief complaint and concordant pain with extension exercises since PT initial evaluation and has good prognosis for further improvement with continued PT. Once he is cleared medically, patient would benefit from continued management of limiting condition by skilled physical therapist to address remaining impairments and functional limitations to work towards stated goals and return to PLOF or maximal functional independence.    Personal Factors and Comorbidities Age;Past/Current Experience;Time since onset of injury/illness/exacerbation    Examination-Activity Limitations Stairs;Caring for Others;Sit;Sleep    Examination-Participation Restrictions Cleaning;Laundry;Shop;Community Activity;Meal Prep;Interpersonal Relationship;Other   creates a problem for taking care of everything at the home including his wife who has dementia (bathing, dressing, meals, etc), sleeping, sitting, traveling, going up stairs, walking up a steep grade.   Stability/Clinical Decision Making Stable/Uncomplicated    Rehab Potential Good    PT Frequency 2x / week    PT Duration 12 weeks    PT Treatment/Interventions ADLs/Self Care Home Management;Cryotherapy;Moist Heat;Electrical Stimulation;Gait training;Stair training;Functional mobility training;Neuromuscular re-education;Balance training;Therapeutic exercise;Therapeutic activities;Patient/family education;Manual techniques;Dry needling;Passive range of motion;Joint Manipulations    PT Next Visit Plan Once cleared medically, update HEP as appropriate, specific exercise, LE/core/functional strengthening, manual therapy as needed    PT Home Exercise Plan Medbridge Access Code: [email or web address]    Consulted and Agree with Plan of Care Patient             Patient will benefit from skilled therapeutic intervention in order to improve the following deficits and impairments:  Improper body mechanics, Pain, Postural dysfunction, Increased muscle spasms, Decreased mobility, Decreased  activity tolerance, Decreased endurance, Decreased range of motion, Decreased strength, Hypomobility, Impaired perceived functional ability, Impaired flexibility  Visit Diagnosis: Chronic right-sided low back pain with right-sided sciatica  Muscle weakness (generalized)     Problem List Patient Active Problem List   Diagnosis Date Noted   Chronic low back pain without sciatica 11/21/2020   Hearing loss    Kidney stones    Aortic atherosclerosis (HCC) 03/24/2020   History of 2019 novel coronavirus disease (COVID-19) 03/24/2020   History of stroke 10/05/2019   S/P epidural steroid injection 05/2018   PAF (paroxysmal atrial fibrillation) (HCC) 08/07/2017   CAD (coronary artery disease) 08/07/2017   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 Ilsa Iha, PT, DPT 11/21/20, 5:49 PM   [place] [facility] PHYSICAL AND SPORTS MEDICINE [address] [place], [address] Phone: [phone]   Fax:  [phone]  Name: REINHARD SCHACK MRN: [id number] Date of Birth: [birth year]

## 2020-11-21 NOTE — Telephone Encounter (Signed)
Called PCP office and spoke to [place]. Descried patient's symptoms (headache and blurry left eye for 2 hours, took 2 aspirin and symptoms resolved, this is similar to prior symptoms he has had that was thought to be TIA) and my concerns and requested clearance from MD to continue PT.   Spoke to triage nurse, Verlon Au. Reported symptoms, vitals taken today, and concerns about patients medical history (PAF (paroxysmal atrial fibrillation, and not aware of him being on any anticoagulant except aspirin). She said she would call the patient and see they could get him seen. Agrees with holding PT until cleared by PCP office.   Luretha Murphy. Ilsa Iha, PT, DPT 11/21/20, 11:15 AM

## 2020-11-21 NOTE — Progress Notes (Signed)
BP 125/80   Pulse 60   Temp 98.6 F (37 C) (Oral)   Ht 5' 8.9" (1.75 m)   Wt 206 lb 3.2 oz (93.5 kg)   BMI 30.54 kg/m    Subjective:    Patient ID: Thomas Miller, male    DOB: 15-Oct-1950, 70 y.o.   MRN: [id number]  Chief Complaint  Patient presents with   Blurred Vision    With a headache, was doing a push up (exercise from PT) and got a sharp pain in his head and blurred vision on Monday. Took Aspirin and a nap, headache and blurred vision went away. Patient states that his PT wants him evaluated for this prior to anymore PT    HPI: Thomas Miller is a 70 y.o. male  Went to PT for his back pain and went home ate a peanut butter sandwich and started doing his exercises that were recommended. Push ups made him feel headache blurred vision in left eye. Took aspirin took 325 aspirin, took a nap and fell better.   Went this morning and the PT wont see him , back pain better and pain in buttock is better and not as much on the thigh.     Back Pain This is a chronic (better with Pt as above.) problem. The problem has been waxing and waning since onset. The pain is moderate. Associated symptoms include numbness and tingling. Pertinent negatives include no abdominal pain, bladder incontinence, chest pain, dysuria, fever, headaches, leg pain, paresis, paresthesias, pelvic pain, perianal numbness, weakness or weight loss.   Chief Complaint  Patient presents with   Blurred Vision    With a headache, was doing a push up (exercise from PT) and got a sharp pain in his head and blurred vision on Monday. Took Aspirin and a nap, headache and blurred vision went away. Patient states that his PT wants him evaluated for this prior to anymore PT    Relevant past medical, surgical, family and social history reviewed and updated as indicated. Interim medical history since our last visit reviewed. Allergies and medications reviewed and updated.  Review of Systems  Constitutional:  Negative for  fever and weight loss.  Cardiovascular:  Negative for chest pain.  Gastrointestinal:  Negative for abdominal pain.  Genitourinary:  Negative for bladder incontinence, dysuria and pelvic pain.  Musculoskeletal:  Positive for back pain.  Neurological:  Positive for tingling and numbness. Negative for weakness, headaches and paresthesias.   Per HPI unless specifically indicated above     Objective:    BP 125/80   Pulse 60   Temp 98.6 F (37 C) (Oral)   Ht 5' 8.9" (1.75 m)   Wt 206 lb 3.2 oz (93.5 kg)   BMI 30.54 kg/m   Wt Readings from Last 3 Encounters:  11/21/20 206 lb 3.2 oz (93.5 kg)  11/08/20 205 lb 9.6 oz (93.3 kg)  11/05/20 205 lb (93 kg)    Physical Exam  Results for orders placed or performed in visit on 10/03/20  CMP14+EGFR  Result Value Ref Range   Glucose 85 65 - 99 mg/dL   BUN 17 8 - 27 mg/dL   Creatinine, Ser 1.00 0.76 - 1.27 mg/dL   eGFR 80 >59 mL/min/1.73   BUN/Creatinine Ratio 17 10 - 24   Sodium 141 134 - 144 mmol/L   Potassium 4.4 3.5 - 5.2 mmol/L   Chloride 102 96 - 106 mmol/L   CO2 22 20 - 29 mmol/L  Calcium 9.8 8.6 - 10.2 mg/dL   Total Protein 6.7 6.0 - 8.5 g/dL   Albumin 4.8 (H) 3.7 - 4.7 g/dL   Globulin, Total 1.9 1.5 - 4.5 g/dL   Albumin/Globulin Ratio 2.5 (H) 1.2 - 2.2   Bilirubin Total 0.8 0.0 - 1.2 mg/dL   Alkaline Phosphatase 96 44 - 121 IU/L   AST 25 0 - 40 IU/L   ALT 23 0 - 44 IU/L  Lipid panel  Result Value Ref Range   Cholesterol, Total 129 100 - 199 mg/dL   Triglycerides 118 0 - 149 mg/dL   HDL 43 >39 mg/dL   VLDL Cholesterol Cal 21 5 - 40 mg/dL   LDL Chol Calc (NIH) 65 0 - 99 mg/dL   Chol/HDL Ratio 3.0 0.0 - 5.0 ratio  Thyroid Panel With TSH  Result Value Ref Range   TSH 1.790 0.450 - 4.500 uIU/mL   T4, Total 8.8 4.5 - 12.0 ug/dL   T3 Uptake Ratio 25 24 - 39 %   Free Thyroxine Index 2.2 1.2 - 4.9  CBC with Differential/Platelet  Result Value Ref Range   WBC 7.3 3.4 - 10.8 x10E3/uL   RBC 5.34 4.14 - 5.80 x10E6/uL    Hemoglobin 15.7 13.0 - 17.7 g/dL   Hematocrit 47.2 37.5 - 51.0 %   MCV 88 79 - 97 fL   MCH 29.4 26.6 - 33.0 pg   MCHC 33.3 31.5 - 35.7 g/dL   RDW 13.3 11.6 - 15.4 %   Platelets 182 150 - 450 x10E3/uL   Neutrophils 53 Not Estab. %   Lymphs 34 Not Estab. %   Monocytes 10 Not Estab. %   Eos 2 Not Estab. %   Basos 1 Not Estab. %   Neutrophils Absolute 3.9 1.4 - 7.0 x10E3/uL   Lymphocytes Absolute 2.5 0.7 - 3.1 x10E3/uL   Monocytes Absolute 0.7 0.1 - 0.9 x10E3/uL   EOS (ABSOLUTE) 0.2 0.0 - 0.4 x10E3/uL   Basophils Absolute 0.1 0.0 - 0.2 x10E3/uL   Immature Granulocytes 0 Not Estab. %   Immature Grans (Abs) 0.0 0.0 - 0.1 x10E3/uL        Current Outpatient Medications:    allopurinol (ZYLOPRIM) 300 MG tablet, Take 1 tablet (300 mg total) by mouth daily., Disp: 90 tablet, Rfl: 0   amitriptyline (ELAVIL) 10 MG tablet, Take 1 tablet (10 mg total) by mouth at bedtime., Disp: 30 tablet, Rfl: 2   Cerone Cider Vinegar 600 MG CAPS, Take 600 mg by mouth daily., Disp: , Rfl:    aspirin EC 81 MG tablet, Take 2 tablets (162 mg total) by mouth daily., Disp: 90 tablet, Rfl: 3   atorvastatin (LIPITOR) 80 MG tablet, Take 1 tablet (80 mg total) by mouth daily at 6 PM., Disp: 90 tablet, Rfl: 0   esomeprazole (NEXIUM) 40 MG capsule, Take 1 capsule (40 mg total) by mouth daily before breakfast., Disp: 90 capsule, Rfl: 0   gabapentin (NEURONTIN) 100 MG capsule, Take 1 capsule (100 mg total) by mouth at bedtime., Disp: 30 capsule, Rfl: 1   Liniments (DEEP BLUE RELIEF) GEL, Apply 1 application topically as needed for pain (joints). In joints, Disp: , Rfl:    losartan (COZAAR) 25 MG tablet, Take 1 tablet (25 mg total) by mouth daily., Disp: 90 tablet, Rfl: 0   methylPREDNISolone (MEDROL DOSEPAK) 4 MG TBPK tablet, Take by mouth., Disp: , Rfl:    metoprolol tartrate (LOPRESSOR) 25 MG tablet, Take 1 tablet (25 mg total) by mouth  2 (two) times daily., Disp: 180 tablet, Rfl: 0   nitroGLYCERIN (NITROSTAT) 0.4 MG SL  tablet, Place 1 tablet (0.4 mg total) under the tongue every 5 (five) minutes as needed for chest pain., Disp: 25 tablet, Rfl: 6   VITAMIN D, CHOLECALCIFEROL, PO, Take 2,000 mg by mouth daily., Disp: , Rfl:     Assessment & Plan:  Scaitica / neuropathy is better with PT -is on neurontin and amitriptyline spacing then out like suggested.  - Advised NOT to do push ups or anyt aggressive / strenuous back exercises, ok to do streches and mild exercise for back as pain is better with PT. Plays golf twice a week. Is active.  Ok to get PT and exercise at home twice a day.  Does his elliptical and rides his bike.   2. Chest pain : hsa muscle pain per cards. To clarify - with Cards about chest pain when this happens.  Has asa for such and NG   Problem List Items Addressed This Visit       Other   Chronic low back pain without sciatica - Primary     No orders of the defined types were placed in this encounter.    No orders of the defined types were placed in this encounter.    Follow up plan: No follow-ups on file.

## 2020-11-21 NOTE — Telephone Encounter (Signed)
Pt. Reports Monday he was exercising at home and developed a headache and blurred vision to left eye that lasted 2 hours. No symptoms since then. States he had a TIA 1 year ago. Appointment made for today. No symptoms today. Instructed to go to ED if symptoms come back. Answer Assessment - Initial Assessment Questions 1. SYMPTOM: "What is the main symptom you are concerned about?" (e.g., weakness, numbness)     Headache, blurred vision left eye 2. ONSET: "When did this start?" (minutes, hours, days; while sleeping)     Happened Monday 3. LAST NORMAL: "When was the last time you (the patient) were normal (no symptoms)?"     Feels normal today 4. PATTERN "Does this come and go, or has it been constant since it started?"  "Is it present now?"     None today 5. CARDIAC SYMPTOMS: "Have you had any of the following symptoms: chest pain, difficulty breathing, palpitations?"     No 6. NEUROLOGIC SYMPTOMS: "Have you had any of the following symptoms: headache, dizziness, vision loss, double vision, changes in speech, unsteady on your feet?"     None today 7. OTHER SYMPTOMS: "Do you have any other symptoms?"     No 8. PREGNANCY: "Is there any chance you are pregnant?" "When was your last menstrual period?"     N/a  Protocols used: Neurologic Deficit-A-AH

## 2020-11-26 ENCOUNTER — Ambulatory Visit: Payer: Medicare Other | Admitting: Physical Therapy

## 2020-11-26 ENCOUNTER — Encounter: Payer: Self-pay | Admitting: Physical Therapy

## 2020-11-26 DIAGNOSIS — G8929 Other chronic pain: Secondary | ICD-10-CM | POA: Diagnosis not present

## 2020-11-26 DIAGNOSIS — M6281 Muscle weakness (generalized): Secondary | ICD-10-CM | POA: Diagnosis not present

## 2020-11-26 DIAGNOSIS — M5441 Lumbago with sciatica, right side: Secondary | ICD-10-CM | POA: Diagnosis not present

## 2020-11-26 DIAGNOSIS — M5431 Sciatica, right side: Secondary | ICD-10-CM | POA: Diagnosis not present

## 2020-11-26 NOTE — Therapy (Signed)
[place] [facility] PHYSICAL AND SPORTS MEDICINE [address], [place], [address] Phone: [phone]   Fax:  [phone]  Physical Therapy Treatment  Patient Details  Name: Thomas Miller MRN: [id number] Date of Birth: 04/14/1950 Referring Provider (PT): Loura Pardon, MD   Encounter Date: 11/26/2020   PT End of Session - 11/26/20 1530     Visit Number 3    Number of Visits 24    Date for PT Re-Evaluation 02/11/21    Authorization Type [employer] MEDICARE reporting period from 11/19/2020    Progress Note Due on Visit 10    PT Start Time 1430    PT Stop Time 1510    PT Time Calculation (min) 40 min    Activity Tolerance Patient tolerated treatment well             Past Medical History:  Diagnosis Date   Advanced care planning/counseling discussion 11/27/2016   Aortic atherosclerosis (HCC) 03/24/2020   Noted on CT 03/01/19   BPH (benign prostatic hyperplasia) 10/25/2014   CAD (coronary artery disease) 08/07/2017   GERD (gastroesophageal reflux disease) 10/25/2014   Gout    Hearing loss    History of 2019 novel coronavirus disease (COVID-19) 03/24/2020   On 02/09/20   History of stroke 10/05/2019   Hyperlipemia 10/25/2014   Hypertension    Insomnia 05/19/2016   Kidney stones    PAF (paroxysmal atrial fibrillation) (HCC) 08/07/2017   Reflux 10/25/2014   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   S/P epidural steroid injection 05/2018    Past Surgical History:  Procedure Laterality Date   BACK SURGERY     CORONARY ARTERY BYPASS GRAFT N/A 07/20/2017   Procedure: CORONARY ARTERY BYPASS GRAFTING (CABG) times four using left internal mammary artery to LAD and endoscopically harvested right saphenous vein graft to distal circumflex, intermediate, and PD;  Surgeon: [name], MD;  Location: [facility] OR;  Service: Open Heart Surgery;  Laterality: N/A;   EPIDURAL BLOCK INJECTION  05/2018   L3injections    EYE SURGERY Bilateral 2013   FOOT SURGERY Bilateral     ingrown toenails   GALLBLADDER SURGERY     LEFT HEART CATH AND CORONARY ANGIOGRAPHY N/A 07/17/2017   Procedure: LEFT HEART CATH AND CORONARY ANGIOGRAPHY;  Surgeon: Yvonne Kendall, MD;  Location: [facility] INVASIVE CV LAB;  Service: Cardiovascular;  Laterality: N/A;   SHOULDER SURGERY Right    SPINE SURGERY      There were no vitals filed for this visit.     Subjective Assessment - 11/26/20 1429     Subjective Patient reports he is better this afternoon. States he explained the exercise he was doing at home to his doctor (Dr. Charlotta Newton) and showed her it was not a push up but a back stretch and she said it looked like a push up. He requested to go back to PT because he feels it is helping. Per documentation on 11/21/20, pt advised by PCP Dr. Charlotta Newton "NOT to do push ups or anyt aggressive / strenuous back exercises, ok to do stretches and mild exercise for back" and "Ok to get PT and exercise at home twice a day." He has been on his feet since 5 this morning. He continues to have the chronic cloud in his left eye but no headaches. Reports pain 2/10 burning pain in the right calf and knee. He has continued to golf and do standing back extensions. 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 He was cutting metal for a roof this morning. He feels like his back and leg is better overall. He has put off the nerve conduction test to 12/06/20.    Pertinent History Patient is a 70 y.o. male who presents to outpatient physical therapy with a referral for medical diagnosis sciatica of right side. This patient's chief complaints consist of right sided low back, hip, thigh and leg pain/paresthesia leading to the following functional deficits: creates a problem for taking care of everything at the home including his wife who has dementia (bathing, dressing, meals, etc), sleeping, sitting, traveling, going up stairs, walking up a steep grade.  Relevant past medical history and comorbidities include  CABG x4 (2019), flashes in L eye, hx TIA (12/2019), R shoulder surgery, paroxysmal atrial fib (takes metoprolol to slow heart down), gout, GERD, BPH, CAD, kidney stones, HTN, insomnia, hearing loss.  Patient denies hx of cancer, seizures, changes in bowel or bladder problems, new onset stumbling or dropping things, known weak bones/osteoporosis.    Limitations Sitting;Standing;House hold activities;Other (comment)   : creates a problem for taking care of everything at the home including his wife who has dementia (bathing, dressing, meals, etc), sleeping, sitting, traveling, going up stairs, walking up a steep grade.   How long can you sit comfortably? not more than 30 min    Diagnostic tests Note from Verlin Dike NP at [facility] Neurosurgery 11/13/2020:  Reviewd lumbar MRI - overall his MRI looks pretty good. He does have some foraminal stenosis on the right at L3. Reviewed four view lumbar  x-ray-no dynamic instability noted ion flexion-extnesion.   Impression lumbar radiculopathy vs peroneal neuropathy, vs hip pathology?  Ordered  R LE EMG/NCV to be performed.    Patient Stated Goals "to get a little less pain"    Currently in Pain? Yes    Pain Score 3     Pain Onset More than a month ago               TREATMENT:  Per documentation on 11/21/20, pt advised by PCP Dr. Charlotta Newton "NOT to do push ups or anyt aggressive / strenuous back exercises, ok to do stretches and mild exercise for back" and "Ok to get PT and exercise at home twice a day"  Therapeutic exercise: to centralize symptoms and improve ROM, strength, muscular endurance, and activity tolerance required for successful completion of functional activities.  - standing lumbar extension over table to assess form and safety, 1x15, advised patient not to hold so long and to breathe while performing exercises. Cued to place hands on table. Increasing R knee pain.  - prone hip extension, 2x10 each side, knee extended (too difficult with knee  flexed). Cuing for breathing.  - prone on elbows, 1x10 with 1-3 second hold.  - seated hip flexor stretch, 3x30 seconds each side. Slider under back foot. Patient demo good form carry over to second side.  - standing lumbar extension over table, 1x10 - standing wall sags, 1x10 - hooklying LTR, 1x10 with burining at R glute with legs to left  - hooklying R sciatic nerve glide, slider technique, 1x15 - Education on HEP including handout   Manual therapy: to reduce pain and tissue tension, improve range of motion, neuromodulation, in order to promote improved ability to complete functional activities. PRONE with pillow under chest and ankles (reports chest pain with prone position) - CPA grade III- IV T7-T5, 20-40 reps per level with additional reps at lowest segments. - R  UPA at L4-L5 grade III-IV   Pt required multimodal cuing for proper technique and to facilitate improved neuromuscular control, strength, range of motion, and functional ability resulting in improved performance and form.   HOME EXERCISE PROGRAM Access Code: [email or web address] URL: [email or web address] Date: 11/26/2020 Prepared by: Norton Blizzard  Exercises Seated Correct Posture Standing Lumbar Extension with Counter - 2 x daily - 1 sets - 10-15 reps - 1 second hold Standing Lumbar Extension at Wall - Forearms - 2 x daily - 1 sets - 10-15 reps - 1 second hold Supine 90/90 Sciatic Nerve Glide with Knee Flexion/Extension - 2 x daily - 15 reps   [email or web address]   Seated Hip Flexor Stretch  -  Repeat 3 Times, Hold 30 Seconds, Perform 1 Times a Day   PT Education - 11/26/20 1530     Education Details Exercise purpose/form. Self management techniques.    Person(s) Educated Patient    Methods Explanation;Demonstration;Tactile cues;Verbal cues;Handout    Comprehension Verbalized understanding;Returned demonstration;Verbal cues required;Tactile cues required;Need further instruction              PT Short  Term Goals - 11/21/20 1748       PT SHORT TERM GOAL #1   Title Be independent with initial home exercise program for self-management of symptoms.    Baseline Initial HEP provied at IE (11/19/2020);    Time 2    Period Weeks    Status Achieved    Target Date 12/03/20               PT Long Term Goals - 11/19/20 1426       PT LONG TERM GOAL #1   Title Be independent with a long-term home exercise program for self-management of symptoms.    Baseline Initial HEP provided at [facility] (11/19/2020);    Time 12    Period Weeks    Status New   TARGET DATE FOR ALL LONG TERM GOALS: 02/11/2021     PT LONG TERM GOAL #2   Title Demonstrate improved FOTO to equal or greater than 65 by visit #12 to demonstrate improvement in overall condition and self-reported functional ability.    Baseline 60 (11/19/2020);    Time 12    Period Weeks    Status New      PT LONG TERM GOAL #3   Title Pateint will demonstrate equal or greater than 4+/5 hip strength in bilateral hip abduciton to improve lumbopelvic stability during funcitonal activities such as climbing stairs.    Baseline R = 4+/5, L = 4/5 (11/19/2020);    Time 12    Period Weeks    Status New      PT LONG TERM GOAL #4   Title Reduce pain with functional activities to equal or less than 1/10 to allow patient to complete usual activities including sleeping, caregiving, sitting, traveling with less difficulty.    Baseline up to 10/10 (11/19/2020);    Time 12    Period Weeks    Status New      PT LONG TERM GOAL #5   Title Complete community, work and/or recreational activities without limitation due to current condition.    Baseline Functional Limitations: creates a problem for taking care of everything at the home including his wife who has dementia (bathing, dressing, meals, etc), sleeping, sitting, traveling, going up stairs, walking up a steep grade (11/19/2020);    Time 12    Period Weeks    Status New  Additional Long Term Goals    Additional Long Term Goals Yes      PT LONG TERM GOAL #6   Title Patient will improve lumbar extension to equal or greater than 50% to improve mobility for functional activity.    Baseline Extension: 25% or less (11/19/2020);    Time 12    Period Weeks    Status New                   Plan - 11/26/20 1605     Clinical Impression Statement Patient tolerated session with report of increased central low back pain by end of session and soreness in the abdominal muscles. Emphasized breathing during exercises and patient was consulted frequently about the intensity level and strain he was feeling with exercises. Continued with PT after MD approved him to return. Will continue to monitor progress and response to extension exercises. Patient would benefit from continued management of limiting condition by skilled physical therapist to address remaining impairments and functional limitations to work towards stated goals and return to PLOF or maximal functional independence.    Personal Factors and Comorbidities Age;Past/Current Experience;Time since onset of injury/illness/exacerbation    Examination-Activity Limitations Stairs;Caring for Others;Sit;Sleep    Examination-Participation Restrictions Cleaning;Laundry;Shop;Community Activity;Meal Prep;Interpersonal Relationship;Other   creates a problem for taking care of everything at the home including his wife who has dementia (bathing, dressing, meals, etc), sleeping, sitting, traveling, going up stairs, walking up a steep grade.   Stability/Clinical Decision Making Stable/Uncomplicated    Rehab Potential Good    PT Frequency 2x / week    PT Duration 12 weeks    PT Treatment/Interventions ADLs/Self Care Home Management;Cryotherapy;Moist Heat;Electrical Stimulation;Gait training;Stair training;Functional mobility training;Neuromuscular re-education;Balance training;Therapeutic exercise;Therapeutic activities;Patient/family education;Manual  techniques;Dry needling;Passive range of motion;Joint Manipulations    PT Next Visit Plan update HEP as appropriate, specific exercise, LE/core/functional strengthening, manual therapy as needed    PT Home Exercise Plan Medbridge Access Code: [email or web address]    Consulted and Agree with Plan of Care Patient             Patient will benefit from skilled therapeutic intervention in order to improve the following deficits and impairments:  Improper body mechanics, Pain, Postural dysfunction, Increased muscle spasms, Decreased mobility, Decreased activity tolerance, Decreased endurance, Decreased range of motion, Decreased strength, Hypomobility, Impaired perceived functional ability, Impaired flexibility  Visit Diagnosis: Chronic right-sided low back pain with right-sided sciatica  Muscle weakness (generalized)     Problem List Patient Active Problem List   Diagnosis Date Noted   Chronic low back pain without sciatica 11/21/2020   Hearing loss    Kidney stones    Aortic atherosclerosis (HCC) 03/24/2020   History of 2019 novel coronavirus disease (COVID-19) 03/24/2020   History of stroke 10/05/2019   S/P epidural steroid injection 05/2018   PAF (paroxysmal atrial fibrillation) (HCC) 08/07/2017   CAD (coronary artery disease) 08/07/2017   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 Ilsa Iha, PT, DPT 11/26/20, 4:06 PM   [facility] PHYSICAL AND SPORTS MEDICINE [address] [place], [address] Phone: [phone]   Fax:  [phone]  Name: Thomas Miller MRN: [id number] Date of Birth: Sep 24, 1950

## 2020-11-27 ENCOUNTER — Encounter: Payer: Medicare Other | Admitting: Physical Therapy

## 2020-11-28 ENCOUNTER — Encounter: Payer: Self-pay | Admitting: Physical Therapy

## 2020-11-28 ENCOUNTER — Ambulatory Visit: Payer: Medicare Other | Attending: Internal Medicine | Admitting: Physical Therapy

## 2020-11-28 ENCOUNTER — Ambulatory Visit: Payer: Medicare Other | Admitting: Physical Therapy

## 2020-11-28 DIAGNOSIS — M5441 Lumbago with sciatica, right side: Secondary | ICD-10-CM | POA: Insufficient documentation

## 2020-11-28 DIAGNOSIS — M6281 Muscle weakness (generalized): Secondary | ICD-10-CM | POA: Insufficient documentation

## 2020-11-28 DIAGNOSIS — G8929 Other chronic pain: Secondary | ICD-10-CM | POA: Diagnosis not present

## 2020-11-28 NOTE — Therapy (Signed)
[facility] PHYSICAL AND SPORTS MEDICINE [address] [place], [address] Phone: [phone]   Fax:  [phone]  Physical Therapy Treatment  Patient Details  Name: Thomas Miller MRN: [id number] Date of Birth: [birth year] Referring Provider (PT): Loura Pardon, MD   Encounter Date: 11/28/2020   PT End of Session - 11/28/20 1117     Visit Number 4    Number of Visits 24    Date for PT Re-Evaluation 02/11/21    Authorization Type [employer] MEDICARE reporting period from 11/19/2020    Progress Note Due on Visit 10    PT Start Time 1110    PT Stop Time 1150    PT Time Calculation (min) 40 min    Activity Tolerance Patient tolerated treatment well    Behavior During Therapy [facility] for tasks assessed/performed             Past Medical History:  Diagnosis Date   Advanced care planning/counseling discussion 11/27/2016   Aortic atherosclerosis (HCC) 03/24/2020   Noted on CT 03/01/19   BPH (benign prostatic hyperplasia) 10/25/2014   CAD (coronary artery disease) 08/07/2017   GERD (gastroesophageal reflux disease) 10/25/2014   Gout    Hearing loss    History of 2019 novel coronavirus disease (COVID-19) 03/24/2020   On 02/09/20   History of stroke 10/05/2019   Hyperlipemia 10/25/2014   Hypertension    Insomnia 05/19/2016   Kidney stones    PAF (paroxysmal atrial fibrillation) (HCC) 08/07/2017   Reflux 10/25/2014   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   S/P epidural steroid injection 05/2018    Past Surgical History:  Procedure Laterality Date   BACK SURGERY     CORONARY ARTERY BYPASS GRAFT N/A 07/20/2017   Procedure: CORONARY ARTERY BYPASS GRAFTING (CABG) times four using left internal mammary artery to LAD and endoscopically harvested right saphenous vein graft to distal circumflex, intermediate, and PD;  Surgeon: [name], MD;  Location: [facility] OR;  Service: Open Heart Surgery;  Laterality: N/A;   EPIDURAL BLOCK INJECTION  05/2018   L3injections     EYE SURGERY Bilateral 2013   FOOT SURGERY Bilateral    ingrown toenails   GALLBLADDER SURGERY     LEFT HEART CATH AND CORONARY ANGIOGRAPHY N/A 07/17/2017   Procedure: LEFT HEART CATH AND CORONARY ANGIOGRAPHY;  Surgeon: Yvonne Kendall, MD;  Location: [facility] INVASIVE CV LAB;  Service: Cardiovascular;  Laterality: N/A;   SHOULDER SURGERY Right    SPINE SURGERY      There were no vitals filed for this visit.   Subjective Assessment - 11/28/20 1111     Subjective Patient reports he was a little sore when he left and really sore yesterday but it helped to walk about 4 miles and play golf. The soreness was in the lowest lumbar spine. Reports no changes in his leg. He awoke with pain buring in his right hip at 4am and getting up to walk and stretch helped. Reports his left shoulder is hurting and he thinks it is from the wall sags. He did all of his HEP except the nerve glide. Reports current pain as 2x10 in the right calf.    Pertinent History Patient is a 70 y.o. male who presents to outpatient physical therapy with a referral for medical diagnosis sciatica of right side. This patient's chief complaints consist of right sided low back, hip, thigh and leg pain/paresthesia leading to the following functional deficits: creates a problem for taking care of  everything at the home including his wife who has dementia (bathing, dressing, meals, etc), sleeping, sitting, traveling, going up stairs, walking up a steep grade.  Relevant past medical history and comorbidities include CABG x4 (2019), flashes in L eye, hx TIA (12/2019), R shoulder surgery, paroxysmal atrial fib (takes metoprolol to slow heart down), gout, GERD, BPH, CAD, kidney stones, HTN, insomnia, hearing loss.  Patient denies hx of cancer, seizures, changes in bowel or bladder problems, new onset stumbling or dropping things, known weak bones/osteoporosis.    Limitations Sitting;Standing;House hold activities;Other (comment)   : creates a problem for  taking care of everything at the home including his wife who has dementia (bathing, dressing, meals, etc), sleeping, sitting, traveling, going up stairs, walking up a steep grade.   How long can you sit comfortably? not more than 30 min    Diagnostic tests Note from Verlin Dike NP at [facility] 11/13/2020:  Reviewd lumbar MRI - overall his MRI looks pretty good. He does have some foraminal stenosis on the right at L3. Reviewed four view lumbar  x-ray-no dynamic instability noted ion flexion-extnesion.   Impression lumbar radiculopathy vs peroneal neuropathy, vs hip pathology?  Ordered  R LE EMG/NCV to be performed.    Patient Stated Goals "to get a little less pain"    Currently in Pain? Yes    Pain Score 2     Pain Onset More than a month ago              TREATMENT:  Per documentation on 11/21/20, pt advised by PCP Dr. Charlotta Newton "NOT to do push ups or anyt aggressive / strenuous back exercises, ok to do stretches and mild exercise for back" and "Ok to get PT and exercise at home twice a day" Denies bilateral THA   Therapeutic exercise: to centralize symptoms and improve ROM, strength, muscular endurance, and activity tolerance required for successful completion of functional activities.  - Treadmill 1.2 mph with 0% grade. For improved lower extremity mobility, muscular endurance, and weightbearing activity tolerance; and to induce the analgesic effect of aerobic exercise, stimulate improved joint nutrition, and prepare body structures and systems for following interventions. x 5  minutes. Patient has not walked on TM before and has unnatural gait that improves with time/practice.  - standing wall sags, 1x10, review for appropriate shoulder placement to decreased pressure on L shoulder. (Keep L elbow below shoulder).  - Quadruped bird dog (alternating shoulder flexion/contralateral hip extension with core muscles braced). 3x10 each side. - seated hip flexor stretch, 3x30 seconds each  side.  - seated lat pull 1x10 at 20# and 1x10 at 35# (tingling started in right thigh).  - hooklying R sciatic nerve glide, slider technique, 1x15 each side.  - standing face pulls, 1x5 at 15#, 2x10 at 10#.  - Standing pallof press (multifidus press) with 5# cable 1x10 each side. (Caused burning in leg so discontinued).  - standing lumbar extension over table, 1x15 Tingling around right knee. Improved LE tingling after.  - runners step up with touchdown UE support, 1x10 each side (burning down R LE after set on that side) - standing lumbar extension over table, 1x15   Manual therapy: to reduce pain and tissue tension, improve range of motion, neuromodulation, in order to promote improved ability to complete functional activities. PRONE with head in blue cradle. 3 min flat + 3 min at 20 degrees head of bed elevation - CPA grade III at L3-L5  - STM to bilateral lumbar and  lower thoracic paraspinals.    Pt required multimodal cuing for proper technique and to facilitate improved neuromuscular control, strength, range of motion, and functional ability resulting in improved performance and form.   HOME EXERCISE PROGRAM Access Code: [email or web address] URL: [email or web address] Date: 11/26/2020 Prepared by: Norton Blizzard   Exercises Seated Correct Posture Standing Lumbar Extension with Counter - 2 x daily - 1 sets - 10-15 reps - 1 second hold Standing Lumbar Extension at Wall - Forearms - 2 x daily - 1 sets - 10-15 reps - 1 second hold Supine 90/90 Sciatic Nerve Glide with Knee Flexion/Extension - 2 x daily - 15 reps     [email or web address]    Seated Hip Flexor Stretch  -  Repeat 3 Times, Hold 30 Seconds, Perform 1 Times a Day    PT Education - 11/28/20 1116     Education Details Exercise purpose/form. Self management techniques.    Person(s) Educated Patient    Methods Explanation;Demonstration;Tactile cues;Verbal cues    Comprehension Verbalized understanding;Returned  demonstration;Verbal cues required;Tactile cues required;Need further instruction              PT Short Term Goals - 11/21/20 1748       PT SHORT TERM GOAL #1   Title Be independent with initial home exercise program for self-management of symptoms.    Baseline Initial HEP provied at IE (11/19/2020);    Time 2    Period Weeks    Status Achieved    Target Date 12/03/20               PT Long Term Goals - 11/19/20 1426       PT LONG TERM GOAL #1   Title Be independent with a long-term home exercise program for self-management of symptoms.    Baseline Initial HEP provided at IE (11/19/2020);    Time 12    Period Weeks    Status New   TARGET DATE FOR ALL LONG TERM GOALS: 02/11/2021     PT LONG TERM GOAL #2   Title Demonstrate improved FOTO to equal or greater than 65 by visit #12 to demonstrate improvement in overall condition and self-reported functional ability.    Baseline 60 (11/19/2020);    Time 12    Period Weeks    Status New      PT LONG TERM GOAL #3   Title Pateint will demonstrate equal or greater than 4+/5 hip strength in bilateral hip abduciton to improve lumbopelvic stability during funcitonal activities such as climbing stairs.    Baseline R = 4+/5, L = 4/5 (11/19/2020);    Time 12    Period Weeks    Status New      PT LONG TERM GOAL #4   Title Reduce pain with functional activities to equal or less than 1/10 to allow patient to complete usual activities including sleeping, caregiving, sitting, traveling with less difficulty.    Baseline up to 10/10 (11/19/2020);    Time 12    Period Weeks    Status New      PT LONG TERM GOAL #5   Title Complete community, work and/or recreational activities without limitation due to current condition.    Baseline Functional Limitations: creates a problem for taking care of everything at the home including his wife who has dementia (bathing, dressing, meals, etc), sleeping, sitting, traveling, going up stairs,  walking up a steep grade (11/19/2020);    Time 12    Period Weeks    Status  New      Additional Long Term Goals   Additional Long Term Goals Yes      PT LONG TERM GOAL #6   Title Patient will improve lumbar extension to equal or greater than 50% to improve mobility for functional activity.    Baseline Extension: 25% or less (11/19/2020);    Time 12    Period Weeks    Status New                   Plan - 11/28/20 1212     Clinical Impression Statement Patient tolerated treatment well overall but continues to have pain down the right LE with any activity that involves flexion of right hip. May benefit from [facility] to right glutes next session or consider LAD through hips. Patient does appear to centralize with lumbar extension but it is not stable in maintaining centralization and patient has a lot of stiffness at the low back that is painful after sustained positioning or progressions of extension exercises. Patient would benefit from continued management of limiting condition by skilled physical therapist to address remaining impairments and functional limitations to work towards stated goals and return to PLOF or maximal functional independence.    Personal Factors and Comorbidities Age;Past/Current Experience;Time since onset of injury/illness/exacerbation    Examination-Activity Limitations Stairs;Caring for Others;Sit;Sleep    Examination-Participation Restrictions Cleaning;Laundry;Shop;Community Activity;Meal Prep;Interpersonal Relationship;Other   creates a problem for taking care of everything at the home including his wife who has dementia (bathing, dressing, meals, etc), sleeping, sitting, traveling, going up stairs, walking up a steep grade.   Stability/Clinical Decision Making Stable/Uncomplicated    Rehab Potential Good    PT Frequency 2x / week    PT Duration 12 weeks    PT Treatment/Interventions ADLs/Self Care Home Management;Cryotherapy;Moist Heat;Electrical  Stimulation;Gait training;Stair training;Functional mobility training;Neuromuscular re-education;Balance training;Therapeutic exercise;Therapeutic activities;Patient/family education;Manual techniques;Dry needling;Passive range of motion;Joint Manipulations    PT Next Visit Plan update HEP as appropriate, specific exercise, LE/core/functional strengthening, manual therapy as needed    PT Home Exercise Plan Medbridge Access Code: [email or web address]    Consulted and Agree with Plan of Care Patient             Patient will benefit from skilled therapeutic intervention in order to improve the following deficits and impairments:  Improper body mechanics, Pain, Postural dysfunction, Increased muscle spasms, Decreased mobility, Decreased activity tolerance, Decreased endurance, Decreased range of motion, Decreased strength, Hypomobility, Impaired perceived functional ability, Impaired flexibility  Visit Diagnosis: Chronic right-sided low back pain with right-sided sciatica  Muscle weakness (generalized)     Problem List Patient Active Problem List   Diagnosis Date Noted   Chronic low back pain without sciatica 11/21/2020   Hearing loss    Kidney stones    Aortic atherosclerosis (HCC) 03/24/2020   History of 2019 novel coronavirus disease (COVID-19) 03/24/2020   History of stroke 10/05/2019   S/P epidural steroid injection 05/2018   PAF (paroxysmal atrial fibrillation) (HCC) 08/07/2017   CAD (coronary artery disease) 08/07/2017   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 Ilsa Iha, PT, DPT 11/28/20, 12:13 PM  [facility] PHYSICAL AND SPORTS MEDICINE [address], [place], [address] Phone: [phone]   Fax:  [phone]  Name: Ilyas Amoroso  Madril  MRN: [id number] Date of Birth: 05/15/1950

## 2020-11-29 ENCOUNTER — Encounter: Payer: Medicare Other | Admitting: Physical Therapy

## 2020-12-03 DIAGNOSIS — M79604 Pain in right leg: Secondary | ICD-10-CM | POA: Insufficient documentation

## 2020-12-03 DIAGNOSIS — R200 Anesthesia of skin: Secondary | ICD-10-CM | POA: Insufficient documentation

## 2020-12-03 HISTORY — DX: Anesthesia of skin: R20.0

## 2020-12-03 HISTORY — DX: Pain in right leg: M79.604

## 2020-12-04 ENCOUNTER — Encounter: Payer: Medicare Other | Admitting: Physical Therapy

## 2020-12-05 ENCOUNTER — Encounter: Payer: Self-pay | Admitting: Physical Therapy

## 2020-12-05 ENCOUNTER — Ambulatory Visit: Payer: Medicare Other | Admitting: Physical Therapy

## 2020-12-05 DIAGNOSIS — M6281 Muscle weakness (generalized): Secondary | ICD-10-CM | POA: Diagnosis not present

## 2020-12-05 DIAGNOSIS — M5441 Lumbago with sciatica, right side: Secondary | ICD-10-CM | POA: Diagnosis not present

## 2020-12-05 DIAGNOSIS — G8929 Other chronic pain: Secondary | ICD-10-CM

## 2020-12-05 NOTE — Therapy (Signed)
[facility], [facility] PHYSICAL AND SPORTS MEDICINE [address] [place], [address] Phone: [phone]   Fax:  [phone]  Physical Therapy Treatment  Patient Details  Name: Thomas Miller MRN: [id number] Date of Birth: 1950-05-28 Referring Provider (PT): Loura Pardon, MD   Encounter Date: 12/05/2020   PT End of Session - 12/05/20 0909     Visit Number 5    Number of Visits 24    Date for PT Re-Evaluation 02/11/21    Authorization Type [employer] MEDICARE reporting period from 11/19/2020    Progress Note Due on Visit 10    PT Start Time 0902    PT Stop Time 0942    PT Time Calculation (min) 40 min    Activity Tolerance Patient tolerated treatment well    Behavior During Therapy [facility] for tasks assessed/performed             Past Medical History:  Diagnosis Date   Advanced care planning/counseling discussion 11/27/2016   Aortic atherosclerosis (HCC) 03/24/2020   Noted on CT 03/01/19   BPH (benign prostatic hyperplasia) 10/25/2014   CAD (coronary artery disease) 08/07/2017   GERD (gastroesophageal reflux disease) 10/25/2014   Gout    Hearing loss    History of 2019 novel coronavirus disease (COVID-19) 03/24/2020   On 02/09/20   History of stroke 10/05/2019   Hyperlipemia 10/25/2014   Hypertension    Insomnia 05/19/2016   Kidney stones    PAF (paroxysmal atrial fibrillation) (HCC) 08/07/2017   Reflux 10/25/2014   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   S/P epidural steroid injection 05/2018    Past Surgical History:  Procedure Laterality Date   BACK SURGERY     CORONARY ARTERY BYPASS GRAFT N/A 07/20/2017   Procedure: CORONARY ARTERY BYPASS GRAFTING (CABG) times four using left internal mammary artery to LAD and endoscopically harvested right saphenous vein graft to distal circumflex, intermediate, and PD;  Surgeon: [name], MD;  Location: [facility] OR;  Service: Open Heart Surgery;  Laterality: N/A;   EPIDURAL BLOCK INJECTION  05/2018   L3injections     EYE SURGERY Bilateral 2013   FOOT SURGERY Bilateral    ingrown toenails   GALLBLADDER SURGERY     LEFT HEART CATH AND CORONARY ANGIOGRAPHY N/A 07/17/2017   Procedure: LEFT HEART CATH AND CORONARY ANGIOGRAPHY;  Surgeon: Yvonne Kendall, MD;  Location: [facility] INVASIVE CV LAB;  Service: Cardiovascular;  Laterality: N/A;   SHOULDER SURGERY Right    SPINE SURGERY      There were no vitals filed for this visit.   Subjective Assessment - 12/05/20 0904     Subjective Pateint reports he has 6/10 pain in the right calf and 2/10 in the right hip. States he has been sleeping better (about 5 hours last night). He is tired after golfing and caring for his wife who was not being cooperative. States the exercises have been helping him. Reports he usually has increased pain in the morning. He has continued to be a little sore in the low back but thinks he felt quite a bit better after last PT session.    Pertinent History Patient is a 70 y.o. male who presents to outpatient physical therapy with a referral for medical diagnosis sciatica of right side. This patient's chief complaints consist of right sided low back, hip, thigh and leg pain/paresthesia leading to the following functional deficits: creates a problem for taking care of everything at the home including his wife who has dementia (  bathing, dressing, meals, etc), sleeping, sitting, traveling, going up stairs, walking up a steep grade.  Relevant past medical history and comorbidities include CABG x4 (2019), flashes in L eye, hx TIA (12/2019), R shoulder surgery, paroxysmal atrial fib (takes metoprolol to slow heart down), gout, GERD, BPH, CAD, kidney stones, HTN, insomnia, hearing loss.  Patient denies hx of cancer, seizures, changes in bowel or bladder problems, new onset stumbling or dropping things, known weak bones/osteoporosis.    Limitations Sitting;Standing;House hold activities;Other (comment)   : creates a problem for taking care of everything at the  home including his wife who has dementia (bathing, dressing, meals, etc), sleeping, sitting, traveling, going up stairs, walking up a steep grade.   How long can you sit comfortably? not more than 30 min    Diagnostic tests Note from Verlin Dike NP at [facility] 11/13/2020:  Reviewd lumbar MRI - overall his MRI looks pretty good. He does have some foraminal stenosis on the right at L3. Reviewed four view lumbar  x-ray-no dynamic instability noted ion flexion-extnesion.   Impression lumbar radiculopathy vs peroneal neuropathy, vs hip pathology?  Ordered  R LE EMG/NCV to be performed.    Patient Stated Goals "to get a little less pain"    Currently in Pain? Yes    Pain Score 6     Pain Onset More than a month ago                 TREATMENT:  Per documentation on 11/21/20, pt advised by PCP Dr. Charlotta Newton "NOT to do push ups or any aggressive / strenuous back exercises, ok to do stretches and mild exercise for back" and "Ok to get PT and exercise at home twice a day" Denies bilateral THA   Therapeutic exercise: to centralize symptoms and improve ROM, strength, muscular endurance, and activity tolerance required for successful completion of functional activities.  - Treadmill up to 1.5 mph with 0% grade. For improved lower extremity mobility, muscular endurance, and weightbearing activity tolerance; and to induce the analgesic effect of aerobic exercise, stimulate improved joint nutrition, and prepare body structures and systems for following interventions. x 5  minutes. Patient has not walked on TM before and has unnatural gait that improves with time/practice.  - standing wall sags 1x45  review for appropriate shoulder placement to decreased pressure on L shoulder. (Reports pain in calf down to 2/10 but arms tired).  - standing lumbar extension over table, 1x20 (Manual therapy - see below) - prone with chest on pillow and head of bed elevated. 1x6 min at 27 degrees, 1x3 min at 35  degrees (pain centralized to R buttock and low back).  - Education on HEP including handout    Manual therapy: to reduce pain and tissue tension, improve range of motion, neuromodulation, in order to promote improved ability to complete functional activities. PRONE with pillow under chest. 3 min flat + 3 min at 27 degrees head of bed elevation - CPA grade III-4 with and without KE wedge at T5-L5  - STM to bilateral lumbar and lower thoracic paraspinals.    Pt required multimodal cuing for proper technique and to facilitate improved neuromuscular control, strength, range of motion, and functional ability resulting in improved performance and form.   HOME EXERCISE PROGRAM Access Code: [email or web address] URL: [email or web address] Date: 12/05/2020 Prepared by: Norton Blizzard  Exercises Seated Correct Posture Standing Lumbar Extension at Wall - Forearms - 4-6 x daily - 1 sets - 10-15 reps - 1 second hold  Supine 90/90 Sciatic Nerve Glide with Knee Flexion/Extension - 2 x daily - 15 reps Lying Prone - 2-4 x daily - 3-5 minutes hold Prone Static Back Extension on Pillows - 2-4 x daily - 3-5 min hold    [email or web address]    Seated Hip Flexor Stretch  -  Repeat 3 Times, Hold 30 Seconds, Perform 1 Times a Day     PT Education - 12/05/20 0909     Education Details Exercise purpose/form. Self management techniques.    Person(s) Educated Patient    Methods Explanation;Demonstration;Tactile cues;Verbal cues    Comprehension Verbalized understanding;Returned demonstration;Verbal cues required;Tactile cues required;Need further instruction              PT Short Term Goals - 11/21/20 1748       PT SHORT TERM GOAL #1   Title Be independent with initial home exercise program for self-management of symptoms.    Baseline Initial HEP provied at IE (11/19/2020);    Time 2    Period Weeks    Status Achieved    Target Date 12/03/20               PT Long Term Goals - 11/19/20 1426        PT LONG TERM GOAL #1   Title Be independent with a long-term home exercise program for self-management of symptoms.    Baseline Initial HEP provided at IE (11/19/2020);    Time 12    Period Weeks    Status New   TARGET DATE FOR ALL LONG TERM GOALS: 02/11/2021     PT LONG TERM GOAL #2   Title Demonstrate improved FOTO to equal or greater than 65 by visit #12 to demonstrate improvement in overall condition and self-reported functional ability.    Baseline 60 (11/19/2020);    Time 12    Period Weeks    Status New      PT LONG TERM GOAL #3   Title Pateint will demonstrate equal or greater than 4+/5 hip strength in bilateral hip abduciton to improve lumbopelvic stability during funcitonal activities such as climbing stairs.    Baseline R = 4+/5, L = 4/5 (11/19/2020);    Time 12    Period Weeks    Status New      PT LONG TERM GOAL #4   Title Reduce pain with functional activities to equal or less than 1/10 to allow patient to complete usual activities including sleeping, caregiving, sitting, traveling with less difficulty.    Baseline up to 10/10 (11/19/2020);    Time 12    Period Weeks    Status New      PT LONG TERM GOAL #5   Title Complete community, work and/or recreational activities without limitation due to current condition.    Baseline Functional Limitations: creates a problem for taking care of everything at the home including his wife who has dementia (bathing, dressing, meals, etc), sleeping, sitting, traveling, going up stairs, walking up a steep grade (11/19/2020);    Time 12    Period Weeks    Status New      Additional Long Term Goals   Additional Long Term Goals Yes      PT LONG TERM GOAL #6   Title Patient will improve lumbar extension to equal or greater than 50% to improve mobility for functional activity.    Baseline Extension: 25% or less (11/19/2020);    Time 12    Period Weeks    Status New  Plan - 12/05/20 1319      Clinical Impression Statement Patient experienced centralization of pain to right glute region by end of visit where it remained about 4/10 despite progressions of extension interventions. Initially had some pain down the leg with CPAs but improved after static positioning in increasing lumbar extension. Updated HEP to include static positions and more frequent extension. Patient would benefit from continued management of limiting condition by skilled physical therapist to address remaining impairments and functional limitations to work towards stated goals and return to PLOF or maximal functional independence.    Personal Factors and Comorbidities Age;Past/Current Experience;Time since onset of injury/illness/exacerbation    Examination-Activity Limitations Stairs;Caring for Others;Sit;Sleep    Examination-Participation Restrictions Cleaning;Laundry;Shop;Community Activity;Meal Prep;Interpersonal Relationship;Other   creates a problem for taking care of everything at the home including his wife who has dementia (bathing, dressing, meals, etc), sleeping, sitting, traveling, going up stairs, walking up a steep grade.   Stability/Clinical Decision Making Stable/Uncomplicated    Rehab Potential Good    PT Frequency 2x / week    PT Duration 12 weeks    PT Treatment/Interventions ADLs/Self Care Home Management;Cryotherapy;Moist Heat;Electrical Stimulation;Gait training;Stair training;Functional mobility training;Neuromuscular re-education;Balance training;Therapeutic exercise;Therapeutic activities;Patient/family education;Manual techniques;Dry needling;Passive range of motion;Joint Manipulations    PT Next Visit Plan update HEP as appropriate, specific exercise, LE/core/functional strengthening, manual therapy as needed    PT Home Exercise Plan Medbridge Access Code: [email or web address]    Consulted and Agree with Plan of Care Patient             Patient will benefit from skilled therapeutic intervention in  order to improve the following deficits and impairments:  Improper body mechanics, Pain, Postural dysfunction, Increased muscle spasms, Decreased mobility, Decreased activity tolerance, Decreased endurance, Decreased range of motion, Decreased strength, Hypomobility, Impaired perceived functional ability, Impaired flexibility  Visit Diagnosis: Chronic right-sided low back pain with right-sided sciatica  Muscle weakness (generalized)     Problem List Patient Active Problem List   Diagnosis Date Noted   Chronic low back pain without sciatica 11/21/2020   Hearing loss    Kidney stones    Aortic atherosclerosis (HCC) 03/24/2020   History of 2019 novel coronavirus disease (COVID-19) 03/24/2020   History of stroke 10/05/2019   S/P epidural steroid injection 05/2018   PAF (paroxysmal atrial fibrillation) (HCC) 08/07/2017   CAD (coronary artery disease) 08/07/2017   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 Ilsa Iha, PT, DPT 12/05/20, 1:20 PM   [facility] PHYSICAL AND SPORTS MEDICINE [address] [place], [address] Phone: [phone]   Fax:  [phone]  Name: Thomas Miller MRN: [id number] Date of Birth: 12-11-1950

## 2020-12-10 ENCOUNTER — Encounter: Payer: Self-pay | Admitting: Physical Therapy

## 2020-12-10 ENCOUNTER — Ambulatory Visit: Payer: Medicare Other | Admitting: Physical Therapy

## 2020-12-10 DIAGNOSIS — G8929 Other chronic pain: Secondary | ICD-10-CM

## 2020-12-10 DIAGNOSIS — M6281 Muscle weakness (generalized): Secondary | ICD-10-CM

## 2020-12-10 DIAGNOSIS — M5441 Lumbago with sciatica, right side: Secondary | ICD-10-CM | POA: Diagnosis not present

## 2020-12-10 NOTE — Therapy (Signed)
[facility] MEDICINE [address], [place], [address] Phone: [phone]   Fax:  [phone]  Physical Therapy Treatment  Patient Details  Name: Thomas Miller MRN: [id number] Date of Birth: 20-Nov-1950 Referring Provider (PT): Loura Pardon, MD   Encounter Date: 12/10/2020   PT End of Session - 12/10/20 1038     Visit Number 6    Number of Visits 24    Date for PT Re-Evaluation 02/11/21    Authorization Type [employer] MEDICARE reporting period from 11/19/2020    Progress Note Due on Visit 10    PT Start Time 1030    PT Stop Time 1110    PT Time Calculation (min) 40 min    Activity Tolerance Patient tolerated treatment well    Behavior During Therapy [facility] for tasks assessed/performed             Past Medical History:  Diagnosis Date   Advanced care planning/counseling discussion 11/27/2016   Aortic atherosclerosis (HCC) 03/24/2020   Noted on CT 03/01/19   BPH (benign prostatic hyperplasia) 10/25/2014   CAD (coronary artery disease) 08/07/2017   GERD (gastroesophageal reflux disease) 10/25/2014   Gout    Hearing loss    History of 2019 novel coronavirus disease (COVID-19) 03/24/2020   On 02/09/20   History of stroke 10/05/2019   Hyperlipemia 10/25/2014   Hypertension    Insomnia 05/19/2016   Kidney stones    PAF (paroxysmal atrial fibrillation) (HCC) 08/07/2017   Reflux 10/25/2014   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   S/P epidural steroid injection 05/2018    Past Surgical History:  Procedure Laterality Date   BACK SURGERY     CORONARY ARTERY BYPASS GRAFT N/A 07/20/2017   Procedure: CORONARY ARTERY BYPASS GRAFTING (CABG) times four using left internal mammary artery to LAD and endoscopically harvested right saphenous vein graft to distal circumflex, intermediate, and PD;  Surgeon: [name], MD;  Location: [facility] OR;  Service: Open Heart Surgery;  Laterality: N/A;   EPIDURAL BLOCK INJECTION  05/2018    L3injections    EYE SURGERY Bilateral 2013   FOOT SURGERY Bilateral    ingrown toenails   GALLBLADDER SURGERY     LEFT HEART CATH AND CORONARY ANGIOGRAPHY N/A 07/17/2017   Procedure: LEFT HEART CATH AND CORONARY ANGIOGRAPHY;  Surgeon: Yvonne Kendall, MD;  Location: [facility] INVASIVE CV LAB;  Service: Cardiovascular;  Laterality: N/A;   SHOULDER SURGERY Right    SPINE SURGERY      There were no vitals filed for this visit.   Subjective Assessment - 12/10/20 1032     Subjective Patient reports he has a lot of burning in his right calf 3/10 afer sitting in the car for a while. He woke at 4am last night with a lot of pain in his hip and leg, so he stood up and did some lumbar extensions on the wall and with his high bed behind him and the pain cleared up pretty quick. States laying on the bed or floor with something under his chest is not helpful and doing scapular protraction in sitting does not seem to help either. He states he is doing his extension exercises about once very 4-5 hours. he did not golf and did not walk as much as usual this week and his wife's dementia was causing her to be extra wild this weekend. He continues to feel that things are getting better and he thinks it is helping a lot.  He is sleeping a little better and a little longer. He tries to stay up as long as he can but his wife goes to bed at 7pm and he gets tired at 9pm. He often wakes up at 4am. He has already made breakfast, fed his wife, bathed his wife, [facility], done 2 loads of laundry, and gone grocery shopping this morning so far.    Pertinent History Patient is a 70 y.o. male who presents to outpatient physical therapy with a referral for medical diagnosis sciatica of right side. This patient's chief complaints consist of right sided low back, hip, thigh and leg pain/paresthesia leading to the following functional deficits: creates a problem for taking care of everything at the home including his wife who  has dementia (bathing, dressing, meals, etc), sleeping, sitting, traveling, going up stairs, walking up a steep grade.  Relevant past medical history and comorbidities include CABG x4 (2019), flashes in L eye, hx TIA (12/2019), R shoulder surgery, paroxysmal atrial fib (takes metoprolol to slow heart down), gout, GERD, BPH, CAD, kidney stones, HTN, insomnia, hearing loss.  Patient denies hx of cancer, seizures, changes in bowel or bladder problems, new onset stumbling or dropping things, known weak bones/osteoporosis.    Limitations Sitting;Standing;House hold activities;Other (comment)   : creates a problem for taking care of everything at the home including his wife who has dementia (bathing, dressing, meals, etc), sleeping, sitting, traveling, going up stairs, walking up a steep grade.   How long can you sit comfortably? not more than 30 min    Diagnostic tests Note from Verlin Dike NP at [facility] 11/13/2020:  Reviewd lumbar MRI - overall his MRI looks pretty good. He does have some foraminal stenosis on the right at L3. Reviewed four view lumbar  x-ray-no dynamic instability noted ion flexion-extnesion.   Impression lumbar radiculopathy vs peroneal neuropathy, vs hip pathology?  Ordered  R LE EMG/NCV to be performed.    Patient Stated Goals "to get a little less pain"    Currently in Pain? Yes    Pain Score 3     Pain Onset More than a month ago                TREATMENT:  Per documentation on 11/21/20, pt advised by PCP Dr. Charlotta Newton "NOT to do push ups or any aggressive / strenuous back exercises, ok to do stretches and mild exercise for back" and "Ok to get PT and exercise at home twice a day" Denies bilateral THA   Therapeutic exercise: to centralize symptoms and improve ROM, strength, muscular endurance, and activity tolerance required for successful completion of functional activities.  - Treadmill up to 1.5 mph with 0% grade. For improved lower extremity mobility,  muscular endurance, and weightbearing activity tolerance; and to induce the analgesic effect of aerobic exercise, stimulate improved joint nutrition, and prepare body structures and systems for following interventions. x 5:30  minutes. - standing wall sags 4x20, (Reports pain in calf down to 0/10 pain moved up to lateral knee, right shoulder aching; 4th set following lumbar extension over table).  - standing lumbar extension over table, 3x30 (pain centralizing to R SIJ by end of 3 sets but then started to perpheralize again after walking).   (Manual therapy - see below) - supine hamstring stretch with strap, 3x30 seconds each side.  - Education on HEP including handout    Manual therapy: to reduce pain and tissue tension, improve range of motion, neuromodulation, in  order to promote improved ability to complete functional activities. PRONE with pillow under chest.  - CPA grade II-IV with and without KE wedge at T5-L5    Pt required multimodal cuing for proper technique and to facilitate improved neuromuscular control, strength, range of motion, and functional ability resulting in improved performance and form.   HOME EXERCISE PROGRAM Access Code: [email or web address] URL: [email or web address] Date: 12/10/2020 Prepared by: Norton Blizzard  Exercises Seated Correct Posture Standing Lumbar Extension at Wall - Forearms - 1 sets - 20-30 reps - 1 second hold - every 2 hours Hamstring stretch (with strap) - 1 x daily - 3 reps - 30 seconds hold    CHAIR HIP FLEXOR STRETCH  [[id number]]  Seated Hip Flexor Stretch  -  Repeat 3 Times, Hold 30 Seconds, Perform 1 Times a Day   PT Education - 12/10/20 1038     Education Details Exercise purpose/form. Self management techniques.    Person(s) Educated Patient    Methods Explanation;Demonstration;Tactile cues;Verbal cues    Comprehension Verbalized understanding;Returned demonstration;Verbal cues required;Tactile cues required;Need further instruction               PT Short Term Goals - 11/21/20 1748       PT SHORT TERM GOAL #1   Title Be independent with initial home exercise program for self-management of symptoms.    Baseline Initial HEP provied at IE (11/19/2020);    Time 2    Period Weeks    Status Achieved    Target Date 12/03/20               PT Long Term Goals - 11/19/20 1426       PT LONG TERM GOAL #1   Title Be independent with a long-term home exercise program for self-management of symptoms.    Baseline Initial HEP provided at IE (11/19/2020);    Time 12    Period Weeks    Status New   TARGET DATE FOR ALL LONG TERM GOALS: 02/11/2021     PT LONG TERM GOAL #2   Title Demonstrate improved FOTO to equal or greater than 65 by visit #12 to demonstrate improvement in overall condition and self-reported functional ability.    Baseline 60 (11/19/2020);    Time 12    Period Weeks    Status New      PT LONG TERM GOAL #3   Title Pateint will demonstrate equal or greater than 4+/5 hip strength in bilateral hip abduciton to improve lumbopelvic stability during funcitonal activities such as climbing stairs.    Baseline R = 4+/5, L = 4/5 (11/19/2020);    Time 12    Period Weeks    Status New      PT LONG TERM GOAL #4   Title Reduce pain with functional activities to equal or less than 1/10 to allow patient to complete usual activities including sleeping, caregiving, sitting, traveling with less difficulty.    Baseline up to 10/10 (11/19/2020);    Time 12    Period Weeks    Status New      PT LONG TERM GOAL #5   Title Complete community, work and/or recreational activities without limitation due to current condition.    Baseline Functional Limitations: creates a problem for taking care of everything at the home including his wife who has dementia (bathing, dressing, meals, etc), sleeping, sitting, traveling, going up stairs, walking up a steep grade (11/19/2020);    Time 12    Period Weeks  Status New       Additional Long Term Goals   Additional Long Term Goals Yes      PT LONG TERM GOAL #6   Title Patient will improve lumbar extension to equal or greater than 50% to improve mobility for functional activity.    Baseline Extension: 25% or less (11/19/2020);    Time 12    Period Weeks    Status New                   Plan - 12/10/20 1055     Clinical Impression Statement Patient able to centralize pain with standing lumbar extension exercises but took many rep. He is only able to tolerate a limited amount of wall sags due to pressure at shoulders so he was able to substitute lumbar extension over the table with continued improvements. Introduced hamstring stretch to decrease pull into lumbar flexion during stooping activities.  Reports no pain by end of session. Patient would benefit from continued management of limiting condition by skilled physical therapist to address remaining impairments and functional limitations to work towards stated goals and return to PLOF or maximal functional independence.    Personal Factors and Comorbidities Age;Past/Current Experience;Time since onset of injury/illness/exacerbation    Examination-Activity Limitations Stairs;Caring for Others;Sit;Sleep    Examination-Participation Restrictions Cleaning;Laundry;Shop;Community Activity;Meal Prep;Interpersonal Relationship;Other   creates a problem for taking care of everything at the home including his wife who has dementia (bathing, dressing, meals, etc), sleeping, sitting, traveling, going up stairs, walking up a steep grade.   Stability/Clinical Decision Making Stable/Uncomplicated    Rehab Potential Good    PT Frequency 2x / week    PT Duration 12 weeks    PT Treatment/Interventions ADLs/Self Care Home Management;Cryotherapy;Moist Heat;Electrical Stimulation;Gait training;Stair training;Functional mobility training;Neuromuscular re-education;Balance training;Therapeutic exercise;Therapeutic  activities;Patient/family education;Manual techniques;Dry needling;Passive range of motion;Joint Manipulations    PT Next Visit Plan update HEP as appropriate, specific exercise, LE/core/functional strengthening, manual therapy as needed    PT Home Exercise Plan Medbridge Access Code: [email or web address]    Consulted and Agree with Plan of Care Patient             Patient will benefit from skilled therapeutic intervention in order to improve the following deficits and impairments:  Improper body mechanics, Pain, Postural dysfunction, Increased muscle spasms, Decreased mobility, Decreased activity tolerance, Decreased endurance, Decreased range of motion, Decreased strength, Hypomobility, Impaired perceived functional ability, Impaired flexibility  Visit Diagnosis: Chronic right-sided low back pain with right-sided sciatica  Muscle weakness (generalized)     Problem List Patient Active Problem List   Diagnosis Date Noted   Chronic low back pain without sciatica 11/21/2020   Hearing loss    Kidney stones    Aortic atherosclerosis (HCC) 03/24/2020   History of 2019 novel coronavirus disease (COVID-19) 03/24/2020   History of stroke 10/05/2019   S/P epidural steroid injection 05/2018   PAF (paroxysmal atrial fibrillation) (HCC) 08/07/2017   CAD (coronary artery disease) 08/07/2017   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 Ilsa Iha, PT, DPT 12/10/20, 11:18 AM   [place] [facility] PHYSICAL AND SPORTS MEDICINE [address], [place], [address] Phone: [phone]   Fax:  [phone]  Name: Thomas Miller MRN: [id number] Date of Birth: 1950/02/21

## 2020-12-11 ENCOUNTER — Encounter: Payer: Medicare Other | Admitting: Physical Therapy

## 2020-12-11 DIAGNOSIS — M79604 Pain in right leg: Secondary | ICD-10-CM | POA: Diagnosis not present

## 2020-12-11 DIAGNOSIS — Z6830 Body mass index (BMI) 30.0-30.9, adult: Secondary | ICD-10-CM | POA: Insufficient documentation

## 2020-12-11 HISTORY — DX: Body mass index (BMI) 30.0-30.9, adult: Z68.30

## 2020-12-12 ENCOUNTER — Ambulatory Visit: Payer: Medicare Other | Admitting: Physical Therapy

## 2020-12-13 ENCOUNTER — Encounter: Payer: Medicare Other | Admitting: Physical Therapy

## 2020-12-14 ENCOUNTER — Other Ambulatory Visit: Payer: Self-pay | Admitting: Nurse Practitioner

## 2020-12-14 DIAGNOSIS — E7800 Pure hypercholesterolemia, unspecified: Secondary | ICD-10-CM

## 2020-12-14 DIAGNOSIS — M1000 Idiopathic gout, unspecified site: Secondary | ICD-10-CM

## 2020-12-14 NOTE — Telephone Encounter (Signed)
Requested Prescriptions  Pending Prescriptions Disp Refills  . atorvastatin (LIPITOR) 80 MG tablet [Pharmacy Med Name: ATORVASTATIN 80 MG TABLET] 90 tablet 2    Sig: Take 1 tablet (80 mg total) by mouth daily at 6 PM.     Cardiovascular:  Antilipid - Statins Passed - 12/14/2020 12:37 PM      Passed - Total Cholesterol in normal range and within 360 days    Cholesterol, Total  Date Value Ref Range Status  10/03/2020 129 100 - 199 mg/dL Final   Cholesterol Piccolo, Waived  Date Value Ref Range Status  06/03/2017 142 <200 mg/dL Final    Comment:                            Desirable                <200                         Borderline High      200- 239                         High                     >239          Passed - LDL in normal range and within 360 days    LDL Chol Calc (NIH)  Date Value Ref Range Status  10/03/2020 65 0 - 99 mg/dL Final         Passed - HDL in normal range and within 360 days    HDL  Date Value Ref Range Status  10/03/2020 43 >39 mg/dL Final         Passed - Triglycerides in normal range and within 360 days    Triglycerides  Date Value Ref Range Status  10/03/2020 118 0 - 149 mg/dL Final   Triglycerides Piccolo,Waived  Date Value Ref Range Status  06/03/2017 123 <150 mg/dL Final    Comment:                            Normal                   <150                         Borderline High     150 - 199                         High                200 - 499                         Very High                >499          Passed - Patient is not pregnant      Passed - Valid encounter within last 12 months    Recent Outpatient Visits          3 weeks ago Chronic low back pain without sciatica, unspecified back pain laterality   [facility] Vigg, Avanti, MD   1 month ago Sciatica of right  side   [facility] Vigg, Avanti, MD   1 month ago Coronary artery disease involving native coronary artery of native heart without  angina pectoris   [facility] Vigg, Avanti, MD   4 months ago Primary hypertension   [facility] Vigg, Avanti, MD   8 months ago Primary hypertension   [facility] [place], Dorie Rank, NP      Future Appointments            In 3 weeks Vigg, Avanti, MD [facility]   In 2 months Revankar, Aundra Dubin, MD [facility] [place]   In 10 months  [facility], [facility]           . losartan (COZAAR) 25 MG tablet [Pharmacy Med Name: LOSARTAN POTASSIUM 25 MG TAB] 90 tablet 0    Sig: Take 1 tablet (25 mg total) by mouth daily.     Cardiovascular:  Angiotensin Receptor Blockers Passed - 12/14/2020 12:37 PM      Passed - Cr in normal range and within 180 days    Creatinine, Ser  Date Value Ref Range Status  10/03/2020 1.00 0.76 - 1.27 mg/dL Final         Passed - K in normal range and within 180 days    Potassium  Date Value Ref Range Status  10/03/2020 4.4 3.5 - 5.2 mmol/L Final         Passed - Patient is not pregnant      Passed - Last BP in normal range    BP Readings from Last 1 Encounters:  11/21/20 125/80         Passed - Valid encounter within last 6 months    Recent Outpatient Visits          3 weeks ago Chronic low back pain without sciatica, unspecified back pain laterality   [facility] Vigg, Avanti, MD   1 month ago Sciatica of right side   [facility] Vigg, Avanti, MD   1 month ago Coronary artery disease involving native coronary artery of native heart without angina pectoris   [facility] Vigg, Avanti, MD   4 months ago Primary hypertension   [facility] Vigg, Avanti, MD   8 months ago Primary hypertension   [facility] [place], Dorie Rank, NP      Future Appointments            In 3 weeks Vigg, Avanti, MD [facility], [facility]   In 2 months Revankar, Aundra Dubin, MD [facility] [place]   In 10 months  [facility], [facility]           . allopurinol (ZYLOPRIM) 300 MG tablet [Pharmacy Med Name: ALLOPURINOL 300 MG TABLET] 90 tablet 0    Sig: Take 1 tablet (300 mg total) by mouth daily.     Endocrinology:  Gout Agents Failed - 12/14/2020 12:37 PM      Failed - Uric Acid in normal range and within 360 days    Uric Acid  Date Value Ref Range Status  03/28/2019 3.2 (L) 3.8 - 8.4 mg/dL Final    Comment:               Therapeutic target for gout patients: <6.0         Passed - Cr in normal range and within 360 days    Creatinine, Ser  Date Value Ref Range Status  10/03/2020 1.00 0.76 - 1.27 mg/dL Final         Passed - Valid encounter within last 12 months    Recent Outpatient Visits          3 weeks ago Chronic low back pain without sciatica, unspecified back pain laterality   [facility] Vigg, Avanti, MD   1 month ago Sciatica of right side   [facility] Vigg, Avanti, MD   1 month ago Coronary artery disease involving native coronary artery of native heart without angina pectoris   [facility] Vigg, Avanti, MD   4 months ago Primary hypertension   [facility] Vigg, Avanti, MD   8 months ago Primary hypertension   [facility] [place], [place] T, NP      Future Appointments            In 3 weeks Vigg, Avanti, MD [facility], [facility]   In 2 months Revankar, Aundra Dubin, MD [facility] [place]   In 10 months  [facility], [facility]           . metoprolol tartrate (LOPRESSOR) 25 MG tablet [Pharmacy Med Name: METOPROLOL TARTRATE 25 MG TAB] 180 tablet 0    Sig: Take 1 tablet (25 mg total) by mouth 2 (two) times daily.     Cardiovascular:  Beta Blockers Passed - 12/14/2020 12:37 PM      Passed - Last BP in normal range    BP Readings from Last 1 Encounters:  11/21/20 125/80         Passed - Last Heart Rate in normal range    Pulse Readings from Last 1 Encounters:  11/21/20 60         Passed -  Valid encounter within last 6 months    Recent Outpatient Visits          3 weeks ago Chronic low back pain without sciatica, unspecified back pain laterality   [facility] Vigg, Avanti, MD   1 month ago Sciatica of right side   [facility] Vigg, Avanti, MD   1 month ago Coronary artery disease involving native coronary artery of native heart without angina pectoris   [facility] Vigg, Avanti, MD   4 months ago Primary hypertension   [facility] Vigg, Avanti, MD   8 months ago Primary hypertension   [facility] [place], Dorie Rank, NP      Future Appointments            In 3 weeks Vigg, Avanti, MD [facility]   In 2 months Revankar, Aundra Dubin, MD [facility] [place]   In 10 months  [facility], [facility]

## 2020-12-18 ENCOUNTER — Encounter: Payer: Medicare Other | Admitting: Physical Therapy

## 2020-12-19 ENCOUNTER — Encounter: Payer: Medicare Other | Admitting: Physical Therapy

## 2020-12-24 ENCOUNTER — Ambulatory Visit: Payer: Medicare Other | Admitting: Physical Therapy

## 2020-12-25 ENCOUNTER — Encounter: Payer: Medicare Other | Admitting: Physical Therapy

## 2020-12-26 ENCOUNTER — Ambulatory Visit: Payer: Medicare Other | Admitting: Physical Therapy

## 2020-12-26 ENCOUNTER — Telehealth: Payer: Self-pay

## 2020-12-26 ENCOUNTER — Telehealth: Payer: Medicare Other

## 2020-12-26 NOTE — Telephone Encounter (Signed)
  Care Management   Follow Up Note   12/26/2020 Name: Thomas Miller MRN: [id number] DOB: 07-20-1950   Referred by: Loura Pardon, MD Reason for referral : Chronic Care Management (RNCM: Follow up for Chronic Disease Management and Care Coordination Needs )   An unsuccessful telephone outreach was attempted today. The patient was referred to the case management team for assistance with care management and care coordination. The patient actually answered the phone but stated he was taking his wife to the doctor and ask for a reschedule.   Follow Up Plan: Telephone follow up appointment with care management team member scheduled for: 02-01-2021 at 0945 am  Alto Denver RN, MSN, CCM Community Care Coordinator [place]  [facility] Mobile: [phone]

## 2020-12-27 ENCOUNTER — Telehealth: Payer: Self-pay | Admitting: Physical Therapy

## 2020-12-27 ENCOUNTER — Encounter: Payer: Medicare Other | Admitting: Physical Therapy

## 2020-12-27 ENCOUNTER — Ambulatory Visit: Payer: Medicare Other | Attending: Internal Medicine | Admitting: Physical Therapy

## 2020-12-27 DIAGNOSIS — M5441 Lumbago with sciatica, right side: Secondary | ICD-10-CM | POA: Insufficient documentation

## 2020-12-27 DIAGNOSIS — G8929 Other chronic pain: Secondary | ICD-10-CM | POA: Insufficient documentation

## 2020-12-27 DIAGNOSIS — M6281 Muscle weakness (generalized): Secondary | ICD-10-CM | POA: Insufficient documentation

## 2020-12-27 NOTE — Telephone Encounter (Signed)
Called patient when he did not show up for today's appointment scheduled at 11:15am. Left VM notifying pt of missed appointment, his next scheduled visit Monday 12/31/2020 at 9am, and that we currently have openings today at 4:45pm and 6:15pm if he would like either of those times to make up missed appointment this morning. Requested call back.   Luretha Murphy. Ilsa Iha, PT, DPT 12/27/20, 11:59 AM

## 2020-12-31 ENCOUNTER — Ambulatory Visit: Payer: Medicare Other | Admitting: Physical Therapy

## 2020-12-31 ENCOUNTER — Encounter: Payer: Self-pay | Admitting: Physical Therapy

## 2020-12-31 DIAGNOSIS — M5441 Lumbago with sciatica, right side: Secondary | ICD-10-CM | POA: Diagnosis not present

## 2020-12-31 DIAGNOSIS — G8929 Other chronic pain: Secondary | ICD-10-CM

## 2020-12-31 DIAGNOSIS — M6281 Muscle weakness (generalized): Secondary | ICD-10-CM

## 2020-12-31 NOTE — Therapy (Signed)
[place] [facility] PHYSICAL AND SPORTS MEDICINE [address] [place], [address] Phone: [phone]   Fax:  [phone]  Physical Therapy Treatment  Patient Details  Name: Thomas Miller MRN: [id number] Date of Birth: 1951-01-15 Referring Provider (PT): Loura Pardon, MD   Encounter Date: 12/31/2020   PT End of Session - 12/31/20 0912     Visit Number 7    Number of Visits 24    Date for PT Re-Evaluation 02/11/21    Authorization Type [employer] MEDICARE reporting period from 11/19/2020    Progress Note Due on Visit 10    PT Start Time 0903    PT Stop Time 0943    PT Time Calculation (min) 40 min    Activity Tolerance Patient tolerated treatment well    Behavior During Therapy [facility] for tasks assessed/performed             Past Medical History:  Diagnosis Date   Advanced care planning/counseling discussion 11/27/2016   Aortic atherosclerosis (HCC) 03/24/2020   Noted on CT 03/01/19   BPH (benign prostatic hyperplasia) 10/25/2014   CAD (coronary artery disease) 08/07/2017   GERD (gastroesophageal reflux disease) 10/25/2014   Gout    Hearing loss    History of 2019 novel coronavirus disease (COVID-19) 03/24/2020   On 02/09/20   History of stroke 10/05/2019   Hyperlipemia 10/25/2014   Hypertension    Insomnia 05/19/2016   Kidney stones    PAF (paroxysmal atrial fibrillation) (HCC) 08/07/2017   Reflux 10/25/2014   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   S/P epidural steroid injection 05/2018    Past Surgical History:  Procedure Laterality Date   BACK SURGERY     CORONARY ARTERY BYPASS GRAFT N/A 07/20/2017   Procedure: CORONARY ARTERY BYPASS GRAFTING (CABG) times four using left internal mammary artery to LAD and endoscopically harvested right saphenous vein graft to distal circumflex, intermediate, and PD;  Surgeon: [name], MD;  Location: [facility] OR;  Service: Open Heart Surgery;  Laterality: N/A;   EPIDURAL BLOCK INJECTION  05/2018   L3injections     EYE SURGERY Bilateral 2013   FOOT SURGERY Bilateral    ingrown toenails   GALLBLADDER SURGERY     LEFT HEART CATH AND CORONARY ANGIOGRAPHY N/A 07/17/2017   Procedure: LEFT HEART CATH AND CORONARY ANGIOGRAPHY;  Surgeon: Yvonne Kendall, MD;  Location: [facility] INVASIVE CV LAB;  Service: Cardiovascular;  Laterality: N/A;   SHOULDER SURGERY Right    SPINE SURGERY      There were no vitals filed for this visit.   Subjective Assessment - 12/31/20 0906     Subjective Pateint reports he has 4/10 pain in the right glute to lateral calf upon arrival. States this pain awoke him at 2am this morning at a 9/10 intensity and he walked in his basement for about 30 min. He took 500mg  tylenol before he walked and was able to go back to bed. Last week was too busy for him to come to PT. His HEP is going pretty good. He was doing them about every 3-4 hours and sometimes more than that. He states this has helped control his pain more but it still wakes him up at night. He usually sleeps on L side. He tries to sleep on R side but pain gets too intense.    Pertinent History Patient is a 70 y.o. male who presents to outpatient physical therapy with a referral for medical diagnosis sciatica of right side. This patient's  chief complaints consist of right sided low back, hip, thigh and leg pain/paresthesia leading to the following functional deficits: creates a problem for taking care of everything at the home including his wife who has dementia (bathing, dressing, meals, etc), sleeping, sitting, traveling, going up stairs, walking up a steep grade.  Relevant past medical history and comorbidities include CABG x4 (2019), flashes in L eye, hx TIA (12/2019), R shoulder surgery, paroxysmal atrial fib (takes metoprolol to slow heart down), gout, GERD, BPH, CAD, kidney stones, HTN, insomnia, hearing loss.  Patient denies hx of cancer, seizures, changes in bowel or bladder problems, new onset stumbling or dropping things, known weak  bones/osteoporosis.    Limitations Sitting;Standing;House hold activities;Other (comment)   : creates a problem for taking care of everything at the home including his wife who has dementia (bathing, dressing, meals, etc), sleeping, sitting, traveling, going up stairs, walking up a steep grade.   How long can you sit comfortably? not more than 30 min    Diagnostic tests Note from Verlin Dike NP at [facility] 11/13/2020:  Reviewd lumbar MRI - overall his MRI looks pretty good. He does have some foraminal stenosis on the right at L3. Reviewed four view lumbar  x-ray-no dynamic instability noted ion flexion-extnesion.   Impression lumbar radiculopathy vs peroneal neuropathy, vs hip pathology?  Ordered  R LE EMG/NCV to be performed.    Patient Stated Goals "to get a little less pain"    Currently in Pain? Yes    Pain Score 4     Pain Onset More than a month ago              TREATMENT:  Per documentation on 11/21/20, pt advised by PCP Dr. Charlotta Newton "NOT to do push ups or any aggressive / strenuous back exercises, ok to do stretches and mild exercise for back" and "Ok to get PT and exercise at home twice a day" Denies bilateral THA   Therapeutic exercise: to centralize symptoms and improve ROM, strength, muscular endurance, and activity tolerance required for successful completion of functional activities.  - Treadmill up to 1.5 mph with 0% grade. For improved lower extremity mobility, muscular endurance, and weightbearing activity tolerance; and to induce the analgesic effect of aerobic exercise, stimulate improved joint nutrition, and prepare body structures and systems for following interventions. x 6:17  minutes. - standing wall sags 2x50, (pain centralized to R glute, 4/10).  - education on lumbar night roll with trial on plinth with towels rolled up to simulate it - handout provided.  - standing lumbar extension over TM bar, 1x70 (pain decreasing to 2/10 at R glute), 1x27 (stopped  because pain started burning down R leg but pain at glute stayed less than 2x10).  - standing lumbar side glide to right (left hip to wall), 1x11 (stopped due to peripherlizing to R knee).  - wall sags, 1x20 (improved pain at knee).    Manual therapy: to reduce pain and tissue tension, improve range of motion, neuromodulation, in order to promote improved ability to complete functional activities. PRONE with 3 pillows under chest - CPA grade II-IV with KE wedge at T12-L5, without wedge at sacrum (patient reported decreased and/or increased R leg symptoms, mostly decreased at upper lumbar and when over sacrum).  - STM to right glute and posterior hip musculature - non tender - STM to right SIJ region and upper glute max  - TTP grade I   Pt required multimodal cuing for proper technique and to  facilitate improved neuromuscular control, strength, range of motion, and functional ability resulting in improved performance and form.   HOME EXERCISE PROGRAM Access Code: [email or web address] URL: [email or web address] Date: 12/10/2020 Prepared by: Norton Blizzard   Exercises Seated Correct Posture Standing Lumbar Extension at Wall - Forearms - 1 sets - 20-30 reps - 1 second hold - every 2 hours Hamstring stretch (with strap) - 1 x daily - 3 reps - 30 seconds hold     CHAIR HIP FLEXOR STRETCH  [[id number]]   Seated Hip Flexor Stretch  -  Repeat 3 Times, Hold 30 Seconds, Perform 1 Times a Day    PT Education - 12/31/20 0909     Education Details Exercise purpose/form. Self management techniques.    Person(s) Educated Patient    Methods Explanation;Demonstration;Tactile cues;Verbal cues    Comprehension Verbalized understanding;Returned demonstration;Verbal cues required;Tactile cues required;Need further instruction              PT Short Term Goals - 11/21/20 1748       PT SHORT TERM GOAL #1   Title Be independent with initial home exercise program for self-management of symptoms.     Baseline Initial HEP provied at IE (11/19/2020);    Time 2    Period Weeks    Status Achieved    Target Date 12/03/20               PT Long Term Goals - 11/19/20 1426       PT LONG TERM GOAL #1   Title Be independent with a long-term home exercise program for self-management of symptoms.    Baseline Initial HEP provided at IE (11/19/2020);    Time 12    Period Weeks    Status New   TARGET DATE FOR ALL LONG TERM GOALS: 02/11/2021     PT LONG TERM GOAL #2   Title Demonstrate improved FOTO to equal or greater than 65 by visit #12 to demonstrate improvement in overall condition and self-reported functional ability.    Baseline 60 (11/19/2020);    Time 12    Period Weeks    Status New      PT LONG TERM GOAL #3   Title Pateint will demonstrate equal or greater than 4+/5 hip strength in bilateral hip abduciton to improve lumbopelvic stability during funcitonal activities such as climbing stairs.    Baseline R = 4+/5, L = 4/5 (11/19/2020);    Time 12    Period Weeks    Status New      PT LONG TERM GOAL #4   Title Reduce pain with functional activities to equal or less than 1/10 to allow patient to complete usual activities including sleeping, caregiving, sitting, traveling with less difficulty.    Baseline up to 10/10 (11/19/2020);    Time 12    Period Weeks    Status New      PT LONG TERM GOAL #5   Title Complete community, work and/or recreational activities without limitation due to current condition.    Baseline Functional Limitations: creates a problem for taking care of everything at the home including his wife who has dementia (bathing, dressing, meals, etc), sleeping, sitting, traveling, going up stairs, walking up a steep grade (11/19/2020);    Time 12    Period Weeks    Status New      Additional Long Term Goals   Additional Long Term Goals Yes      PT LONG TERM GOAL #6   Title Patient will  improve lumbar extension to equal or greater than 50% to improve  mobility for functional activity.    Baseline Extension: 25% or less (11/19/2020);    Time 12    Period Weeks    Status New                   Plan - 12/31/20 1014     Clinical Impression Statement Patient tolerated treatment well today and continues to have partial response to extension exercises resulting in pain reduction to 1/10 and centralization to right glute region by end of session. Patient's lumbar spine remains very stiff and patient has not had good carry over between visits despite increasing the frequency of lumbar extension exercises per his report. Educated on use of lumbar night roll to attempt to improve sleep. Again had good within-visit response to extension but unable to complete abolish symptoms. Poor response to trial of frontal plane specific exercises. Suggest transitioning to more strengthening exercises next session due to lack of long term or complete success with specific exercise for extension preference alone. Patient would benefit from continued management of limiting condition by skilled physical therapist to address remaining impairments and functional limitations to work towards stated goals and return to PLOF or maximal functional independence.    Personal Factors and Comorbidities Age;Past/Current Experience;Time since onset of injury/illness/exacerbation    Examination-Activity Limitations Stairs;Caring for Others;Sit;Sleep    Examination-Participation Restrictions Cleaning;Laundry;Shop;Community Activity;Meal Prep;Interpersonal Relationship;Other   creates a problem for taking care of everything at the home including his wife who has dementia (bathing, dressing, meals, etc), sleeping, sitting, traveling, going up stairs, walking up a steep grade.   Stability/Clinical Decision Making Stable/Uncomplicated    Rehab Potential Good    PT Frequency 2x / week    PT Duration 12 weeks    PT Treatment/Interventions ADLs/Self Care Home  Management;Cryotherapy;Moist Heat;Electrical Stimulation;Gait training;Stair training;Functional mobility training;Neuromuscular re-education;Balance training;Therapeutic exercise;Therapeutic activities;Patient/family education;Manual techniques;Dry needling;Passive range of motion;Joint Manipulations    PT Next Visit Plan update HEP as appropriate, specific exercise, LE/core/functional strengthening, manual therapy as needed    PT Home Exercise Plan Medbridge Access Code: [email or web address]    Consulted and Agree with Plan of Care Patient             Patient will benefit from skilled therapeutic intervention in order to improve the following deficits and impairments:  Improper body mechanics, Pain, Postural dysfunction, Increased muscle spasms, Decreased mobility, Decreased activity tolerance, Decreased endurance, Decreased range of motion, Decreased strength, Hypomobility, Impaired perceived functional ability, Impaired flexibility  Visit Diagnosis: Chronic right-sided low back pain with right-sided sciatica  Muscle weakness (generalized)     Problem List Patient Active Problem List   Diagnosis Date Noted   Chronic low back pain without sciatica 11/21/2020   Hearing loss    Kidney stones    Aortic atherosclerosis (HCC) 03/24/2020   History of 2019 novel coronavirus disease (COVID-19) 03/24/2020   History of stroke 10/05/2019   S/P epidural steroid injection 05/2018   PAF (paroxysmal atrial fibrillation) (HCC) 08/07/2017   CAD (coronary artery disease) 08/07/2017   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 Ilsa Iha, PT, DPT 12/31/20, 10:14 AM   [facility] PHYSICAL AND SPORTS MEDICINE [address] [place], [address] Phone:  [phone]   Fax:  [phone]  Name: Thomas Miller MRN: [id number] Date of Birth: 1950-09-09

## 2021-01-02 ENCOUNTER — Encounter: Payer: Self-pay | Admitting: Physical Therapy

## 2021-01-02 ENCOUNTER — Ambulatory Visit: Payer: Medicare Other | Admitting: Physical Therapy

## 2021-01-02 DIAGNOSIS — M6281 Muscle weakness (generalized): Secondary | ICD-10-CM | POA: Diagnosis not present

## 2021-01-02 DIAGNOSIS — M5441 Lumbago with sciatica, right side: Secondary | ICD-10-CM

## 2021-01-02 DIAGNOSIS — G8929 Other chronic pain: Secondary | ICD-10-CM

## 2021-01-02 NOTE — Therapy (Signed)
[place] [address] [facility] PHYS[facility] AND SPORTS MEDICINE [address], [place], [address] Phone: [phone]   Fax:  [phone]  Physical Therapy Treatment  Patient Details  Name: Thomas Miller MRN: [id number] Date of Birth: [birth year] Referring Provider (PT): Loura Pardon, MD   Encounter Date: 01/02/2021   PT End of Session - 01/02/21 0904     Visit Number 8    Number of Visits 24    Date for PT Re-Evaluation 02/11/21    Authorization Type [employer] MEDICARE reporting period from 11/19/2020    Progress Note Due on Visit 10    PT Start Time 0900    PT Stop Time 0940    PT Time Calculation (min) 40 min    Activity Tolerance Patient tolerated treatment well    Behavior During Therapy [facility] for tasks assessed/performed             Past Medical History:  Diagnosis Date   Advanced care planning/counseling discussion 11/27/2016   Aortic atherosclerosis (HCC) 03/24/2020   Noted on CT 03/01/19   BPH (benign prostatic hyperplasia) 10/25/2014   CAD (coronary artery disease) 08/07/2017   GERD (gastroesophageal reflux disease) 10/25/2014   Gout    Hearing loss    History of 2019 novel coronavirus disease (COVID-19) 03/24/2020   On 02/09/20   History of stroke 10/05/2019   Hyperlipemia 10/25/2014   Hypertension    Insomnia 05/19/2016   Kidney stones    PAF (paroxysmal atrial fibrillation) (HCC) 08/07/2017   Reflux 10/25/2014   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   S/P epidural steroid injection 05/2018    Past Surgical History:  Procedure Laterality Date   BACK SURGERY     CORONARY ARTERY BYPASS GRAFT N/A 07/20/2017   Procedure: CORONARY ARTERY BYPASS GRAFTING (CABG) times four using left internal mammary artery to LAD and endoscopically harvested right saphenous vein graft to distal circumflex, intermediate, and PD;  Surgeon: [name], MD;  Location: [facility] OR;  Service: Open Heart Surgery;  Laterality: N/A;   EPIDURAL BLOCK INJECTION  05/2018   L3injections     EYE SURGERY Bilateral 2013   FOOT SURGERY Bilateral    ingrown toenails   GALLBLADDER SURGERY     LEFT HEART CATH AND CORONARY ANGIOGRAPHY N/A 07/17/2017   Procedure: LEFT HEART CATH AND CORONARY ANGIOGRAPHY;  Surgeon: Yvonne Kendall, MD;  Location: [facility] INVASIVE CV LAB;  Service: Cardiovascular;  Laterality: N/A;   SHOULDER SURGERY Right    SPINE SURGERY      There were no vitals filed for this visit.   Subjective Assessment - 01/02/21 0902     Subjective Patient reports he is feeling a little better today. He slept until 3am yesterday morning and 4:30am this morning. He tried making a lumbar night roll with towels and it hurt a lot at his left hip the first night but he adjusted it and it was a bit better last night. He think he may look for one to purchase that is softer. States his pain is 1/10 in the right lateral thigh upon arrival. Spent several hours golfing yesterday (golfs on Tuesday and Thursday).    Pertinent History Patient is a 70 y.o. male who presents to outpatient physical therapy with a referral for medical diagnosis sciatica of right side. This patient's chief complaints consist of right sided low back, hip, thigh and leg pain/paresthesia leading to the following functional deficits: creates a problem for taking care of everything at the home including his  wife who has dementia (bathing, dressing, meals, etc), sleeping, sitting, traveling, going up stairs, walking up a steep grade.  Relevant past medical history and comorbidities include CABG x4 (2019), flashes in L eye, hx TIA (12/2019), R shoulder surgery, paroxysmal atrial fib (takes metoprolol to slow heart down), gout, GERD, BPH, CAD, kidney stones, HTN, insomnia, hearing loss.  Patient denies hx of cancer, seizures, changes in bowel or bladder problems, new onset stumbling or dropping things, known weak bones/osteoporosis.    Limitations Sitting;Standing;House hold activities;Other (comment)   : creates a problem for  taking care of everything at the home including his wife who has dementia (bathing, dressing, meals, etc), sleeping, sitting, traveling, going up stairs, walking up a steep grade.   How long can you sit comfortably? not more than 30 min    Diagnostic tests Note from Verlin Dike NP at [facility] 11/13/2020:  Reviewd lumbar MRI - overall his MRI looks pretty good. He does have some foraminal stenosis on the right at L3. Reviewed four view lumbar  x-ray-no dynamic instability noted ion flexion-extnesion.   Impression lumbar radiculopathy vs peroneal neuropathy, vs hip pathology?  Ordered  R LE EMG/NCV to be performed.    Patient Stated Goals "to get a little less pain"    Currently in Pain? Yes    Pain Score 1     Pain Onset More than a month ago             TREATMENT:  Per documentation on 11/21/20, pt advised by PCP Dr. Charlotta Newton "NOT to do push ups or any aggressive / strenuous back exercises, ok to do stretches and mild exercise for back" and "Ok to get PT and exercise at home twice a day" Denies bilateral THA   Therapeutic exercise: to centralize symptoms and improve ROM, strength, muscular endurance, and activity tolerance required for successful completion of functional activities.  - Treadmill up to 1.5 mph with 0% grade. For improved lower extremity mobility, muscular endurance, and weightbearing activity tolerance; and to induce the analgesic effect of aerobic exercise, stimulate improved joint nutrition, and prepare body structures and systems for following interventions. x 5  minutes. BUE support.  - standing wall sags 1x50, (pain slightly better, arms getting sore). - standing lumbar extension over TM bar, 1x50 (pain abolished except a bit of stiffness) - Prone hip extension with knee extended. 3x10 Cuing to keep knee flexed. Pt required time to learn and for transitions between sides.  - supine hamstring stretch with strap, 3x30 seconds each side - Education on HEP  including handout     Manual therapy: to reduce pain and tissue tension, improve range of motion, neuromodulation, in order to promote improved ability to complete functional activities. PRONE with 3 pillows under chest - CPA grade II-IV with KE wedge at T12-L5, without wedge at sacrum (patient reported decreased and/or increased R leg symptoms, mostly decreased at upper lumbar and when over sacrum).  - STM to right SIJ region and upper glute max  - TTP grade I   Pt required multimodal cuing for proper technique and to facilitate improved neuromuscular control, strength, range of motion, and functional ability resulting in improved performance and form.   HOME EXERCISE PROGRAM Access Code: [email or web address] URL: [email or web address] Date: 01/02/2021 Prepared by: Norton Blizzard  Exercises Seated Correct Posture Standing Lumbar Extension at Wall - Forearms - 1 sets - 20-30 reps - 1 second hold - every 2 hours Hamstring stretch (with strap) - 1 x daily - 3  reps - 30 seconds hold Prone Hip Extension - 1 x daily - 3 sets - 10 reps - 4 seconds hold      CHAIR HIP FLEXOR STRETCH  [[id number]]   Seated Hip Flexor Stretch  -  Repeat 3 Times, Hold 30 Seconds, Perform 1 Times a Day      PT Education - 01/02/21 0904     Education Details Exercise purpose/form. Self management techniques.    Person(s) Educated Patient    Methods Explanation;Demonstration;Tactile cues;Verbal cues    Comprehension Verbalized understanding;Returned demonstration;Verbal cues required;Tactile cues required;Need further instruction              PT Short Term Goals - 11/21/20 1748       PT SHORT TERM GOAL #1   Title Be independent with initial home exercise program for self-management of symptoms.    Baseline Initial HEP provied at IE (11/19/2020);    Time 2    Period Weeks    Status Achieved    Target Date 12/03/20               PT Long Term Goals - 11/19/20 1426       PT LONG TERM GOAL  #1   Title Be independent with a long-term home exercise program for self-management of symptoms.    Baseline Initial HEP provided at IE (11/19/2020);    Time 12    Period Weeks    Status New   TARGET DATE FOR ALL LONG TERM GOALS: 02/11/2021     PT LONG TERM GOAL #2   Title Demonstrate improved FOTO to equal or greater than 65 by visit #12 to demonstrate improvement in overall condition and self-reported functional ability.    Baseline 60 (11/19/2020);    Time 12    Period Weeks    Status New      PT LONG TERM GOAL #3   Title Pateint will demonstrate equal or greater than 4+/5 hip strength in bilateral hip abduciton to improve lumbopelvic stability during funcitonal activities such as climbing stairs.    Baseline R = 4+/5, L = 4/5 (11/19/2020);    Time 12    Period Weeks    Status New      PT LONG TERM GOAL #4   Title Reduce pain with functional activities to equal or less than 1/10 to allow patient to complete usual activities including sleeping, caregiving, sitting, traveling with less difficulty.    Baseline up to 10/10 (11/19/2020);    Time 12    Period Weeks    Status New      PT LONG TERM GOAL #5   Title Complete community, work and/or recreational activities without limitation due to current condition.    Baseline Functional Limitations: creates a problem for taking care of everything at the home including his wife who has dementia (bathing, dressing, meals, etc), sleeping, sitting, traveling, going up stairs, walking up a steep grade (11/19/2020);    Time 12    Period Weeks    Status New      Additional Long Term Goals   Additional Long Term Goals Yes      PT LONG TERM GOAL #6   Title Patient will improve lumbar extension to equal or greater than 50% to improve mobility for functional activity.    Baseline Extension: 25% or less (11/19/2020);    Time 12    Period Weeks    Status New  Plan - 01/02/21 0943     Clinical Impression  Statement Patient tolerated treatment well overall and continues to demonstrate improvement within visit using specific exercise for extension preference. Completed some core and glute strengthening this session to help reinforce more lasting improvement. Afterwards patinet reports he is already doing these exercises at home. Patient's back and leg pain was abolished by end of session. Discussed more frequent extension exercises throughout the day and patient stated this is unrealistic for him due to all of the responsibilities he has each day.He continues to complete several rounds in the morning and evening combined with walking. Patient would benefit from continued management of limiting condition by skilled physical therapist to address remaining impairments and functional limitations to work towards stated goals and return to PLOF or maximal functional independence.    Personal Factors and Comorbidities Age;Past/Current Experience;Time since onset of injury/illness/exacerbation    Examination-Activity Limitations Stairs;Caring for Others;Sit;Sleep    Examination-Participation Restrictions Cleaning;Laundry;Shop;Community Activity;Meal Prep;Interpersonal Relationship;Other   creates a problem for taking care of everything at the home including his wife who has dementia (bathing, dressing, meals, etc), sleeping, sitting, traveling, going up stairs, walking up a steep grade.   Stability/Clinical Decision Making Stable/Uncomplicated    Rehab Potential Good    PT Frequency 2x / week    PT Duration 12 weeks    PT Treatment/Interventions ADLs/Self Care Home Management;Cryotherapy;Moist Heat;Electrical Stimulation;Gait training;Stair training;Functional mobility training;Neuromuscular re-education;Balance training;Therapeutic exercise;Therapeutic activities;Patient/family education;Manual techniques;Dry needling;Passive range of motion;Joint Manipulations    PT Next Visit Plan update HEP as appropriate,  specific exercise, LE/core/functional strengthening, manual therapy as needed    PT Home Exercise Plan Medbridge Access Code: [email or web address]    Consulted and Agree with Plan of Care Patient             Patient will benefit from skilled therapeutic intervention in order to improve the following deficits and impairments:  Improper body mechanics, Pain, Postural dysfunction, Increased muscle spasms, Decreased mobility, Decreased activity tolerance, Decreased endurance, Decreased range of motion, Decreased strength, Hypomobility, Impaired perceived functional ability, Impaired flexibility  Visit Diagnosis: Chronic right-sided low back pain with right-sided sciatica  Muscle weakness (generalized)     Problem List Patient Active Problem List   Diagnosis Date Noted   Chronic low back pain without sciatica 11/21/2020   Hearing loss    Kidney stones    Aortic atherosclerosis (HCC) 03/24/2020   History of 2019 novel coronavirus disease (COVID-19) 03/24/2020   History of stroke 10/05/2019   S/P epidural steroid injection 05/2018   PAF (paroxysmal atrial fibrillation) (HCC) 08/07/2017   CAD (coronary artery disease) 08/07/2017   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 Ilsa Iha, PT, DPT 01/02/21, 9:45 AM   [place] [facility] PHYSICAL AND SPORTS MEDICINE [address], [place], [address] Phone: [phone]   Fax:  [phone]  Name: Thomas Miller MRN: [id number] Date of Birth: 1950-04-22

## 2021-01-07 ENCOUNTER — Ambulatory Visit (INDEPENDENT_AMBULATORY_CARE_PROVIDER_SITE_OTHER): Payer: Medicare Other | Admitting: Internal Medicine

## 2021-01-07 ENCOUNTER — Encounter: Payer: Self-pay | Admitting: Internal Medicine

## 2021-01-07 ENCOUNTER — Other Ambulatory Visit: Payer: Self-pay

## 2021-01-07 VITALS — BP 125/79 | HR 52 | Temp 98.1°F | Ht 68.9 in | Wt 213.2 lb

## 2021-01-07 DIAGNOSIS — I2510 Atherosclerotic heart disease of native coronary artery without angina pectoris: Secondary | ICD-10-CM | POA: Diagnosis not present

## 2021-01-07 DIAGNOSIS — E782 Mixed hyperlipidemia: Secondary | ICD-10-CM

## 2021-01-07 DIAGNOSIS — M545 Low back pain, unspecified: Secondary | ICD-10-CM

## 2021-01-07 DIAGNOSIS — G8929 Other chronic pain: Secondary | ICD-10-CM | POA: Diagnosis not present

## 2021-01-07 NOTE — Progress Notes (Signed)
BP 125/79   Pulse (!) 52   Temp 98.1 F (36.7 C) (Oral)   Ht 5' 8.9" (1.75 m)   Wt 213 lb 3.2 oz (96.7 kg)   SpO2 99%   BMI 31.58 kg/m    Subjective:    Patient ID: Thomas Miller, male    DOB: Jul 08, 1950, 70 y.o.   MRN: [id number]  Chief Complaint  Patient presents with  . Back Pain  . Coronary Artery Disease  . Hypertension  . Hip Pain    Is currently doing PT in [place]    HPI: Thomas Miller is a 70 y.o. male  Back Pain This is a chronic (feels better since starting physical therapy once a week,) problem. The problem has been gradually improving since onset.  Coronary Artery Disease Presents for follow-up visit. Risk factors include hypertension.  Hypertension This is a chronic problem.  Hip Pain    Chief Complaint  Patient presents with  . Back Pain  . Coronary Artery Disease  . Hypertension  . Hip Pain    Is currently doing PT in [place]    Relevant past medical, surgical, family and social history reviewed and updated as indicated. Interim medical history since our last visit reviewed. Allergies and medications reviewed and updated.  Review of Systems  Musculoskeletal:  Positive for back pain.   Per HPI unless specifically indicated above     Objective:    BP 125/79   Pulse (!) 52   Temp 98.1 F (36.7 C) (Oral)   Ht 5' 8.9" (1.75 m)   Wt 213 lb 3.2 oz (96.7 kg)   SpO2 99%   BMI 31.58 kg/m   Wt Readings from Last 3 Encounters:  01/07/21 213 lb 3.2 oz (96.7 kg)  11/21/20 206 lb 3.2 oz (93.5 kg)  11/08/20 205 lb 9.6 oz (93.3 kg)    Physical Exam  Results for orders placed or performed in visit on 10/03/20  CMP14+EGFR  Result Value Ref Range   Glucose 85 65 - 99 mg/dL   BUN 17 8 - 27 mg/dL   Creatinine, Ser 1.00 0.76 - 1.27 mg/dL   eGFR 80 >59 mL/min/1.73   BUN/Creatinine Ratio 17 10 - 24   Sodium 141 134 - 144 mmol/L   Potassium 4.4 3.5 - 5.2 mmol/L   Chloride 102 96 - 106 mmol/L   CO2 22 20 - 29 mmol/L   Calcium 9.8 8.6 -  10.2 mg/dL   Total Protein 6.7 6.0 - 8.5 g/dL   Albumin 4.8 (H) 3.7 - 4.7 g/dL   Globulin, Total 1.9 1.5 - 4.5 g/dL   Albumin/Globulin Ratio 2.5 (H) 1.2 - 2.2   Bilirubin Total 0.8 0.0 - 1.2 mg/dL   Alkaline Phosphatase 96 44 - 121 IU/L   AST 25 0 - 40 IU/L   ALT 23 0 - 44 IU/L  Lipid panel  Result Value Ref Range   Cholesterol, Total 129 100 - 199 mg/dL   Triglycerides 118 0 - 149 mg/dL   HDL 43 >39 mg/dL   VLDL Cholesterol Cal 21 5 - 40 mg/dL   LDL Chol Calc (NIH) 65 0 - 99 mg/dL   Chol/HDL Ratio 3.0 0.0 - 5.0 ratio  Thyroid Panel With TSH  Result Value Ref Range   TSH 1.790 0.450 - 4.500 uIU/mL   T4, Total 8.8 4.5 - 12.0 ug/dL   T3 Uptake Ratio 25 24 - 39 %   Free Thyroxine Index 2.2 1.2 - 4.9  CBC with Differential/Platelet  Result Value Ref Range   WBC 7.3 3.4 - 10.8 x10E3/uL   RBC 5.34 4.14 - 5.80 x10E6/uL   Hemoglobin 15.7 13.0 - 17.7 g/dL   Hematocrit 47.2 37.5 - 51.0 %   MCV 88 79 - 97 fL   MCH 29.4 26.6 - 33.0 pg   MCHC 33.3 31.5 - 35.7 g/dL   RDW 13.3 11.6 - 15.4 %   Platelets 182 150 - 450 x10E3/uL   Neutrophils 53 Not Estab. %   Lymphs 34 Not Estab. %   Monocytes 10 Not Estab. %   Eos 2 Not Estab. %   Basos 1 Not Estab. %   Neutrophils Absolute 3.9 1.4 - 7.0 x10E3/uL   Lymphocytes Absolute 2.5 0.7 - 3.1 x10E3/uL   Monocytes Absolute 0.7 0.1 - 0.9 x10E3/uL   EOS (ABSOLUTE) 0.2 0.0 - 0.4 x10E3/uL   Basophils Absolute 0.1 0.0 - 0.2 x10E3/uL   Immature Granulocytes 0 Not Estab. %   Immature Grans (Abs) 0.0 0.0 - 0.1 x10E3/uL        Current Outpatient Medications:  .  allopurinol (ZYLOPRIM) 300 MG tablet, Take 1 tablet (300 mg total) by mouth daily., Disp: 90 tablet, Rfl: 0 .  amitriptyline (ELAVIL) 10 MG tablet, Take 1 tablet (10 mg total) by mouth at bedtime., Disp: 30 tablet, Rfl: 2 .  Delaughter Cider Vinegar 600 MG CAPS, Take 600 mg by mouth daily., Disp: , Rfl:  .  aspirin EC 81 MG tablet, Take 2 tablets (162 mg total) by mouth daily., Disp: 90 tablet,  Rfl: 3 .  atorvastatin (LIPITOR) 80 MG tablet, Take 1 tablet (80 mg total) by mouth daily at 6 PM., Disp: 90 tablet, Rfl: 2 .  esomeprazole (NEXIUM) 40 MG capsule, Take 1 capsule (40 mg total) by mouth daily before breakfast., Disp: 90 capsule, Rfl: 0 .  gabapentin (NEURONTIN) 100 MG capsule, Take 1 capsule (100 mg total) by mouth at bedtime., Disp: 30 capsule, Rfl: 1 .  Liniments (DEEP BLUE RELIEF) GEL, Apply 1 application topically as needed for pain (joints). In joints, Disp: , Rfl:  .  losartan (COZAAR) 25 MG tablet, Take 1 tablet (25 mg total) by mouth daily., Disp: 90 tablet, Rfl: 0 .  metoprolol tartrate (LOPRESSOR) 25 MG tablet, Take 1 tablet (25 mg total) by mouth 2 (two) times daily., Disp: 180 tablet, Rfl: 0 .  nitroGLYCERIN (NITROSTAT) 0.4 MG SL tablet, Place 1 tablet (0.4 mg total) under the tongue every 5 (five) minutes as needed for chest pain., Disp: 25 tablet, Rfl: 6 .  VITAMIN D, CHOLECALCIFEROL, PO, Take 2,000 mg by mouth daily., Disp: , Rfl:     Assessment & Plan:  Scaitica / neuropathy is better with PT -is on neurontin and amitriptyline spacing then out like suggested.  - Advised NOT to do push ups or anyt aggressive / strenuous back exercises, ok to do streches and mild exercise for back as pain is better with PT. Plays golf twice a week. Is active.  Ok to get PT and exercise at home twice a day.  Does his elliptical and rides his bike.   2. Blurred vision + : spider webs sees [facility].   3. CAD to fu with Cards in Jan  To take 2 ASA per them   4. Hld : recheck FLP, check LFT's work on diet, SE of meds explained to pt. low fat and high fiber diet explained to pt.   Problem List Items Addressed  This Visit   None    No orders of the defined types were placed in this encounter.    No orders of the defined types were placed in this encounter.    Follow up plan: No follow-ups on file.  By

## 2021-01-09 ENCOUNTER — Other Ambulatory Visit: Payer: Self-pay | Admitting: Internal Medicine

## 2021-01-09 ENCOUNTER — Encounter: Payer: Self-pay | Admitting: Physical Therapy

## 2021-01-09 ENCOUNTER — Ambulatory Visit: Payer: Medicare Other | Admitting: Physical Therapy

## 2021-01-09 DIAGNOSIS — M6281 Muscle weakness (generalized): Secondary | ICD-10-CM | POA: Diagnosis not present

## 2021-01-09 DIAGNOSIS — G8929 Other chronic pain: Secondary | ICD-10-CM | POA: Diagnosis not present

## 2021-01-09 DIAGNOSIS — M5441 Lumbago with sciatica, right side: Secondary | ICD-10-CM | POA: Diagnosis not present

## 2021-01-09 NOTE — Therapy (Signed)
[place] [facility] PHYSICAL AND SPORTS MEDICINE [address], [place], [address] Phone: [phone]   Fax:  [phone]  Physical Therapy Treatment  Patient Details  Name: Thomas Miller MRN: [id number] Date of Birth: [birth year] Referring Provider (PT): Loura Pardon, MD   Encounter Date: 01/09/2021   PT End of Session - 01/09/21 0908     Visit Number 9    Number of Visits 24    Date for PT Re-Evaluation 02/11/21    Authorization Type [employer] MEDICARE reporting period from 11/19/2020    Progress Note Due on Visit 10    PT Start Time 0902    PT Stop Time 0942    PT Time Calculation (min) 40 min    Activity Tolerance Patient tolerated treatment well    Behavior During Therapy [facility] for tasks assessed/performed             Past Medical History:  Diagnosis Date   Advanced care planning/counseling discussion 11/27/2016   Aortic atherosclerosis (HCC) 03/24/2020   Noted on CT 03/01/19   BPH (benign prostatic hyperplasia) 10/25/2014   CAD (coronary artery disease) 08/07/2017   GERD (gastroesophageal reflux disease) 10/25/2014   Gout    Hearing loss    History of 2019 novel coronavirus disease (COVID-19) 03/24/2020   On 02/09/20   History of stroke 10/05/2019   Hyperlipemia 10/25/2014   Hypertension    Insomnia 05/19/2016   Kidney stones    PAF (paroxysmal atrial fibrillation) (HCC) 08/07/2017   Reflux 10/25/2014   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   S/P epidural steroid injection 05/2018    Past Surgical History:  Procedure Laterality Date   BACK SURGERY     CORONARY ARTERY BYPASS GRAFT N/A 07/20/2017   Procedure: CORONARY ARTERY BYPASS GRAFTING (CABG) times four using left internal mammary artery to LAD and endoscopically harvested right saphenous vein graft to distal circumflex, intermediate, and PD;  Surgeon: [name], MD;  Location: [facility] OR;  Service: Open Heart Surgery;  Laterality: N/A;   EPIDURAL BLOCK INJECTION  05/2018    L3injections    EYE SURGERY Bilateral 2013   FOOT SURGERY Bilateral    ingrown toenails   GALLBLADDER SURGERY     LEFT HEART CATH AND CORONARY ANGIOGRAPHY N/A 07/17/2017   Procedure: LEFT HEART CATH AND CORONARY ANGIOGRAPHY;  Surgeon: Yvonne Kendall, MD;  Location: [facility] INVASIVE [facility] LAB;  Service: Cardiovascular;  Laterality: N/A;   SHOULDER SURGERY Right    SPINE SURGERY      There were no vitals filed for this visit.   Subjective Assessment - 01/09/21 0904     Subjective Patient reports he is feeling better and has 1/10 slight burning at his right calf. He has been sleeping longer than he has in the last 20 years. He has been using pillows and towels to support his back at night. He walked 10K steps yesterday while playing golf. He also saw his PCP yesterday and everything seemed fine. States the only time he has pain is walking up a steep incline. He also irritated his left wrist he thinks from the wall sags.    Pertinent History Patient is a 70 y.o. male who presents to outpatient physical therapy with a referral for medical diagnosis sciatica of right side. This patient's chief complaints consist of right sided low back, hip, thigh and leg pain/paresthesia leading to the following functional deficits: creates a problem for taking care of everything at the home including his wife who  has dementia (bathing, dressing, meals, etc), sleeping, sitting, traveling, going up stairs, walking up a steep grade.  Relevant past medical history and comorbidities include CABG x4 (2019), flashes in L eye, hx TIA (12/2019), R shoulder surgery, paroxysmal atrial fib (takes metoprolol to slow heart down), gout, GERD, BPH, CAD, kidney stones, HTN, insomnia, hearing loss.  Patient denies hx of cancer, seizures, changes in bowel or bladder problems, new onset stumbling or dropping things, known weak bones/osteoporosis.    Limitations Sitting;Standing;House hold activities;Other (comment)   : creates a problem for  taking care of everything at the home including his wife who has dementia (bathing, dressing, meals, etc), sleeping, sitting, traveling, going up stairs, walking up a steep grade.   How long can you sit comfortably? not more than 30 min    Diagnostic tests Note from Verlin Dike NP at [facility] 11/13/2020:  Reviewd lumbar MRI - overall his MRI looks pretty good. He does have some foraminal stenosis on the right at L3. Reviewed four view lumbar  x-ray-no dynamic instability noted ion flexion-extnesion.   Impression lumbar radiculopathy vs peroneal neuropathy, vs hip pathology?  Ordered  R LE EMG/NCV to be performed.    Patient Stated Goals "to get a little less pain"    Currently in Pain? Yes    Pain Score 1     Pain Onset More than a month ago                TREATMENT:  Per documentation on 11/21/20, pt advised by PCP Dr. Charlotta Newton "NOT to do push ups or any aggressive / strenuous back exercises, ok to do stretches and mild exercise for back" and "Ok to get PT and exercise at home twice a day" Denies bilateral THA   Therapeutic exercise: to centralize symptoms and improve ROM, strength, muscular endurance, and activity tolerance required for successful completion of functional activities.  - Treadmill up to 1.5 mph with 0% grade. For improved lower extremity mobility, muscular endurance, and weightbearing activity tolerance; and to induce the analgesic effect of aerobic exercise, stimulate improved joint nutrition, and prepare body structures and systems for following interventions. x 5  minutes. BUE support.  - standing lumbar extension over TM bar, 1x25 (pain abolished) (Manual therapy - see below) - supine hamstring stretch with strap, 3x30 seconds R side only per patient preference.  (burning a bit afterwards).  - standing wall sags 1x25, (pain improved). - standing lumbar extension over TM bar, 1x25 (pain nearly abolished) - standing single arm row, 3x10 each side at 10#  cable.  - standing hip extension with knee extended. 1x10    Manual therapy: to reduce pain and tissue tension, improve range of motion, neuromodulation, in order to promote improved ability to complete functional activities. PRONE with 3 pillows under chest - CPA grade II-IV with KE wedge at T12-L5, without wedge at sacrum (patient reported decreased and/or increased R leg symptoms, mostly decreased at upper lumbar and when over sacrum).  - STM to right SIJ region and upper glute max  - TTP grade I at upper    Pt required multimodal cuing for proper technique and to facilitate improved neuromuscular control, strength, range of motion, and functional ability resulting in improved performance and form.   HOME EXERCISE PROGRAM Access Code: [email or web address] URL: [email or web address] Date: 01/02/2021 Prepared by: Norton Blizzard   Exercises Seated Correct Posture Standing Lumbar Extension at Wall - Forearms - 1 sets - 20-30 reps - 1 second hold - every 2  hours Hamstring stretch (with strap) - 1 x daily - 3 reps - 30 seconds hold Prone Hip Extension - 1 x daily - 3 sets - 10 reps - 4 seconds hold   CHAIR HIP FLEXOR STRETCH  [[id number]]   Seated Hip Flexor Stretch  -  Repeat 3 Times, Hold 30 Seconds, Perform 1 Times a Day       PT Education - 01/09/21 0908     Education Details Exercise purpose/form. Self management techniques.    Person(s) Educated Patient    Methods Explanation;Demonstration;Tactile cues;Verbal cues    Comprehension Verbalized understanding;Returned demonstration;Verbal cues required;Tactile cues required;Need further instruction              PT Short Term Goals - 11/21/20 1748       PT SHORT TERM GOAL #1   Title Be independent with initial home exercise program for self-management of symptoms.    Baseline Initial HEP provied at IE (11/19/2020);    Time 2    Period Weeks    Status Achieved    Target Date 12/03/20               PT Long Term  Goals - 11/19/20 1426       PT LONG TERM GOAL #1   Title Be independent with a long-term home exercise program for self-management of symptoms.    Baseline Initial HEP provided at IE (11/19/2020);    Time 12    Period Weeks    Status New   TARGET DATE FOR ALL LONG TERM GOALS: 02/11/2021     PT LONG TERM GOAL #2   Title Demonstrate improved FOTO to equal or greater than 65 by visit #12 to demonstrate improvement in overall condition and self-reported functional ability.    Baseline 60 (11/19/2020);    Time 12    Period Weeks    Status New      PT LONG TERM GOAL #3   Title Pateint will demonstrate equal or greater than 4+/5 hip strength in bilateral hip abduciton to improve lumbopelvic stability during funcitonal activities such as climbing stairs.    Baseline R = 4+/5, L = 4/5 (11/19/2020);    Time 12    Period Weeks    Status New      PT LONG TERM GOAL #4   Title Reduce pain with functional activities to equal or less than 1/10 to allow patient to complete usual activities including sleeping, caregiving, sitting, traveling with less difficulty.    Baseline up to 10/10 (11/19/2020);    Time 12    Period Weeks    Status New      PT LONG TERM GOAL #5   Title Complete community, work and/or recreational activities without limitation due to current condition.    Baseline Functional Limitations: creates a problem for taking care of everything at the home including his wife who has dementia (bathing, dressing, meals, etc), sleeping, sitting, traveling, going up stairs, walking up a steep grade (11/19/2020);    Time 12    Period Weeks    Status New      Additional Long Term Goals   Additional Long Term Goals Yes      PT LONG TERM GOAL #6   Title Patient will improve lumbar extension to equal or greater than 50% to improve mobility for functional activity.    Baseline Extension: 25% or less (11/19/2020);    Time 12    Period Weeks    Status New  Plan  - 01/09/21 0935     Clinical Impression Statement Patient arrives reporting significant improvement in symptoms, especially at night. Continued with specific exercises for directional preference that abolished symptoms. Continued to have some brief symptoms with R hamstring/nerve stretch. Patient has not yet returned to prior level of function but is showing good improvement. Did not complete prone hip extension per patient preference due to pain at chest (that he gets when in the prone positiion since open heart surgery several years ago).  Patient would benefit from continued management of limiting condition by skilled physical therapist to address remaining impairments and functional limitations to work towards stated goals and return to PLOF or maximal functional independence.    Personal Factors and Comorbidities Age;Past/Current Experience;Time since onset of injury/illness/exacerbation    Examination-Activity Limitations Stairs;Caring for Others;Sit;Sleep    Examination-Participation Restrictions Cleaning;Laundry;Shop;Community Activity;Meal Prep;Interpersonal Relationship;Other   creates a problem for taking care of everything at the home including his wife who has dementia (bathing, dressing, meals, etc), sleeping, sitting, traveling, going up stairs, walking up a steep grade.   Stability/Clinical Decision Making Stable/Uncomplicated    Rehab Potential Good    PT Frequency 2x / week    PT Duration 12 weeks    PT Treatment/Interventions ADLs/Self Care Home Management;Cryotherapy;Moist Heat;Electrical Stimulation;Gait training;Stair training;Functional mobility training;Neuromuscular re-education;Balance training;Therapeutic exercise;Therapeutic activities;Patient/family education;Manual techniques;Dry needling;Passive range of motion;Joint Manipulations    PT Next Visit Plan update HEP as appropriate, specific exercise, LE/core/functional strengthening, manual therapy as needed    PT Home  Exercise Plan Medbridge Access Code: [email or web address]    Consulted and Agree with Plan of Care Patient             Patient will benefit from skilled therapeutic intervention in order to improve the following deficits and impairments:  Improper body mechanics, Pain, Postural dysfunction, Increased muscle spasms, Decreased mobility, Decreased activity tolerance, Decreased endurance, Decreased range of motion, Decreased strength, Hypomobility, Impaired perceived functional ability, Impaired flexibility  Visit Diagnosis: Chronic right-sided low back pain with right-sided sciatica  Muscle weakness (generalized)     Problem List Patient Active Problem List   Diagnosis Date Noted   Chronic low back pain without sciatica 11/21/2020   Hearing loss    Kidney stones    Aortic atherosclerosis (HCC) 03/24/2020   History of 2019 novel coronavirus disease (COVID-19) 03/24/2020   History of stroke 10/05/2019   S/P epidural steroid injection 05/2018   PAF (paroxysmal atrial fibrillation) (HCC) 08/07/2017   CAD (coronary artery disease) 08/07/2017   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 Ilsa Iha, PT, DPT 01/09/21, 9:45 AM   [facility] PHYSICAL AND SPORTS MEDICINE [address], [place], [address] Phone: [phone]   Fax:  [phone]  Name: Thomas Miller MRN: [id number] Date of Birth: [birth year]

## 2021-01-09 NOTE — Telephone Encounter (Signed)
Requested Prescriptions  Pending Prescriptions Disp Refills   gabapentin (NEURONTIN) 100 MG capsule [Pharmacy Med Name: GABAPENTIN 100 MG CAPSULE] 30 capsule 0    Sig: Take 1 capsule (100 mg total) by mouth at bedtime.     Neurology: Anticonvulsants - gabapentin Passed - 01/09/2021 11:59 AM      Passed - Valid encounter within last 12 months    Recent Outpatient Visits          2 days ago Chronic low back pain without sciatica, unspecified back pain laterality   [facility] Vigg, Avanti, MD   1 month ago Chronic low back pain without sciatica, unspecified back pain laterality   [facility] Vigg, Avanti, MD   2 months ago Sciatica of right side   [facility] Vigg, Avanti, MD   2 months ago Coronary artery disease involving native coronary artery of native heart without angina pectoris   [facility] Vigg, Avanti, MD   5 months ago Primary hypertension   [facility] Vigg, Avanti, MD      Future Appointments            In 1 month Revankar, Aundra Dubin, MD [facility] [place]   In 2 months Vigg, Avanti, MD [facility]   In 10 months  [facility]

## 2021-01-14 ENCOUNTER — Other Ambulatory Visit: Payer: Self-pay | Admitting: Internal Medicine

## 2021-01-14 ENCOUNTER — Ambulatory Visit: Payer: Medicare Other | Admitting: Physical Therapy

## 2021-01-14 DIAGNOSIS — M6281 Muscle weakness (generalized): Secondary | ICD-10-CM | POA: Diagnosis not present

## 2021-01-14 DIAGNOSIS — G8929 Other chronic pain: Secondary | ICD-10-CM

## 2021-01-14 DIAGNOSIS — M5441 Lumbago with sciatica, right side: Secondary | ICD-10-CM | POA: Diagnosis not present

## 2021-01-14 NOTE — Therapy (Signed)
[facility] [address] [place], [address] Phone: [phone]   Fax:  [phone]  Physical Therapy Treatment / Progress Note Dates of reporting from 11/19/2020 to 01/14/2021  Patient Details  Name: Thomas Miller MRN: [id number] Date of Birth: 13-Nov-1950 Referring Provider (PT): Charlynne Cousins, MD   Encounter Date: 01/14/2021   PT End of Session - 01/14/21 0908     Visit Number 10    Number of Visits 24    Date for PT Re-Evaluation 02/11/21    Authorization Type [employer] MEDICARE reporting period from 11/19/2020    Progress Note Due on Visit 10    PT Start Time 0900    PT Stop Time 0940    PT Time Calculation (min) 40 min    Activity Tolerance Patient tolerated treatment well    Behavior During Therapy [facility] for tasks assessed/performed             Past Medical History:  Diagnosis Date   Advanced care planning/counseling discussion 11/27/2016   Aortic atherosclerosis ([place]) 03/24/2020   Noted on CT 03/01/19   BPH (benign prostatic hyperplasia) 10/25/2014   CAD (coronary artery disease) 08/07/2017   GERD (gastroesophageal reflux disease) 10/25/2014   Gout    Hearing loss    History of 2019 novel coronavirus disease (COVID-19) 03/24/2020   On 02/09/20   History of stroke 10/05/2019   Hyperlipemia 10/25/2014   Hypertension    Insomnia 05/19/2016   Kidney stones    PAF (paroxysmal atrial fibrillation) ([place]) 08/07/2017   Reflux 10/25/2014   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   S/P epidural steroid injection 05/2018    Past Surgical History:  Procedure Laterality Date   BACK SURGERY     CORONARY ARTERY BYPASS GRAFT N/A 07/20/2017   Procedure: CORONARY ARTERY BYPASS GRAFTING (CABG) times four using left internal mammary artery to LAD and endoscopically harvested right saphenous vein graft to distal circumflex, intermediate, and PD;  Surgeon: Grace Isaac, MD;  Location: [facility];  Service: Open Heart Surgery;   Laterality: N/A;   EPIDURAL BLOCK INJECTION  05/2018   L3injections    EYE SURGERY Bilateral 2013   FOOT SURGERY Bilateral    ingrown toenails   GALLBLADDER SURGERY     LEFT HEART CATH AND CORONARY ANGIOGRAPHY N/A 07/17/2017   Procedure: LEFT HEART CATH AND CORONARY ANGIOGRAPHY;  Surgeon: Nelva Bush, MD;  Location: [facility];  Service: Cardiovascular;  Laterality: N/A;   SHOULDER SURGERY Right    SPINE SURGERY      There were no vitals filed for this visit.   Subjective Assessment - 01/14/21 0902     Subjective Pateint reports he continues to sleep longer. He was awoken at 5am this morning with 4/10 pain in the right lower leg and hip which is the worst he has had in 2 weeks. He is not awoken every day from the pain but just occasionally. He states he may have fallen asleep on the couch this morning after doing lumbar extension and walking that resolved his pain. He had no pain when he walked into the clinic but immediately got buring in his R lower leg of 2/10 when he sat down in the waiting room for a few min. He canceled golf the for the next 2 weeks due to it being so cold out.  A lot of the things that bothered him when starting PT are better but it still occasionally wakes him  up and going up a steep grade still bothers him but not as much as it did.  He estimates he is about 60% better than when he started PT. He states he has not taken tylenol for at least 2 weeks and he used to take daily or multiple times a day. He thinks he is getting close to being ready to discharge from PT. Patient states he can sit for an hour now before he must move whereas before he was in throbbing pain almost immediately.    Pertinent History Patient is a 70 y.o. male who presents to outpatient physical therapy with a referral for medical diagnosis sciatica of right side. This patient's chief complaints consist of right sided low back, hip, thigh and leg pain/paresthesia leading to the following  functional deficits: creates a problem for taking care of everything at the home including his wife who has dementia (bathing, dressing, meals, etc), sleeping, sitting, traveling, going up stairs, walking up a steep grade.  Relevant past medical history and comorbidities include CABG x4 (2019), flashes in L eye, hx TIA (12/2019), R shoulder surgery, paroxysmal atrial fib (takes metoprolol to slow heart down), gout, GERD, BPH, CAD, kidney stones, HTN, insomnia, hearing loss.  Patient denies hx of cancer, seizures, changes in bowel or bladder problems, new onset stumbling or dropping things, known weak bones/osteoporosis.    Limitations Sitting;Standing;House hold activities;Other (comment)   : creates a problem for taking care of everything at the home including his wife who has dementia (bathing, dressing, meals, etc), sleeping, sitting, traveling, going up stairs, walking up a steep grade.   How long can you sit comfortably? not more than 30 min    Diagnostic tests Note from Glenford Peers NP at [facility] 11/13/2020:  Reviewd lumbar MRI - overall his MRI looks pretty good. He does have some foraminal stenosis on the right at L3. Reviewed four view lumbar  x-ray-no dynamic instability noted ion flexion-extnesion.   Impression lumbar radiculopathy vs peroneal neuropathy, vs hip pathology?  Ordered  R LE EMG/NCV to be performed.    Patient Stated Goals "to get a little less pain"    Currently in Pain? Yes    Pain Score 2     Pain Onset More than a month ago            OBJECTIVE  SELF-REPORTED FUNCTION FOTO score: 70/100 (lumbar spine questionnaire)  SPINE MOTION Lumbar Spine AROM *Indicates pain Flexion: fingers to mid shin, increased burning in right leg  Extension: 50%  no pain  Side Flexion:        R 75%       L 75% Rotation:  R 50% L 50%  MUSCLE PERFORMANCE (MMT):  *Indicates pain 11/19/20 01/14/21 Date  Joint/Motion R/L R/L R/L  Hip        Abduction 4+/4 5/5 /   Knee        Extension (L3) 5/5 / /  Flexion (S2) 5/5 / /  Ankle/Foot        Dorsiflexion (L4) 5/5 / /  Great toe extension (L5) 4+/4 / /  Eversion (S1) 4+/4+ / /  Plantarflexion (S1) 4+/ / /  can heel and toe walk bilaterally without UE support.     TREATMENT:  Per documentation on 11/21/20, pt advised by PCP Dr. Neomia Dear "NOT to do push ups or any aggressive / strenuous back exercises, ok to do stretches and mild exercise for back" and "Ok to get PT and exercise at home  twice a day" Denies bilateral THA   Therapeutic exercise: to centralize symptoms and improve ROM, strength, muscular endurance, and activity tolerance required for successful completion of functional activities.  - Treadmill up to 1.5 mph with 0% grade. For improved lower extremity mobility, muscular endurance, and weightbearing activity tolerance; and to induce the analgesic effect of aerobic exercise, stimulate improved joint nutrition, and prepare body structures and systems for following interventions. x 6:45  minutes. BUE support.  - standing lumbar extension over TM bar, 1x44, 1x20 (pain abolished) - ambulation around clinic ~ 100 feet  - measurements to assess progress - see above - flexion produced symptoms.  - standing lumbar extension over TM bar, 1x20 (pain abolished) (Manual therapy - see below) - Prone hip extension with knee extended. 3x10 Cuing to keep knee flexed. Pt required time to learn and for transitions between sides.  - supine hamstring stretch with strap, 3x30 seconds R side only per patient preference.  (burning a bit afterwards).   - standing wall sags 1x25, (pain improved).  - standing lumbar extension over TM bar, 1x25 (pain nearly abolished)    Manual therapy: to reduce pain and tissue tension, improve range of motion, neuromodulation, in order to promote improved ability to complete functional activities. PRONE with 3 pillows under chest - CPA grade II-IV with KE wedge at T12-L5, without  wedge at sacrum (patient reported decreased and/or increased R leg symptoms, mostly decreased at upper lumbar and when over sacrum).  - R UPA grade III-IV at lower lumbar segments (hypomobile).  - STM to right SIJ region and upper glute max  - TTP grade I at upper    Pt required multimodal cuing for proper technique and to facilitate improved neuromuscular control, strength, range of motion, and functional ability resulting in improved performance and form.   HOME EXERCISE PROGRAM Access Code: [email or web address] URL: [email or web address] Date: 01/02/2021 Prepared by: Rosita Kea   Exercises Seated Correct Posture Standing Lumbar Extension at Wall - Forearms - 1 sets - 20-30 reps - 1 second hold - every 2 hours Hamstring stretch (with strap) - 1 x daily - 3 reps - 30 seconds hold Prone Hip Extension - 1 x daily - 3 sets - 10 reps - 4 seconds hold   CHAIR HIP FLEXOR STRETCH  [[id number]]   Seated Hip Flexor Stretch  -  Repeat 3 Times, Hold 30 Seconds, Perform 1 Times a Day     PT Education - 01/14/21 0907     Education Details Exercise purpose/form. Self management techniques. progress, POC    Person(s) Educated Patient    Methods Explanation;Demonstration;Tactile cues;Verbal cues    Comprehension Verbalized understanding;Returned demonstration;Verbal cues required;Tactile cues required;Need further instruction              PT Short Term Goals - 11/21/20 1748       PT SHORT TERM GOAL #1   Title Be independent with initial home exercise program for self-management of symptoms.    Baseline Initial HEP provied at IE (11/19/2020);    Time 2    Period Weeks    Status Achieved    Target Date 12/03/20               PT Long Term Goals - 01/14/21 0910       PT LONG TERM GOAL #1   Title Be independent with a long-term home exercise program for self-management of symptoms.    Baseline Initial HEP provided at IE (11/19/2020); patient is participating well in  appropriate  long term HEP that helps control his symptoms (01/14/2021);    Time 12    Period Weeks    Status Partially Met   TARGET DATE FOR ALL LONG TERM GOALS: 02/11/2021     PT LONG TERM GOAL #2   Title Demonstrate improved FOTO to equal or greater than 65 by visit #12 to demonstrate improvement in overall condition and self-reported functional ability.    Baseline 60 (11/19/2020);    Time 12    Period Weeks    Status New      PT LONG TERM GOAL #3   Title Pateint will demonstrate equal or greater than 4+/5 hip strength in bilateral hip abduciton to improve lumbopelvic stability during funcitonal activities such as climbing stairs.    Baseline R = 4+/5, L = 4/5 (11/19/2020); B = 5/5 (01/14/2021);    Time 12    Period Weeks    Status Achieved      PT LONG TERM GOAL #4   Title Reduce pain with functional activities to equal or less than 1/10 to allow patient to complete usual activities including sleeping, caregiving, sitting, traveling with less difficulty.    Baseline up to 10/10 (11/19/2020); 4/10 in the last 2 weeks (01/14/2021);    Time 12    Period Weeks    Status Partially Met      PT LONG TERM GOAL #5   Title Complete community, work and/or recreational activities without limitation due to current condition.    Baseline Functional Limitations: creates a problem for taking care of everything at the home including his wife who has dementia (bathing, dressing, meals, etc), sleeping, sitting, traveling, going up stairs, walking up a steep grade (11/19/2020); improved except he is awoken at times and going up an incline is still difficult (01/14/2021);    Time 12    Period Weeks    Status Partially Met      PT LONG TERM GOAL #6   Title Patient will improve lumbar extension to equal or greater than 50% to improve mobility for functional activity.    Baseline Extension: 25% or less (11/19/2020); 50% (01/14/2021);    Time 12    Period Weeks    Status Achieved                    Plan - 01/14/21 0936     Clinical Impression Statement Patient has attended 10 physical therapy sessions since starting this episode of care on 11/19/2020 and has made good progress towards goals. He estimates about 60% improvement and no longer takes tylenol to whereas before he took it 1-2 times most days. He has improved function but still has difficulty waking from sleep in the mornings at times and has pain with going up inclines or sitting activities. Patient has improved his hip abduction strength to 5/5 each side and has been successful managing symptoms with HEP focusing on directional preference. He does report limited maintenance of symptom relief after directional preference. Patient reports he feels he is nearing readiness to discharge but is not quite comfortable with that today. He continues to benefit from manual therapy. Patient would benefit from continued management of limiting condition by skilled physical therapist to address remaining impairments and functional limitations to work towards stated goals and return to PLOF or maximal functional independence.    Personal Factors and Comorbidities Age;Past/Current Experience;Time since onset of injury/illness/exacerbation    Examination-Activity Limitations Stairs;Caring for Others;Sit;Sleep    Examination-Participation Restrictions Cleaning;Laundry;Shop;Community Activity;Meal  Prep;Interpersonal Relationship;Other   creates a problem for taking care of everything at the home including his wife who has dementia (bathing, dressing, meals, etc), sleeping, sitting, traveling, going up stairs, walking up a steep grade.   Stability/Clinical Decision Making Stable/Uncomplicated    Rehab Potential Good    PT Frequency 2x / week    PT Duration 12 weeks    PT Treatment/Interventions ADLs/Self Care Home Management;Cryotherapy;Moist Heat;Electrical Stimulation;Gait training;Stair training;Functional mobility training;Neuromuscular  re-education;Balance training;Therapeutic exercise;Therapeutic activities;Patient/family education;Manual techniques;Dry needling;Passive range of motion;Joint Manipulations    PT Next Visit Plan update HEP as appropriate, specific exercise, LE/core/functional strengthening, manual therapy as needed    PT Home Exercise Plan Medbridge Access Code: [email or web address]    Consulted and Agree with Plan of Care Patient             Patient will benefit from skilled therapeutic intervention in order to improve the following deficits and impairments:  Improper body mechanics, Pain, Postural dysfunction, Increased muscle spasms, Decreased mobility, Decreased activity tolerance, Decreased endurance, Decreased range of motion, Decreased strength, Hypomobility, Impaired perceived functional ability, Impaired flexibility  Visit Diagnosis: Chronic right-sided low back pain with right-sided sciatica  Muscle weakness (generalized)     Problem List Patient Active Problem List   Diagnosis Date Noted   Chronic low back pain without sciatica 11/21/2020   Hearing loss    Kidney stones    Aortic atherosclerosis ([place]) 03/24/2020   History of 2019 novel coronavirus disease (COVID-19) 03/24/2020   History of stroke 10/05/2019   S/P epidural steroid injection 05/2018   PAF (paroxysmal atrial fibrillation) ([place]) 08/07/2017   CAD (coronary artery disease) 08/07/2017   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 Graylon Good, PT, DPT 01/14/21, 9:50 AM   [facility] [address], [place], [address] Phone: [phone]   Fax:  [phone]  Name: Thomas Miller MRN: [id number] Date of Birth: 11/08/1950

## 2021-01-14 NOTE — Telephone Encounter (Signed)
Requested Prescriptions  Pending Prescriptions Disp Refills   amitriptyline (ELAVIL) 10 MG tablet [Pharmacy Med Name: AMITRIPTYLINE HCL 10 MG TAB] 30 tablet 0    Sig: Take 1 tablet (10 mg total) by mouth at bedtime.     Psychiatry:  Antidepressants - Heterocyclics (TCAs) Passed - 01/14/2021 12:02 PM      Passed - Valid encounter within last 6 months    Recent Outpatient Visits          1 week ago Chronic low back pain without sciatica, unspecified back pain laterality   [facility] Vigg, Avanti, MD   1 month ago Chronic low back pain without sciatica, unspecified back pain laterality   [facility] Vigg, Avanti, MD   2 months ago Sciatica of right side   [facility] Vigg, Avanti, MD   2 months ago Coronary artery disease involving native coronary artery of native heart without angina pectoris   [facility] Vigg, Avanti, MD   5 months ago Primary hypertension   [facility] Vigg, Avanti, MD      Future Appointments            In 1 month Revankar, Aundra Dubin, MD [facility] [place]   In 2 months Vigg, Avanti, MD [facility]   In 9 months  [facility], [facility]

## 2021-01-16 ENCOUNTER — Encounter: Payer: Self-pay | Admitting: Physical Therapy

## 2021-01-16 ENCOUNTER — Ambulatory Visit: Payer: Medicare Other | Admitting: Physical Therapy

## 2021-01-16 DIAGNOSIS — M6281 Muscle weakness (generalized): Secondary | ICD-10-CM

## 2021-01-16 DIAGNOSIS — G8929 Other chronic pain: Secondary | ICD-10-CM

## 2021-01-16 DIAGNOSIS — M5441 Lumbago with sciatica, right side: Secondary | ICD-10-CM | POA: Diagnosis not present

## 2021-01-16 NOTE — Therapy (Signed)
[facility] [address] [place], [address] Phone: [phone]   Fax:  [phone]  Physical Therapy Treatment  Patient Details  Name: Thomas Miller MRN: [id number] Date of Birth: April 24, 1950 Referring Provider (PT): Charlynne Cousins, MD   Encounter Date: 01/16/2021   PT End of Session - 01/16/21 0903     Visit Number 11    Number of Visits 24    Date for PT Re-Evaluation 02/11/21    Authorization Type [employer] MEDICARE reporting period from 01/14/2021    Progress Note Due on Visit 10    PT Start Time 0900    PT Stop Time 0940    PT Time Calculation (min) 40 min    Activity Tolerance Patient tolerated treatment well    Behavior During Therapy [facility] for tasks assessed/performed             Past Medical History:  Diagnosis Date   Advanced care planning/counseling discussion 11/27/2016   Aortic atherosclerosis ([place]) 03/24/2020   Noted on CT 03/01/19   BPH (benign prostatic hyperplasia) 10/25/2014   CAD (coronary artery disease) 08/07/2017   GERD (gastroesophageal reflux disease) 10/25/2014   Gout    Hearing loss    History of 2019 novel coronavirus disease (COVID-19) 03/24/2020   On 02/09/20   History of stroke 10/05/2019   Hyperlipemia 10/25/2014   Hypertension    Insomnia 05/19/2016   Kidney stones    PAF (paroxysmal atrial fibrillation) ([place]) 08/07/2017   Reflux 10/25/2014   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   S/P epidural steroid injection 05/2018    Past Surgical History:  Procedure Laterality Date   BACK SURGERY     CORONARY ARTERY BYPASS GRAFT N/A 07/20/2017   Procedure: CORONARY ARTERY BYPASS GRAFTING (CABG) times four using left internal mammary artery to LAD and endoscopically harvested right saphenous vein graft to distal circumflex, intermediate, and PD;  Surgeon: Grace Isaac, MD;  Location: [facility];  Service: Open Heart Surgery;  Laterality: N/A;   EPIDURAL BLOCK INJECTION  05/2018    L3injections    EYE SURGERY Bilateral 2013   FOOT SURGERY Bilateral    ingrown toenails   GALLBLADDER SURGERY     LEFT HEART CATH AND CORONARY ANGIOGRAPHY N/A 07/17/2017   Procedure: LEFT HEART CATH AND CORONARY ANGIOGRAPHY;  Surgeon: Nelva Bush, MD;  Location: [facility];  Service: Cardiovascular;  Laterality: N/A;   SHOULDER SURGERY Right    SPINE SURGERY      There were no vitals filed for this visit.   Subjective Assessment - 01/16/21 0859     Subjective Pateint reports he has 1/10 pain in the right calf burning after riding around this morning taking his wife to daycare and coming to PT. States he slept until 6am this morning and felt great after last PT session.    Pertinent History Patient is a 70 y.o. male who presents to outpatient physical therapy with a referral for medical diagnosis sciatica of right side. This patient's chief complaints consist of right sided low back, hip, thigh and leg pain/paresthesia leading to the following functional deficits: creates a problem for taking care of everything at the home including his wife who has dementia (bathing, dressing, meals, etc), sleeping, sitting, traveling, going up stairs, walking up a steep grade.  Relevant past medical history and comorbidities include CABG x4 (2019), flashes in L eye, hx TIA (12/2019), R shoulder surgery, paroxysmal atrial fib (takes metoprolol to slow  heart down), gout, GERD, BPH, CAD, kidney stones, HTN, insomnia, hearing loss.  Patient denies hx of cancer, seizures, changes in bowel or bladder problems, new onset stumbling or dropping things, known weak bones/osteoporosis.    Limitations Sitting;Standing;House hold activities;Other (comment)   : creates a problem for taking care of everything at the home including his wife who has dementia (bathing, dressing, meals, etc), sleeping, sitting, traveling, going up stairs, walking up a steep grade.   How long can you sit comfortably? not more than 30 min     Diagnostic tests Note from Glenford Peers NP at [facility] 11/13/2020:  Reviewd lumbar MRI - overall his MRI looks pretty good. He does have some foraminal stenosis on the right at L3. Reviewed four view lumbar  x-ray-no dynamic instability noted ion flexion-extnesion.   Impression lumbar radiculopathy vs peroneal neuropathy, vs hip pathology?  Ordered  R LE EMG/NCV to be performed.    Patient Stated Goals "to get a little less pain"    Currently in Pain? Yes    Pain Score 1                TREATMENT:  Per documentation on 11/21/20, pt advised by PCP Dr. Neomia Dear "NOT to do push ups or any aggressive / strenuous back exercises, ok to do stretches and mild exercise for back" and "Ok to get PT and exercise at home twice a day" Denies bilateral THA   Therapeutic exercise: to centralize symptoms and improve ROM, strength, muscular endurance, and activity tolerance required for successful completion of functional activities.  - Treadmill up to 1.5 mph with 0% grade. For improved lower extremity mobility, muscular endurance, and weightbearing activity tolerance; and to induce the analgesic effect of aerobic exercise, stimulate improved joint nutrition, and prepare body structures and systems for following interventions. x 5:27 minutes. BUE support.  - standing lumbar extension over TM bar, 1x50 (pain abolished) (Manual therapy - see below) - Prone hip extension with knee extended. 3x10 each side (alternating sides).  - supine hamstring stretch with strap, 3x30 seconds either side only per patient preference.  (no pain afterwards).  - standing single arm row, 3x10 each side at 10# cable.  - runner's step to 6 inch step with intermittent U UE support, 3x10 each side. (Mild burning started after)   - standing lumbar extension over TM bar, 1x25 (pain started to increase 1/10 at right calf)  Manual therapy: to reduce pain and tissue tension, improve range of motion, neuromodulation, in  order to promote improved ability to complete functional activities. PRONE with 3 pillows under chest - CPA grade II-IV with KE wedge at T12-L5, without wedge at sacrum (patient reported decreased and/or increased R leg symptoms, mostly decreased at upper lumbar and when over sacrum).  - R UPA grade III-IV at lower lumbar segments (hypomobile).  - STM to right SIJ region and upper glute max  - not TTP today   Pt required multimodal cuing for proper technique and to facilitate improved neuromuscular control, strength, range of motion, and functional ability resulting in improved performance and form.   HOME EXERCISE PROGRAM Access Code: [email or web address] URL: [email or web address] Date: 01/02/2021 Prepared by: Rosita Kea   Exercises Seated Correct Posture Standing Lumbar Extension at Wall - Forearms - 1 sets - 20-30 reps - 1 second hold - every 2 hours Hamstring stretch (with strap) - 1 x daily - 3 reps - 30 seconds hold Prone Hip Extension - 1 x daily - 3 sets - 10  reps - 4 seconds hold   CHAIR HIP FLEXOR STRETCH  [[id number]]   Seated Hip Flexor Stretch  -  Repeat 3 Times, Hold 30 Seconds, Perform 1 Times a Day     PT Education - 01/16/21 0903     Education Details Exercise purpose/form. Self management techniques.    Person(s) Educated Patient    Methods Explanation;Demonstration;Tactile cues;Verbal cues    Comprehension Verbalized understanding;Returned demonstration;Verbal cues required;Tactile cues required;Need further instruction              PT Short Term Goals - 11/21/20 1748       PT SHORT TERM GOAL #1   Title Be independent with initial home exercise program for self-management of symptoms.    Baseline Initial HEP provied at IE (11/19/2020);    Time 2    Period Weeks    Status Achieved    Target Date 12/03/20               PT Long Term Goals - 01/14/21 0910       PT LONG TERM GOAL #1   Title Be independent with a long-term home exercise  program for self-management of symptoms.    Baseline Initial HEP provided at IE (11/19/2020); patient is participating well in appropriate long term HEP that helps control his symptoms (01/14/2021);    Time 12    Period Weeks    Status Partially Met   TARGET DATE FOR ALL LONG TERM GOALS: 02/11/2021     PT LONG TERM GOAL #2   Title Demonstrate improved FOTO to equal or greater than 65 by visit #12 to demonstrate improvement in overall condition and self-reported functional ability.    Baseline 60 (11/19/2020);    Time 12    Period Weeks    Status New      PT LONG TERM GOAL #3   Title Pateint will demonstrate equal or greater than 4+/5 hip strength in bilateral hip abduciton to improve lumbopelvic stability during funcitonal activities such as climbing stairs.    Baseline R = 4+/5, L = 4/5 (11/19/2020); B = 5/5 (01/14/2021);    Time 12    Period Weeks    Status Achieved      PT LONG TERM GOAL #4   Title Reduce pain with functional activities to equal or less than 1/10 to allow patient to complete usual activities including sleeping, caregiving, sitting, traveling with less difficulty.    Baseline up to 10/10 (11/19/2020); 4/10 in the last 2 weeks (01/14/2021);    Time 12    Period Weeks    Status Partially Met      PT LONG TERM GOAL #5   Title Complete community, work and/or recreational activities without limitation due to current condition.    Baseline Functional Limitations: creates a problem for taking care of everything at the home including his wife who has dementia (bathing, dressing, meals, etc), sleeping, sitting, traveling, going up stairs, walking up a steep grade (11/19/2020); improved except he is awoken at times and going up an incline is still difficult (01/14/2021);    Time 12    Period Weeks    Status Partially Met      PT LONG TERM GOAL #6   Title Patient will improve lumbar extension to equal or greater than 50% to improve mobility for functional activity.     Baseline Extension: 25% or less (11/19/2020); 50% (01/14/2021);    Time 12    Period Weeks    Status Achieved  Plan - 01/16/21 0932     Clinical Impression Statement Patient tolerated treatment well this session and continues to abolish pain with lumbar extension exercises. Continued with core and LE strengthening mixed with specific exercise and manual therapy for extension. Patient would benefit from continued management of limiting condition by skilled physical therapist to address remaining impairments and functional limitations to work towards stated goals and return to PLOF or maximal functional independence.    Personal Factors and Comorbidities Age;Past/Current Experience;Time since onset of injury/illness/exacerbation    Examination-Activity Limitations Stairs;Caring for Others;Sit;Sleep    Examination-Participation Restrictions Cleaning;Laundry;Shop;Community Activity;Meal Prep;Interpersonal Relationship;Other   creates a problem for taking care of everything at the home including his wife who has dementia (bathing, dressing, meals, etc), sleeping, sitting, traveling, going up stairs, walking up a steep grade.   Stability/Clinical Decision Making Stable/Uncomplicated    Rehab Potential Good    PT Frequency 2x / week    PT Duration 12 weeks    PT Treatment/Interventions ADLs/Self Care Home Management;Cryotherapy;Moist Heat;Electrical Stimulation;Gait training;Stair training;Functional mobility training;Neuromuscular re-education;Balance training;Therapeutic exercise;Therapeutic activities;Patient/family education;Manual techniques;Dry needling;Passive range of motion;Joint Manipulations    PT Next Visit Plan update HEP as appropriate, specific exercise, LE/core/functional strengthening, manual therapy as needed    PT Home Exercise Plan Medbridge Access Code: [email or web address]    Consulted and Agree with Plan of Care Patient             Patient will benefit from  skilled therapeutic intervention in order to improve the following deficits and impairments:  Improper body mechanics, Pain, Postural dysfunction, Increased muscle spasms, Decreased mobility, Decreased activity tolerance, Decreased endurance, Decreased range of motion, Decreased strength, Hypomobility, Impaired perceived functional ability, Impaired flexibility  Visit Diagnosis: Chronic right-sided low back pain with right-sided sciatica  Muscle weakness (generalized)     Problem List Patient Active Problem List   Diagnosis Date Noted   Chronic low back pain without sciatica 11/21/2020   Hearing loss    Kidney stones    Aortic atherosclerosis ([place]) 03/24/2020   History of 2019 novel coronavirus disease (COVID-19) 03/24/2020   History of stroke 10/05/2019   S/P epidural steroid injection 05/2018   PAF (paroxysmal atrial fibrillation) ([place]) 08/07/2017   CAD (coronary artery disease) 08/07/2017   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 Graylon Good, PT, DPT 01/16/21, 9:42 AM   [facility] [address] [place], [address] Phone: [phone]   Fax:  [phone]  Name: DUVID SMALLS MRN: [id number] Date of Birth: Nov 24, 1950

## 2021-01-22 ENCOUNTER — Other Ambulatory Visit: Payer: Self-pay | Admitting: Internal Medicine

## 2021-01-22 DIAGNOSIS — K219 Gastro-esophageal reflux disease without esophagitis: Secondary | ICD-10-CM

## 2021-01-23 ENCOUNTER — Ambulatory Visit: Payer: Medicare Other | Admitting: Physical Therapy

## 2021-01-23 ENCOUNTER — Encounter: Payer: Self-pay | Admitting: Physical Therapy

## 2021-01-23 DIAGNOSIS — M6281 Muscle weakness (generalized): Secondary | ICD-10-CM | POA: Diagnosis not present

## 2021-01-23 DIAGNOSIS — G8929 Other chronic pain: Secondary | ICD-10-CM | POA: Diagnosis not present

## 2021-01-23 DIAGNOSIS — M5441 Lumbago with sciatica, right side: Secondary | ICD-10-CM

## 2021-01-23 NOTE — Telephone Encounter (Signed)
Requested Prescriptions  Pending Prescriptions Disp Refills   esomeprazole (NEXIUM) 40 MG capsule [Pharmacy Med Name: ESOMEPRAZOLE MAG DR 40 MG CAP] 90 capsule 1    Sig: Take 1 capsule (40 mg total) by mouth daily before breakfast.     Gastroenterology: Proton Pump Inhibitors Passed - 01/22/2021 11:40 AM      Passed - Valid encounter within last 12 months    Recent Outpatient Visits          2 weeks ago Chronic low back pain without sciatica, unspecified back pain laterality   [facility] Vigg, Avanti, MD   2 months ago Chronic low back pain without sciatica, unspecified back pain laterality   [facility] Vigg, Avanti, MD   2 months ago Sciatica of right side   [facility] Vigg, Avanti, MD   3 months ago Coronary artery disease involving native coronary artery of native heart without angina pectoris   [facility] Vigg, Avanti, MD   5 months ago Primary hypertension   [facility] Vigg, Avanti, MD      Future Appointments            In 4 weeks Revankar, Aundra Dubin, MD [facility]   In 2 months Vigg, Avanti, MD [facility]   In 9 months  [facility], [facility]

## 2021-01-23 NOTE — Therapy (Signed)
[facility] [address] [place], [address] Phone: [phone]   Fax:  [phone]  Physical Therapy Treatment / Discharge Summary Dates of reporting from 11/19/2020 to 01/23/2021  Patient Details  Name: Thomas Miller MRN: [id number] Date of Birth: [birth year] Referring Provider (PT): Charlynne Cousins, MD   Encounter Date: 01/23/2021   PT End of Session - 01/23/21 0911     Visit Number 12    Number of Visits 24    Date for PT Re-Evaluation 02/11/21    Authorization Type [employer] MEDICARE reporting period from 01/14/2021    Progress Note Due on Visit 10    PT Start Time 0900    PT Stop Time 0940    PT Time Calculation (min) 40 min    Activity Tolerance Patient tolerated treatment well    Behavior During Therapy [facility] for tasks assessed/performed             Past Medical History:  Diagnosis Date   Advanced care planning/counseling discussion 11/27/2016   Aortic atherosclerosis ([place]) 03/24/2020   Noted on CT 03/01/19   BPH (benign prostatic hyperplasia) 10/25/2014   CAD (coronary artery disease) 08/07/2017   GERD (gastroesophageal reflux disease) 10/25/2014   Gout    Hearing loss    History of 2019 novel coronavirus disease (COVID-19) 03/24/2020   On 02/09/20   History of stroke 10/05/2019   Hyperlipemia 10/25/2014   Hypertension    Insomnia 05/19/2016   Kidney stones    PAF (paroxysmal atrial fibrillation) ([place]) 08/07/2017   Reflux 10/25/2014   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   S/P epidural steroid injection 05/2018    Past Surgical History:  Procedure Laterality Date   BACK SURGERY     CORONARY ARTERY BYPASS GRAFT N/A 07/20/2017   Procedure: CORONARY ARTERY BYPASS GRAFTING (CABG) times four using left internal mammary artery to LAD and endoscopically harvested right saphenous vein graft to distal circumflex, intermediate, and PD;  Surgeon: Grace Isaac, MD;  Location: [facility];  Service: Open Heart  Surgery;  Laterality: N/A;   EPIDURAL BLOCK INJECTION  05/2018   L3injections    EYE SURGERY Bilateral 2013   FOOT SURGERY Bilateral    ingrown toenails   GALLBLADDER SURGERY     LEFT HEART CATH AND CORONARY ANGIOGRAPHY N/A 07/17/2017   Procedure: LEFT HEART CATH AND CORONARY ANGIOGRAPHY;  Surgeon: Nelva Bush, MD;  Location: [facility];  Service: Cardiovascular;  Laterality: N/A;   SHOULDER SURGERY Right    SPINE SURGERY      There were no vitals filed for this visit.   Subjective Assessment - 01/23/21 0902     Subjective Patient reports he did not get as much activity in this morning due to household dynamics but was able to sleep until about 6am. His wife woke him up and he thinks he may have slept longer if she hadn't woken him up. He states he currently has pain in the right calf of 2/10. He states he feels ready for discharge to independent management. He states he feels like he made a lot of progress.    Pertinent History Patient is a 70 y.o. male who presents to outpatient physical therapy with a referral for medical diagnosis sciatica of right side. This patient's chief complaints consist of right sided low back, hip, thigh and leg pain/paresthesia leading to the following functional deficits: creates a problem for taking care of everything at the home including his  wife who has dementia (bathing, dressing, meals, etc), sleeping, sitting, traveling, going up stairs, walking up a steep grade.  Relevant past medical history and comorbidities include CABG x4 (2019), flashes in L eye, hx TIA (12/2019), R shoulder surgery, paroxysmal atrial fib (takes metoprolol to slow heart down), gout, GERD, BPH, CAD, kidney stones, HTN, insomnia, hearing loss.  Patient denies hx of cancer, seizures, changes in bowel or bladder problems, new onset stumbling or dropping things, known weak bones/osteoporosis.    Limitations Sitting;Standing;House hold activities;Other (comment)   : creates a  problem for taking care of everything at the home including his wife who has dementia (bathing, dressing, meals, etc), sleeping, sitting, traveling, going up stairs, walking up a steep grade.   How long can you sit comfortably? not more than 30 min    Diagnostic tests Note from Glenford Peers NP at [facility] 11/13/2020:  Reviewd lumbar MRI - overall his MRI looks pretty good. He does have some foraminal stenosis on the right at L3. Reviewed four view lumbar  x-ray-no dynamic instability noted ion flexion-extnesion.   Impression lumbar radiculopathy vs peroneal neuropathy, vs hip pathology?  Ordered  R LE EMG/NCV to be performed.    Patient Stated Goals "to get a little less pain"    Currently in Pain? Yes    Pain Score 2             OBJECTIVE  SELF-REPORTED FUNCTION FOTO score: 72/100 (lumbar spine questionnaire)   TREATMENT:  Per documentation on 11/21/20, pt advised by PCP Dr. Neomia Dear "NOT to do push ups or any aggressive / strenuous back exercises, ok to do stretches and mild exercise for back" and "Ok to get PT and exercise at home twice a day" Denies bilateral THA   Therapeutic exercise: to centralize symptoms and improve ROM, strength, muscular endurance, and activity tolerance required for successful completion of functional activities.  - Treadmill up to 1.6 mph with 0% grade. For improved lower extremity mobility, muscular endurance, and weightbearing activity tolerance; and to induce the analgesic effect of aerobic exercise, stimulate improved joint nutrition, and prepare body structures and systems for following interventions. x 6 minutes. BUE support.  - standing lumbar extension over TM bar, 1x50 (pain decreased to 1/10 in calf) - standing wall sags 1x40 (pain centralized to R glute).  (Manual therapy - see below) - Prone hip extension with knee extended. 3x10 each side (alternating sides).  - supine hamstring stretch with strap, 3x30 seconds either side only per  patient preference.  (no pain afterwards).   Manual therapy: to reduce pain and tissue tension, improve range of motion, neuromodulation, in order to promote improved ability to complete functional activities. PRONE with 3 pillows under chest - CPA grade II-IV with KE wedge at T12-L5, without wedge at sacrum (patient reported decreased and/or increased R leg symptoms, mostly decreased at upper lumbar and when over sacrum).  - R UPA grade III-IV at lower lumbar segments (hypomobile).  - STM to right SIJ region and upper glute max  - TTP grade 1   Pt required multimodal cuing for proper technique and to facilitate improved neuromuscular control, strength, range of motion, and functional ability resulting in improved performance and form.   HOME EXERCISE PROGRAM Access Code: [email or web address] URL: [email or web address] Date: 01/02/2021 Prepared by: Rosita Kea   Exercises Seated Correct Posture Standing Lumbar Extension at Wall - Forearms - 1 sets - 20-30 reps - 1 second hold - every 2 hours Hamstring stretch (with strap) -  1 x daily - 3 reps - 30 seconds hold Prone Hip Extension - 1 x daily - 3 sets - 10 reps - 4 seconds hold   CHAIR HIP FLEXOR STRETCH  [[id number]]   Seated Hip Flexor Stretch  -  Repeat 3 Times, Hold 30 Seconds, Perform 1 Times a Day     PT Education - 01/23/21 0910     Education Details Exercise purpose/form. Self management techniques. Discharge reccomendations    Person(s) Educated Patient    Methods Explanation    Comprehension Verbalized understanding              PT Short Term Goals - 11/21/20 1748       PT SHORT TERM GOAL #1   Title Be independent with initial home exercise program for self-management of symptoms.    Baseline Initial HEP provied at IE (11/19/2020);    Time 2    Period Weeks    Status Achieved    Target Date 12/03/20               PT Long Term Goals - 01/23/21 0915       PT LONG TERM GOAL #1   Title Be independent  with a long-term home exercise program for self-management of symptoms.    Baseline Initial HEP provided at IE (11/19/2020); patient is participating well in appropriate long term HEP that helps control his symptoms (01/14/2021);    Time 12    Period Weeks    Status Achieved   TARGET DATE FOR ALL LONG TERM GOALS: 02/11/2021     PT LONG TERM GOAL #2   Title Demonstrate improved FOTO to equal or greater than 65 by visit #12 to demonstrate improvement in overall condition and self-reported functional ability.    Baseline 60 (11/19/2020); 70 at visit 10# (01/14/2021);    Time 12    Period Weeks    Status Achieved      PT LONG TERM GOAL #3   Title Pateint will demonstrate equal or greater than 4+/5 hip strength in bilateral hip abduciton to improve lumbopelvic stability during funcitonal activities such as climbing stairs.    Baseline R = 4+/5, L = 4/5 (11/19/2020); B = 5/5 (01/14/2021);    Time 12    Period Weeks    Status Achieved      PT LONG TERM GOAL #4   Title Reduce pain with functional activities to equal or less than 1/10 to allow patient to complete usual activities including sleeping, caregiving, sitting, traveling with less difficulty.    Baseline up to 10/10 (11/19/2020); 4/10 in the last 2 weeks (01/14/2021); 4/10 in the last 2 weeks (01/23/2021);    Time 12    Period Weeks    Status Partially Met      PT LONG TERM GOAL #5   Title Complete community, work and/or recreational activities without limitation due to current condition.    Baseline Functional Limitations: creates a problem for taking care of everything at the home including his wife who has dementia (bathing, dressing, meals, etc), sleeping, sitting, traveling, going up stairs, walking up a steep grade (11/19/2020); improved except he is awoken at times and going up an incline is still difficult (01/14/2021); prolonged sitting and flexion based activities is still provokes symptoms (01/23/2021);    Time 12    Period  Weeks    Status Partially Met      PT LONG TERM GOAL #6   Title Patient will improve lumbar extension to  equal or greater than 50% to improve mobility for functional activity.    Baseline Extension: 25% or less (11/19/2020); 50% (01/14/2021);    Time 12    Period Weeks    Status Achieved                   Plan - 01/23/21 0933     Clinical Impression Statement Patient has attended 12 physical therapy sessions since starting this episode of care on 11/19/2020. He has made good progress and reports significant improvement in his pain control and function including improved sleep. He still gets pain in his R lower leg with flexion based activities and postures but feels confident in discharging at this point to independent management. Patient is therefore discharged from PT at this time with a long term HEP.   ?    Personal Factors and Comorbidities Age;Past/Current Experience;Time since onset of injury/illness/exacerbation    Examination-Activity Limitations Stairs;Caring for Others;Sit;Sleep    Examination-Participation Restrictions Cleaning;Laundry;Shop;Community Activity;Meal Prep;Interpersonal Relationship;Other   creates a problem for taking care of everything at the home including his wife who has dementia (bathing, dressing, meals, etc), sleeping, sitting, traveling, going up stairs, walking up a steep grade.   Stability/Clinical Decision Making Stable/Uncomplicated    Rehab Potential Good    PT Frequency 2x / week    PT Duration 12 weeks    PT Treatment/Interventions ADLs/Self Care Home Management;Cryotherapy;Moist Heat;Electrical Stimulation;Gait training;Stair training;Functional mobility training;Neuromuscular re-education;Balance training;Therapeutic exercise;Therapeutic activities;Patient/family education;Manual techniques;Dry needling;Passive range of motion;Joint Manipulations    PT Next Visit Plan Patient is discharged from PT at this time with a long term HEP    PT [place] Access Code: [email or web address]    Consulted and Agree with Plan of Care Patient             Patient will benefit from skilled therapeutic intervention in order to improve the following deficits and impairments:  Improper body mechanics, Pain, Postural dysfunction, Increased muscle spasms, Decreased mobility, Decreased activity tolerance, Decreased endurance, Decreased range of motion, Decreased strength, Hypomobility, Impaired perceived functional ability, Impaired flexibility  Visit Diagnosis: Chronic right-sided low back pain with right-sided sciatica  Muscle weakness (generalized)     Problem List Patient Active Problem List   Diagnosis Date Noted   Chronic low back pain without sciatica 11/21/2020   Hearing loss    Kidney stones    Aortic atherosclerosis ([place]) 03/24/2020   History of 2019 novel coronavirus disease (COVID-19) 03/24/2020   History of stroke 10/05/2019   S/P epidural steroid injection 05/2018   PAF (paroxysmal atrial fibrillation) ([place]) 08/07/2017   CAD (coronary artery disease) 08/07/2017   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 Graylon Good, PT, DPT 01/23/21, 9:51 AM  [facility] [address] [place], [address] Phone: [phone]   Fax:  [phone]  Name: JAPHETH DIEKMAN MRN: [id number] Date of Birth: [birth year]

## 2021-02-01 ENCOUNTER — Telehealth: Payer: Medicare Other

## 2021-02-01 ENCOUNTER — Ambulatory Visit (INDEPENDENT_AMBULATORY_CARE_PROVIDER_SITE_OTHER): Payer: Medicare Other

## 2021-02-01 DIAGNOSIS — G894 Chronic pain syndrome: Secondary | ICD-10-CM

## 2021-02-01 DIAGNOSIS — M545 Low back pain, unspecified: Secondary | ICD-10-CM

## 2021-02-01 DIAGNOSIS — I480 Paroxysmal atrial fibrillation: Secondary | ICD-10-CM

## 2021-02-01 DIAGNOSIS — I2510 Atherosclerotic heart disease of native coronary artery without angina pectoris: Secondary | ICD-10-CM

## 2021-02-01 DIAGNOSIS — M5431 Sciatica, right side: Secondary | ICD-10-CM

## 2021-02-01 DIAGNOSIS — I10 Essential (primary) hypertension: Secondary | ICD-10-CM

## 2021-02-01 DIAGNOSIS — E782 Mixed hyperlipidemia: Secondary | ICD-10-CM

## 2021-02-01 NOTE — Patient Instructions (Signed)
Visit Information  Thank you for taking time to visit with me today. Please don't hesitate to contact me if I can be of assistance to you before our next scheduled telephone appointment.  Following are the goals we discussed today:  RNCM Clinical Goal(s):  Patient will verbalize basic understanding of Atrial Fibrillation, CAD, HTN, HLD, and chronic pain disease process and self health management plan as evidenced by keeping appointments, taking medications, staying active, following dietary recommendations and working with the CCM team to optimize health and well being  take all medications exactly as prescribed and will call provider for medication related questions as evidenced by compliance with medications and calling for refills before running out of medications     attend all scheduled medical appointments: 04-08-2021 at 0920 am, Cardiologist on 02-21-2021 as evidenced by keeping appointments and calling the office for schedule change needs         demonstrate improved and ongoing health management independence as evidenced by stable conditions, stable VS, decreased pain level, and maintain ability to care for self        demonstrate ongoing self health care management ability for effective management of chronic conditions as evidenced by working with the CCM team through collaboration with Consulting civil engineer, provider, and care team.  Maintain self care while caring for his wife who has dementia and reaching out to the CCM team to utilize resources to help with her care and well being.    Interventions: 1:1 collaboration with primary care provider regarding development and update of comprehensive plan of care as evidenced by provider attestation and co-signature Inter-disciplinary care team collaboration (see longitudinal plan of care) Evaluation of current treatment plan related to  self management and patient's adherence to plan as established by provider     A-fib:  (Status: Goal on Track  (progressing): YES.) Long Term Goal  Counseled on increased risk of stroke due to Afib and benefits of anticoagulation for stroke prevention           Reviewed importance of adherence to anticoagulant exactly as prescribed Advised patient to discuss rate and rhythm changes with his heart with provider Counseled on bleeding risk associated with AFIB and importance of self-monitoring for signs/symptoms of bleeding Counseled on avoidance of NSAIDs due to increased bleeding risk with anticoagulants Counseled on importance of regular laboratory monitoring as prescribed Counseled on seeking medical attention after a head injury or if there is blood in the urine/stool Afib action plan reviewed Screening for signs and symptoms of depression related to chronic disease state Assessed social determinant of health barriers   CAD  (Status: Goal on Track (progressing): YES.) Long Term Goal  Assessed understanding of CAD diagnosis Medications reviewed including medications utilized in CAD treatment plan Provided education on importance of blood pressure control in management of CAD; Provided education on Importance of limiting foods high in cholesterol; Counseled on importance of regular laboratory monitoring as prescribed; Counseled on the importance of exercise goals with target of 150 minutes per week Reviewed Importance of taking all medications as prescribed Reviewed Importance of attending all scheduled provider appointments Advised to report any changes in symptoms or exercise tolerance   Hyperlipidemia:  (Status: Goal on Track (progressing): YES.) Long Term Goal       Lab Results  Component Value Date    CHOL 129 10/03/2020    HDL 43 10/03/2020    LDLCALC 65 10/03/2020    TRIG 118 10/03/2020    CHOLHDL 3.0 10/03/2020  Medication review performed; medication list updated in electronic medical record.  Provider established cholesterol goals reviewed; Counseled on importance of regular  laboratory monitoring as prescribed; Provided HLD educational materials; Reviewed role and benefits of statin for ASCVD risk reduction; Discussed strategies to manage statin-induced myalgias; Reviewed importance of limiting foods high in cholesterol; Reviewed exercise goals and target of 150 minutes per week. 02-01-2021: The patient still plays golf if the weather allows two times a week on Tuesdays and Thursdays;   Hypertension: (Status: Goal on Track (progressing): YES.) Long Term Goal  Last practice recorded BP readings:     BP Readings from Last 3 Encounters:  01/07/21 125/79  11/21/20 125/80  11/21/20 124/84  Most recent eGFR/CrCl:       Lab Results  Component Value Date    EGFR 80 10/03/2020    No components found for: CRCL   Evaluation of current treatment plan related to hypertension self management and patient's adherence to plan as established by provider. 02-01-2021: The patient has good control of blood pressures, denies any issues with HTN or heart health.    Provided education to patient re: stroke prevention, s/s of heart attack and stroke; Reviewed prescribed diet heart healthy Reviewed medications with patient and discussed importance of compliance;  Discussed plans with patient for ongoing care management follow up and provided patient with direct contact information for care management team; Advised patient, providing education and rationale, to monitor blood pressure daily and record, calling PCP for findings outside established parameters;  Provided education on prescribed diet heart health;  Discussed complications of poorly controlled blood pressure such as heart disease, stroke, circulatory complications, vision complications, kidney impairment, sexual dysfunction;    Pain:  (Status: Goal on Track (progressing): YES.) Long Term Goal  Pain assessment performed. 02-01-2021: The patient rates his right sided back/leg pain at a 2 today on a scale of 0-10. He completed  outpatient rehab 2 weeks ago and he states it reduced his pain level down to about 1/3rd of the pain he was having. He is still able to play golf 2 times a week if weather permits and his wife is well enough to go to her daytime day care. He feels good and if making sure he has adequate self care. Reviewed the importance of caring for self and managing his mental state and pain level while caring for his wife who has dementia. He states he can see that she is slowly declining and worse. Empathetic listening and support given. Will continue to monitor for changes.  Medications reviewed Reviewed provider established plan for pain management; Discussed importance of adherence to all scheduled medical appointments; Counseled on the importance of reporting any/all new or changed pain symptoms or management strategies to pain management provider; Advised patient to report to care team affect of pain on daily activities; Discussed use of relaxation techniques and/or diversional activities to assist with pain reduction (distraction, imagery, relaxation, massage, acupressure, TENS, heat, and cold application; Reviewed with patient prescribed pharmacological and nonpharmacological pain relief strategies; Advised patient to discuss unresolved pain, changes in level or intensity of pain with provider;   Patient Goals/Self-Care Activities: Take medications as prescribed   Attend all scheduled provider appointments Call pharmacy for medication refills 3-7 days in advance of running out of medications Attend church or other social activities Perform all self care activities independently  Perform IADL's (shopping, preparing meals, housekeeping, managing finances) independently Call provider office for new concerns or questions  Work with the social worker  to address care coordination needs and will continue to work with the clinical team to address health care and disease management related needs call the  Suicide and Crisis Lifeline: 988 call the [place] National Suicide Prevention Lifeline: (407) 659-3506 or TTY: 4841358851 TTY 7017784745) to talk to a trained counselor call 1-800-273-TALK (toll free, 24 hour hotline) if experiencing a Mental Health or [place]  - check pulse (heart) rate before taking medicine - check pulse (heart) rate once a day - make a plan to exercise regularly - make a plan to eat healthy - keep all lab appointments - take medicine as prescribed check blood pressure weekly choose a place to take my blood pressure (home, clinic or office, retail store) write blood pressure results in a log or diary learn about high blood pressure keep a blood pressure log take blood pressure log to all doctor appointments call doctor for signs and symptoms of high blood pressure develop an action plan for high blood pressure keep all doctor appointments take medications for blood pressure exactly as prescribed report new symptoms to your doctor eat more whole grains, fruits and vegetables, lean meats and healthy fats - call for medicine refill 2 or 3 days before it runs out - take all medications exactly as prescribed - call doctor with any symptoms you believe are related to your medicine - call doctor when you experience any new symptoms - go to all doctor appointments as scheduled - adhere to prescribed diet: heart healthy      Our next appointment is by telephone on 04-19-2021 at 0945 am  Please call the care guide team at [phone] if you need to cancel or reschedule your appointment.   If you are experiencing a Mental Health or [place] or need someone to talk to, please call the Suicide and Crisis Lifeline: 988 call the [place] National Suicide Prevention Lifeline: 8706663754 or TTY: 219-832-6112 TTY 501-168-2403) to talk to a trained counselor call 1-800-273-TALK (toll free, 24 hour hotline)   Patient verbalizes understanding  of instructions provided today and agrees to view in [place].   Noreene Larsson RN, MSN, [facility] Mobile: [phone]

## 2021-02-01 NOTE — Chronic Care Management (AMB) (Signed)
Chronic Care Management   CCM RN Visit Note  02/01/2021 Name: Thomas Miller MRN: [id number] DOB: [birth year]  Subjective: Thomas Miller is a 71 y.o. year old male who is a primary care patient of Vigg, Avanti, MD. The care management team was consulted for assistance with disease management and care coordination needs.    Engaged with patient by telephone for follow up visit in response to provider referral for case management and/or care coordination services.   Consent to Services:  The patient was given information about Chronic Care Management services, agreed to services, and gave verbal consent prior to initiation of services.  Please see initial visit note for detailed documentation.   Patient agreed to services and verbal consent obtained.   Assessment: Review of patient past medical history, allergies, medications, health status, including review of consultants reports, laboratory and other test data, was performed as part of comprehensive evaluation and provision of chronic care management services.   SDOH (Social Determinants of Health) assessments and interventions performed:    CCM Care Plan  Allergies  Allergen Reactions   Doxycycline Rash   Ace Inhibitors Cough    "cough"   Codeine Other (See Comments)    Insomnia   Naprosyn [Naproxen] Rash    Outpatient Encounter Medications as of 02/01/2021  Medication Sig   allopurinol (ZYLOPRIM) 300 MG tablet Take 1 tablet (300 mg total) by mouth daily.   amitriptyline (ELAVIL) 10 MG tablet Take 1 tablet (10 mg total) by mouth at bedtime.   [place] Cider Vinegar 600 MG CAPS Take 600 mg by mouth daily.   aspirin EC 81 MG tablet Take 2 tablets (162 mg total) by mouth daily.   atorvastatin (LIPITOR) 80 MG tablet Take 1 tablet (80 mg total) by mouth daily at 6 PM.   esomeprazole (NEXIUM) 40 MG capsule Take 1 capsule (40 mg total) by mouth daily before breakfast.   gabapentin (NEURONTIN) 100 MG capsule Take 1 capsule (100 mg total) by  mouth at bedtime.   Liniments (DEEP BLUE RELIEF) GEL Apply 1 application topically as needed for pain (joints). In joints   losartan (COZAAR) 25 MG tablet Take 1 tablet (25 mg total) by mouth daily.   metoprolol tartrate (LOPRESSOR) 25 MG tablet Take 1 tablet (25 mg total) by mouth 2 (two) times daily.   nitroGLYCERIN (NITROSTAT) 0.4 MG SL tablet Place 1 tablet (0.4 mg total) under the tongue every 5 (five) minutes as needed for chest pain.   VITAMIN D, CHOLECALCIFEROL, PO Take 2,000 mg by mouth daily.   No facility-administered encounter medications on file as of 02/01/2021.    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   Advanced care planning/counseling discussion 11/27/2016   Insomnia 05/19/2016   Hypertension 10/25/2014   Gout 10/25/2014   Reflux 10/25/2014   Hyperlipemia 10/25/2014   BPH (benign prostatic hyperplasia) 10/25/2014    Conditions to be addressed/monitored:Atrial Fibrillation, CAD, HTN, HLD, and Chronic pain   Care Plan : RNCM: General Plan of Care (Adult) for Chronic Disease Management and Care Coordination Needs  Updates made by  Vanita Ingles, RN since 02/01/2021 12:00 AM     Problem: RNCM: Development of Plan of Care For Chronic Disease Management (AFIB, CAD, HTN, HLD, and Chronic Pain)   Priority: High     Long-Range Goal: RNCM: Effective Management  of Plan of Care For Chronic Disease Management (AFIB, CAD, HTN, HLD, and Chronic Pain)   Start Date: 02/01/2021  Expected End Date: 02/01/2022  Priority: High  Note:   Current Barriers:  Knowledge Deficits related to  plan of care for management of Atrial Fibrillation, CAD, HTN, HLD, and Chronic pain   Chronic Disease Management support and education needs related to Atrial Fibrillation, CAD, HTN, HLD, and Chronic pain  The patient is the primary caregiver of his wife who has dementia   RNCM Clinical Goal(s):  Patient will verbalize basic understanding of Atrial Fibrillation, CAD, HTN, HLD, and chronic pain disease process and self health management plan as evidenced by keeping appointments, taking medications, staying active, following dietary recommendations and working with the CCM team to optimize health and well being  take all medications exactly as prescribed and will call provider for medication related questions as evidenced by compliance with medications and calling for refills before running out of medications     attend all scheduled medical appointments: 04-08-2021 at 0920 am, Cardiologist on 02-21-2021 as evidenced by keeping appointments and calling the office for schedule change needs         demonstrate improved and ongoing health management independence as evidenced by stable conditions, stable VS, decreased pain level, and maintain ability to care for self        demonstrate ongoing self health care management ability for effective management of chronic conditions as evidenced by working with the CCM team through collaboration with Consulting civil engineer, provider, and care team.  Maintain self care while caring for his wife who has dementia and reaching out to the CCM team to utilize resources to help with her care and well being.   Interventions: 1:1 collaboration with primary care provider regarding development and update of comprehensive plan of care as evidenced by provider attestation and co-signature Inter-disciplinary care team collaboration (see longitudinal plan of care) Evaluation of current treatment plan related to  self management and patient's adherence to plan as established by  provider   A-fib:  (Status: Goal on Track (progressing): YES.) Long Term Goal  Counseled on increased risk of stroke due to Afib and benefits of anticoagulation for stroke prevention           Reviewed importance of adherence to anticoagulant exactly as prescribed Advised patient to discuss rate and rhythm changes with his heart with provider Counseled on bleeding risk associated with AFIB and importance of self-monitoring for signs/symptoms of bleeding Counseled on avoidance of NSAIDs due to increased bleeding risk with anticoagulants Counseled on importance of regular laboratory monitoring as prescribed Counseled on seeking medical attention after a head injury or if there is blood in the urine/stool Afib action plan reviewed Screening for signs and symptoms of depression related to chronic disease state Assessed social determinant of health barriers  CAD  (Status: Goal on Track (progressing): YES.) Long Term Goal  Assessed understanding of CAD diagnosis Medications reviewed including medications utilized in CAD treatment plan Provided education on importance of blood pressure control in management of CAD; Provided education on Importance of limiting foods high in cholesterol; Counseled on importance of regular laboratory monitoring as prescribed; Counseled on the importance of exercise goals with target of 150  minutes per week Reviewed Importance of taking all medications as prescribed Reviewed Importance of attending all scheduled provider appointments Advised to report any changes in symptoms or exercise tolerance  Hyperlipidemia:  (Status: Goal on Track (progressing): YES.) Long Term Goal  Lab Results  Component Value Date   CHOL 129 10/03/2020   HDL 43 10/03/2020   LDLCALC 65 10/03/2020   TRIG 118 10/03/2020   CHOLHDL 3.0 10/03/2020     Medication review performed; medication list updated in electronic medical record.  Provider established cholesterol goals  reviewed; Counseled on importance of regular laboratory monitoring as prescribed; Provided HLD educational materials; Reviewed role and benefits of statin for ASCVD risk reduction; Discussed strategies to manage statin-induced myalgias; Reviewed importance of limiting foods high in cholesterol; Reviewed exercise goals and target of 150 minutes per week. 02-01-2021: The patient still plays golf if the weather allows two times a week on Tuesdays and Thursdays;  Hypertension: (Status: Goal on Track (progressing): YES.) Long Term Goal  Last practice recorded BP readings:  BP Readings from Last 3 Encounters:  01/07/21 125/79  11/21/20 125/80  11/21/20 124/84  Most recent eGFR/CrCl:  Lab Results  Component Value Date   EGFR 80 10/03/2020    No components found for: CRCL  Evaluation of current treatment plan related to hypertension self management and patient's adherence to plan as established by provider. 02-01-2021: The patient has good control of blood pressures, denies any issues with HTN or heart health.    Provided education to patient re: stroke prevention, s/s of heart attack and stroke; Reviewed prescribed diet heart healthy Reviewed medications with patient and discussed importance of compliance;  Discussed plans with patient for ongoing care management follow up and provided patient with direct contact information for care management team; Advised patient, providing education and rationale, to monitor blood pressure daily and record, calling PCP for findings outside established parameters;  Provided education on prescribed diet heart health;  Discussed complications of poorly controlled blood pressure such as heart disease, stroke, circulatory complications, vision complications, kidney impairment, sexual dysfunction;   Pain:  (Status: Goal on Track (progressing): YES.) Long Term Goal  Pain assessment performed. 02-01-2021: The patient rates his right sided back/leg pain at a 2 today on a  scale of 0-10. He completed outpatient rehab 2 weeks ago and he states it reduced his pain level down to about 1/3rd of the pain he was having. He is still able to play golf 2 times a week if weather permits and his wife is well enough to go to her daytime day care. He feels good and if making sure he has adequate self care. Reviewed the importance of caring for self and managing his mental state and pain level while caring for his wife who has dementia. He states he can see that she is slowly declining and worse. Empathetic listening and support given. Will continue to monitor for changes.  Medications reviewed Reviewed provider established plan for pain management; Discussed importance of adherence to all scheduled medical appointments; Counseled on the importance of reporting any/all new or changed pain symptoms or management strategies to pain management provider; Advised patient to report to care team affect of pain on daily activities; Discussed use of relaxation techniques and/or diversional activities to assist with pain reduction (distraction, imagery, relaxation, massage, acupressure, TENS, heat, and cold application; Reviewed with patient prescribed pharmacological and nonpharmacological pain relief strategies; Advised patient to discuss unresolved pain, changes in level or intensity of pain with provider;  Patient Goals/Self-Care Activities: Take medications as prescribed   Attend all scheduled provider appointments Call pharmacy for medication refills 3-7 days in advance of running out of medications Attend church or other social activities Perform all self care activities independently  Perform IADL's (shopping, preparing meals, housekeeping, managing finances) independently Call provider office for new concerns or questions  Work with the social worker to address care coordination needs and will continue to work with the clinical team to address health care and disease management  related needs call the Suicide and Crisis Lifeline: 988 call the [place] National Suicide Prevention Lifeline: 986 873 3634 or TTY: 941 343 1090 TTY 6061286192) to talk to a trained counselor call 1-800-273-TALK (toll free, 24 hour hotline) if experiencing a Mental Health or [place]  - check pulse (heart) rate before taking medicine - check pulse (heart) rate once a day - make a plan to exercise regularly - make a plan to eat healthy - keep all lab appointments - take medicine as prescribed check blood pressure weekly choose a place to take my blood pressure (home, clinic or office, retail store) write blood pressure results in a log or diary learn about high blood pressure keep a blood pressure log take blood pressure log to all doctor appointments call doctor for signs and symptoms of high blood pressure develop an action plan for high blood pressure keep all doctor appointments take medications for blood pressure exactly as prescribed report new symptoms to your doctor eat more whole grains, fruits and vegetables, lean meats and healthy fats - call for medicine refill 2 or 3 days before it runs out - take all medications exactly as prescribed - call doctor with any symptoms you believe are related to your medicine - call doctor when you experience any new symptoms - go to all doctor appointments as scheduled - adhere to prescribed diet: heart healthy       Plan:Telephone follow up appointment with care management team member scheduled for:  04-19-2021 at [place] am   Noreene Larsson RN, MSN, [facility] Mobile: [phone]

## 2021-02-20 ENCOUNTER — Encounter: Payer: Self-pay | Admitting: Cardiology

## 2021-02-20 ENCOUNTER — Other Ambulatory Visit: Payer: Self-pay

## 2021-02-20 ENCOUNTER — Ambulatory Visit: Payer: Medicare Other | Admitting: Cardiology

## 2021-02-20 VITALS — BP 124/72 | HR 52 | Ht 69.0 in | Wt 215.6 lb

## 2021-02-20 DIAGNOSIS — I2510 Atherosclerotic heart disease of native coronary artery without angina pectoris: Secondary | ICD-10-CM

## 2021-02-20 DIAGNOSIS — I10 Essential (primary) hypertension: Secondary | ICD-10-CM | POA: Diagnosis not present

## 2021-02-20 DIAGNOSIS — Z8673 Personal history of transient ischemic attack (TIA), and cerebral infarction without residual deficits: Secondary | ICD-10-CM

## 2021-02-20 DIAGNOSIS — I700 Atherosclerosis of aorta: Secondary | ICD-10-CM | POA: Diagnosis not present

## 2021-02-20 DIAGNOSIS — Z951 Presence of aortocoronary bypass graft: Secondary | ICD-10-CM | POA: Diagnosis not present

## 2021-02-20 NOTE — Progress Notes (Signed)
Cardiology Office Note:    Date:  02/20/2021   ID:  Thomas Miller, DOB 1950-10-21, MRN [id number]  PCP:  Loura Pardon, MD  Cardiologist:  Garwin Brothers, MD   Referring MD: Loura Pardon, MD    ASSESSMENT:    1. Coronary artery disease involving native coronary artery of native heart without angina pectoris   2. Aortic atherosclerosis (HCC)   3. Essential (primary) hypertension   4. S/P CABG x 4   5. History of stroke    PLAN:    In order of problems listed above:  Coronary artery disease: Secondary prevention stressed with the patient.  Importance of compliance with diet medication stressed any vocalized understanding.  He has issues with his back but he walks significantly.  He walks at least half an hour a day on a daily basis. Aortic atherosclerosis: Stable at this time and medical management.  Patient on statin therapy. Essential hypertension: Blood pressure stable and diet was emphasized.  Lifestyle modification urged. Mixed dyslipidemia and obesity: Stable at this time.  Weight reduction stressed.  Lipids reviewed from [facility] sheet.  He is going to go back to his doctor in a month or 2 for blood work and send me a copy. Patient will be seen in follow-up appointment in 9 months or earlier if the patient has any concerns    Medication Adjustments/Labs and Tests Ordered: Current medicines are reviewed at length with the patient today.  Concerns regarding medicines are outlined above.  No orders of the defined types were placed in this encounter.  No orders of the defined types were placed in this encounter.    Chief Complaint  Patient presents with   Follow-up     History of Present Illness:    Thomas Miller is a 71 y.o. male.  Patient has past medical history of coronary artery disease, essential hypertension, dyslipidemia.  He denies any problems at this time and takes care of activities of daily living.  No chest pain orthopnea or PND.  He walks on a regular basis  and tells me that he walked 11,000 steps yesterday on the golf [place] without symptoms.  At the time of my evaluation, the patient is alert awake oriented and in no distress.  Past Medical History:  Diagnosis Date   Advanced care planning/counseling discussion 11/27/2016   Aortic atherosclerosis (HCC) 03/24/2020   Noted on CT 03/01/19   Body mass index (BMI) 30.0-30.9, adult 12/11/2020   BPH (benign prostatic hyperplasia) 10/25/2014   CAD (coronary artery disease) 08/07/2017   Chronic low back pain without sciatica 11/21/2020   Essential (primary) hypertension 10/25/2014   GERD (gastroesophageal reflux disease) 10/25/2014   Gout    Hearing loss    History of 2019 novel coronavirus disease (COVID-19) 03/24/2020   On 02/09/20   History of stroke 10/05/2019   Hyperlipemia 10/25/2014   Hypertension    Insomnia 05/19/2016   Kidney stones    PAF (paroxysmal atrial fibrillation) (HCC) 08/07/2017   Pain of right lower extremity 12/03/2020   Reflux 10/25/2014   Right leg numbness 12/03/2020   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   S/P epidural steroid injection 05/2018    Past Surgical History:  Procedure Laterality Date   BACK SURGERY     CORONARY ARTERY BYPASS GRAFT N/A 07/20/2017   Procedure: CORONARY ARTERY BYPASS GRAFTING (CABG) times four using left internal mammary artery to LAD and endoscopically harvested right saphenous vein graft to distal circumflex, intermediate, and PD;  Surgeon:  [name], MD;  Location: [facility] OR;  Service: Open Heart Surgery;  Laterality: N/A;   EPIDURAL BLOCK INJECTION  05/2018   L3injections    EYE SURGERY Bilateral 2013   FOOT SURGERY Bilateral    ingrown toenails   GALLBLADDER SURGERY     LEFT HEART CATH AND CORONARY ANGIOGRAPHY N/A 07/17/2017   Procedure: LEFT HEART CATH AND CORONARY ANGIOGRAPHY;  Surgeon: Yvonne Kendall, MD;  Location: [facility] INVASIVE [facility] LAB;  Service: Cardiovascular;  Laterality: N/A;   SHOULDER SURGERY Right    SPINE SURGERY      Current  Medications: Current Meds  Medication Sig   allopurinol (ZYLOPRIM) 300 MG tablet Take 1 tablet (300 mg total) by mouth daily.   amitriptyline (ELAVIL) 10 MG tablet Take 1 tablet (10 mg total) by mouth at bedtime.   [place] Cider Vinegar 600 MG CAPS Take 600 mg by mouth daily.   aspirin EC 81 MG tablet Take 2 tablets (162 mg total) by mouth daily.   atorvastatin (LIPITOR) 80 MG tablet Take 1 tablet (80 mg total) by mouth daily at 6 PM.   esomeprazole (NEXIUM) 40 MG capsule Take 1 capsule (40 mg total) by mouth daily before breakfast.   gabapentin (NEURONTIN) 100 MG capsule Take 1 capsule (100 mg total) by mouth at bedtime.   Liniments (DEEP BLUE RELIEF) GEL Apply 1 application topically as needed for pain (joints). In joints   losartan (COZAAR) 25 MG tablet Take 1 tablet (25 mg total) by mouth daily.   metoprolol tartrate (LOPRESSOR) 25 MG tablet Take 1 tablet (25 mg total) by mouth 2 (two) times daily.   nitroGLYCERIN (NITROSTAT) 0.4 MG SL tablet Place 1 tablet (0.4 mg total) under the tongue every 5 (five) minutes as needed for chest pain.   VITAMIN D, CHOLECALCIFEROL, PO Take 2,000 mg by mouth daily.     Allergies:   Doxycycline, Ace inhibitors, Codeine, and Naprosyn [naproxen]   Social History   Socioeconomic History   Marital status: Married    Spouse name: Not on file   Number of children: Not on file   Years of education: Not on file   Highest education level: Associate degree: occupational, Scientist, product/process development, or vocational program  Occupational History   Occupation: retired  Tobacco Use   Smoking status: Never   Smokeless tobacco: Never  Vaping Use   Vaping Use: Never used  Substance and Sexual Activity   Alcohol use: Not Currently   Drug use: No   Sexual activity: Yes  Other Topics Concern   Not on file  Social History Narrative   Volunteers and golfs    Social Determinants of Health   Financial Resource Strain: Low Risk    Difficulty of Paying Living Expenses: Not hard at  all  Food Insecurity: No Food Insecurity   Worried About Programme researcher, broadcasting/film/video in the Last Year: Never true   Barista in the Last Year: Never true  Transportation Needs: No Transportation Needs   Lack of Transportation (Medical): No   Lack of Transportation (Non-Medical): No  Physical Activity: Sufficiently Active   Days of Exercise per Week: 7 days   Minutes of Exercise per Session: 60 min  Stress: No Stress Concern Present   Feeling of Stress : Not at all  Social Connections: Socially Integrated   Frequency of Communication with Friends and Family: More than three times a week   Frequency of Social Gatherings with Friends and Family: More than three times a  week   Attends Religious Services: More than 4 times per year   Active Member of Clubs or Organizations: Yes   Attends Engineer, structuralClub or Organization Meetings: More than 4 times per year   Marital Status: Married     Family History: The patient's family history includes Diabetes in his mother; Lung cancer in his father; Stroke in his paternal grandfather.  ROS:   Please see the history of present illness.    All other systems reviewed and are negative.  EKGs/Labs/Other Studies Reviewed:    The following studies were reviewed today: EKG reveals sinus rhythm and nonspecific ST-T changes   Recent Labs: 10/03/2020: ALT 23; BUN 17; Creatinine, Ser 1.00; Hemoglobin 15.7; Platelets 182; Potassium 4.4; Sodium 141; TSH 1.790  Recent Lipid Panel    Component Value Date/Time   CHOL 129 10/03/2020 0818   CHOL 142 06/03/2017 0811   TRIG 118 10/03/2020 0818   TRIG 123 06/03/2017 0811   HDL 43 10/03/2020 0818   CHOLHDL 3.0 10/03/2020 0818   CHOLHDL 3.0 07/18/2017 0249   VLDL 10 07/18/2017 0249   VLDL 25 06/03/2017 0811   LDLCALC 65 10/03/2020 0818    Physical Exam:    VS:  BP 124/72 (BP Location: Left Arm, Patient Position: Sitting)    Pulse (!) 52    Ht 5\' 9"  (1.753 m)    Wt 215 lb 9.6 oz (97.8 kg)    SpO2 97%    BMI 31.84 kg/m      Wt Readings from Last 3 Encounters:  02/20/21 215 lb 9.6 oz (97.8 kg)  01/07/21 213 lb 3.2 oz (96.7 kg)  11/21/20 206 lb 3.2 oz (93.5 kg)     GEN: Patient is in no acute distress HEENT: Normal NECK: No JVD; No carotid bruits LYMPHATICS: No lymphadenopathy CARDIAC: Hear sounds regular, 2/6 systolic murmur at the apex. RESPIRATORY:  Clear to auscultation without rales, wheezing or rhonchi  ABDOMEN: Soft, non-tender, non-distended MUSCULOSKELETAL:  No edema; No deformity  SKIN: Warm and dry NEUROLOGIC:  Alert and oriented x 3 PSYCHIATRIC:  Normal affect   Signed, Garwin Brothersajan R Jshawn Hurta, MD  02/20/2021 10:21 AM    [facility]

## 2021-02-20 NOTE — Patient Instructions (Signed)
Medication Instructions:  No medication changes. *If you need a refill on your cardiac medications before your next appointment, please call your pharmacy*   Lab Work: None ordered If you have labs (blood work) drawn today and your tests are completely normal, you will receive your results only by: . MyChart Message (if you have MyChart) OR . A paper copy in the mail If you have any lab test that is abnormal or we need to change your treatment, we will call you to review the results.   Testing/Procedures: Your physician has requested that you have an echocardiogram. Echocardiography is a painless test that uses sound waves to create images of your heart. It provides your doctor with information about the size and shape of your heart and how well your heart's chambers and valves are working. This procedure takes approximately one hour. There are no restrictions for this procedure.     Follow-Up: At [facility], you and your health needs are our priority.  As part of our continuing mission to provide you with exceptional heart care, we have created designated Provider Care Teams.  These Care Teams include your primary Cardiologist (physician) and Advanced Practice Providers (APPs -  Physician Assistants and Nurse Practitioners) who all work together to provide you with the care you need, when you need it.  We recommend signing up for the patient portal called "MyChart".  Sign up information is provided on this After Visit Summary.  MyChart is used to connect with patients for Virtual Visits (Telemedicine).  Patients are able to view lab/test results, encounter notes, upcoming appointments, etc.  Non-urgent messages can be sent to your provider as well.   To learn more about what you can do with MyChart, go to [email or web address].    Your next appointment:   9 month(s)  The format for your next appointment:   In Person  Provider:   Rajan Revankar, MD   Other  Instructions  Echocardiogram An echocardiogram is a test that uses sound waves (ultrasound) to produce images of the heart. Images from an echocardiogram can provide important information about:  Heart size and shape.  The size and thickness and movement of your heart's walls.  Heart muscle function and strength.  Heart valve function or if you have stenosis. Stenosis is when the heart valves are too narrow.  If blood is flowing backward through the heart valves (regurgitation).  A tumor or infectious growth around the heart valves.  Areas of heart muscle that are not working well because of poor blood flow or injury from a heart attack.  Aneurysm detection. An aneurysm is a weak or damaged part of an artery wall. The wall bulges out from the normal force of blood pumping through the body. Tell a health care provider about:  Any allergies you have.  All medicines you are taking, including vitamins, herbs, eye drops, creams, and over-the-counter medicines.  Any blood disorders you have.  Any surgeries you have had.  Any medical conditions you have.  Whether you are pregnant or may be pregnant. What are the risks? Generally, this is a safe test. However, problems may occur, including an allergic reaction to dye (contrast) that may be used during the test. What happens before the test? No specific preparation is needed. You may eat and drink normally. What happens during the test?  You will take off your clothes from the waist up and put on a hospital gown.  Electrodes or electrocardiogram (ECG)patches may be placed on your   lie down on a table for an ultrasound exam. A gel will be applied to your chest to help sound waves pass through your skin. A handheld device, called a transducer, will be pressed against your chest and moved over your heart. The transducer produces sound waves that travel to  your heart and bounce back (or "echo" back) to the transducer. These sound waves will be captured in real-time and changed into images of your heart that can be viewed on a video monitor. The images will be recorded on a computer and reviewed by your health care provider. You may be asked to change positions or hold your breath for a short time. This makes it easier to get different views or better views of your heart. In some cases, you may receive contrast through an IV in one of your veins. This can improve the quality of the pictures from your heart. The procedure may vary among health care providers and hospitals.   What can I expect after the test? You may return to your normal, everyday life, including diet, activities, and medicines, unless your health care provider tells you not to do that. Follow these instructions at home: It is up to you to get the results of your test. Ask your health care provider, or the department that is doing the test, when your results will be ready. Keep all follow-up visits. This is important. Summary An echocardiogram is a test that uses sound waves (ultrasound) to produce images of the heart. Images from an echocardiogram can provide important information about the size and shape of your heart, heart muscle function, heart valve function, and other possible heart problems. You do not need to do anything to prepare before this test. You may eat and drink normally. After the echocardiogram is completed, you may return to your normal, everyday life, unless your health care provider tells you not to do that. This information is not intended to replace advice given to you by your health care provider. Make sure you discuss any questions you have with your health care provider. Document Revised: 09/06/2019 Document Reviewed: 09/06/2019 Elsevier Patient Education  2021 [employer].

## 2021-02-25 ENCOUNTER — Other Ambulatory Visit: Payer: Self-pay

## 2021-02-25 ENCOUNTER — Ambulatory Visit (INDEPENDENT_AMBULATORY_CARE_PROVIDER_SITE_OTHER): Payer: Medicare Other

## 2021-02-25 DIAGNOSIS — I700 Atherosclerosis of aorta: Secondary | ICD-10-CM

## 2021-02-25 DIAGNOSIS — Z951 Presence of aortocoronary bypass graft: Secondary | ICD-10-CM | POA: Diagnosis not present

## 2021-02-25 DIAGNOSIS — I2510 Atherosclerotic heart disease of native coronary artery without angina pectoris: Secondary | ICD-10-CM | POA: Diagnosis not present

## 2021-02-25 LAB — ECHOCARDIOGRAM COMPLETE
AR max vel: 2.05 cm2
AV Peak grad: 7.3 mmHg
Ao pk vel: 1.35 m/s
Area-P 1/2: 3.51 cm2
S' Lateral: 3.5 cm

## 2021-02-26 DIAGNOSIS — I480 Paroxysmal atrial fibrillation: Secondary | ICD-10-CM

## 2021-02-26 DIAGNOSIS — I2510 Atherosclerotic heart disease of native coronary artery without angina pectoris: Secondary | ICD-10-CM | POA: Diagnosis not present

## 2021-02-26 DIAGNOSIS — E782 Mixed hyperlipidemia: Secondary | ICD-10-CM

## 2021-02-26 DIAGNOSIS — I10 Essential (primary) hypertension: Secondary | ICD-10-CM

## 2021-03-09 LAB — HM HEPATITIS C SCREENING LAB: HM Hepatitis Screen: NEGATIVE

## 2021-03-15 ENCOUNTER — Other Ambulatory Visit: Payer: Self-pay | Admitting: Nurse Practitioner

## 2021-03-15 DIAGNOSIS — E7800 Pure hypercholesterolemia, unspecified: Secondary | ICD-10-CM

## 2021-03-15 DIAGNOSIS — M1000 Idiopathic gout, unspecified site: Secondary | ICD-10-CM

## 2021-03-16 NOTE — Telephone Encounter (Signed)
Requested Prescriptions  Pending Prescriptions Disp Refills   atorvastatin (LIPITOR) 80 MG tablet [Pharmacy Med Name: ATORVASTATIN 80 MG TABLET] 90 tablet 0    Sig: Take 1 tablet (80 mg total) by mouth daily at 6 PM.     Cardiovascular:  Antilipid - Statins Failed - 03/15/2021  1:33 PM      Failed - Lipid Panel in normal range within the last 12 months    Cholesterol, Total  Date Value Ref Range Status  10/03/2020 129 100 - 199 mg/dL Final   Cholesterol Piccolo, Waived  Date Value Ref Range Status  06/03/2017 142 <200 mg/dL Final    Comment:                            Desirable                <200                         Borderline High      200- 239                         High                     >239    LDL Chol Calc (NIH)  Date Value Ref Range Status  10/03/2020 65 0 - 99 mg/dL Final   HDL  Date Value Ref Range Status  10/03/2020 43 >39 mg/dL Final   Triglycerides  Date Value Ref Range Status  10/03/2020 118 0 - 149 mg/dL Final   Triglycerides Piccolo,Waived  Date Value Ref Range Status  06/03/2017 123 <150 mg/dL Final    Comment:                            Normal                   <150                         Borderline High     150 - 199                         High                200 - 499                         Very High                >499          Passed - Patient is not pregnant      Passed - Valid encounter within last 12 months    Recent Outpatient Visits          2 months ago Chronic low back pain without sciatica, unspecified back pain laterality   [facility] Vigg, Avanti, MD   3 months ago Chronic low back pain without sciatica, unspecified back pain laterality   [facility] Vigg, Avanti, MD   4 months ago Sciatica of right side   [facility] Vigg, Avanti, MD   4 months ago Coronary artery disease involving native coronary artery of native heart without angina pectoris   [facility]  Loura Pardon, MD   7 months ago Primary hypertension   [facility] Vigg, Avanti, MD      Future Appointments            In 3 weeks Vigg, Avanti, MD [facility]   In 7 months  [facility]            losartan (COZAAR) 25 MG tablet [Pharmacy Med Name: LOSARTAN POTASSIUM 25 MG TAB] 90 tablet 0    Sig: Take 1 tablet (25 mg total) by mouth daily.     Cardiovascular:  Angiotensin Receptor Blockers Passed - 03/15/2021  1:33 PM      Passed - Cr in normal range and within 180 days    Creatinine, Ser  Date Value Ref Range Status  10/03/2020 1.00 0.76 - 1.27 mg/dL Final         Passed - K in normal range and within 180 days    Potassium  Date Value Ref Range Status  10/03/2020 4.4 3.5 - 5.2 mmol/L Final         Passed - Patient is not pregnant      Passed - Last BP in normal range    BP Readings from Last 1 Encounters:  02/20/21 124/72         Passed - Valid encounter within last 6 months    Recent Outpatient Visits          2 months ago Chronic low back pain without sciatica, unspecified back pain laterality   [facility] Vigg, Avanti, MD   3 months ago Chronic low back pain without sciatica, unspecified back pain laterality   [facility] Vigg, Avanti, MD   4 months ago Sciatica of right side   [facility] Vigg, Avanti, MD   4 months ago Coronary artery disease involving native coronary artery of native heart without angina pectoris   [facility] Vigg, Avanti, MD   7 months ago Primary hypertension   [facility] Vigg, Avanti, MD      Future Appointments            In 3 weeks Vigg, Avanti, MD [facility]   In 7 months  [facility]            allopurinol (ZYLOPRIM) 300 MG tablet [Pharmacy Med Name: ALLOPURINOL 300 MG TABLET] 90 tablet 0    Sig: Take 1 tablet (300 mg total) by mouth daily.     Endocrinology:  Gout Agents -  allopurinol Failed - 03/15/2021  1:33 PM      Failed - Uric Acid in normal range and within 360 days    Uric Acid  Date Value Ref Range Status  03/28/2019 3.2 (L) 3.8 - 8.4 mg/dL Final    Comment:               Therapeutic target for gout patients: <6.0         Passed - Cr in normal range and within 360 days    Creatinine, Ser  Date Value Ref Range Status  10/03/2020 1.00 0.76 - 1.27 mg/dL Final         Passed - Valid encounter within last 12 months    Recent Outpatient Visits          2 months ago Chronic low back pain without sciatica, unspecified back pain laterality   [facility] Vigg, Avanti, MD   3 months  ago Chronic low back pain without sciatica, unspecified back pain laterality   [place] Vigg, Avanti, MD   4 months ago Sciatica of right side   [facility] Vigg, Avanti, MD   4 months ago Coronary artery disease involving native coronary artery of native heart without angina pectoris   [facility] Vigg, Avanti, MD   7 months ago Primary hypertension   [place] Vigg, Avanti, MD      Future Appointments            In 3 weeks Vigg, Avanti, MD [facility]   In 7 months  [facility]           Passed - CBC within normal limits and completed in the last 12 months    WBC  Date Value Ref Range Status  10/03/2020 7.3 3.4 - 10.8 x10E3/uL Final  08/28/2019 10.5 4.0 - 10.5 K/uL Final   RBC  Date Value Ref Range Status  10/03/2020 5.34 4.14 - 5.80 x10E6/uL Final  08/28/2019 5.49 4.22 - 5.81 MIL/uL Final   Hemoglobin  Date Value Ref Range Status  10/03/2020 15.7 13.0 - 17.7 g/dL Final   Hematocrit  Date Value Ref Range Status  10/03/2020 47.2 37.5 - 51.0 % Final   MCHC  Date Value Ref Range Status  10/03/2020 33.3 31.5 - 35.7 g/dL Final  08/28/2019 32.9 30.0 - 36.0 g/dL Final   [facility]  Date Value Ref Range Status  10/03/2020 29.4 26.6 - 33.0 pg Final   08/28/2019 29.3 26.0 - 34.0 pg Final   MCV  Date Value Ref Range Status  10/03/2020 88 79 - 97 fL Final   No results found for: PLTCOUNTKUC, LABPLAT, POCPLA RDW  Date Value Ref Range Status  10/03/2020 13.3 11.6 - 15.4 % Final          metoprolol tartrate (LOPRESSOR) 25 MG tablet [Pharmacy Med Name: METOPROLOL TARTRATE 25 MG TAB] 180 tablet 0    Sig: Take 1 tablet (25 mg total) by mouth 2 (two) times daily.     Cardiovascular:  Beta Blockers Passed - 03/15/2021  1:33 PM      Passed - Last BP in normal range    BP Readings from Last 1 Encounters:  02/20/21 124/72         Passed - Last Heart Rate in normal range    Pulse Readings from Last 1 Encounters:  02/20/21 (!) 52         Passed - Valid encounter within last 6 months    Recent Outpatient Visits          2 months ago Chronic low back pain without sciatica, unspecified back pain laterality   [place] Vigg, Avanti, MD   3 months ago Chronic low back pain without sciatica, unspecified back pain laterality   [place] Vigg, Avanti, MD   4 months ago Sciatica of right side   [facility] Vigg, Avanti, MD   4 months ago Coronary artery disease involving native coronary artery of native heart without angina pectoris   [facility] Vigg, Avanti, MD   7 months ago Primary hypertension   [place], MD      Future Appointments            In 3 weeks Vigg, Avanti, MD [facility]   In 7 months  [facility]

## 2021-03-27 ENCOUNTER — Encounter: Payer: Self-pay | Admitting: Internal Medicine

## 2021-04-01 ENCOUNTER — Other Ambulatory Visit: Payer: Medicare Other

## 2021-04-01 ENCOUNTER — Other Ambulatory Visit: Payer: Self-pay

## 2021-04-01 DIAGNOSIS — I2510 Atherosclerotic heart disease of native coronary artery without angina pectoris: Secondary | ICD-10-CM | POA: Diagnosis not present

## 2021-04-01 DIAGNOSIS — E782 Mixed hyperlipidemia: Secondary | ICD-10-CM

## 2021-04-02 LAB — LIPID PANEL
Chol/HDL Ratio: 3 ratio (ref 0.0–5.0)
Cholesterol, Total: 132 mg/dL (ref 100–199)
HDL: 44 mg/dL (ref >39)
LDL Chol Calc (NIH): 62 mg/dL (ref 0–99)
Triglycerides: 153 mg/dL — ABNORMAL HIGH (ref 0–149)
VLDL Cholesterol Cal: 26 mg/dL (ref 5–40)

## 2021-04-05 ENCOUNTER — Telehealth: Payer: Medicare Other

## 2021-04-08 ENCOUNTER — Encounter: Payer: Self-pay | Admitting: Internal Medicine

## 2021-04-08 ENCOUNTER — Other Ambulatory Visit: Payer: Self-pay | Admitting: Internal Medicine

## 2021-04-08 ENCOUNTER — Other Ambulatory Visit: Payer: Self-pay

## 2021-04-08 ENCOUNTER — Ambulatory Visit (INDEPENDENT_AMBULATORY_CARE_PROVIDER_SITE_OTHER): Payer: Medicare Other | Admitting: Internal Medicine

## 2021-04-08 VITALS — BP 122/70 | HR 56 | Temp 100.0°F | Ht 69.02 in | Wt 213.8 lb

## 2021-04-08 DIAGNOSIS — I10 Essential (primary) hypertension: Secondary | ICD-10-CM | POA: Diagnosis not present

## 2021-04-08 DIAGNOSIS — I2510 Atherosclerotic heart disease of native coronary artery without angina pectoris: Secondary | ICD-10-CM

## 2021-04-08 DIAGNOSIS — E782 Mixed hyperlipidemia: Secondary | ICD-10-CM

## 2021-04-08 DIAGNOSIS — E119 Type 2 diabetes mellitus without complications: Secondary | ICD-10-CM | POA: Diagnosis not present

## 2021-04-08 LAB — URINALYSIS, ROUTINE W REFLEX MICROSCOPIC
Bilirubin, UA: NEGATIVE
Glucose, UA: NEGATIVE
Ketones, UA: NEGATIVE
Leukocytes,UA: NEGATIVE
Nitrite, UA: NEGATIVE
Protein,UA: NEGATIVE
RBC, UA: NEGATIVE
Specific Gravity, UA: >1.03 — ABNORMAL HIGH (ref 1.005–1.030)
Urobilinogen, Ur: 0.2 mg/dL (ref 0.2–1.0)
pH, UA: 5.5 (ref 5.0–7.5)

## 2021-04-08 NOTE — Progress Notes (Signed)
? ?BP 122/70   Pulse (!) 56   Temp 100 ?F (37.8 ?C) (Oral)   Ht 5' 9.02" (1.753 m)   Wt 213 lb 12.8 oz (97 kg)   SpO2 96%   BMI 31.56 kg/m?   ? ?Subjective:  ? ? Patient ID: Thomas Miller, male    DOB: [birth year], 71 y.o.   MRN: [id number] ? ?Chief Complaint  ?Patient presents with  ?? Back Pain  ?  Follow up on back pain, patient states it has resolved.  ? ? ?HPI: ?[name] is a 71 y.o. male ? ?Back Pain ?This is a chronic problem. The current episode started more than 1 month ago. The problem occurs rarely. The problem has been rapidly improving since onset. The pain is present in the lumbar spine. The pain is mild. Pertinent negatives include no abdominal pain, bladder incontinence, chest pain, dysuria, fever, headaches, numbness or weakness.  ? ?Chief Complaint  ?Patient presents with  ?? Back Pain  ?  Follow up on back pain, patient states it has resolved.  ? ? ?Relevant past medical, surgical, family and social history reviewed and updated as indicated. Interim medical history since our last visit reviewed. ?Allergies and medications reviewed and updated. ? ?Review of Systems  ?Constitutional:  Negative for activity change, appetite change, chills, fatigue and fever.  ?HENT:  Negative for congestion, ear discharge, ear pain and facial swelling.   ?Eyes:  Negative for pain, discharge and itching.  ?Respiratory:  Negative for cough, chest tightness, shortness of breath and wheezing.   ?Cardiovascular:  Negative for chest pain, palpitations and leg swelling.  ?Gastrointestinal:  Negative for abdominal distention, abdominal pain, blood in stool, constipation, diarrhea, nausea and vomiting.  ?Endocrine: Negative for cold intolerance, heat intolerance, polydipsia, polyphagia and polyuria.  ?Genitourinary:  Negative for bladder incontinence, difficulty urinating, dysuria, flank pain, frequency, hematuria and urgency.  ?Musculoskeletal:  Positive for back pain. Negative for arthralgias, gait problem, joint  swelling and myalgias.  ?Skin:  Negative for color change, rash and wound.  ?Neurological:  Negative for dizziness, tremors, speech difficulty, weakness, light-headedness, numbness and headaches.  ?Hematological:  Does not bruise/bleed easily.  ?Psychiatric/Behavioral:  Negative for agitation, confusion, decreased concentration, sleep disturbance and suicidal ideas.   ? ?Per HPI unless specifically indicated above ? ?   ?Objective:  ?  ?BP 122/70   Pulse (!) 56   Temp 100 ?F (37.8 ?C) (Oral)   Ht 5' 9.02" (1.753 m)   Wt 213 lb 12.8 oz (97 kg)   SpO2 96%   BMI 31.56 kg/m?   ?Wt Readings from Last 3 Encounters:  ?04/08/21 213 lb 12.8 oz (97 kg)  ?02/20/21 215 lb 9.6 oz (97.8 kg)  ?01/07/21 213 lb 3.2 oz (96.7 kg)  ?  ?Physical Exam ?Vitals and nursing note reviewed.  ?Constitutional:   ?   General: He is not in acute distress. ?   Appearance: Normal appearance. He is not ill-appearing or diaphoretic.  ?Cardiovascular:  ?   Rate and Rhythm: Normal rate and regular rhythm.  ?   Heart sounds: No murmur heard. ?  No friction rub. No gallop.  ?Pulmonary:  ?   Effort: No respiratory distress.  ?   Breath sounds: No stridor. No wheezing or rhonchi.  ?Chest:  ?   Chest wall: No tenderness.  ?Abdominal:  ?   General: Bowel sounds are normal.  ?   Palpations: Abdomen is soft. There is no mass.  ?   Tenderness:  There is no abdominal tenderness.  ?Musculoskeletal:     ?   General: No swelling, tenderness or deformity.  ?   Cervical back: Normal range of motion and neck supple. No rigidity or tenderness.  ?   Right lower leg: No edema.  ?   Left lower leg: No edema.  ?Skin: ?   General: Skin is warm and dry.  ?Neurological:  ?   Mental Status: He is alert.  ? ?Results for orders placed or performed in visit on 04/08/21  ?Urinalysis, Routine w reflex microscopic  ?Result Value Ref Range  ? Specific Gravity, UA >1.030 (H) 1.005 - 1.030  ? pH, UA 5.5 5.0 - 7.5  ? Color, UA Yellow Yellow  ? Appearance Ur Clear Clear  ?  Leukocytes,UA Negative Negative  ? Protein,UA Negative Negative/Trace  ? Glucose, UA Negative Negative  ? Ketones, UA Negative Negative  ? RBC, UA Negative Negative  ? Bilirubin, UA Negative Negative  ? Urobilinogen, Ur 0.2 0.2 - 1.0 mg/dL  ? Nitrite, UA Negative Negative  ? ?   ? ? ?Current Outpatient Medications:  ??  allopurinol (ZYLOPRIM) 300 MG tablet, Take 1 tablet (300 mg total) by mouth daily., Disp: 90 tablet, Rfl: 0 ??  amitriptyline (ELAVIL) 10 MG tablet, Take 1 tablet (10 mg total) by mouth at bedtime., Disp: 30 tablet, Rfl: 0 ??  Stocking Cider Vinegar 600 MG CAPS, Take 600 mg by mouth daily., Disp: , Rfl:  ??  aspirin EC 81 MG tablet, Take 2 tablets (162 mg total) by mouth daily., Disp: 90 tablet, Rfl: 3 ??  atorvastatin (LIPITOR) 80 MG tablet, Take 1 tablet (80 mg total) by mouth daily at 6 PM., Disp: 90 tablet, Rfl: 0 ??  esomeprazole (NEXIUM) 40 MG capsule, Take 1 capsule (40 mg total) by mouth daily before breakfast., Disp: 90 capsule, Rfl: 1 ??  gabapentin (NEURONTIN) 100 MG capsule, Take 1 capsule (100 mg total) by mouth at bedtime., Disp: 90 capsule, Rfl: 0 ??  Liniments (DEEP BLUE RELIEF) GEL, Apply 1 application topically as needed for pain (joints). In joints, Disp: , Rfl:  ??  losartan (COZAAR) 25 MG tablet, Take 1 tablet (25 mg total) by mouth daily., Disp: 90 tablet, Rfl: 0 ??  metoprolol tartrate (LOPRESSOR) 25 MG tablet, Take 1 tablet (25 mg total) by mouth 2 (two) times daily., Disp: 180 tablet, Rfl: 0 ??  nitroGLYCERIN (NITROSTAT) 0.4 MG SL tablet, Place 1 tablet (0.4 mg total) under the tongue every 5 (five) minutes as needed for chest pain., Disp: 25 tablet, Rfl: 6 ??  VITAMIN D, CHOLECALCIFEROL, PO, Take 2,000 mg by mouth daily., Disp: , Rfl:   ? ? ?Assessment & Plan:  ?Scaitica / neuropathy is better  ?-is on neurontin and amitriptyline spacing then out like suggested.  ?- Advised NOT to do push ups or anyt aggressive / strenuous back exercises, ok to do streches and mild exercise for  back as pain is better with PT. Plays golf twice a week. Is active.  ?Ok to get PT and exercise at home twice a day.  ?Does his elliptical and rides his bike.  ?  ?2. [name] CAD chronic stable, fu and mx per cards  ?Is on ASA. Statins, NG for such as well as ACE and Bblockers.  ? ?4. Hld : recheck FLP, check LFT's work on diet, SE of meds explained to pt. low fat and high fiber diet explained to pt. ?  ?Problem List Items Addressed This Visit   ? ?  ?  Cardiovascular and Mediastinum  ? Essential (primary) hypertension  ? Relevant Orders  ? CBC with Differential/Platelet  ? Comp. Metabolic Panel (14)  ? TSH  ? Urinalysis, Routine w reflex microscopic (Completed)  ? CAD (coronary artery disease)  ? Relevant Orders  ? CBC with Differential/Platelet  ? Comp. Metabolic Panel (14)  ? TSH  ? Urinalysis, Routine w reflex microscopic (Completed)  ?  ? Other  ? Hyperlipemia  ? Relevant Orders  ? CBC with Differential/Platelet  ? Comp. Metabolic Panel (14)  ? TSH  ? Urinalysis, Routine w reflex microscopic (Completed)  ? ?Other Visit Diagnoses   ? ? Diabetes mellitus without complication ([place])    -  Primary  ? Relevant Orders  ? CBC with Differential/Platelet  ? Comp. Metabolic Panel (14)  ? TSH  ? Urinalysis, Routine w reflex microscopic (Completed)  ? ?  ?  ? ?Orders Placed This Encounter  ?Procedures  ?? CBC with Differential/Platelet  ?? Comp. Metabolic Panel (14)  ?? TSH  ?? Urinalysis, Routine w reflex microscopic  ?  ? ?No orders of the defined types were placed in this encounter. ?  ? ?Follow up plan: ?Return in about 6 months (around 10/09/2021). ? ? ? ?

## 2021-04-09 LAB — COMP. METABOLIC PANEL (14)
ALT: 27 IU/L (ref 0–44)
AST: 26 IU/L (ref 0–40)
Albumin/Globulin Ratio: 2.2 (ref 1.2–2.2)
Albumin: 4.8 g/dL — ABNORMAL HIGH (ref 3.7–4.7)
Alkaline Phosphatase: 106 IU/L (ref 44–121)
BUN/Creatinine Ratio: 12 (ref 10–24)
BUN: 13 mg/dL (ref 8–27)
Bilirubin Total: 0.5 mg/dL (ref 0.0–1.2)
CO2: 23 mmol/L (ref 20–29)
Calcium: 9.7 mg/dL (ref 8.6–10.2)
Chloride: 103 mmol/L (ref 96–106)
Creatinine, Ser: 1.06 mg/dL (ref 0.76–1.27)
Globulin, Total: 2.2 g/dL (ref 1.5–4.5)
Glucose: 86 mg/dL (ref 70–99)
Potassium: 4.7 mmol/L (ref 3.5–5.2)
Sodium: 140 mmol/L (ref 134–144)
Total Protein: 7 g/dL (ref 6.0–8.5)
eGFR: 75 mL/min/{1.73_m2} (ref >59)

## 2021-04-09 LAB — CBC WITH DIFFERENTIAL/PLATELET
Basophils Absolute: 0.1 10*3/uL (ref 0.0–0.2)
Basos: 1 %
EOS (ABSOLUTE): 0.3 10*3/uL (ref 0.0–0.4)
Eos: 4 %
Hematocrit: 49.9 % (ref 37.5–51.0)
Hemoglobin: 16.7 g/dL (ref 13.0–17.7)
Immature Grans (Abs): 0 10*3/uL (ref 0.0–0.1)
Immature Granulocytes: 0 %
Lymphocytes Absolute: 2.5 10*3/uL (ref 0.7–3.1)
Lymphs: 32 %
MCH: 29.7 pg (ref 26.6–33.0)
MCHC: 33.5 g/dL (ref 31.5–35.7)
MCV: 89 fL (ref 79–97)
Monocytes Absolute: 0.9 10*3/uL (ref 0.1–0.9)
Monocytes: 12 %
Neutrophils Absolute: 4 10*3/uL (ref 1.4–7.0)
Neutrophils: 51 %
Platelets: 200 10*3/uL (ref 150–450)
RBC: 5.62 x10E6/uL (ref 4.14–5.80)
RDW: 12.6 % (ref 11.6–15.4)
WBC: 7.9 10*3/uL (ref 3.4–10.8)

## 2021-04-09 LAB — TSH: TSH: 2.24 u[IU]/mL (ref 0.450–4.500)

## 2021-04-09 NOTE — Telephone Encounter (Signed)
Requested Prescriptions  ?Pending Prescriptions Disp Refills  ?? gabapentin (NEURONTIN) 100 MG capsule [Pharmacy Med Name: GABAPENTIN 100 MG CAPSULE] 90 capsule 0  ?  Sig: Take 1 capsule (100 mg total) by mouth at bedtime.  ?  ? Neurology: Anticonvulsants - gabapentin Passed - 04/08/2021 11:16 AM  ?  ?  Passed - Cr in normal range and within 360 days  ?  Creatinine, Ser  ?Date Value Ref Range Status  ?04/08/2021 1.06 0.76 - 1.27 mg/dL Final  ?   ?  ?  Passed - Completed PHQ-2 or PHQ-9 in the last 360 days  ?  ?  Passed - Valid encounter within last 12 months  ?  Recent Outpatient Visits   ?      ? Yesterday Diabetes mellitus without complication (HCC)  ? [facility] Vigg, Avanti, MD  ? 3 months ago Chronic low back pain without sciatica, unspecified back pain laterality  ? [facility] Vigg, Avanti, MD  ? 4 months ago Chronic low back pain without sciatica, unspecified back pain laterality  ? [facility] Vigg, Avanti, MD  ? 5 months ago Sciatica of right side  ? [facility] Vigg, Avanti, MD  ? 5 months ago Coronary artery disease involving native coronary artery of native heart without angina pectoris  ? [facility] Vigg, Avanti, MD  ?  ?  ?Future Appointments   ?        ? In 6 months Vigg, Avanti, MD [facility]  ? In 7 months  [facility]  ?  ? ?  ?  ?  ? ?

## 2021-04-16 ENCOUNTER — Other Ambulatory Visit: Payer: Self-pay | Admitting: Internal Medicine

## 2021-04-18 NOTE — Telephone Encounter (Signed)
Requested Prescriptions  ?Pending Prescriptions Disp Refills  ?? amitriptyline (ELAVIL) 10 MG tablet [Pharmacy Med Name: AMITRIPTYLINE HCL 10 MG TAB] 90 tablet 0  ?  Sig: Take 1 tablet (10 mg total) by mouth at bedtime.  ?  ? Psychiatry:  Antidepressants - Heterocyclics (TCAs) Passed - 04/16/2021 12:36 PM  ?  ?  Passed - Valid encounter within last 6 months  ?  Recent Outpatient Visits   ?      ? 1 week ago Diabetes mellitus without complication (HCC)  ? [facility] Vigg, Avanti, MD  ? 3 months ago Chronic low back pain without sciatica, unspecified back pain laterality  ? [facility] Vigg, Avanti, MD  ? 4 months ago Chronic low back pain without sciatica, unspecified back pain laterality  ? [facility] Vigg, Avanti, MD  ? 5 months ago Sciatica of right side  ? [facility] Vigg, Avanti, MD  ? 5 months ago Coronary artery disease involving native coronary artery of native heart without angina pectoris  ? [facility] Vigg, Avanti, MD  ?  ?  ?Future Appointments   ?        ? In 5 months Vigg, Avanti, MD [facility]  ? In 6 months  [facility]  ?  ? ?  ?  ?  ? ?

## 2021-04-19 ENCOUNTER — Telehealth: Payer: Medicare Other

## 2021-04-19 ENCOUNTER — Ambulatory Visit (INDEPENDENT_AMBULATORY_CARE_PROVIDER_SITE_OTHER): Payer: Medicare Other

## 2021-04-19 DIAGNOSIS — I10 Essential (primary) hypertension: Secondary | ICD-10-CM

## 2021-04-19 DIAGNOSIS — I2510 Atherosclerotic heart disease of native coronary artery without angina pectoris: Secondary | ICD-10-CM

## 2021-04-19 DIAGNOSIS — E782 Mixed hyperlipidemia: Secondary | ICD-10-CM

## 2021-04-19 DIAGNOSIS — M545 Low back pain, unspecified: Secondary | ICD-10-CM

## 2021-04-19 DIAGNOSIS — I480 Paroxysmal atrial fibrillation: Secondary | ICD-10-CM

## 2021-04-19 NOTE — Patient Instructions (Signed)
Visit Information ? ?Thank you for taking time to visit with me today. Please don't hesitate to contact me if I can be of assistance to you before our next scheduled telephone appointment. ? ?Following are the goals we discussed today:  ?RNCM Clinical Goal(s):  ?Patient will verbalize basic understanding of Atrial Fibrillation, CAD, HTN, HLD, and chronic pain disease process and self health management plan as evidenced by keeping appointments, taking medications, staying active, following dietary recommendations and working with the CCM team to optimize health and well being  ?take all medications exactly as prescribed and will call provider for medication related questions as evidenced by compliance with medications and calling for refills before running out of medications     ?attend all scheduled medical appointments: 10-09-2021 at 0900 am, as evidenced by keeping appointments and calling the office for schedule change needs         ?demonstrate improved and ongoing health management independence as evidenced by stable conditions, stable VS, decreased pain level, and maintain ability to care for self        ?demonstrate ongoing self health care management ability for effective management of chronic conditions as evidenced by working with the CCM team through collaboration with RN Care manager, provider, and care team.  ?Maintain self care while caring for his wife who has dementia and reaching out to the CCM team to utilize resources to help with her care and well being.  ?  ?Interventions: ?1:1 collaboration with primary care provider regarding development and update of comprehensive plan of care as evidenced by provider attestation and co-signature ?Inter-disciplinary care team collaboration (see longitudinal plan of care) ?Evaluation of current treatment plan related to  self management and patient's adherence to plan as established by provider ?  ?  ?A-fib:  (Status: Goal on Track (progressing): YES.) Long  Term Goal  ?Counseled on increased risk of stroke due to Afib and benefits of anticoagulation for stroke prevention           ?Reviewed importance of adherence to anticoagulant exactly as prescribed. 04-19-2021: The patient is compliant with medications and denies any new concerns with medications ?Advised patient to discuss rate and rhythm changes with his heart with provider ?Counseled on bleeding risk associated with AFIB and importance of self-monitoring for signs/symptoms of bleeding ?Counseled on avoidance of NSAIDs due to increased bleeding risk with anticoagulants ?Counseled on importance of regular laboratory monitoring as prescribed. 04-19-2021: Has regular lab work ?Counseled on seeking medical attention after a head injury or if there is blood in the urine/stool ?Afib action plan reviewed. 04-19-2021: The patient has a good understanding of AFIB, sees cardiologist and pcp on a regular basis and denies any new concerns at this time ?Screening for signs and symptoms of depression related to chronic disease state ?Assessed social determinant of health barriers ?  ?CAD  (Status: Goal on Track (progressing): YES.) Long Term Goal  ?   ?BP Readings from Last 3 Encounters:  ?04/08/21 122/70  ?02/20/21 124/72  ?01/07/21 125/79  ?  ?     ?Lab Results  ?Component Value Date  ?  CHOL 132 04/01/2021  ?  HDL 44 04/01/2021  ?  [place] 62 04/01/2021  ?  TRIG 153 (H) 04/01/2021  ?  CHOLHDL 3.0 04/01/2021  ?  ?Assessed understanding of CAD diagnosis. 04-19-2021: The patient has a good understanding of CAD and how to effectively manage his CAD and heart health ?Medications reviewed including medications utilized in CAD treatment plan. 04-19-2021: The  patient is compliant with his medications ?Provided education on importance of blood pressure control in management of CAD; ?Provided education on Importance of limiting foods high in cholesterol; ?Counseled on importance of regular laboratory monitoring as prescribed; ?Counseled  on the importance of exercise goals with target of 150 minutes per week ?Reviewed Importance of taking all medications as prescribed ?Reviewed Importance of attending all scheduled provider appointments ?Advised to report any changes in symptoms or exercise tolerance ?  ?Hyperlipidemia:  (Status: Goal on Track (progressing): YES.) Long Term Goal  ?     ?Lab Results  ?Component Value Date  ?  CHOL 132 04/01/2021  ?  HDL 44 04/01/2021  ?  [place] 62 04/01/2021  ?  TRIG 153 (H) 04/01/2021  ?  CHOLHDL 3.0 04/01/2021  ?  ?  ?Medication review performed; medication list updated in electronic medical record. 04-19-2021: The patient is compliant with Atorvastatin 80 mg QD. Denies any issues with medications management ?Provider established cholesterol goals reviewed; ?Counseled on importance of regular laboratory monitoring as prescribed. 04-19-2021: Had recent labwork done; ?Provided HLD educational materials; ?Reviewed role and benefits of statin for ASCVD risk reduction; ?Discussed strategies to manage statin-induced myalgias; ?Reviewed importance of limiting foods high in cholesterol; ?Reviewed exercise goals and target of 150 minutes per week. 04-19-2021: The patient still plays golf if the weather allows two times a week on Tuesdays and Thursdays; ?  ?Hypertension: (Status: Goal on Track (progressing): YES.) Long Term Goal  ?Last practice recorded BP readings:  ?   ?BP Readings from Last 3 Encounters:  ?04/08/21 122/70  ?02/20/21 124/72  ?01/07/21 125/79  ?Most recent eGFR/CrCl:  ?     ?Lab Results  ?Component Value Date  ?  EGFR 75 04/08/2021  ?  No components found for: CRCL ?  ?Evaluation of current treatment plan related to hypertension self management and patient's adherence to plan as established by provider. 04-19-2021: The patient has good control of blood pressures, denies any issues with HTN or heart health.    ?Provided education to patient re: stroke prevention, s/s of heart attack and stroke; ?Reviewed  prescribed diet heart healthy. 04-19-2021: The patient is eating well and denies any issues with dietary compliance ?Reviewed medications with patient and discussed importance of compliance. 04-19-2021: The patient is compliant with medications;  ?Discussed plans with patient for ongoing care management follow up and provided patient with direct contact information for care management team; ?Advised patient, providing education and rationale, to monitor blood pressure daily and record, calling PCP for findings outside established parameters;  ?Provided education on prescribed diet heart health;  ?Discussed complications of poorly controlled blood pressure such as heart disease, stroke, circulatory complications, vision complications, kidney impairment, sexual dysfunction;  ?  ?Pain:  (Status: Goal on Track (progressing): YES.) Long Term Goal  ?Pain assessment performed. 02-01-2021: The patient rates his right sided back/leg pain at a 2 today on a scale of 0-10. He completed outpatient rehab 2 weeks ago and he states it reduced his pain level down to about 1/3rd of the pain he was having. He is still able to play golf 2 times a week if weather permits and his wife is well enough to go to her daytime day care. He feels good and if making sure he has adequate self care. Reviewed the importance of caring for self and managing his mental state and pain level while caring for his wife who has dementia. He states he can see that she is slowly declining  and worse. Empathetic listening and support given. Will continue to monitor for changes. 04-19-2021: The patient states that his pain today is at a 1 or 2. The patient states he is doing well.  ?Medications reviewed. 04-19-2021: The patient is compliant with medications.  ?Reviewed provider established plan for pain management. 04-19-2021: Is compliant with the plan of care ?Discussed importance of adherence to all scheduled medical appointments; ?Counseled on the importance of  reporting any/all new or changed pain symptoms or management strategies to pain management provider; ?Advised patient to report to care team affect of pain on daily activities; ?Discussed use of relaxation techniq

## 2021-04-19 NOTE — Chronic Care Management (AMB) (Signed)
?Chronic Care Management  ? ?CCM RN Visit Note ? ?04/19/2021 ?Name: Thomas Miller MRN: [id number] DOB: May 08, 1950 ? ?Subjective: ?Thomas Miller is a 71 y.o. year old male who is a primary care patient of Vigg, Avanti, MD. The care management team was consulted for assistance with disease management and care coordination needs.   ? ?Engaged with patient by telephone for follow up visit in response to provider referral for case management and/or care coordination services.  ? ?Consent to Services:  ?The patient was given the following information about Chronic Care Management services today, agreed to services, and gave verbal consent: 1. CCM service includes personalized support from designated clinical staff supervised by the primary care provider, including individualized plan of care and coordination with other care providers 2. 24/7 contact phone numbers for assistance for urgent and routine care needs. 3. Service will only be billed when office clinical staff spend 20 minutes or more in a month to coordinate care. 4. Only one practitioner may furnish and bill the service in a calendar month. 5.The patient may stop CCM services at any time (effective at the end of the month) by phone call to the office staff. 6. The patient will be responsible for cost sharing (co-pay) of up to 20% of the service fee (after annual deductible is met). Patient agreed to services and consent obtained. ? ?Patient agreed to services and verbal consent obtained.  ? ?Assessment: Review of patient past medical history, allergies, medications, health status, including review of consultants reports, laboratory and other test data, was performed as part of comprehensive evaluation and provision of chronic care management services.  ? ?SDOH (Social Determinants of Health) assessments and interventions performed:   ? ?CCM Care Plan ? ?Allergies  ?Allergen Reactions  ? Doxycycline Rash  ? Ace Inhibitors Cough  ?  "cough"  ? Codeine Other (See  Comments)  ?  Insomnia  ? Naprosyn [Naproxen] Rash  ? ? ?Outpatient Encounter Medications as of 04/19/2021  ?Medication Sig  ? allopurinol (ZYLOPRIM) 300 MG tablet Take 1 tablet (300 mg total) by mouth daily.  ? amitriptyline (ELAVIL) 10 MG tablet Take 1 tablet (10 mg total) by mouth at bedtime.  ? Korol Cider Vinegar 600 MG CAPS Take 600 mg by mouth daily.  ? aspirin EC 81 MG tablet Take 2 tablets (162 mg total) by mouth daily.  ? atorvastatin (LIPITOR) 80 MG tablet Take 1 tablet (80 mg total) by mouth daily at 6 PM.  ? esomeprazole (NEXIUM) 40 MG capsule Take 1 capsule (40 mg total) by mouth daily before breakfast.  ? gabapentin (NEURONTIN) 100 MG capsule Take 1 capsule (100 mg total) by mouth at bedtime.  ? Liniments (DEEP BLUE RELIEF) GEL Apply 1 application topically as needed for pain (joints). In joints  ? losartan (COZAAR) 25 MG tablet Take 1 tablet (25 mg total) by mouth daily.  ? metoprolol tartrate (LOPRESSOR) 25 MG tablet Take 1 tablet (25 mg total) by mouth 2 (two) times daily.  ? nitroGLYCERIN (NITROSTAT) 0.4 MG SL tablet Place 1 tablet (0.4 mg total) under the tongue every 5 (five) minutes as needed for chest pain.  ? VITAMIN D, CHOLECALCIFEROL, PO Take 2,000 mg by mouth daily.  ? ?No facility-administered encounter medications on file as of 04/19/2021.  ? ? ?Patient Active Problem List  ? Diagnosis Date Noted  ? Body mass index (BMI) 30.0-30.9, adult 12/11/2020  ? Right leg numbness 12/03/2020  ? Pain of right lower extremity 12/03/2020  ?  Chronic low back pain without sciatica 11/21/2020  ? Hearing loss   ? Kidney stones   ? Aortic atherosclerosis ([place]) 03/24/2020  ? History of 2019 novel coronavirus disease (COVID-19) 03/24/2020  ? History of stroke 10/05/2019  ? S/P epidural steroid injection 05/2018  ? PAF (paroxysmal atrial fibrillation) ([place]) 08/07/2017  ? CAD (coronary artery disease) 08/07/2017  ? 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 Advanced care planning/counseling discussion 11/27/2016  ? Insomnia  05/19/2016  ? Essential (primary) hypertension 10/25/2014  ? Gout 10/25/2014  ? Reflux 10/25/2014  ? Hyperlipemia 10/25/2014  ? BPH (benign prostatic hyperplasia) 10/25/2014  ? GERD (gastroesophageal reflux disease) 10/25/2014  ? ? ?Conditions to be addressed/monitored:Atrial Fibrillation, CAD, HTN, HLD, and Chronic pain ? ?Care Plan : RNCM: General Plan of Care (Adult) for Chronic Disease Management and Care Coordination Needs  ?Updates made by Vanita Ingles, RN since 04/19/2021 12:00 AM  ?  ? ?Problem: RNCM: Development of Plan of Care For Chronic Disease Management (AFIB, CAD, HTN, HLD, and Chronic Pain)   ?Priority: High  ?  ? ?Long-Range Goal: RNCM: Effective Management  of Plan of Care For Chronic Disease Management (AFIB, CAD, HTN, HLD, and Chronic Pain)   ?Start Date: 02/01/2021  ?Expected End Date: 02/01/2022  ?Priority: High  ?Note:   ?Current Barriers:  ?Knowledge Deficits related to plan of care for management of Atrial Fibrillation, CAD, HTN, HLD, and Chronic pain   ?Chronic Disease Management support and education needs related to Atrial Fibrillation, CAD, HTN, HLD, and Chronic pain  ?The patient is the primary caregiver of his wife who has dementia  ? ?RNCM Clinical Goal(s):  ?Patient will verbalize basic understanding of Atrial Fibrillation, CAD, HTN, HLD, and chronic pain disease process and self health management plan as evidenced by keeping appointments, taking medications, staying active, following dietary recommendations and working with the CCM team to optimize health and well being  ?take all medications exactly as prescribed and will call provider for medication related questions as evidenced by compliance with medications and calling for refills before running out of medications     ?attend all scheduled medical appointments: 10-09-2021 at 0900 am, as evidenced by keeping appointments and calling the office for schedule change needs         ?demonstrate improved and ongoing health management  independence as evidenced by stable conditions, stable VS, decreased pain level, and maintain ability to care for self        ?demonstrate ongoing self health care management ability for effective management of chronic conditions as evidenced by working with the CCM team through collaboration with RN Care manager, provider, and care team.  ?Maintain self care while caring for his wife who has dementia and reaching out to the CCM team to utilize resources to help with her care and well being.  ? ?Interventions: ?1:1 collaboration with primary care provider regarding development and update of comprehensive plan of care as evidenced by provider attestation and co-signature ?Inter-disciplinary care team collaboration (see longitudinal plan of care) ?Evaluation of current treatment plan related to  self management and patient's adherence to plan as established by provider ? ? ?A-fib:  (Status: Goal on Track (progressing): YES.) Long Term Goal  ?Counseled on increased risk of stroke due to Afib and benefits of anticoagulation for stroke prevention           ?Reviewed importance of adherence to anticoagulant exactly as prescribed. 04-19-2021: The patient is compliant with medications and denies any new concerns with  medications ?Advised patient to discuss rate and rhythm changes with his heart with provider ?Counseled on bleeding risk associated with AFIB and importance of self-monitoring for signs/symptoms of bleeding ?Counseled on avoidance of NSAIDs due to increased bleeding risk with anticoagulants ?Counseled on importance of regular laboratory monitoring as prescribed. 04-19-2021: Has regular lab work ?Counseled on seeking medical attention after a head injury or if there is blood in the urine/stool ?Afib action plan reviewed. 04-19-2021: The patient has a good understanding of AFIB, sees cardiologist and pcp on a regular basis and denies any new concerns at this time ?Screening for signs and symptoms of depression  related to chronic disease state ?Assessed social determinant of health barriers ? ?CAD  (Status: Goal on Track (progressing): YES.) Long Term Goal  ?BP Readings from Last 3 Encounters:  ?04/08/21 122/70  ?02/20/21

## 2021-04-26 DIAGNOSIS — E782 Mixed hyperlipidemia: Secondary | ICD-10-CM

## 2021-04-26 DIAGNOSIS — I10 Essential (primary) hypertension: Secondary | ICD-10-CM | POA: Diagnosis not present

## 2021-04-26 DIAGNOSIS — I2510 Atherosclerotic heart disease of native coronary artery without angina pectoris: Secondary | ICD-10-CM | POA: Diagnosis not present

## 2021-04-26 DIAGNOSIS — I480 Paroxysmal atrial fibrillation: Secondary | ICD-10-CM | POA: Diagnosis not present

## 2021-06-11 ENCOUNTER — Other Ambulatory Visit: Payer: Self-pay | Admitting: Internal Medicine

## 2021-06-11 DIAGNOSIS — E7800 Pure hypercholesterolemia, unspecified: Secondary | ICD-10-CM

## 2021-06-11 DIAGNOSIS — M1000 Idiopathic gout, unspecified site: Secondary | ICD-10-CM

## 2021-06-12 NOTE — Telephone Encounter (Signed)
Requested Prescriptions  ?Pending Prescriptions Disp Refills  ?? atorvastatin (LIPITOR) 80 MG tablet [Pharmacy Med Name: ATORVASTATIN 80 MG TABLET] 90 tablet 1  ?  Sig: Take 1 tablet (80 mg total) by mouth daily at 6 PM.  ?  ? Cardiovascular:  Antilipid - Statins Failed - 06/11/2021  1:57 PM  ?  ?  Failed - Lipid Panel in normal range within the last 12 months  ?  Cholesterol, Total  ?Date Value Ref Range Status  ?04/01/2021 132 100 - 199 mg/dL Final  ? ?Cholesterol [place], [place]  ?Date Value Ref Range Status  ?06/03/2017 142 <200 mg/dL Final  ?  Comment:  ?                          Desirable                <200 ?                        Borderline High      200- 239 ?                        High                     >239 ?  ? ?LDL Chol Calc (NIH)  ?Date Value Ref Range Status  ?04/01/2021 62 0 - 99 mg/dL Final  ? ?HDL  ?Date Value Ref Range Status  ?04/01/2021 44 >39 mg/dL Final  ? ?Triglycerides  ?Date Value Ref Range Status  ?04/01/2021 153 (H) 0 - 149 mg/dL Final  ? ?Triglycerides Piccolo,Waived  ?Date Value Ref Range Status  ?06/03/2017 123 <150 mg/dL Final  ?  Comment:  ?                          Normal                   <150 ?                        Borderline High     150 - 199 ?                        High                200 - 499 ?                        Very High                >499 ?  ? ?  ?  ?  Passed - Patient is not pregnant  ?  ?  Passed - Valid encounter within last 12 months  ?  Recent Outpatient Visits   ?      ? 2 months ago Diabetes mellitus without complication ([place])  ? [facility] Vigg, Avanti, MD  ? 5 months ago Chronic low back pain without sciatica, unspecified back pain laterality  ? [facility] Vigg, Avanti, MD  ? 6 months ago Chronic low back pain without sciatica, unspecified back pain laterality  ? [place], MD  ? 7 months ago Sciatica of right side  ? [place], [place]  ? 7 months  ago Coronary artery disease  involving native coronary artery of native heart without angina pectoris  ? [facility] Vigg, Avanti, MD  ?  ?  ?Future Appointments   ?        ? In 3 months Vigg, Avanti, MD [facility], [facility]  ? In 4 months  [place], [facility]  ?  ? ?  ?  ?  ?? losartan (COZAAR) 25 MG tablet [Pharmacy Med Name: LOSARTAN POTASSIUM 25 MG TAB] 90 tablet 1  ?  Sig: Take 1 tablet (25 mg total) by mouth daily.  ?  ? Cardiovascular:  Angiotensin Receptor Blockers Passed - 06/11/2021  1:57 PM  ?  ?  Passed - Cr in normal range and within 180 days  ?  Creatinine, Ser  ?Date Value Ref Range Status  ?04/08/2021 1.06 0.76 - 1.27 mg/dL Final  ?   ?  ?  Passed - K in normal range and within 180 days  ?  Potassium  ?Date Value Ref Range Status  ?04/08/2021 4.7 3.5 - 5.2 mmol/L Final  ?   ?  ?  Passed - Patient is not pregnant  ?  ?  Passed - Last BP in normal range  ?  BP Readings from Last 1 Encounters:  ?04/08/21 122/70  ?   ?  ?  Passed - Valid encounter within last 6 months  ?  Recent Outpatient Visits   ?      ? 2 months ago Diabetes mellitus without complication ([place])  ? [facility] Vigg, Avanti, MD  ? 5 months ago Chronic low back pain without sciatica, unspecified back pain laterality  ? [facility] Vigg, Avanti, MD  ? 6 months ago Chronic low back pain without sciatica, unspecified back pain laterality  ? [place], MD  ? 7 months ago Sciatica of right side  ? [facility] Vigg, Avanti, MD  ? 7 months ago Coronary artery disease involving native coronary artery of native heart without angina pectoris  ? [facility] Vigg, Avanti, MD  ?  ?  ?Future Appointments   ?        ? In 3 months Vigg, Avanti, MD [facility], [facility]  ? In 4 months  [place], [facility]  ?  ? ?  ?  ?  ?? allopurinol (ZYLOPRIM) 300 MG tablet [Pharmacy Med Name: ALLOPURINOL 300 MG TABLET] 90 tablet 0  ?  Sig: Take 1 tablet (300 mg total)  by mouth daily.  ?  ? Endocrinology:  Gout Agents - allopurinol Failed - 06/11/2021  1:57 PM  ?  ?  Failed - Uric Acid in normal range and within 360 days  ?  Uric Acid  ?Date Value Ref Range Status  ?03/28/2019 3.2 (L) 3.8 - 8.4 mg/dL Final  ?  Comment:  ?             Therapeutic target for gout patients: <6.0  ?   ?  ?  Passed - Cr in normal range and within 360 days  ?  Creatinine, Ser  ?Date Value Ref Range Status  ?04/08/2021 1.06 0.76 - 1.27 mg/dL Final  ?   ?  ?  Passed - Valid encounter within last 12 months  ?  Recent Outpatient Visits   ?      ? 2 months ago Diabetes mellitus without complication ([place])  ? [facility] Vigg, Avanti, MD  ?  5 months ago Chronic low back pain without sciatica, unspecified back pain laterality  ? [facility] Vigg, Avanti, MD  ? 6 months ago Chronic low back pain without sciatica, unspecified back pain laterality  ? [place], MD  ? 7 months ago Sciatica of right side  ? [facility] Vigg, Avanti, MD  ? 7 months ago Coronary artery disease involving native coronary artery of native heart without angina pectoris  ? [facility] Vigg, Avanti, MD  ?  ?  ?Future Appointments   ?        ? In 3 months Vigg, Avanti, MD [facility], [facility]  ? In 4 months  [place], [facility]  ?  ? ?  ?  ?  Passed - CBC within normal limits and completed in the last 12 months  ?  WBC  ?Date Value Ref Range Status  ?04/08/2021 7.9 3.4 - 10.8 x10E3/uL Final  ?08/28/2019 10.5 4.0 - 10.5 K/uL Final  ? ?RBC  ?Date Value Ref Range Status  ?04/08/2021 5.62 4.14 - 5.80 x10E6/uL Final  ?08/28/2019 5.49 4.22 - 5.81 MIL/uL Final  ? ?Hemoglobin  ?Date Value Ref Range Status  ?04/08/2021 16.7 13.0 - 17.7 g/dL Final  ? ?Hematocrit  ?Date Value Ref Range Status  ?04/08/2021 49.9 37.5 - 51.0 % Final  ? ?MCHC  ?Date Value Ref Range Status  ?04/08/2021 33.5 31.5 - 35.7 g/dL Final  ?08/28/2019 32.9 30.0 - 36.0 g/dL Final  ? ?MCH   ?Date Value Ref Range Status  ?04/08/2021 29.7 26.6 - 33.0 pg Final  ?08/28/2019 29.3 26.0 - 34.0 pg Final  ? ?MCV  ?Date Value Ref Range Status  ?04/08/2021 89 79 - 97 fL Final  ? ?No results found for: PLTCOUNTKUC, LABPLAT, [place] ?RDW  ?Date Value Ref Range Status  ?04/08/2021 12.6 11.6 - 15.4 % Final  ? ?  ?  ?  ?? metoprolol tartrate (LOPRESSOR) 25 MG tablet [Pharmacy Med Name: METOPROLOL TARTRATE 25 MG TAB] 180 tablet 1  ?  Sig: Take 1 tablet (25 mg total) by mouth 2 (two) times daily.  ?  ? Cardiovascular:  Beta Blockers Passed - 06/11/2021  1:57 PM  ?  ?  Passed - Last BP in normal range  ?  BP Readings from Last 1 Encounters:  ?04/08/21 122/70  ?   ?  ?  Passed - Last Heart Rate in normal range  ?  Pulse Readings from Last 1 Encounters:  ?04/08/21 (!) 56  ?   ?  ?  Passed - Valid encounter within last 6 months  ?  Recent Outpatient Visits   ?      ? 2 months ago Diabetes mellitus without complication ([place])  ? [facility] Vigg, Avanti, MD  ? 5 months ago Chronic low back pain without sciatica, unspecified back pain laterality  ? [facility] Vigg, Avanti, MD  ? 6 months ago Chronic low back pain without sciatica, unspecified back pain laterality  ? [place], MD  ? 7 months ago Sciatica of right side  ? [facility] Vigg, Avanti, MD  ? 7 months ago Coronary artery disease involving native coronary artery of native heart without angina pectoris  ? [facility] Vigg, Avanti, MD  ?  ?  ?Future Appointments   ?        ? In 3 months Vigg, Avanti, MD [facility]  ?  In 4 months  [place], [facility]  ?  ? ?  ?  ?  ? ?

## 2021-06-12 NOTE — Telephone Encounter (Signed)
Requested medications are due for refill today.  yes ? ?Requested medications are on the active medications list.  yes ? ?Last refill. 03/16/2021 #90 0 refills ? ?Future visit scheduled.   yes ? ?Notes to clinic.  Medication refill failed protocol d/t expired labs. ? ? ? ?Requested Prescriptions  ?Pending Prescriptions Disp Refills  ? allopurinol (ZYLOPRIM) 300 MG tablet [Pharmacy Med Name: ALLOPURINOL 300 MG TABLET] 90 tablet 0  ?  Sig: Take 1 tablet (300 mg total) by mouth daily.  ?  ? Endocrinology:  Gout Agents - allopurinol Failed - 06/11/2021  1:57 PM  ?  ?  Failed - Uric Acid in normal range and within 360 days  ?  Uric Acid  ?Date Value Ref Range Status  ?03/28/2019 3.2 (L) 3.8 - 8.4 mg/dL Final  ?  Comment:  ?             Therapeutic target for gout patients: <6.0  ?   ?  ?  Passed - Cr in normal range and within 360 days  ?  Creatinine, Ser  ?Date Value Ref Range Status  ?04/08/2021 1.06 0.76 - 1.27 mg/dL Final  ?   ?  ?  Passed - Valid encounter within last 12 months  ?  Recent Outpatient Visits   ? ?      ? 2 months ago Diabetes mellitus without complication (HCC)  ? [facility] Vigg, Avanti, MD  ? 5 months ago Chronic low back pain without sciatica, unspecified back pain laterality  ? [facility] Vigg, Avanti, MD  ? 6 months ago Chronic low back pain without sciatica, unspecified back pain laterality  ? [facility] Vigg, Avanti, MD  ? 7 months ago Sciatica of right side  ? [facility] Vigg, Avanti, MD  ? 7 months ago Coronary artery disease involving native coronary artery of native heart without angina pectoris  ? [facility] Vigg, Avanti, MD  ? ?  ?  ?Future Appointments   ? ?        ? In 3 months Vigg, Avanti, MD [facility]  ? In 4 months  [facility]  ? ?  ? ? ?  ?  ?  Passed - CBC within normal limits and completed in the last 12 months  ?  WBC  ?Date Value Ref Range Status  ?04/08/2021 7.9 3.4 -  10.8 x10E3/uL Final  ?08/28/2019 10.5 4.0 - 10.5 K/uL Final  ? ?RBC  ?Date Value Ref Range Status  ?04/08/2021 5.62 4.14 - 5.80 x10E6/uL Final  ?08/28/2019 5.49 4.22 - 5.81 MIL/uL Final  ? ?Hemoglobin  ?Date Value Ref Range Status  ?04/08/2021 16.7 13.0 - 17.7 g/dL Final  ? ?Hematocrit  ?Date Value Ref Range Status  ?04/08/2021 49.9 37.5 - 51.0 % Final  ? ?MCHC  ?Date Value Ref Range Status  ?04/08/2021 33.5 31.5 - 35.7 g/dL Final  ?31/54/0086 76.1 30.0 - 36.0 g/dL Final  ? ?MCH  ?Date Value Ref Range Status  ?04/08/2021 29.7 26.6 - 33.0 pg Final  ?08/28/2019 29.3 26.0 - 34.0 pg Final  ? ?MCV  ?Date Value Ref Range Status  ?04/08/2021 89 79 - 97 fL Final  ? ?No results found for: PLTCOUNTKUC, LABPLAT, POCPLA ?RDW  ?Date Value Ref Range Status  ?04/08/2021 12.6 11.6 - 15.4 % Final  ? ?  ?  ?  ?Signed Prescriptions Disp Refills  ? atorvastatin (LIPITOR) 80 MG tablet 90 tablet 1  ?  Sig: Take 1 tablet (80 mg total) by mouth daily at 6 PM.  ?  ? Cardiovascular:  Antilipid - Statins Failed - 06/11/2021  1:57 PM  ?  ?  Failed - Lipid Panel in normal range within the last 12 months  ?  Cholesterol, Total  ?Date Value Ref Range Status  ?04/01/2021 132 100 - 199 mg/dL Final  ? ?Cholesterol Piccolo, [place]  ?Date Value Ref Range Status  ?06/03/2017 142 <200 mg/dL Final  ?  Comment:  ?                          Desirable                <200 ?                        Borderline High      200- 239 ?                        High                     >239 ?  ? ?LDL Chol Calc (NIH)  ?Date Value Ref Range Status  ?04/01/2021 62 0 - 99 mg/dL Final  ? ?HDL  ?Date Value Ref Range Status  ?04/01/2021 44 >39 mg/dL Final  ? ?Triglycerides  ?Date Value Ref Range Status  ?04/01/2021 153 (H) 0 - 149 mg/dL Final  ? ?Triglycerides Piccolo,Waived  ?Date Value Ref Range Status  ?06/03/2017 123 <150 mg/dL Final  ?  Comment:  ?                          Normal                   <150 ?                        Borderline High     150 - 199 ?                         High                200 - 499 ?                        Very High                >499 ?  ? ?  ?  ?  Passed - Patient is not pregnant  ?  ?  Passed - Valid encounter within last 12 months  ?  Recent Outpatient Visits   ? ?      ? 2 months ago Diabetes mellitus without complication (HCC)  ? [facility] Vigg, Avanti, MD  ? 5 months ago Chronic low back pain without sciatica, unspecified back pain laterality  ? [facility] Vigg, Avanti, MD  ? 6 months ago Chronic low back pain without sciatica, unspecified back pain laterality  ? [facility] Vigg, Avanti, MD  ? 7 months ago Sciatica of right side  ? [facility] Vigg, Avanti, MD  ? 7 months ago Coronary artery disease involving native coronary artery of native heart without angina pectoris  ? [facility] Vigg, Avanti, MD  ? ?  ?  ?  Future Appointments   ? ?        ? In 3 months Vigg, Avanti, MD [facility]  ? In 4 months  [facility]  ? ?  ? ? ?  ?  ?  ? losartan (COZAAR) 25 MG tablet 90 tablet 1  ?  Sig: Take 1 tablet (25 mg total) by mouth daily.  ?  ? Cardiovascular:  Angiotensin Receptor Blockers Passed - 06/11/2021  1:57 PM  ?  ?  Passed - Cr in normal range and within 180 days  ?  Creatinine, Ser  ?Date Value Ref Range Status  ?04/08/2021 1.06 0.76 - 1.27 mg/dL Final  ?   ?  ?  Passed - K in normal range and within 180 days  ?  Potassium  ?Date Value Ref Range Status  ?04/08/2021 4.7 3.5 - 5.2 mmol/L Final  ?   ?  ?  Passed - Patient is not pregnant  ?  ?  Passed - Last BP in normal range  ?  BP Readings from Last 1 Encounters:  ?04/08/21 122/70  ?   ?  ?  Passed - Valid encounter within last 6 months  ?  Recent Outpatient Visits   ? ?      ? 2 months ago Diabetes mellitus without complication (HCC)  ? [facility] Vigg, Avanti, MD  ? 5 months ago Chronic low back pain without sciatica, unspecified back pain laterality  ? [facility]  Vigg, Avanti, MD  ? 6 months ago Chronic low back pain without sciatica, unspecified back pain laterality  ? [facility] Vigg, Avanti, MD  ? 7 months ago Sciatica of right side  ? [facility] Vigg, Avanti, MD  ? 7 months ago Coronary artery disease involving native coronary artery of native heart without angina pectoris  ? [facility] Vigg, Avanti, MD  ? ?  ?  ?Future Appointments   ? ?        ? In 3 months Vigg, Avanti, MD [facility]  ? In 4 months  [facility]  ? ?  ? ? ?  ?  ?  ? metoprolol tartrate (LOPRESSOR) 25 MG tablet 180 tablet 1  ?  Sig: Take 1 tablet (25 mg total) by mouth 2 (two) times daily.  ?  ? Cardiovascular:  Beta Blockers Passed - 06/11/2021  1:57 PM  ?  ?  Passed - Last BP in normal range  ?  BP Readings from Last 1 Encounters:  ?04/08/21 122/70  ?   ?  ?  Passed - Last Heart Rate in normal range  ?  Pulse Readings from Last 1 Encounters:  ?04/08/21 (!) 56  ?   ?  ?  Passed - Valid encounter within last 6 months  ?  Recent Outpatient Visits   ? ?      ? 2 months ago Diabetes mellitus without complication (HCC)  ? [facility] Vigg, Avanti, MD  ? 5 months ago Chronic low back pain without sciatica, unspecified back pain laterality  ? [facility] Vigg, Avanti, MD  ? 6 months ago Chronic low back pain without sciatica, unspecified back pain laterality  ? [facility] Vigg, Avanti, MD  ? 7 months ago Sciatica of right side  ? [facility] Vigg, Avanti, MD  ? 7 months ago Coronary artery disease involving native coronary artery of native heart  without angina pectoris  ? [facility] Vigg, Avanti, MD  ? ?  ?  ?Future Appointments   ? ?        ? In 3 months [facility]  ? In 4 months  [facility]  ? ?  ? ? ?  ?  ?  ?  ?

## 2021-07-12 DIAGNOSIS — H02403 Unspecified ptosis of bilateral eyelids: Secondary | ICD-10-CM | POA: Diagnosis not present

## 2021-07-12 DIAGNOSIS — H2513 Age-related nuclear cataract, bilateral: Secondary | ICD-10-CM | POA: Diagnosis not present

## 2021-07-12 DIAGNOSIS — H43812 Vitreous degeneration, left eye: Secondary | ICD-10-CM | POA: Diagnosis not present

## 2021-07-16 ENCOUNTER — Other Ambulatory Visit: Payer: Self-pay | Admitting: Internal Medicine

## 2021-07-16 NOTE — Telephone Encounter (Signed)
Requested Prescriptions  Pending Prescriptions Disp Refills  . amitriptyline (ELAVIL) 10 MG tablet [Pharmacy Med Name: AMITRIPTYLINE HCL 10 MG TAB] 90 tablet 0    Sig: Take 1 tablet (10 mg total) by mouth at bedtime.     Psychiatry:  Antidepressants - Heterocyclics (TCAs) Passed - 07/16/2021 12:33 PM      Passed - Valid encounter within last 6 months    Recent Outpatient Visits          3 months ago Diabetes mellitus without complication (HCC)   [facility] Vigg, Avanti, MD   6 months ago Chronic low back pain without sciatica, unspecified back pain laterality   [facility] Vigg, Avanti, MD   7 months ago Chronic low back pain without sciatica, unspecified back pain laterality   [facility] Vigg, Avanti, MD   8 months ago Sciatica of right side   [facility] Vigg, Avanti, MD   8 months ago Coronary artery disease involving native coronary artery of native heart without angina pectoris   [facility] Vigg, Avanti, MD      Future Appointments            In 2 months Vigg, Avanti, MD [facility]   In 3 months  [facility], [facility]

## 2021-07-18 ENCOUNTER — Other Ambulatory Visit: Payer: Self-pay | Admitting: Internal Medicine

## 2021-07-18 DIAGNOSIS — K219 Gastro-esophageal reflux disease without esophagitis: Secondary | ICD-10-CM

## 2021-07-18 NOTE — Telephone Encounter (Signed)
Requested Prescriptions  Pending Prescriptions Disp Refills  . esomeprazole (NEXIUM) 40 MG capsule [Pharmacy Med Name: ESOMEPRAZOLE MAG DR 40 MG CAP] 90 capsule 2    Sig: Take 1 capsule (40 mg total) by mouth daily before breakfast.     Gastroenterology: Proton Pump Inhibitors 2 Passed - 07/18/2021 11:00 AM      Passed - ALT in normal range and within 360 days    ALT  Date Value Ref Range Status  04/08/2021 27 0 - 44 IU/L Final   ALT (SGPT) Piccolo, Waived  Date Value Ref Range Status  06/03/2017 37 10 - 47 U/L Final         Passed - AST in normal range and within 360 days    AST  Date Value Ref Range Status  04/08/2021 26 0 - 40 IU/L Final   AST (SGOT) Piccolo, Waived  Date Value Ref Range Status  06/03/2017 35 11 - 38 U/L Final         Passed - Valid encounter within last 12 months    Recent Outpatient Visits          3 months ago Diabetes mellitus without complication (HCC)   [facility] Vigg, Avanti, MD   6 months ago Chronic low back pain without sciatica, unspecified back pain laterality   [facility] Vigg, Avanti, MD   7 months ago Chronic low back pain without sciatica, unspecified back pain laterality   [facility] Vigg, Avanti, MD   8 months ago Sciatica of right side   [facility] Vigg, Avanti, MD   8 months ago Coronary artery disease involving native coronary artery of native heart without angina pectoris   [facility] Vigg, Avanti, MD      Future Appointments            In 2 months Vigg, Avanti, MD [facility], [facility]   In 3 months  [facility], [facility]

## 2021-07-22 ENCOUNTER — Other Ambulatory Visit: Payer: Self-pay | Admitting: Internal Medicine

## 2021-07-26 ENCOUNTER — Telehealth: Payer: Medicare Other

## 2021-07-26 ENCOUNTER — Ambulatory Visit (INDEPENDENT_AMBULATORY_CARE_PROVIDER_SITE_OTHER): Payer: Medicare Other

## 2021-07-26 DIAGNOSIS — I10 Essential (primary) hypertension: Secondary | ICD-10-CM

## 2021-07-26 DIAGNOSIS — M545 Low back pain, unspecified: Secondary | ICD-10-CM

## 2021-07-26 DIAGNOSIS — E782 Mixed hyperlipidemia: Secondary | ICD-10-CM

## 2021-07-26 DIAGNOSIS — G894 Chronic pain syndrome: Secondary | ICD-10-CM

## 2021-07-26 DIAGNOSIS — I2510 Atherosclerotic heart disease of native coronary artery without angina pectoris: Secondary | ICD-10-CM

## 2021-07-26 DIAGNOSIS — I480 Paroxysmal atrial fibrillation: Secondary | ICD-10-CM

## 2021-07-26 NOTE — Patient Instructions (Signed)
Visit Information  Thank you for taking time to visit with me today. Please don't hesitate to contact me if I can be of assistance to you before our next scheduled telephone appointment.  Following are the goals we discussed today:  A-fib:  (Status: Goal Met.) Long Term Goal  07-26-2021: Goals met and care plan is being closed  Counseled on increased risk of stroke due to Afib and benefits of anticoagulation for stroke prevention           Reviewed importance of adherence to anticoagulant exactly as prescribed. 04-19-2021: The patient is compliant with medications and denies any new concerns with medications Advised patient to discuss rate and rhythm changes with his heart with provider Counseled on bleeding risk associated with AFIB and importance of self-monitoring for signs/symptoms of bleeding Counseled on avoidance of NSAIDs due to increased bleeding risk with anticoagulants Counseled on importance of regular laboratory monitoring as prescribed. 04-19-2021: Has regular lab work Counseled on seeking medical attention after a head injury or if there is blood in the urine/stool Afib action plan reviewed. 04-19-2021: The patient has a good understanding of AFIB, sees cardiologist and pcp on a regular basis and denies any new concerns at this time Screening for signs and symptoms of depression related to chronic disease state Assessed social determinant of health barriers   CAD  (Status: Goal Met.) Long Term Goal 07-26-2021: Goals met and care plan is being closed     BP Readings from Last 3 Encounters:  04/08/21 122/70  02/20/21 124/72  01/07/21 125/79         Lab Results  Component Value Date    CHOL 132 04/01/2021    HDL 44 04/01/2021    LDLCALC 62 04/01/2021    TRIG 153 (H) 04/01/2021    CHOLHDL 3.0 04/01/2021    Assessed understanding of CAD diagnosis. 07-26-2021: The patient has a good understanding of CAD and how to effectively manage his CAD and heart health Medications reviewed  including medications utilized in CAD treatment plan. 04-19-2021: The patient is compliant with his medications Provided education on importance of blood pressure control in management of CAD; Provided education on Importance of limiting foods high in cholesterol; Counseled on importance of regular laboratory monitoring as prescribed; Counseled on the importance of exercise goals with target of 150 minutes per week Reviewed Importance of taking all medications as prescribed Reviewed Importance of attending all scheduled provider appointments Advised to report any changes in symptoms or exercise tolerance   Hyperlipidemia:  (Status: Goal Met.) Long Term Goal  07-26-2021: Goals met and care plan is being closed       Lab Results  Component Value Date    CHOL 132 04/01/2021    HDL 44 04/01/2021    LDLCALC 62 04/01/2021    TRIG 153 (H) 04/01/2021    CHOLHDL 3.0 04/01/2021      Medication review performed; medication list updated in electronic medical record. 07-26-2021: The patient is compliant with Atorvastatin 80 mg QD. Denies any issues with medications management Provider established cholesterol goals reviewed; Counseled on importance of regular laboratory monitoring as prescribed. 04-19-2021: Had recent labwork done; Provided HLD educational materials; Reviewed role and benefits of statin for ASCVD risk reduction; Discussed strategies to manage statin-induced myalgias; Reviewed importance of limiting foods high in cholesterol; Reviewed exercise goals and target of 150 minutes per week. 04-19-2021: The patient still plays golf if the weather allows two times a week on Tuesdays and Thursdays;   Hypertension: (Status:  Goal Met.) Long Term Goal  07-26-2021 Goals met and care plan is being closed  Last practice recorded BP readings:     BP Readings from Last 3 Encounters:  04/08/21 122/70  02/20/21 124/72  01/07/21 125/79  Most recent eGFR/CrCl:       Lab Results  Component Value Date     EGFR 75 04/08/2021    No components found for: CRCL   Evaluation of current treatment plan related to hypertension self management and patient's adherence to plan as established by provider. 04-19-2021: The patient has good control of blood pressures, denies any issues with HTN or heart health.    Provided education to patient re: stroke prevention, s/s of heart attack and stroke; Reviewed prescribed diet heart healthy. 04-19-2021: The patient is eating well and denies any issues with dietary compliance Reviewed medications with patient and discussed importance of compliance. 04-19-2021: The patient is compliant with medications;  Discussed plans with patient for ongoing care management follow up and provided patient with direct contact information for care management team; Advised patient, providing education and rationale, to monitor blood pressure daily and record, calling PCP for findings outside established parameters;  Provided education on prescribed diet heart health;  Discussed complications of poorly controlled blood pressure such as heart disease, stroke, circulatory complications, vision complications, kidney impairment, sexual dysfunction;    Pain:  (Status: Goal Met.) Long Term Goal  07-26-2021: Goals met and care plan is being closed  Pain assessment performed. 02-01-2021: The patient rates his right sided back/leg pain at a 2 today on a scale of 0-10. He completed outpatient rehab 2 weeks ago and he states it reduced his pain level down to about 1/3rd of the pain he was having. He is still able to play golf 2 times a week if weather permits and his wife is well enough to go to her daytime day care. He feels good and if making sure he has adequate self care. Reviewed the importance of caring for self and managing his mental state and pain level while caring for his wife who has dementia. He states he can see that she is slowly declining and worse. Empathetic listening and support given. Will  continue to monitor for changes. 04-19-2021: The patient states that his pain today is at a 1 or 2. The patient states he is doing well. 07-26-2021: Denies pain and discomfort today Medications reviewed. 04-19-2021: The patient is compliant with medications.  Reviewed provider established plan for pain management. 04-19-2021: Is compliant with the plan of care Discussed importance of adherence to all scheduled medical appointments; Counseled on the importance of reporting any/all new or changed pain symptoms or management strategies to pain management provider; Advised patient to report to care team affect of pain on daily activities; Discussed use of relaxation techniques and/or diversional activities to assist with pain reduction (distraction, imagery, relaxation, massage, acupressure, TENS, heat, and cold application; Reviewed with patient prescribed pharmacological and nonpharmacological pain relief strategies; Advised patient to discuss unresolved pain, changes in level or intensity of pain with provider;   Patient Goals/Self-Care Activities: Take medications as prescribed   Attend all scheduled provider appointments Call pharmacy for medication refills 3-7 days in advance of running out of medications Attend church or other social activities Perform all self care activities independently  Perform IADL's (shopping, preparing meals, housekeeping, managing finances) independently Call provider office for new concerns or questions  Work with the social worker to address care coordination needs and will continue to  work with the clinical team to address health care and disease management related needs call the Suicide and Crisis Lifeline: 988 call the [place] National Suicide Prevention Lifeline: 276-221-4669 or TTY: 934-767-4370 TTY 770-101-8216) to talk to a trained counselor call 1-800-273-TALK (toll free, 24 hour hotline) if experiencing a Mental Health or [place]  - check  pulse (heart) rate before taking medicine - check pulse (heart) rate once a day - make a plan to exercise regularly - make a plan to eat healthy - keep all lab appointments - take medicine as prescribed check blood pressure weekly choose a place to take my blood pressure (home, clinic or office, retail store) write blood pressure results in a log or diary learn about high blood pressure keep a blood pressure log take blood pressure log to all doctor appointments call doctor for signs and symptoms of high blood pressure develop an action plan for high blood pressure keep all doctor appointments take medications for blood pressure exactly as prescribed report new symptoms to your doctor eat more whole grains, fruits and vegetables, lean meats and healthy fats - call for medicine refill 2 or 3 days before it runs out - take all medications exactly as prescribed - call doctor with any symptoms you believe are related to your medicine - call doctor when you experience any new symptoms - go to all doctor appointments as scheduled - adhere to prescribed diet: heart healthy      No further follow up warranted at this time. The patient has the number for the [facility] and knows to call for changes or new needs.   Please call the care guide team at [phone] if you need an appointment with the [facility].   If you are experiencing a Mental Health or [place] or need someone to talk to, please call the Suicide and Crisis Lifeline: 988 call the [place] National Suicide Prevention Lifeline: 2691310023 or TTY: 330-425-7134 TTY 719-632-3793) to talk to a trained counselor call 1-800-273-TALK (toll free, 24 hour hotline)   Patient verbalizes understanding of instructions and care plan provided today and agrees to view in [place]. Active MyChart status and patient understanding of how to access instructions and care plan via MyChart confirmed with patient.     Noreene Larsson RN, MSN,  [facility] Mobile: [phone]

## 2021-07-26 NOTE — Chronic Care Management (AMB) (Signed)
Chronic Care Management   CCM RN Visit Note  07/26/2021 Name: Thomas Miller MRN: [id number] DOB: 08-19-1950  Subjective: Thomas Miller is a 71 y.o. year old male who is a primary care patient of Vigg, Avanti, MD. The care management team was consulted for assistance with disease management and care coordination needs.    Engaged with patient by telephone for follow up visit in response to provider referral for case management and/or care coordination services.   Consent to Services:  The patient was given information about Chronic Care Management services, agreed to services, and gave verbal consent prior to initiation of services.  Please see initial visit note for detailed documentation.   Patient agreed to services and verbal consent obtained.   Assessment: Review of patient past medical history, allergies, medications, health status, including review of consultants reports, laboratory and other test data, was performed as part of comprehensive evaluation and provision of chronic care management services.   SDOH (Social Determinants of Health) assessments and interventions performed:    CCM Care Plan  Allergies  Allergen Reactions   Doxycycline Rash   Ace Inhibitors Cough    "cough"   Codeine Other (See Comments)    Insomnia   Naprosyn [Naproxen] Rash    Outpatient Encounter Medications as of 07/26/2021  Medication Sig   allopurinol (ZYLOPRIM) 300 MG tablet Take 1 tablet (300 mg total) by mouth daily.   amitriptyline (ELAVIL) 10 MG tablet Take 1 tablet (10 mg total) by mouth at bedtime.   Krager Cider Vinegar 600 MG CAPS Take 600 mg by mouth daily.   aspirin EC 81 MG tablet Take 2 tablets (162 mg total) by mouth daily.   atorvastatin (LIPITOR) 80 MG tablet Take 1 tablet (80 mg total) by mouth daily at 6 PM.   esomeprazole (NEXIUM) 40 MG capsule Take 1 capsule (40 mg total) by mouth daily before breakfast.   gabapentin (NEURONTIN) 100 MG capsule Take 1 capsule (100 mg total)  by mouth at bedtime.   Liniments (DEEP BLUE RELIEF) GEL Apply 1 application topically as needed for pain (joints). In joints   losartan (COZAAR) 25 MG tablet Take 1 tablet (25 mg total) by mouth daily.   metoprolol tartrate (LOPRESSOR) 25 MG tablet Take 1 tablet (25 mg total) by mouth 2 (two) times daily.   nitroGLYCERIN (NITROSTAT) 0.4 MG SL tablet Place 1 tablet (0.4 mg total) under the tongue every 5 (five) minutes as needed for chest pain.   VITAMIN D, CHOLECALCIFEROL, PO Take 2,000 mg by mouth daily.   No facility-administered encounter medications on file as of 07/26/2021.    Patient Active Problem List   Diagnosis Date Noted   Body mass index (BMI) 30.0-30.9, adult 12/11/2020   Right leg numbness 12/03/2020   Pain of right lower extremity 12/03/2020   Chronic low back pain without sciatica 11/21/2020   Hearing loss    Kidney stones    Aortic atherosclerosis (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   Advanced care planning/counseling discussion 11/27/2016   Insomnia 05/19/2016   Essential (primary) hypertension 10/25/2014   Gout 10/25/2014   Reflux 10/25/2014   Hyperlipemia 10/25/2014   BPH (benign prostatic hyperplasia) 10/25/2014   GERD (gastroesophageal reflux disease) 10/25/2014    Conditions to  be addressed/monitored:Atrial Fibrillation, CAD, HTN, HLD, and Chronic pain  Care Plan : RNCM: General Plan of Care (Adult) for Chronic Disease Management and Care Coordination Needs  Updates made by Vanita Ingles, RN since 07/26/2021 12:00 AM  Completed 07/26/2021   Problem: RNCM: Development of Plan of Care For Chronic Disease Management (AFIB, CAD, HTN, HLD, and Chronic Pain) Resolved 07/26/2021  Priority: High     Long-Range Goal:  RNCM: Effective Management  of Plan of Care For Chronic Disease Management (AFIB, CAD, HTN, HLD, and Chronic Pain) Completed 07/26/2021  Start Date: 02/01/2021  Expected End Date: 02/01/2022  Priority: High  Note:   Current Barriers: 07-26-2021: Goals met and care plan is being closed.  The patient states he is doing well. His wife is declining and he has Hospice now coming in and that is a big help. The patient has the [facility] number to call for changes or new concerns. The patient agrees to plan of care being met and knows the [facility] has not rescheduled a follow up appointment with him.  Knowledge Deficits related to plan of care for management of Atrial Fibrillation, CAD, HTN, HLD, and Chronic pain   Chronic Disease Management support and education needs related to Atrial Fibrillation, CAD, HTN, HLD, and Chronic pain  The patient is the primary caregiver of his wife who has dementia   RNCM Clinical Goal(s):  Patient will verbalize basic understanding of Atrial Fibrillation, CAD, HTN, HLD, and chronic pain disease process and self health management plan as evidenced by keeping appointments, taking medications, staying active, following dietary recommendations and working with the CCM team to optimize health and well being  take all medications exactly as prescribed and will call provider for medication related questions as evidenced by compliance with medications and calling for refills before running out of medications     attend all scheduled medical appointments: 10-09-2021 at 0900 am, as evidenced by keeping appointments and calling the office for schedule change needs         demonstrate improved and ongoing health management independence as evidenced by stable conditions, stable VS, decreased pain level, and maintain ability to care for self        demonstrate ongoing self health care management ability for effective management of chronic conditions as evidenced by working with the CCM team through  collaboration with Consulting civil engineer, provider, and care team.  Maintain self care while caring for his wife who has dementia and reaching out to the CCM team to utilize resources to help with her care and well being.   Interventions: 1:1 collaboration with primary care provider regarding development and update of comprehensive plan of care as evidenced by provider attestation and co-signature Inter-disciplinary care team collaboration (see longitudinal plan of care) Evaluation of current treatment plan related to  self management and patient's adherence to plan as established by provider   A-fib:  (Status: Goal Met.) Long Term Goal  07-26-2021: Goals met and care plan is being closed  Counseled on increased risk of stroke due to Afib and benefits of anticoagulation for stroke prevention           Reviewed importance of adherence to anticoagulant exactly as prescribed. 04-19-2021: The patient is compliant with medications and denies any new concerns with medications Advised patient to discuss rate and rhythm changes with his heart with provider Counseled on bleeding risk associated with AFIB and importance of self-monitoring for signs/symptoms of bleeding Counseled on avoidance of NSAIDs due to increased  bleeding risk with anticoagulants Counseled on importance of regular laboratory monitoring as prescribed. 04-19-2021: Has regular lab work Counseled on seeking medical attention after a head injury or if there is blood in the urine/stool Afib action plan reviewed. 04-19-2021: The patient has a good understanding of AFIB, sees cardiologist and pcp on a regular basis and denies any new concerns at this time Screening for signs and symptoms of depression related to chronic disease state Assessed social determinant of health barriers  CAD  (Status: Goal Met.) Long Term Goal 07-26-2021: Goals met and care plan is being closed  BP Readings from Last 3 Encounters:  04/08/21 122/70  02/20/21 124/72   01/07/21 125/79    Lab Results  Component Value Date   CHOL 132 04/01/2021   HDL 44 04/01/2021   LDLCALC 62 04/01/2021   TRIG 153 (H) 04/01/2021   CHOLHDL 3.0 04/01/2021    Assessed understanding of CAD diagnosis. 07-26-2021: The patient has a good understanding of CAD and how to effectively manage his CAD and heart health Medications reviewed including medications utilized in CAD treatment plan. 04-19-2021: The patient is compliant with his medications Provided education on importance of blood pressure control in management of CAD; Provided education on Importance of limiting foods high in cholesterol; Counseled on importance of regular laboratory monitoring as prescribed; Counseled on the importance of exercise goals with target of 150 minutes per week Reviewed Importance of taking all medications as prescribed Reviewed Importance of attending all scheduled provider appointments Advised to report any changes in symptoms or exercise tolerance  Hyperlipidemia:  (Status: Goal Met.) Long Term Goal  07-26-2021: Goals met and care plan is being closed  Lab Results  Component Value Date   CHOL 132 04/01/2021   HDL 44 04/01/2021   LDLCALC 62 04/01/2021   TRIG 153 (H) 04/01/2021   CHOLHDL 3.0 04/01/2021     Medication review performed; medication list updated in electronic medical record. 07-26-2021: The patient is compliant with Atorvastatin 80 mg QD. Denies any issues with medications management Provider established cholesterol goals reviewed; Counseled on importance of regular laboratory monitoring as prescribed. 04-19-2021: Had recent labwork done; Provided HLD educational materials; Reviewed role and benefits of statin for ASCVD risk reduction; Discussed strategies to manage statin-induced myalgias; Reviewed importance of limiting foods high in cholesterol; Reviewed exercise goals and target of 150 minutes per week. 04-19-2021: The patient still plays golf if the weather allows two  times a week on Tuesdays and Thursdays;  Hypertension: (Status: Goal Met.) Long Term Goal  07-26-2021 Goals met and care plan is being closed  Last practice recorded BP readings:  BP Readings from Last 3 Encounters:  04/08/21 122/70  02/20/21 124/72  01/07/21 125/79  Most recent eGFR/CrCl:  Lab Results  Component Value Date   EGFR 75 04/08/2021    No components found for: CRCL  Evaluation of current treatment plan related to hypertension self management and patient's adherence to plan as established by provider. 04-19-2021: The patient has good control of blood pressures, denies any issues with HTN or heart health.    Provided education to patient re: stroke prevention, s/s of heart attack and stroke; Reviewed prescribed diet heart healthy. 04-19-2021: The patient is eating well and denies any issues with dietary compliance Reviewed medications with patient and discussed importance of compliance. 04-19-2021: The patient is compliant with medications;  Discussed plans with patient for ongoing care management follow up and provided patient with direct contact information for care management team; Advised patient,  providing education and rationale, to monitor blood pressure daily and record, calling PCP for findings outside established parameters;  Provided education on prescribed diet heart health;  Discussed complications of poorly controlled blood pressure such as heart disease, stroke, circulatory complications, vision complications, kidney impairment, sexual dysfunction;   Pain:  (Status: Goal Met.) Long Term Goal  07-26-2021: Goals met and care plan is being closed  Pain assessment performed. 02-01-2021: The patient rates his right sided back/leg pain at a 2 today on a scale of 0-10. He completed outpatient rehab 2 weeks ago and he states it reduced his pain level down to about 1/3rd of the pain he was having. He is still able to play golf 2 times a week if weather permits and his wife is well  enough to go to her daytime day care. He feels good and if making sure he has adequate self care. Reviewed the importance of caring for self and managing his mental state and pain level while caring for his wife who has dementia. He states he can see that she is slowly declining and worse. Empathetic listening and support given. Will continue to monitor for changes. 04-19-2021: The patient states that his pain today is at a 1 or 2. The patient states he is doing well. 07-26-2021: Denies pain and discomfort today Medications reviewed. 04-19-2021: The patient is compliant with medications.  Reviewed provider established plan for pain management. 04-19-2021: Is compliant with the plan of care Discussed importance of adherence to all scheduled medical appointments; Counseled on the importance of reporting any/all new or changed pain symptoms or management strategies to pain management provider; Advised patient to report to care team affect of pain on daily activities; Discussed use of relaxation techniques and/or diversional activities to assist with pain reduction (distraction, imagery, relaxation, massage, acupressure, TENS, heat, and cold application; Reviewed with patient prescribed pharmacological and nonpharmacological pain relief strategies; Advised patient to discuss unresolved pain, changes in level or intensity of pain with provider;  Patient Goals/Self-Care Activities: Take medications as prescribed   Attend all scheduled provider appointments Call pharmacy for medication refills 3-7 days in advance of running out of medications Attend church or other social activities Perform all self care activities independently  Perform IADL's (shopping, preparing meals, housekeeping, managing finances) independently Call provider office for new concerns or questions  Work with the social worker to address care coordination needs and will continue to work with the clinical team to address health care and  disease management related needs call the Suicide and Crisis Lifeline: 988 call the [place] National Suicide Prevention Lifeline: 217-222-3620 or TTY: 4708345914 TTY 425-887-7805) to talk to a trained counselor call 1-800-273-TALK (toll free, 24 hour hotline) if experiencing a Mental Health or [place]  - check pulse (heart) rate before taking medicine - check pulse (heart) rate once a day - make a plan to exercise regularly - make a plan to eat healthy - keep all lab appointments - take medicine as prescribed check blood pressure weekly choose a place to take my blood pressure (home, clinic or office, retail store) write blood pressure results in a log or diary learn about high blood pressure keep a blood pressure log take blood pressure log to all doctor appointments call doctor for signs and symptoms of high blood pressure develop an action plan for high blood pressure keep all doctor appointments take medications for blood pressure exactly as prescribed report new symptoms to your doctor eat more whole grains, fruits and  vegetables, lean meats and healthy fats - call for medicine refill 2 or 3 days before it runs out - take all medications exactly as prescribed - call doctor with any symptoms you believe are related to your medicine - call doctor when you experience any new symptoms - go to all doctor appointments as scheduled - adhere to prescribed diet: heart healthy       Plan:No further follow up required: The patient has met the goals of care and care plan has been closed.  The patient knows to call for changes or new concerns.  Noreene Larsson RN, MSN, [facility] Mobile: [phone]

## 2021-09-13 ENCOUNTER — Telehealth: Payer: Self-pay

## 2021-09-13 ENCOUNTER — Other Ambulatory Visit: Payer: Self-pay

## 2021-09-13 DIAGNOSIS — M1000 Idiopathic gout, unspecified site: Secondary | ICD-10-CM

## 2021-09-13 MED ORDER — ALLOPURINOL 300 MG PO TABS: 300.0000 mg | ORAL_TABLET | Freq: Every day | ORAL | 0 refills | Status: DC

## 2021-09-13 NOTE — Telephone Encounter (Signed)
Received request from pharmacy for refill on allopurinol (ZYLOPRIM) 300 MG tablet.

## 2021-09-13 NOTE — Telephone Encounter (Signed)
Previous Dr. Charlotta Newton patient. Last seen 04/08/21 and has appointment 10/09/21

## 2021-10-09 ENCOUNTER — Encounter: Payer: Self-pay | Admitting: Physician Assistant

## 2021-10-09 ENCOUNTER — Ambulatory Visit (INDEPENDENT_AMBULATORY_CARE_PROVIDER_SITE_OTHER): Payer: Medicare Other | Admitting: Physician Assistant

## 2021-10-09 VITALS — BP 119/75 | HR 42 | Temp 98.1°F | Ht 69.02 in | Wt 207.0 lb

## 2021-10-09 DIAGNOSIS — I10 Essential (primary) hypertension: Secondary | ICD-10-CM | POA: Diagnosis not present

## 2021-10-09 DIAGNOSIS — I700 Atherosclerosis of aorta: Secondary | ICD-10-CM

## 2021-10-09 DIAGNOSIS — Z23 Encounter for immunization: Secondary | ICD-10-CM | POA: Diagnosis not present

## 2021-10-09 DIAGNOSIS — I25118 Atherosclerotic heart disease of native coronary artery with other forms of angina pectoris: Secondary | ICD-10-CM

## 2021-10-09 DIAGNOSIS — Z131 Encounter for screening for diabetes mellitus: Secondary | ICD-10-CM

## 2021-10-09 DIAGNOSIS — S60519A Abrasion of unspecified hand, initial encounter: Secondary | ICD-10-CM | POA: Diagnosis not present

## 2021-10-09 DIAGNOSIS — G4700 Insomnia, unspecified: Secondary | ICD-10-CM

## 2021-10-09 DIAGNOSIS — E782 Mixed hyperlipidemia: Secondary | ICD-10-CM

## 2021-10-09 MED ORDER — AMITRIPTYLINE HCL 10 MG PO TABS: 10.0000 mg | ORAL_TABLET | Freq: Every day | ORAL | 0 refills | Status: DC

## 2021-10-09 MED ORDER — NITROGLYCERIN 0.4 MG SL SUBL: 0.4000 mg | SUBLINGUAL_TABLET | SUBLINGUAL | 3 refills | Status: DC | PRN

## 2021-10-09 NOTE — Assessment & Plan Note (Signed)
Chronic, historic condition He is taking Atorvastatin 80 mg PO QD and appears to be tolerating well Continue current regimen  Recheck lipid panel today - results to dictate further management Follow up in 6 months for monitoring

## 2021-10-09 NOTE — Assessment & Plan Note (Signed)
Chronic, historic condition Reports moderate relief with Amitriptyline 10 mg PO QHS and Gabapentin 100 mg PO QHS  He appears to be tolerating well, continue regimen  Follow up in 6 months for monitoring

## 2021-10-09 NOTE — Assessment & Plan Note (Signed)
Chronic, historic condition Appears stable at this time  Reports infrequent use of Nitroglycerin - only with severe exertion - chest pain resolves with rest and single tab of Nitroglycerin He is taking atorvastatin 80 mg po QD as directed Denies concerns at this time.  Will recheck Lipid panel today Continue regular apts with Cardiology Follow up in 6 months for monitoring

## 2021-10-09 NOTE — Progress Notes (Signed)
Established Patient Office Visit  Name: Thomas Miller   MRN: [id number]    DOB: [birth year]   Date:10/09/2021  Today's Provider: Talitha Givens, MHS, PA-C Introduced myself to the patient as a PA-C and provided education on APPs in clinical practice.         Subjective  Chief Complaint  Chief Complaint  Patient presents with   Coronary Artery Disease    Coronary Artery Disease Pertinent negatives include no chest pain, dizziness, leg swelling or palpitations.    HYPERTENSION / HYPERLIPIDEMIA Satisfied with current treatment? yes Duration of hypertension: years BP monitoring frequency: a few times a week BP range: 130/70-75 BP medication side effects: no Past BP meds: losartan (cozaar) Duration of hyperlipidemia: years Cholesterol medication side effects: no Cholesterol supplements: none Past cholesterol medications: atorvastain (lipitor) Medication compliance: excellent compliance Aspirin: yes Recent stressors: no Recurrent headaches: no Visual changes: no Palpitations: no Dyspnea: no Chest pain: yes- has had to use Nitroglycerin a few times this year- most recent was in Aug after playing golf for 4 days in a row and walking a lot in heat  Lower extremity edema: no Dizzy/lightheaded: no  Diet: Overall normal diet, does not eat fried foods much.  Exercise:Reports he is walking daily- avg of 6500 steps per day. He plays golf twice per week- sometimes walks the course sometimes rides     Patient Active Problem List   Diagnosis Date Noted   Body mass index (BMI) 30.0-30.9, adult 12/11/2020   Right leg numbness 12/03/2020   Pain of right lower extremity 12/03/2020   Chronic low back pain without sciatica 11/21/2020   Hearing loss    Kidney stones    Aortic atherosclerosis ([place]) 03/24/2020   History of 2019 novel coronavirus disease (COVID-19) 03/24/2020   History of stroke 10/05/2019   S/P epidural steroid injection 05/2018   PAF (paroxysmal atrial  fibrillation) ([place]) 08/07/2017   CAD (coronary artery disease) 08/07/2017   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   Advanced care planning/counseling discussion 11/27/2016   Insomnia 05/19/2016   Essential (primary) hypertension 10/25/2014   Gout 10/25/2014   Reflux 10/25/2014   Hyperlipemia 10/25/2014   BPH (benign prostatic hyperplasia) 10/25/2014   GERD (gastroesophageal reflux disease) 10/25/2014    Past Surgical History:  Procedure Laterality Date   BACK SURGERY     CORONARY ARTERY BYPASS GRAFT N/A 07/20/2017   Procedure: CORONARY ARTERY BYPASS GRAFTING (CABG) times four using left internal mammary artery to LAD and endoscopically harvested right saphenous vein graft to distal circumflex, intermediate, and PD;  Surgeon: Grace Isaac, MD;  Location: [facility];  Service: Open Heart Surgery;  Laterality: N/A;   EPIDURAL BLOCK INJECTION  05/2018   L3injections    EYE SURGERY Bilateral 2013   FOOT SURGERY Bilateral    ingrown toenails   GALLBLADDER SURGERY     LEFT HEART CATH AND CORONARY ANGIOGRAPHY N/A 07/17/2017   Procedure: LEFT HEART CATH AND CORONARY ANGIOGRAPHY;  Surgeon: Nelva Bush, MD;  Location: [facility];  Service: Cardiovascular;  Laterality: N/A;   SHOULDER SURGERY Right    SPINE SURGERY      Family History  Problem Relation Age of Onset   Diabetes Mother    Lung cancer Father    Stroke Paternal Grandfather     Social History   Tobacco Use   Smoking status: Never   Smokeless tobacco: Never  Substance Use Topics   Alcohol use: Not Currently  Current Outpatient Medications:    allopurinol (ZYLOPRIM) 300 MG tablet, Take 1 tablet (300 mg total) by mouth daily., Disp: 90 tablet, Rfl: 0   [place] Cider Vinegar 600 MG CAPS, Take 600 mg by mouth daily., Disp: , Rfl:    aspirin EC 81 MG tablet, Take 2 tablets (162 mg total) by mouth daily., Disp: 90 tablet, Rfl: 3   atorvastatin (LIPITOR) 80 MG tablet, Take 1 tablet (80 mg total) by mouth daily at 6 PM., Disp:  90 tablet, Rfl: 1   esomeprazole (NEXIUM) 40 MG capsule, Take 1 capsule (40 mg total) by mouth daily before breakfast., Disp: 90 capsule, Rfl: 2   gabapentin (NEURONTIN) 100 MG capsule, Take 1 capsule (100 mg total) by mouth at bedtime., Disp: 90 capsule, Rfl: 2   Liniments (DEEP BLUE RELIEF) GEL, Apply 1 application topically as needed for pain (joints). In joints, Disp: , Rfl:    losartan (COZAAR) 25 MG tablet, Take 1 tablet (25 mg total) by mouth daily., Disp: 90 tablet, Rfl: 1   metoprolol tartrate (LOPRESSOR) 25 MG tablet, Take 1 tablet (25 mg total) by mouth 2 (two) times daily., Disp: 180 tablet, Rfl: 1   VITAMIN D, CHOLECALCIFEROL, PO, Take 2,000 mg by mouth daily., Disp: , Rfl:    amitriptyline (ELAVIL) 10 MG tablet, Take 1 tablet (10 mg total) by mouth at bedtime., Disp: 90 tablet, Rfl: 0   nitroGLYCERIN (NITROSTAT) 0.4 MG SL tablet, Place 1 tablet (0.4 mg total) under the tongue every 5 (five) minutes as needed for chest pain., Disp: 25 tablet, Rfl: 3  Allergies  Allergen Reactions   Doxycycline Rash   Ace Inhibitors Cough    "cough"   Codeine Other (See Comments)    Insomnia   Naprosyn [Naproxen] Rash    I personally reviewed active problem list, medication list, allergies, health maintenance, notes from last encounter, lab results with the patient/caregiver today.   Review of Systems  Eyes:  Negative for blurred vision and double vision.  Cardiovascular:  Negative for chest pain, palpitations and leg swelling.  Musculoskeletal:  Negative for myalgias.  Neurological:  Negative for dizziness, tingling, tremors and headaches.  Psychiatric/Behavioral:  Negative for depression and memory loss. The patient is not nervous/anxious and does not have insomnia.       Objective  Vitals:   10/09/21 0846  BP: 119/75  Pulse: (!) 42  Temp: 98.1 F (36.7 C)  TempSrc: Oral  SpO2: 97%  Weight: 207 lb (93.9 kg)  Height: 5' 9.02" (1.753 m)    Body mass index is 30.55  kg/m.  Physical Exam Vitals reviewed.  Constitutional:      General: He is awake.     Appearance: Normal appearance. He is well-developed, well-groomed and overweight.  HENT:     Head: Normocephalic and atraumatic.     Mouth/Throat:     Lips: Pink.  Eyes:     General: Lids are normal. Gaze aligned appropriately.  Cardiovascular:     Rate and Rhythm: Normal rate and regular rhythm.     Pulses: Normal pulses.          Radial pulses are 2+ on the right side and 2+ on the left side.     Heart sounds: Normal heart sounds.  Pulmonary:     Effort: Pulmonary effort is normal.     Breath sounds: Normal breath sounds. No decreased air movement. No decreased breath sounds, wheezing, rhonchi or rales.  Musculoskeletal:     Cervical back: Normal  range of motion.     Right lower leg: No edema.     Left lower leg: No edema.  Neurological:     General: No focal deficit present.     Mental Status: He is alert and oriented to person, place, and time.     GCS: GCS eye subscore is 4. GCS verbal subscore is 5. GCS motor subscore is 6.     Cranial Nerves: No dysarthria or facial asymmetry.     Coordination: Coordination is intact.     Gait: Gait is intact.  Psychiatric:        Attention and Perception: Attention and perception normal.        Mood and Affect: Mood and affect normal.        Speech: Speech normal.        Behavior: Behavior normal. Behavior is cooperative.        Cognition and Memory: Cognition normal.      No results found for this or any previous visit (from the past 2160 hour(s)).   PHQ2/9:    04/08/2021    9:27 AM 01/07/2021    9:45 AM 11/21/2020    2:07 PM 11/08/2020    8:36 AM 11/05/2020    8:19 AM  Depression screen PHQ 2/9  Decreased Interest 0 0 0 0 0  Down, Depressed, Hopeless 0 0 0 0 0  PHQ - 2 Score 0 0 0 0 0  Altered sleeping 0 3 1 1    Tired, decreased energy 0 0 0 0   Change in appetite 0 0 0 0   Feeling bad or failure about yourself  0 0 0 0    Trouble concentrating 0 0 0 0   Moving slowly or fidgety/restless 0 0 0 0   Suicidal thoughts 0 0 0 0   PHQ-9 Score 0 3 1 1    Difficult doing work/chores Not difficult at all Not difficult at all Not difficult at all        Fall Risk:    04/08/2021    9:27 AM 01/07/2021    9:39 AM 11/21/2020    2:07 PM 11/08/2020    8:34 AM 11/05/2020    8:19 AM  Fall Risk   Falls in the past year? 0 0 0 0 0  Number falls in past yr: 0 0 0 0   Injury with Fall? 0 0 0 0   Risk for fall due to : No Fall Risks No Fall Risks No Fall Risks No Fall Risks Medication side effect  Follow up Falls evaluation completed Falls evaluation completed Falls evaluation completed Falls evaluation completed Falls evaluation completed;Education provided;Falls prevention discussed      Functional Status Survey:      Assessment & Plan  Problem List Items Addressed This Visit       Cardiovascular and Mediastinum   Essential (primary) hypertension    Chronic, historic condition Appears well controlled on current regimen comprised of Losartan 25 mg PO QD and Metoprolol 25 mg PO BID  Continue current regimen and continue collaboration with Cardiology  Recommend he continue to exercise and stay active with regular physical activity  Follow up in 6 months for monitoring       Relevant Medications   nitroGLYCERIN (NITROSTAT) 0.4 MG SL tablet   Other Relevant Orders   Comp Met (CMET)   CBC w/Diff   CAD (coronary artery disease)    Chronic, historic condition Appears stable at this time  Reports infrequent use  of Nitroglycerin - only with severe exertion - chest pain resolves with rest and single tab of Nitroglycerin He is taking atorvastatin 80 mg po QD as directed Denies concerns at this time.  Will recheck Lipid panel today Continue regular apts with Cardiology Follow up in 6 months for monitoring       Relevant Medications   nitroGLYCERIN (NITROSTAT) 0.4 MG SL tablet   Other Relevant Orders    Lipid panel   Aortic atherosclerosis (HCC)    Chronic, historic condition He is taking Atorvastatin 80 mg PO QD for stabilization  Continue current regimen and physical activity  Follow up in 6 months for monitoring       Relevant Medications   nitroGLYCERIN (NITROSTAT) 0.4 MG SL tablet   Other Relevant Orders   Lipid panel     Other   Hyperlipemia    Chronic, historic condition He is taking Atorvastatin 80 mg PO QD and appears to be tolerating well Continue current regimen  Recheck lipid panel today - results to dictate further management Follow up in 6 months for monitoring       Relevant Medications   nitroGLYCERIN (NITROSTAT) 0.4 MG SL tablet   Other Relevant Orders   Lipid panel   Insomnia    Chronic, historic condition Reports moderate relief with Amitriptyline 10 mg PO QHS and Gabapentin 100 mg PO QHS  He appears to be tolerating well, continue regimen  Follow up in 6 months for monitoring       Relevant Medications   amitriptyline (ELAVIL) 10 MG tablet   Other Visit Diagnoses     Scratch of hand, unspecified laterality, initial encounter    -  Primary   Relevant Orders   Tdap vaccine greater than or equal to 7yo IM (Completed)   Screening for diabetes mellitus (DM)       Relevant Orders   HgB A1c        Return in about 6 months (around 04/09/2022) for HTN, HLD, coronary artery disease .   I, Latonya Nelon E Zarai Orsborn, PA-C, have reviewed all documentation for this visit. The documentation on 10/09/21 for the exam, diagnosis, procedures, and orders are all accurate and complete.   Talitha Givens, MHS, PA-C [facility]

## 2021-10-09 NOTE — Assessment & Plan Note (Signed)
Chronic, historic condition He is taking Atorvastatin 80 mg PO QD for stabilization  Continue current regimen and physical activity  Follow up in 6 months for monitoring

## 2021-10-09 NOTE — Assessment & Plan Note (Signed)
Chronic, historic condition Appears well controlled on current regimen comprised of Losartan 25 mg PO QD and Metoprolol 25 mg PO BID  Continue current regimen and continue collaboration with Cardiology  Recommend he continue to exercise and stay active with regular physical activity  Follow up in 6 months for monitoring

## 2021-10-10 LAB — COMPREHENSIVE METABOLIC PANEL
ALT: 27 IU/L (ref 0–44)
AST: 20 IU/L (ref 0–40)
Albumin/Globulin Ratio: 2.1 (ref 1.2–2.2)
Albumin: 5 g/dL — ABNORMAL HIGH (ref 3.8–4.8)
Alkaline Phosphatase: 102 IU/L (ref 44–121)
BUN/Creatinine Ratio: 13 (ref 10–24)
BUN: 15 mg/dL (ref 8–27)
Bilirubin Total: 0.9 mg/dL (ref 0.0–1.2)
CO2: 24 mmol/L (ref 20–29)
Calcium: 9.8 mg/dL (ref 8.6–10.2)
Chloride: 100 mmol/L (ref 96–106)
Creatinine, Ser: 1.16 mg/dL (ref 0.76–1.27)
Globulin, Total: 2.4 g/dL (ref 1.5–4.5)
Glucose: 95 mg/dL (ref 70–99)
Potassium: 4.7 mmol/L (ref 3.5–5.2)
Sodium: 138 mmol/L (ref 134–144)
Total Protein: 7.4 g/dL (ref 6.0–8.5)
eGFR: 67 mL/min/{1.73_m2} (ref >59)

## 2021-10-10 LAB — CBC WITH DIFFERENTIAL/PLATELET
Basophils Absolute: 0.1 10*3/uL (ref 0.0–0.2)
Basos: 1 %
EOS (ABSOLUTE): 0.2 10*3/uL (ref 0.0–0.4)
Eos: 2 %
Hematocrit: 49.5 % (ref 37.5–51.0)
Hemoglobin: 16.4 g/dL (ref 13.0–17.7)
Immature Grans (Abs): 0 10*3/uL (ref 0.0–0.1)
Immature Granulocytes: 0 %
Lymphocytes Absolute: 2.7 10*3/uL (ref 0.7–3.1)
Lymphs: 33 %
MCH: 29.2 pg (ref 26.6–33.0)
MCHC: 33.1 g/dL (ref 31.5–35.7)
MCV: 88 fL (ref 79–97)
Monocytes Absolute: 0.8 10*3/uL (ref 0.1–0.9)
Monocytes: 9 %
Neutrophils Absolute: 4.4 10*3/uL (ref 1.4–7.0)
Neutrophils: 55 %
Platelets: 198 10*3/uL (ref 150–450)
RBC: 5.61 x10E6/uL (ref 4.14–5.80)
RDW: 13.7 % (ref 11.6–15.4)
WBC: 8.1 10*3/uL (ref 3.4–10.8)

## 2021-10-10 LAB — HEMOGLOBIN A1C
Est. average glucose Bld gHb Est-mCnc: 126 mg/dL
Hgb A1c MFr Bld: 6 % — ABNORMAL HIGH (ref 4.8–5.6)

## 2021-10-11 LAB — LIPID PANEL
Chol/HDL Ratio: 3.3 ratio (ref 0.0–5.0)
Cholesterol, Total: 142 mg/dL (ref 100–199)
HDL: 43 mg/dL (ref >39)
LDL Chol Calc (NIH): 73 mg/dL (ref 0–99)
Triglycerides: 151 mg/dL — ABNORMAL HIGH (ref 0–149)
VLDL Cholesterol Cal: 26 mg/dL (ref 5–40)

## 2021-10-11 LAB — SPECIMEN STATUS REPORT

## 2021-11-06 ENCOUNTER — Ambulatory Visit (INDEPENDENT_AMBULATORY_CARE_PROVIDER_SITE_OTHER): Payer: Medicare Other | Admitting: *Deleted

## 2021-11-06 DIAGNOSIS — Z0000 Encounter for general adult medical examination without abnormal findings: Secondary | ICD-10-CM | POA: Diagnosis not present

## 2021-11-06 NOTE — Patient Instructions (Signed)
Mr. Thomas Miller , Thank you for taking time to come for your Medicare Wellness Visit. I appreciate your ongoing commitment to your health goals. Please review the following plan we discussed and let me know if I can assist you in the future.   These are the goals we discussed:  Goals      Patient Stated     No goals        This is a list of the screening recommended for you and due dates:  Health Maintenance  Topic Date Due   COVID-19 Vaccine (4 - Moderna series) 11/22/2021*   Zoster (Shingles) Vaccine (1 of 2) 01/08/2022*   Flu Shot  04/27/2022*   Cologuard (Stool DNA test)  05/04/2022   Tetanus Vaccine  10/10/2031   Hepatitis C Screening: USPSTF Recommendation to screen - Ages 18-79 yo.  Completed   HPV Vaccine  Aged Out   Pneumonia Vaccine  Discontinued  *Topic was postponed. The date shown is not the original due date.    Advanced directives: yes   Conditions/risks identified:   Next appointment: Follow up in one year for your annual wellness visit.   04-08-2021 @ 8:20  Preventive Care 65 Years and Older, Male  Preventive care refers to lifestyle choices and visits with your health care provider that can promote health and wellness. What does preventive care include? A yearly physical exam. This is also called an annual well check. Dental exams once or twice a year. Routine eye exams. Ask your health care provider how often you should have your eyes checked. Personal lifestyle choices, including: Daily care of your teeth and gums. Regular physical activity. Eating a healthy diet. Avoiding tobacco and drug use. Limiting alcohol use. Practicing safe sex. Taking low doses of aspirin every day. Taking vitamin and mineral supplements as recommended by your health care provider. What happens during an annual well check? The services and screenings done by your health care provider during your annual well check will depend on your age, overall health, lifestyle risk factors,  and family history of disease. Counseling  Your health care provider may ask you questions about your: Alcohol use. Tobacco use. Drug use. Emotional well-being. Home and relationship well-being. Sexual activity. Eating habits. History of falls. Memory and ability to understand (cognition). Work and work Statistician. Screening  You may have the following tests or measurements: Height, weight, and BMI. Blood pressure. Lipid and cholesterol levels. These may be checked every 5 years, or more frequently if you are over 80 years old. Skin check. Lung cancer screening. You may have this screening every year starting at age 72 if you have a 30-pack-year history of smoking and currently smoke or have quit within the past 15 years. Fecal occult blood test (FOBT) of the stool. You may have this test every year starting at age 72. Flexible sigmoidoscopy or colonoscopy. You may have a sigmoidoscopy every 5 years or a colonoscopy every 10 years starting at age 72. Prostate cancer screening. Recommendations will vary depending on your family history and other risks. Hepatitis C blood test. Hepatitis B blood test. Sexually transmitted disease (STD) testing. Diabetes screening. This is done by checking your blood sugar (glucose) after you have not eaten for a while (fasting). You may have this done every 1-3 years. Abdominal aortic aneurysm (AAA) screening. You may need this if you are a current or former smoker. Osteoporosis. You may be screened starting at age 72 if you are at high risk. Talk with your health  care provider about your test results, treatment options, and if necessary, the need for more tests. Vaccines  Your health care provider may recommend certain vaccines, such as: Influenza vaccine. This is recommended every year. Tetanus, diphtheria, and acellular pertussis (Tdap, Td) vaccine. You may need a Td booster every 10 years. Zoster vaccine. You may need this after age  72. Pneumococcal 13-valent conjugate (PCV13) vaccine. One dose is recommended after age 72. Pneumococcal polysaccharide (PPSV23) vaccine. One dose is recommended after age 72. Talk to your health care provider about which screenings and vaccines you need and how often you need them. This information is not intended to replace advice given to you by your health care provider. Make sure you discuss any questions you have with your health care provider. Document Released: 02/09/2015 Document Revised: 10/03/2015 Document Reviewed: 11/14/2014 Elsevier Interactive Patient Education  2017 [employer] Prevention in the Home Falls can cause injuries. They can happen to people of all ages. There are many things you can do to make your home safe and to help prevent falls. What can I do on the outside of my home? Regularly fix the edges of walkways and driveways and fix any cracks. Remove anything that might make you trip as you walk through a door, such as a raised step or threshold. Trim any bushes or trees on the path to your home. Use bright outdoor lighting. Clear any walking paths of anything that might make someone trip, such as rocks or tools. Regularly check to see if handrails are loose or broken. Make sure that both sides of any steps have handrails. Any raised decks and porches should have guardrails on the edges. Have any leaves, snow, or ice cleared regularly. Use sand or salt on walking paths during winter. Clean up any spills in your garage right away. This includes oil or grease spills. What can I do in the bathroom? Use night lights. Install grab bars by the toilet and in the tub and shower. Do not use towel bars as grab bars. Use non-skid mats or decals in the tub or shower. If you need to sit down in the shower, use a plastic, non-slip stool. Keep the floor dry. Clean up any water that spills on the floor as soon as it happens. Remove soap buildup in the tub or shower  regularly. Attach bath mats securely with double-sided non-slip rug tape. Do not have throw rugs and other things on the floor that can make you trip. What can I do in the bedroom? Use night lights. Make sure that you have a light by your bed that is easy to reach. Do not use any sheets or blankets that are too big for your bed. They should not hang down onto the floor. Have a firm chair that has side arms. You can use this for support while you get dressed. Do not have throw rugs and other things on the floor that can make you trip. What can I do in the kitchen? Clean up any spills right away. Avoid walking on wet floors. Keep items that you use a lot in easy-to-reach places. If you need to reach something above you, use a strong step stool that has a grab bar. Keep electrical cords out of the way. Do not use floor polish or wax that makes floors slippery. If you must use wax, use non-skid floor wax. Do not have throw rugs and other things on the floor that can make you trip. What can  I do with my stairs? Do not leave any items on the stairs. Make sure that there are handrails on both sides of the stairs and use them. Fix handrails that are broken or loose. Make sure that handrails are as long as the stairways. Check any carpeting to make sure that it is firmly attached to the stairs. Fix any carpet that is loose or worn. Avoid having throw rugs at the top or bottom of the stairs. If you do have throw rugs, attach them to the floor with carpet tape. Make sure that you have a light switch at the top of the stairs and the bottom of the stairs. If you do not have them, ask someone to add them for you. What else can I do to help prevent falls? Wear shoes that: Do not have high heels. Have rubber bottoms. Are comfortable and fit you well. Are closed at the toe. Do not wear sandals. If you use a stepladder: Make sure that it is fully opened. Do not climb a closed stepladder. Make sure that  both sides of the stepladder are locked into place. Ask someone to hold it for you, if possible. Clearly mark and make sure that you can see: Any grab bars or handrails. First and last steps. Where the edge of each step is. Use tools that help you move around (mobility aids) if they are needed. These include: Canes. Walkers. Scooters. Crutches. Turn on the lights when you go into a dark area. Replace any light bulbs as soon as they burn out. Set up your furniture so you have a clear path. Avoid moving your furniture around. If any of your floors are uneven, fix them. If there are any pets around you, be aware of where they are. Review your medicines with your doctor. Some medicines can make you feel dizzy. This can increase your chance of falling. Ask your doctor what other things that you can do to help prevent falls. This information is not intended to replace advice given to you by your health care provider. Make sure you discuss any questions you have with your health care provider. Document Released: 11/09/2008 Document Revised: 06/21/2015 Document Reviewed: 02/17/2014 Elsevier Interactive Patient Education  2017 [employer].

## 2021-11-06 NOTE — Progress Notes (Signed)
Subjective:   Thomas Miller is a 71 y.o. male who presents for Medicare Annual/Subsequent preventive examination.  I connected with  Thomas Miller on 11/06/21 by a telephone  enabled telemedicine application and verified that I am speaking with the correct person using two identifiers.   I discussed the limitations of evaluation and management by telemedicine. The patient expressed understanding and agreed to proceed.  Patient location: home  Provider location:   Tele-health-home    Review of Systems     Cardiac Risk Factors include: advanced age (>18men, >79 women);male gender;obesity (BMI >30kg/m2);hypertension     Objective:    Today's Vitals   11/06/21 0833  PainSc: 3    There is no height or weight on file to calculate BMI.     11/26/2020    2:38 PM 11/05/2020    8:19 AM 11/04/2019    8:18 AM 08/28/2019    8:28 AM 11/01/2018    8:15 AM 01/02/2018   11:17 AM 10/28/2017    8:18 AM  Advanced Directives  Does Patient Have a Medical Advance Directive? Yes Yes Yes No Yes No Yes  Type of Corporate treasurer of [place];Living will [place];Living will  Living will;Healthcare Power of Attorney  Living will;Healthcare Power of Attorney  Does patient want to make changes to medical advance directive? No - Patient declined        Copy of [place] in Chart?  No - copy requested No - copy requested  No - copy requested  No - copy requested  Would patient like information on creating a medical advance directive?      No - Patient declined     Current Medications (verified) Outpatient Encounter Medications as of 11/06/2021  Medication Sig   allopurinol (ZYLOPRIM) 300 MG tablet Take 1 tablet (300 mg total) by mouth daily.   amitriptyline (ELAVIL) 10 MG tablet Take 1 tablet (10 mg total) by mouth at bedtime.   [place] Cider Vinegar 600 MG CAPS Take 600 mg by mouth daily.   aspirin EC 81 MG tablet Take 2 tablets (162 mg total) by  mouth daily.   atorvastatin (LIPITOR) 80 MG tablet Take 1 tablet (80 mg total) by mouth daily at 6 PM.   esomeprazole (NEXIUM) 40 MG capsule Take 1 capsule (40 mg total) by mouth daily before breakfast.   gabapentin (NEURONTIN) 100 MG capsule Take 1 capsule (100 mg total) by mouth at bedtime.   Liniments (DEEP BLUE RELIEF) GEL Apply 1 application topically as needed for pain (joints). In joints   losartan (COZAAR) 25 MG tablet Take 1 tablet (25 mg total) by mouth daily.   metoprolol tartrate (LOPRESSOR) 25 MG tablet Take 1 tablet (25 mg total) by mouth 2 (two) times daily.   nitroGLYCERIN (NITROSTAT) 0.4 MG SL tablet Place 1 tablet (0.4 mg total) under the tongue every 5 (five) minutes as needed for chest pain.   VITAMIN D, CHOLECALCIFEROL, PO Take 2,000 mg by mouth daily.   No facility-administered encounter medications on file as of 11/06/2021.    Allergies (verified) Doxycycline, Ace inhibitors, Codeine, and Naprosyn [naproxen]   History: Past Medical History:  Diagnosis Date   Advanced care planning/counseling discussion 11/27/2016   Aortic atherosclerosis ([place]) 03/24/2020   Noted on CT 03/01/19   Body mass index (BMI) 30.0-30.9, adult 12/11/2020   BPH (benign prostatic hyperplasia) 10/25/2014   CAD (coronary artery disease) 08/07/2017   Chronic low back pain without sciatica  11/21/2020   Essential (primary) hypertension 10/25/2014   GERD (gastroesophageal reflux disease) 10/25/2014   Gout    Hearing loss    History of 2019 novel coronavirus disease (COVID-19) 03/24/2020   On 02/09/20   History of stroke 10/05/2019   Hyperlipemia 10/25/2014   Hypertension    Insomnia 05/19/2016   Kidney stones    PAF (paroxysmal atrial fibrillation) ([place]) 08/07/2017   Pain of right lower extremity 12/03/2020   Reflux 10/25/2014   Right leg numbness 12/03/2020   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   S/P epidural steroid injection 05/2018   Past Surgical History:  Procedure Laterality Date   BACK SURGERY      CORONARY ARTERY BYPASS GRAFT N/A 07/20/2017   Procedure: CORONARY ARTERY BYPASS GRAFTING (CABG) times four using left internal mammary artery to LAD and endoscopically harvested right saphenous vein graft to distal circumflex, intermediate, and PD;  Surgeon: Grace Isaac, MD;  Location: [facility];  Service: Open Heart Surgery;  Laterality: N/A;   EPIDURAL BLOCK INJECTION  05/2018   L3injections    EYE SURGERY Bilateral 2013   FOOT SURGERY Bilateral    ingrown toenails   GALLBLADDER SURGERY     LEFT HEART CATH AND CORONARY ANGIOGRAPHY N/A 07/17/2017   Procedure: LEFT HEART CATH AND CORONARY ANGIOGRAPHY;  Surgeon: Nelva Bush, MD;  Location: [facility];  Service: Cardiovascular;  Laterality: N/A;   SHOULDER SURGERY Right    SPINE SURGERY     Family History  Problem Relation Age of Onset   Diabetes Mother    Lung cancer Father    Stroke Paternal Grandfather    Social History   Socioeconomic History   Marital status: Married    Spouse name: Not on file   Number of children: Not on file   Years of education: Not on file   Highest education level: Associate degree: occupational, Hotel manager, or vocational program  Occupational History   Occupation: retired  Tobacco Use   Smoking status: Never   Smokeless tobacco: Never  Vaping Use   Vaping Use: Never used  Substance and Sexual Activity   Alcohol use: Not Currently   Drug use: No   Sexual activity: Yes  Other Topics Concern   Not on file  Social History Narrative   Volunteers and golfs    Social Determinants of Health   Financial Resource Strain: Low Risk  (11/05/2020)   Overall Financial Resource Strain (CARDIA)    Difficulty of Paying Living Expenses: Not hard at all  Food Insecurity: No Food Insecurity (11/06/2021)   Hunger Vital Sign    Worried About Running Out of Food in the Last Year: Never true    [place] in the Last Year: Never true  Transportation Needs: No Transportation Needs  (11/06/2021)   PRAPARE - Hydrologist (Medical): No    Lack of Transportation (Non-Medical): No  Physical Activity: Sufficiently Active (11/05/2020)   Exercise Vital Sign    Days of Exercise per Week: 7 days    Minutes of Exercise per Session: 60 min  Stress: No Stress Concern Present (11/05/2020)   [place]    Feeling of Stress : Not at all  Social Connections: [place] (11/06/2021)   Social Connection and Isolation Panel [NHANES]    Frequency of Communication with Friends and Family: More than three times a week    Frequency of Social Gatherings with Friends and  Family: More than three times a week    Attends Religious Services: More than 4 times per year    Active Member of Clubs or Organizations: Yes    Attends Music therapist: More than 4 times per year    Marital Status: Married    Tobacco Counseling Counseling given: Not Answered   Clinical Intake:  Pre-visit preparation completed: Yes  Pain : 0-10 Pain Score: 3  Pain Type: Chronic pain Pain Location: Leg Pain Orientation: Right Pain Descriptors / Indicators: Burning, Aching, Discomfort Pain Onset: More than a month ago Pain Relieving Factors: tylenol Effect of Pain on Daily Activities: yes  Pain Relieving Factors: tylenol  Diabetes: No  How often do you need to have someone help you when you read instructions, pamphlets, or other written materials from your doctor or pharmacy?: 1 - Never  Diabetic?  no  Interpreter Needed?: No  Information entered by :: Leroy Kennedy LPN   Activities of Daily Living    11/06/2021    8:40 AM  In your present state of health, do you have any difficulty performing the following activities:  Hearing? 1  Vision? 0  Difficulty concentrating or making decisions? 0  Walking or climbing stairs? 0  Dressing or bathing? 0  Doing errands, shopping? 0   Preparing Food and eating ? N  Using the Toilet? N  In the past six months, have you accidently leaked urine? N  Do you have problems with loss of bowel control? N  Managing your Medications? N  Managing your Finances? N  Housekeeping or managing your Housekeeping? N    Patient Care Team: Charlynne Cousins, MD (Inactive) as PCP - General (Internal Medicine) Revankar, Reita Cliche, MD as PCP - Cardiology (Cardiology) Eustace Moore, MD as Consulting Physician (Neurosurgery) Grace Isaac, MD (Inactive) as Consulting Physician (Cardiothoracic Surgery)  Indicate any recent Medical Services you may have received from other than [facility] providers in the past year (date may be approximate).     Assessment:   This is a routine wellness examination for Hrishikesh.  Hearing/Vision screen Hearing Screening - Comments:: Does not wear hearing aids Has some mild hearing loss  Vision Screening - Comments:: Up to date [facility]  Dietary issues and exercise activities discussed: Current Exercise Habits: The patient does not participate in regular exercise at present   Goals Addressed             This Visit's Progress    Patient Stated       No goals       Depression Screen    11/06/2021    8:39 AM 04/08/2021    9:27 AM 01/07/2021    9:45 AM 11/21/2020    2:07 PM 11/08/2020    8:36 AM 11/05/2020    8:19 AM 10/24/2020    1:58 PM  PHQ 2/9 Scores  PHQ - 2 Score 0 0 0 0 0 0 0  PHQ- 9 Score 2 0 3 1 1   0    Fall Risk    11/06/2021    8:35 AM 04/08/2021    9:27 AM 01/07/2021    9:39 AM 11/21/2020    2:07 PM 11/08/2020    8:34 AM  Fall Risk   Falls in the past year? 0 0 0 0 0  Number falls in past yr: 0 0 0 0 0  Injury with Fall? 0 0 0 0 0  Risk for fall due to :  No Fall Risks No  Fall Risks No Fall Risks No Fall Risks  Follow up Falls evaluation completed;Education provided;Falls prevention discussed Falls evaluation completed Falls evaluation completed Falls evaluation completed  Falls evaluation completed    FALL RISK PREVENTION PERTAINING TO THE HOME:  Any stairs in or around the home? Yes  If so, are there any without handrails? No  Home free of loose throw rugs in walkways, pet beds, electrical cords, etc? Yes  Adequate lighting in your home to reduce risk of falls? Yes   ASSISTIVE DEVICES UTILIZED TO PREVENT FALLS:  Life alert? No  Use of a cane, walker or w/c? No  Grab bars in the bathroom? Yes  Shower chair or bench in shower? Yes  Elevated toilet seat or a handicapped toilet? Yes   TIMED UP AND GO:  Was the test performed? No .    Cognitive Function:        11/06/2021    8:36 AM 11/05/2020    8:21 AM 11/04/2019    8:20 AM 11/01/2018    8:17 AM 10/28/2017    8:19 AM  6CIT Screen  What Year? 0 points 0 points 0 points 0 points 0 points  What month? 0 points 0 points 0 points 0 points 0 points  What time? 0 points 0 points 0 points 0 points 0 points  Count back from 20 0 points 0 points 0 points 0 points 0 points  Months in reverse 0 points 0 points 0 points 0 points 0 points  Repeat phrase 0 points 2 points 0 points 0 points 0 points  Total Score 0 points 2 points 0 points 0 points 0 points    Immunizations Immunization History  Administered Date(s) Administered   Moderna Sars-Covid-2 Vaccination 09/07/2019, 10/07/2019, 09/11/2020   Pneumococcal Conjugate-13 11/19/2015   Td 11/10/2007   Tdap 10/09/2021   Zoster, Live 02/17/2009    TDAP status: Up to date  Flu Vaccine status: Declined, Education has been provided regarding the importance of this vaccine but patient still declined. Advised may receive this vaccine at local pharmacy or Health Dept. Aware to provide a copy of the vaccination record if obtained from local pharmacy or Health Dept. Verbalized acceptance and understanding.  Pneumococcal vaccine status: Due, Education has been provided regarding the importance of this vaccine. Advised may receive this vaccine at local  pharmacy or Health Dept. Aware to provide a copy of the vaccination record if obtained from local pharmacy or Health Dept. Verbalized acceptance and understanding.  Covid-19 vaccine status: Information provided on how to obtain vaccines.   Qualifies for Shingles Vaccine? Yes   Zostavax completed No   Shingrix Completed?: Yes  Screening Tests Health Maintenance  Topic Date Due   COVID-19 Vaccine (4 - Moderna series) 11/22/2021 (Originally 11/06/2020)   Zoster Vaccines- Shingrix (1 of 2) 01/08/2022 (Originally 09/08/1999)   INFLUENZA VACCINE  04/27/2022 (Originally 08/27/2021)   Fecal DNA (Cologuard)  05/04/2022   TETANUS/TDAP  10/10/2031   Hepatitis C Screening  Completed   HPV VACCINES  Aged Out   Pneumonia Vaccine 35+ Years old  Discontinued    Health Maintenance  There are no preventive care reminders to display for this patient.   Colorectal cancer screening: Type of screening: Cologuard. Completed 2022. Repeat every 3 years  Lung Cancer Screening: (Low Dose CT Chest recommended if Age 17-80 years, 30 pack-year currently smoking OR have quit w/in 15years.) does not qualify.   Lung Cancer Screening Referral:   Additional Screening:  Hepatitis C Screening: does not  qualify; Completed 20223  Vision Screening: Recommended annual ophthalmology exams for early detection of glaucoma and other disorders of the eye. Is the patient up to date with their annual eye exam?  Yes  Who is the provider or what is the name of the office in which the patient attends annual eye exams? [facility] If pt is not established with a provider, would they like to be referred to a provider to establish care? No .   Dental Screening: Recommended annual dental exams for proper oral hygiene  Community Resource Referral / Chronic Care Management: CRR required this visit?  No   CCM required this visit?  No      Plan:     I have personally reviewed and noted the following in the patient's chart:    Medical and social history Use of alcohol, tobacco or illicit drugs  Current medications and supplements including opioid prescriptions. Patient is not currently taking opioid prescriptions. Functional ability and status Nutritional status Physical activity Advanced directives List of other physicians Hospitalizations, surgeries, and ER visits in previous 12 months Vitals Screenings to include cognitive, depression, and falls Referrals and appointments  In addition, I have reviewed and discussed with patient certain preventive protocols, quality metrics, and best practice recommendations. A written personalized care plan for preventive services as well as general preventive health recommendations were provided to patient.     Leroy Kennedy, LPN   [id number]   Nurse Notes:

## 2021-11-07 ENCOUNTER — Ambulatory Visit: Payer: Medicare Other

## 2021-11-08 ENCOUNTER — Ambulatory Visit: Payer: Medicare Other

## 2021-11-20 DIAGNOSIS — M5430 Sciatica, unspecified side: Secondary | ICD-10-CM | POA: Insufficient documentation

## 2021-11-29 ENCOUNTER — Encounter: Payer: Self-pay | Admitting: Cardiology

## 2021-11-29 ENCOUNTER — Ambulatory Visit: Payer: Medicare Other | Attending: Cardiology | Admitting: Cardiology

## 2021-11-29 VITALS — BP 118/72 | HR 47 | Ht 69.0 in | Wt 206.2 lb

## 2021-11-29 DIAGNOSIS — I10 Essential (primary) hypertension: Secondary | ICD-10-CM | POA: Diagnosis not present

## 2021-11-29 DIAGNOSIS — I2510 Atherosclerotic heart disease of native coronary artery without angina pectoris: Secondary | ICD-10-CM | POA: Diagnosis not present

## 2021-11-29 DIAGNOSIS — Z951 Presence of aortocoronary bypass graft: Secondary | ICD-10-CM

## 2021-11-29 DIAGNOSIS — I700 Atherosclerosis of aorta: Secondary | ICD-10-CM

## 2021-11-29 DIAGNOSIS — Z8673 Personal history of transient ischemic attack (TIA), and cerebral infarction without residual deficits: Secondary | ICD-10-CM | POA: Diagnosis not present

## 2021-11-29 MED ORDER — METOPROLOL TARTRATE 25 MG PO TABS: 25.0000 mg | ORAL_TABLET | Freq: Two times a day (BID) | ORAL | 3 refills | Status: DC

## 2021-11-29 NOTE — Progress Notes (Signed)
Cardiology Office Note:    Date:  11/29/2021   ID:  Thomas Miller, DOB 1950/06/10, MRN [id number]  PCP:  Charlynne Cousins, MD (Inactive)  Cardiologist:  Jenean Lindau, MD   Referring MD: Charlynne Cousins, MD    ASSESSMENT:    1. Essential (primary) hypertension   2. Coronary artery disease involving native coronary artery of native heart without angina pectoris   3. Aortic atherosclerosis ([place])   4. S/P CABG x 4   5. History of stroke    PLAN:    In order of problems listed above:  Coronary artery disease: Secondary prevention stressed with the patient.  Importance of compliance with diet medication stressed any vocalized understanding.  He is doing well with his exercise program. Essential hypertension: Blood pressure stable and diet was emphasized.  Lifestyle modification urged. Mixed dyslipidemia: On lipid-lowering medications followed by primary care.  Lipids were reviewed and discussed with the patient. History of stroke: Stable at this time.  Medical management. Patient will be seen in follow-up appointment in 6 months or earlier if the patient has any concerns    Medication Adjustments/Labs and Tests Ordered: Current medicines are reviewed at length with the patient today.  Concerns regarding medicines are outlined above.  No orders of the defined types were placed in this encounter.  No orders of the defined types were placed in this encounter.    No chief complaint on file.    History of Present Illness:    Thomas Miller is a 71 y.o. male.  Patient has past medical history of coronary artery disease post CABG surgery, history of stroke, essential hypertension, dyslipidemia and diabetes mellitus.  He denies any problems at this time and takes care of activities of daily living.  No chest pain orthopnea or PND.  At the time of my evaluation, the patient is alert awake oriented and in no distress.  He walks at least half an hour a day on a regular basis.  Past Medical  History:  Diagnosis Date   Advanced care planning/counseling discussion 11/27/2016   Aortic atherosclerosis ([place]) 03/24/2020   Noted on CT 03/01/19   Body mass index (BMI) 30.0-30.9, adult 12/11/2020   BPH (benign prostatic hyperplasia) 10/25/2014   CAD (coronary artery disease) 08/07/2017   Chronic low back pain without sciatica 11/21/2020   Essential (primary) hypertension 10/25/2014   GERD (gastroesophageal reflux disease) 10/25/2014   Gout    Hearing loss    History of 2019 novel coronavirus disease (COVID-19) 03/24/2020   On 02/09/20   History of stroke 10/05/2019   Hyperlipemia 10/25/2014   Insomnia 05/19/2016   Kidney stones    PAF (paroxysmal atrial fibrillation) ([place]) 08/07/2017   Pain of right lower extremity 12/03/2020   Reflux 10/25/2014   Right leg numbness 12/03/2020   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   S/P epidural steroid injection 05/2018   Sciatica 11/20/2021    Past Surgical History:  Procedure Laterality Date   BACK SURGERY     CORONARY ARTERY BYPASS GRAFT N/A 07/20/2017   Procedure: CORONARY ARTERY BYPASS GRAFTING (CABG) times four using left internal mammary artery to LAD and endoscopically harvested right saphenous vein graft to distal circumflex, intermediate, and PD;  Surgeon: Grace Isaac, MD;  Location: [facility];  Service: Open Heart Surgery;  Laterality: N/A;   EPIDURAL BLOCK INJECTION  05/2018   L3injections    EYE SURGERY Bilateral 2013   FOOT SURGERY Bilateral    ingrown toenails   GALLBLADDER  SURGERY     LEFT HEART CATH AND CORONARY ANGIOGRAPHY N/A 07/17/2017   Procedure: LEFT HEART CATH AND CORONARY ANGIOGRAPHY;  Surgeon: Yvonne Kendall, MD;  Location: [facility];  Service: Cardiovascular;  Laterality: N/A;   SHOULDER SURGERY Right    SPINE SURGERY      Current Medications: Current Meds  Medication Sig   allopurinol (ZYLOPRIM) 300 MG tablet Take 1 tablet (300 mg total) by mouth daily.   amitriptyline (ELAVIL) 10 MG tablet Take 1 tablet  (10 mg total) by mouth at bedtime.   [place] Cider Vinegar 600 MG CAPS Take 600 mg by mouth daily.   aspirin EC 81 MG tablet Take 2 tablets (162 mg total) by mouth daily.   atorvastatin (LIPITOR) 80 MG tablet Take 1 tablet (80 mg total) by mouth daily at 6 PM.   esomeprazole (NEXIUM) 40 MG capsule Take 1 capsule (40 mg total) by mouth daily before breakfast.   gabapentin (NEURONTIN) 100 MG capsule Take 1 capsule (100 mg total) by mouth at bedtime.   Liniments (DEEP BLUE RELIEF) GEL Apply 1 application topically as needed for pain (joints). In joints   losartan (COZAAR) 25 MG tablet Take 1 tablet (25 mg total) by mouth daily.   metoprolol tartrate (LOPRESSOR) 25 MG tablet Take 1 tablet (25 mg total) by mouth 2 (two) times daily.   nitroGLYCERIN (NITROSTAT) 0.4 MG SL tablet Place 1 tablet (0.4 mg total) under the tongue every 5 (five) minutes as needed for chest pain.   VITAMIN D, CHOLECALCIFEROL, PO Take 2,000 mg by mouth daily.     Allergies:   Doxycycline, Ace inhibitors, Codeine, and Naprosyn [naproxen]   Social History   Socioeconomic History   Marital status: Married    Spouse name: Not on file   Number of children: Not on file   Years of education: Not on file   Highest education level: Associate degree: occupational, Scientist, product/process development, or vocational program  Occupational History   Occupation: retired  Tobacco Use   Smoking status: Never   Smokeless tobacco: Never  Vaping Use   Vaping Use: Never used  Substance and Sexual Activity   Alcohol use: Not Currently   Drug use: No   Sexual activity: Yes  Other Topics Concern   Not on file  Social History Narrative   Volunteers and golfs    Social Determinants of Health   Financial Resource Strain: Low Risk  (11/05/2020)   Overall Financial Resource Strain (CARDIA)    Difficulty of Paying Living Expenses: Not hard at all  Food Insecurity: No Food Insecurity (11/06/2021)   Hunger Vital Sign    Worried About Running Out of Food in the  Last Year: Never true    Ran Out of Food in the Last Year: Never true  Transportation Needs: No Transportation Needs (11/06/2021)   PRAPARE - Administrator, Civil Service (Medical): No    Lack of Transportation (Non-Medical): No  Physical Activity: Sufficiently Active (11/05/2020)   Exercise Vital Sign    Days of Exercise per Week: 7 days    Minutes of Exercise per Session: 60 min  Stress: No Stress Concern Present (11/05/2020)   [employer] of Occupational Health - Occupational Stress Questionnaire    Feeling of Stress : Not at all  Social Connections: Socially Integrated (11/06/2021)   Social Connection and Isolation Panel [NHANES]    Frequency of Communication with Friends and Family: More than three times a week    Frequency of  Social Gatherings with Friends and Family: More than three times a week    Attends Religious Services: More than 4 times per year    Active Member of [employer] or Organizations: Yes    Attends Engineer, structural: More than 4 times per year    Marital Status: Married     Family History: The patient's family history includes Diabetes in his mother; Lung cancer in his father; Stroke in his paternal grandfather.  ROS:   Please see the history of present illness.    All other systems reviewed and are negative.  EKGs/Labs/Other Studies Reviewed:    The following studies were reviewed today: EKG reveals sinus rhythm with an anterior and inferior wall myocardial infarction of undetermined age.   Recent Labs: 04/08/2021: TSH 2.240 10/09/2021: ALT 27; BUN 15; Creatinine, Ser 1.16; Hemoglobin 16.4; Platelets 198; Potassium 4.7; Sodium 138  Recent Lipid Panel    Component Value Date/Time   CHOL 142 10/09/2021 0933   CHOL 142 06/03/2017 0811   TRIG 151 (H) 10/09/2021 0933   TRIG 123 06/03/2017 0811   HDL 43 10/09/2021 0933   CHOLHDL 3.3 10/09/2021 0933   CHOLHDL 3.0 07/18/2017 0249   VLDL 10 07/18/2017 0249   VLDL 25 06/03/2017  0811   LDLCALC 73 10/09/2021 0933    Physical Exam:    VS:  BP 118/72   Pulse (!) 47   Ht 5\' 9"  (1.753 m)   Wt 206 lb 3.2 oz (93.5 kg)   SpO2 98%   BMI 30.45 kg/m     Wt Readings from Last 3 Encounters:  11/29/21 206 lb 3.2 oz (93.5 kg)  10/09/21 207 lb (93.9 kg)  04/08/21 213 lb 12.8 oz (97 kg)     GEN: Patient is in no acute distress HEENT: Normal NECK: No JVD; No carotid bruits LYMPHATICS: No lymphadenopathy CARDIAC: Hear sounds regular, 2/6 systolic murmur at the apex. RESPIRATORY:  Clear to auscultation without rales, wheezing or rhonchi  ABDOMEN: Soft, non-tender, non-distended MUSCULOSKELETAL:  No edema; No deformity  SKIN: Warm and dry NEUROLOGIC:  Alert and oriented x 3 PSYCHIATRIC:  Normal affect   Signed, 04/10/21, MD  11/29/2021 11:29 AM    [facility]

## 2021-11-29 NOTE — Patient Instructions (Signed)

## 2021-12-05 ENCOUNTER — Other Ambulatory Visit: Payer: Self-pay | Admitting: Physician Assistant

## 2021-12-05 DIAGNOSIS — E7800 Pure hypercholesterolemia, unspecified: Secondary | ICD-10-CM

## 2021-12-05 NOTE — Telephone Encounter (Signed)
Requested medications are due for refill today.  yes  Requested medications are on the active medications list.  yes  Last refill. 06/12/2021 #90 1 rf  Future visit scheduled.   yes  Notes to clinic.  Dr. Charlotta Newton pt.    Requested Prescriptions  Pending Prescriptions Disp Refills   atorvastatin (LIPITOR) 80 MG tablet [Pharmacy Med Name: ATORVASTATIN 80 MG TABLET] 90 tablet 0    Sig: Take 1 tablet (80 mg total) by mouth daily at 6 PM.     Cardiovascular:  Antilipid - Statins Failed - 12/05/2021  3:35 PM      Failed - Lipid Panel in normal range within the last 12 months    Cholesterol, Total  Date Value Ref Range Status  10/09/2021 142 100 - 199 mg/dL Final   Cholesterol Piccolo, Waived  Date Value Ref Range Status  06/03/2017 142 <200 mg/dL Final    Comment:                            Desirable                <200                         Borderline High      200- 239                         High                     >239    LDL Chol Calc (NIH)  Date Value Ref Range Status  10/09/2021 73 0 - 99 mg/dL Final   HDL  Date Value Ref Range Status  10/09/2021 43 >39 mg/dL Final   Triglycerides  Date Value Ref Range Status  10/09/2021 151 (H) 0 - 149 mg/dL Final   Triglycerides Piccolo,Waived  Date Value Ref Range Status  06/03/2017 123 <150 mg/dL Final    Comment:                            Normal                   <150                         Borderline High     150 - 199                         High                200 - 499                         Very High                >499          Passed - Patient is not pregnant      Passed - Valid encounter within last 12 months    Recent Outpatient Visits           1 month ago Scratch of hand, unspecified laterality, initial encounter   [facility] Mecum, Erin E, PA-C   8 months ago Diabetes mellitus without complication (HCC)   [facility] Vigg, Avanti, MD  11 months ago Chronic low back pain  without sciatica, unspecified back pain laterality   [facility] Vigg, Avanti, MD   1 year ago Chronic low back pain without sciatica, unspecified back pain laterality   [facility] Vigg, Avanti, MD   1 year ago Sciatica of right side   [facility] Vigg, Avanti, MD       Future Appointments             In 4 months Cannady, Dorie Rank, NP [employer] [facility], [facility]

## 2021-12-06 ENCOUNTER — Other Ambulatory Visit: Payer: Self-pay

## 2021-12-06 NOTE — Telephone Encounter (Signed)
Entered in error

## 2021-12-10 ENCOUNTER — Other Ambulatory Visit: Payer: Self-pay | Admitting: Nurse Practitioner

## 2021-12-10 DIAGNOSIS — M1000 Idiopathic gout, unspecified site: Secondary | ICD-10-CM

## 2021-12-10 NOTE — Telephone Encounter (Signed)
Requested medication (s) are due for refill today:   Yes for both  Requested medication (s) are on the active medication list:   Yes for both  Future visit scheduled:   Yes   Last ordered:  Allopurinol 09/13/2021 #90, 0 refills;  losartan 06/12/2021 #90, 1 refill  Returned because uric acid due per protocol   Requested Prescriptions  Pending Prescriptions Disp Refills   allopurinol (ZYLOPRIM) 300 MG tablet [Pharmacy Med Name: ALLOPURINOL 300 MG TABLET] 90 tablet 0    Sig: Take 1 tablet (300 mg total) by mouth daily.     Endocrinology:  Gout Agents - allopurinol Failed - 12/10/2021  2:19 PM      Failed - Uric Acid in normal range and within 360 days    Uric Acid  Date Value Ref Range Status  03/28/2019 3.2 (L) 3.8 - 8.4 mg/dL Final    Comment:               Therapeutic target for gout patients: <6.0         Passed - Cr in normal range and within 360 days    Creatinine, Ser  Date Value Ref Range Status  10/09/2021 1.16 0.76 - 1.27 mg/dL Final         Passed - Valid encounter within last 12 months    Recent Outpatient Visits           2 months ago Scratch of hand, unspecified laterality, initial encounter   [facility] Mecum, Erin E, PA-C   8 months ago Diabetes mellitus without complication (HCC)   [facility] Vigg, Avanti, MD   11 months ago Chronic low back pain without sciatica, unspecified back pain laterality   [facility] Vigg, Avanti, MD   1 year ago Chronic low back pain without sciatica, unspecified back pain laterality   [facility] Vigg, Avanti, MD   1 year ago Sciatica of right side   [facility] Vigg, Avanti, MD       Future Appointments             In 4 months Cannady, [place] T, NP [employer], [facility]            Passed - CBC within normal limits and completed in the last 12 months    WBC  Date Value Ref Range Status  10/09/2021 8.1 3.4 - 10.8 x10E3/uL Final  08/28/2019  10.5 4.0 - 10.5 K/uL Final   RBC  Date Value Ref Range Status  10/09/2021 5.61 4.14 - 5.80 x10E6/uL Final  08/28/2019 5.49 4.22 - 5.81 MIL/uL Final   Hemoglobin  Date Value Ref Range Status  10/09/2021 16.4 13.0 - 17.7 g/dL Final   Hematocrit  Date Value Ref Range Status  10/09/2021 49.5 37.5 - 51.0 % Final   MCHC  Date Value Ref Range Status  10/09/2021 33.1 31.5 - 35.7 g/dL Final  62/37/6283 15.1 30.0 - 36.0 g/dL Final   [facility]  Date Value Ref Range Status  10/09/2021 29.2 26.6 - 33.0 pg Final  08/28/2019 29.3 26.0 - 34.0 pg Final   MCV  Date Value Ref Range Status  10/09/2021 88 79 - 97 fL Final   No results found for: "PLTCOUNTKUC", "LABPLAT", "POCPLA" RDW  Date Value Ref Range Status  10/09/2021 13.7 11.6 - 15.4 % Final          losartan (COZAAR) 25 MG tablet [Pharmacy Med Name: LOSARTAN POTASSIUM 25 MG TAB] 90 tablet 0  Sig: Take 1 tablet (25 mg total) by mouth daily.     Cardiovascular:  Angiotensin Receptor Blockers Passed - 12/10/2021  2:19 PM      Passed - Cr in normal range and within 180 days    Creatinine, Ser  Date Value Ref Range Status  10/09/2021 1.16 0.76 - 1.27 mg/dL Final         Passed - K in normal range and within 180 days    Potassium  Date Value Ref Range Status  10/09/2021 4.7 3.5 - 5.2 mmol/L Final         Passed - Patient is not pregnant      Passed - Last BP in normal range    BP Readings from Last 1 Encounters:  11/29/21 118/72         Passed - Valid encounter within last 6 months    Recent Outpatient Visits           2 months ago Scratch of hand, unspecified laterality, initial encounter   [facility] Mecum, Erin E, PA-C   8 months ago Diabetes mellitus without complication (HCC)   [facility] Vigg, Avanti, MD   11 months ago Chronic low back pain without sciatica, unspecified back pain laterality   [facility] Vigg, Avanti, MD   1 year ago Chronic low back pain without  sciatica, unspecified back pain laterality   [facility] Vigg, Avanti, MD   1 year ago Sciatica of right side   [facility] Vigg, Avanti, MD       Future Appointments             In 4 months Cannady, Dorie Rank, NP [employer], [facility]

## 2022-01-06 ENCOUNTER — Other Ambulatory Visit: Payer: Self-pay | Admitting: Physician Assistant

## 2022-01-06 DIAGNOSIS — G4700 Insomnia, unspecified: Secondary | ICD-10-CM

## 2022-01-07 NOTE — Telephone Encounter (Signed)
Requested Prescriptions  Pending Prescriptions Disp Refills   amitriptyline (ELAVIL) 10 MG tablet [Pharmacy Med Name: AMITRIPTYLINE HCL 10 MG TAB] 90 tablet 0    Sig: Take 1 tablet (10 mg total) by mouth at bedtime.     Psychiatry:  Antidepressants - Heterocyclics (TCAs) Passed - 01/06/2022  7:57 AM      Passed - Valid encounter within last 6 months    Recent Outpatient Visits           3 months ago Scratch of hand, unspecified laterality, initial encounter   [facility] Mecum, Erin E, PA-C   9 months ago Diabetes mellitus without complication (HCC)   [facility] Vigg, Avanti, MD   1 year ago Chronic low back pain without sciatica, unspecified back pain laterality   [facility] Vigg, Avanti, MD   1 year ago Chronic low back pain without sciatica, unspecified back pain laterality   [facility] Vigg, Avanti, MD   1 year ago Sciatica of right side   [facility] Vigg, Avanti, MD       Future Appointments             In 3 months Cannady, Dorie Rank, NP [employer], [facility]

## 2022-02-11 ENCOUNTER — Telehealth: Payer: Medicare Other | Admitting: Emergency Medicine

## 2022-02-11 ENCOUNTER — Ambulatory Visit: Payer: Self-pay

## 2022-02-11 DIAGNOSIS — U071 COVID-19: Secondary | ICD-10-CM

## 2022-02-11 MED ORDER — MOLNUPIRAVIR EUA 200MG CAPSULE: 4.0000 | ORAL_CAPSULE | Freq: Two times a day (BID) | ORAL | 0 refills | Status: AC

## 2022-02-11 MED ORDER — BENZONATATE 100 MG PO CAPS: 100.0000 mg | ORAL_CAPSULE | Freq: Two times a day (BID) | ORAL | 0 refills | Status: DC | PRN

## 2022-02-11 NOTE — Progress Notes (Signed)
Virtual Visit Consent   Thomas Miller, you are scheduled for a virtual visit with a [facility] provider today. Just as with appointments in the office, your consent must be obtained to participate. Your consent will be active for this visit and any virtual visit you may have with one of our providers in the next 365 days. If you have a MyChart account, a copy of this consent can be sent to you electronically.  As this is a virtual visit, video technology does not allow for your provider to perform a traditional examination. This may limit your provider's ability to fully assess your condition. If your provider identifies any concerns that need to be evaluated in person or the need to arrange testing (such as labs, EKG, etc.), we will make arrangements to do so. Although advances in technology are sophisticated, we cannot ensure that it will always work on either your end or our end. If the connection with a video visit is poor, the visit may have to be switched to a telephone visit. With either a video or telephone visit, we are not always able to ensure that we have a secure connection.  By engaging in this virtual visit, you consent to the provision of healthcare and authorize for your insurance to be billed (if applicable) for the services provided during this visit. Depending on your insurance coverage, you may receive a charge related to this service.  I need to obtain your verbal consent now. Are you willing to proceed with your visit today? Thomas Miller has provided verbal consent on 02/11/2022 for a virtual visit (video or telephone). Thomas Circle, PA-C  Date: 02/11/2022 9:22 AM  Virtual Visit via Video Note   I, Thomas Miller, connected with  Thomas Miller  ([id number], 08-18-1950) on 02/11/22 at  9:15 AM EST by a video-enabled telemedicine application and verified that I am speaking with the correct person using two identifiers.  Location: Patient: Virtual Visit Location Patient:  Home Provider: Virtual Visit Location Provider: Home   I discussed the limitations of evaluation and management by telemedicine and the availability of in person appointments. The patient expressed understanding and agreed to proceed.    History of Present Illness: Thomas Miller is a 72 y.o. who identifies as a male who was assigned male at birth, and is being seen today for COVID.  Reports cough, congestion, and body aches.  Symptoms started last night.  Denies fever.  States that he thinks he got it from his wife's nursing home.    HPI: HPI  Problems:  Patient Active Problem List   Diagnosis Date Noted   Sciatica 11/20/2021   Body mass index (BMI) 30.0-30.9, adult 12/11/2020   Right leg numbness 12/03/2020   Pain of right lower extremity 12/03/2020   Chronic low back pain without sciatica 11/21/2020   Hearing loss    Kidney stones    Aortic atherosclerosis ([place]) 03/24/2020   History of 2019 novel coronavirus disease (COVID-19) 03/24/2020   History of stroke 10/05/2019   S/P epidural steroid injection 05/2018   PAF (paroxysmal atrial fibrillation) ([place]) 08/07/2017   CAD (coronary artery disease) 08/07/2017   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   Advanced care planning/counseling discussion 11/27/2016   Insomnia 05/19/2016   Essential (primary) hypertension 10/25/2014   Gout 10/25/2014   Reflux 10/25/2014   Hyperlipemia 10/25/2014   BPH (benign prostatic hyperplasia) 10/25/2014   GERD (gastroesophageal reflux disease) 10/25/2014    Allergies:  Allergies  Allergen Reactions  Doxycycline Rash   Ace Inhibitors Cough    "cough"   Codeine Other (See Comments)    Insomnia   Naprosyn [Naproxen] Rash   Medications:  Current Outpatient Medications:    benzonatate (TESSALON) 100 MG capsule, Take 1 capsule (100 mg total) by mouth 2 (two) times daily as needed for cough., Disp: 20 capsule, Rfl: 0   molnupiravir EUA (LAGEVRIO) 200 mg CAPS capsule, Take 4 capsules (800 mg total) by mouth 2  (two) times daily for 5 days., Disp: 40 capsule, Rfl: 0   allopurinol (ZYLOPRIM) 300 MG tablet, Take 1 tablet (300 mg total) by mouth daily., Disp: 90 tablet, Rfl: 4   amitriptyline (ELAVIL) 10 MG tablet, Take 1 tablet (10 mg total) by mouth at bedtime., Disp: 90 tablet, Rfl: 0   [place] Cider Vinegar 600 MG CAPS, Take 600 mg by mouth daily., Disp: , Rfl:    aspirin EC 81 MG tablet, Take 2 tablets (162 mg total) by mouth daily., Disp: 90 tablet, Rfl: 3   atorvastatin (LIPITOR) 80 MG tablet, Take 1 tablet (80 mg total) by mouth daily at 6 PM., Disp: 90 tablet, Rfl: 0   esomeprazole (NEXIUM) 40 MG capsule, Take 1 capsule (40 mg total) by mouth daily before breakfast., Disp: 90 capsule, Rfl: 2   gabapentin (NEURONTIN) 100 MG capsule, Take 1 capsule (100 mg total) by mouth at bedtime., Disp: 90 capsule, Rfl: 2   Liniments (DEEP BLUE RELIEF) GEL, Apply 1 application topically as needed for pain (joints). In joints, Disp: , Rfl:    losartan (COZAAR) 25 MG tablet, Take 1 tablet (25 mg total) by mouth daily., Disp: 90 tablet, Rfl: 4   metoprolol tartrate (LOPRESSOR) 25 MG tablet, Take 1 tablet (25 mg total) by mouth 2 (two) times daily., Disp: 180 tablet, Rfl: 3   nitroGLYCERIN (NITROSTAT) 0.4 MG SL tablet, Place 1 tablet (0.4 mg total) under the tongue every 5 (five) minutes as needed for chest pain., Disp: 25 tablet, Rfl: 3   VITAMIN D, CHOLECALCIFEROL, PO, Take 2,000 mg by mouth daily., Disp: , Rfl:   Observations/Objective: Patient is well-developed, well-nourished in no acute distress.  Resting comfortably at home.  Head is normocephalic, atraumatic.  No labored breathing.  Speech is clear and coherent with logical content.  Patient is alert and oriented at baseline.    Assessment and Plan: 1. COVID-19  Meds ordered this encounter  Medications   molnupiravir EUA (LAGEVRIO) 200 mg CAPS capsule    Sig: Take 4 capsules (800 mg total) by mouth 2 (two) times daily for 5 days.    Dispense:  40  capsule    Refill:  0    Order Specific Question:   Supervising Provider    Answer:   Chase Picket [[id number]]   benzonatate (TESSALON) 100 MG capsule    Sig: Take 1 capsule (100 mg total) by mouth 2 (two) times daily as needed for cough.    Dispense:  20 capsule    Refill:  0    Order Specific Question:   Supervising Provider    Answer:   Chase Picket [[id number]]     Patient is a very well appearing 72 yo male with +COVID 19 test this morning.  He is not in distress.  Breathing easily, reporting only mild symptoms.  We discussed treatment options.  He'd like to try the Molnupiravir and tessalon perles.   Follow Up Instructions: I discussed the assessment and treatment plan with the patient. The patient  was provided an opportunity to ask questions and all were answered. The patient agreed with the plan and demonstrated an understanding of the instructions.  A copy of instructions were sent to the patient via MyChart unless otherwise noted below.     The patient was advised to call back or seek an in-person evaluation if the symptoms worsen or if the condition fails to improve as anticipated.  Time:  I spent 10 minutes with the patient via telehealth technology discussing the above problems/concerns.    Roxy Horseman, PA-C

## 2022-02-11 NOTE — Patient Instructions (Signed)
Thomas Miller, thank you for joining Montine Circle, PA-C for today's virtual visit.  While this provider is not your primary care provider (PCP), if your PCP is located in our provider database this encounter information will be shared with them immediately following your visit.   [facility] account gives you access to today's visit and all your visits, tests, and labs performed at [facility] " click here if you don't have a [facility] account or go to [email or web address]  Consent: (Patient) Thomas Miller provided verbal consent for this virtual visit at the beginning of the encounter.  Current Medications:  Current Outpatient Medications:    benzonatate (TESSALON) 100 MG capsule, Take 1 capsule (100 mg total) by mouth 2 (two) times daily as needed for cough., Disp: 20 capsule, Rfl: 0   molnupiravir EUA (LAGEVRIO) 200 mg CAPS capsule, Take 4 capsules (800 mg total) by mouth 2 (two) times daily for 5 days., Disp: 40 capsule, Rfl: 0   allopurinol (ZYLOPRIM) 300 MG tablet, Take 1 tablet (300 mg total) by mouth daily., Disp: 90 tablet, Rfl: 4   amitriptyline (ELAVIL) 10 MG tablet, Take 1 tablet (10 mg total) by mouth at bedtime., Disp: 90 tablet, Rfl: 0   [place] Cider Vinegar 600 MG CAPS, Take 600 mg by mouth daily., Disp: , Rfl:    aspirin EC 81 MG tablet, Take 2 tablets (162 mg total) by mouth daily., Disp: 90 tablet, Rfl: 3   atorvastatin (LIPITOR) 80 MG tablet, Take 1 tablet (80 mg total) by mouth daily at 6 PM., Disp: 90 tablet, Rfl: 0   esomeprazole (NEXIUM) 40 MG capsule, Take 1 capsule (40 mg total) by mouth daily before breakfast., Disp: 90 capsule, Rfl: 2   gabapentin (NEURONTIN) 100 MG capsule, Take 1 capsule (100 mg total) by mouth at bedtime., Disp: 90 capsule, Rfl: 2   Liniments (DEEP BLUE RELIEF) GEL, Apply 1 application topically as needed for pain (joints). In joints, Disp: , Rfl:    losartan (COZAAR) 25 MG tablet, Take 1 tablet (25 mg total)  by mouth daily., Disp: 90 tablet, Rfl: 4   metoprolol tartrate (LOPRESSOR) 25 MG tablet, Take 1 tablet (25 mg total) by mouth 2 (two) times daily., Disp: 180 tablet, Rfl: 3   nitroGLYCERIN (NITROSTAT) 0.4 MG SL tablet, Place 1 tablet (0.4 mg total) under the tongue every 5 (five) minutes as needed for chest pain., Disp: 25 tablet, Rfl: 3   VITAMIN D, CHOLECALCIFEROL, PO, Take 2,000 mg by mouth daily., Disp: , Rfl:    Medications ordered in this encounter:  Meds ordered this encounter  Medications   molnupiravir EUA (LAGEVRIO) 200 mg CAPS capsule    Sig: Take 4 capsules (800 mg total) by mouth 2 (two) times daily for 5 days.    Dispense:  40 capsule    Refill:  0    Order Specific Question:   Supervising Provider    Answer:   Chase Picket [[id number]]   benzonatate (TESSALON) 100 MG capsule    Sig: Take 1 capsule (100 mg total) by mouth 2 (two) times daily as needed for cough.    Dispense:  20 capsule    Refill:  0    Order Specific Question:   Supervising Provider    Answer:   Chase Picket [id number]     *If you need refills on other medications prior to your next appointment, please contact your pharmacy*  Follow-Up: Call back or seek an in-person  evaluation if the symptoms worsen or if the condition fails to improve as anticipated.  [facility] [phone]  Other Instructions    If you have been instructed to have an in-person evaluation today at a local Urgent Care facility, please use the link below. It will take you to a list of all of our available [facility], including address, phone number and hours of operation. Please do not delay care.  [facility]  If you or a family member do not have a primary care provider, use the link below to schedule a visit and establish care. When you choose a [facility] primary care physician or advanced practice provider, you gain a long-term partner in health. Find a Primary Care  Provider  Learn more about 's in-office and virtual care options: [facility]

## 2022-02-11 NOTE — Telephone Encounter (Signed)
  Chief Complaint: COVID+ Symptoms: Cough, HA, BA, Sore throat, congestion Frequency: Overnight Pertinent Negatives: Patient denies Fever Disposition: [] ED /[] Urgent Care (no appt availability in office) / [] Appointment(In office/virtual)/ [x]  [facility]/ [] Home Care/ [] Refused Recommended Disposition /[] [facility]/ []  Follow-up with PCP Additional Notes: Pt thinks he contracted COVID while visiting wife. S/s started overnight. Pt is interested in Antiviral medication.    Summary: positive for covid   Patient states that he tested positive for covid this morning and he feels bad. Congestion, cough, sore and is wanting to see if some medication can be called into his pharmacy.  Please advise     Reason for Disposition  [1] HIGH RISK patient (e.g., weak immune system, age > 31 years, obesity with BMI 30 or higher, pregnant, chronic lung disease or other chronic medical condition) AND [2] COVID symptoms (e.g., cough, fever)  (Exceptions: Already seen by PCP and no new or worsening symptoms.)  Answer Assessment - Initial Assessment Questions 1. COVID-19 DIAGNOSIS: "How do you know that you have COVID?" (e.g., positive lab test or self-test, diagnosed by doctor or NP/PA, symptoms after exposure).     Home test 2. COVID-19 EXPOSURE: "Was there any known exposure to COVID before the symptoms began?" CDC Definition of close contact: within 6 feet (2 meters) for a total of 15 minutes or more over a 24-hour period.      At [facility] 3. ONSET: "When did the COVID-19 symptoms start?"      Over night 4. WORST SYMPTOM: "What is your worst symptom?" (e.g., cough, fever, shortness of breath, muscle aches)     Cough - HA 5. COUGH: "Do you have a cough?" If Yes, ask: "How bad is the cough?"       Coughing every couple of minutes 6. FEVER: "Do you have a fever?" If Yes, ask: "What is your temperature, how was it measured, and when did it start?"     no 7. RESPIRATORY STATUS:  "Describe your breathing?" (e.g., normal; shortness of breath, wheezing, unable to speak)      Normal - a little when he woke up 8. BETTER-SAME-WORSE: "Are you getting better, staying the same or getting worse compared to yesterday?"  If getting worse, ask, "In what way?"     worse 9. OTHER SYMPTOMS: "Do you have any other symptoms?"  (e.g., chills, fatigue, headache, loss of smell or taste, muscle pain, sore throat)     HA, BA, Sore throat, and congestion 10. HIGH RISK DISEASE: "Do you have any chronic medical problems?" (e.g., asthma, heart or lung disease, weak immune system, obesity, etc.)       Heart , HTN 11. VACCINE: "Have you had the COVID-19 vaccine?" If Yes, ask: "Which one, how many shots, when did you get it?"       3 12. PREGNANCY: "Is there any chance you are pregnant?" "When was your last menstrual period?"        13. O2 SATURATION MONITOR:  "Do you use an oxygen saturation monitor (pulse oximeter) at home?" If Yes, ask "What is your reading (oxygen level) today?" "What is your usual oxygen saturation reading?" (e.g., 95%)  Protocols used: Coronavirus (COVID-19) Diagnosed or Suspected-A-AH

## 2022-03-03 ENCOUNTER — Other Ambulatory Visit: Payer: Self-pay | Admitting: Nurse Practitioner

## 2022-03-03 DIAGNOSIS — E7800 Pure hypercholesterolemia, unspecified: Secondary | ICD-10-CM

## 2022-03-04 NOTE — Telephone Encounter (Signed)
Requested Prescriptions  Pending Prescriptions Disp Refills   atorvastatin (LIPITOR) 80 MG tablet [Pharmacy Med Name: ATORVASTATIN 80 MG TABLET] 90 tablet 2    Sig: Take 1 tablet (80 mg total) by mouth daily at 6 PM.     Cardiovascular:  Antilipid - Statins Failed - 03/03/2022  6:45 AM      Failed - Lipid Panel in normal range within the last 12 months    Cholesterol, Total  Date Value Ref Range Status  10/09/2021 142 100 - 199 mg/dL Final   Cholesterol Piccolo, Waived  Date Value Ref Range Status  06/03/2017 142 <200 mg/dL Final    Comment:                            Desirable                <200                         Borderline High      200- 239                         High                     >239    LDL Chol Calc (NIH)  Date Value Ref Range Status  10/09/2021 73 0 - 99 mg/dL Final   HDL  Date Value Ref Range Status  10/09/2021 43 >39 mg/dL Final   Triglycerides  Date Value Ref Range Status  10/09/2021 151 (H) 0 - 149 mg/dL Final   Triglycerides Piccolo,Waived  Date Value Ref Range Status  06/03/2017 123 <150 mg/dL Final    Comment:                            Normal                   <150                         Borderline High     150 - 199                         High                200 - 499                         Very High                >499          Passed - Patient is not pregnant      Passed - Valid encounter within last 12 months    Recent Outpatient Visits           4 months ago Scratch of hand, unspecified laterality, initial encounter   [place], Erin E, PA-C   11 months ago Diabetes mellitus without complication ([place])   [place] [facility] Vigg, Avanti, MD   1 year ago Chronic low back pain without sciatica, unspecified back pain laterality   [place] Vigg, Avanti, MD   1 year ago Chronic low back pain without sciatica, unspecified back pain laterality  [place] Vigg, Avanti, MD   1 year ago Sciatica of right side   [place] Vigg, Avanti, MD       Future Appointments             In 1 month Cannady, Barbaraann Faster, NP [place], [place]

## 2022-04-06 DIAGNOSIS — R7309 Other abnormal glucose: Secondary | ICD-10-CM

## 2022-04-06 HISTORY — DX: Other abnormal glucose: R73.09

## 2022-04-06 NOTE — Patient Instructions (Signed)

## 2022-04-07 ENCOUNTER — Other Ambulatory Visit: Payer: Self-pay | Admitting: Physician Assistant

## 2022-04-07 DIAGNOSIS — G4700 Insomnia, unspecified: Secondary | ICD-10-CM

## 2022-04-08 NOTE — Telephone Encounter (Signed)
Requested Prescriptions  Pending Prescriptions Disp Refills   amitriptyline (ELAVIL) 10 MG tablet [Pharmacy Med Name: AMITRIPTYLINE HCL 10 MG TAB] 90 tablet 0    Sig: Take 1 tablet (10 mg total) by mouth at bedtime.     Psychiatry:  Antidepressants - Heterocyclics (TCAs) Passed - 04/07/2022  7:23 AM      Passed - Valid encounter within last 6 months    Recent Outpatient Visits           6 months ago Scratch of hand, unspecified laterality, initial encounter   [place], Erin E, PA-C   1 year ago Diabetes mellitus without complication ([place])   [place] [facility] Vigg, Avanti, MD   1 year ago Chronic low back pain without sciatica, unspecified back pain laterality   [place] Vigg, Avanti, MD   1 year ago Chronic low back pain without sciatica, unspecified back pain laterality   [place] Vigg, Avanti, MD   1 year ago Sciatica of right side   [place] Vigg, Avanti, MD       Future Appointments             Tomorrow [place], Barbaraann Faster, NP [place], [facility]

## 2022-04-09 ENCOUNTER — Encounter: Payer: Self-pay | Admitting: Nurse Practitioner

## 2022-04-09 ENCOUNTER — Ambulatory Visit (INDEPENDENT_AMBULATORY_CARE_PROVIDER_SITE_OTHER): Payer: Medicare Other | Admitting: Nurse Practitioner

## 2022-04-09 VITALS — BP 128/78 | HR 51 | Temp 97.6°F | Resp 20 | Ht 69.02 in | Wt 214.3 lb

## 2022-04-09 DIAGNOSIS — M1A0720 Idiopathic chronic gout, left ankle and foot, without tophus (tophi): Secondary | ICD-10-CM

## 2022-04-09 DIAGNOSIS — E782 Mixed hyperlipidemia: Secondary | ICD-10-CM | POA: Diagnosis not present

## 2022-04-09 DIAGNOSIS — I10 Essential (primary) hypertension: Secondary | ICD-10-CM

## 2022-04-09 DIAGNOSIS — I700 Atherosclerosis of aorta: Secondary | ICD-10-CM | POA: Diagnosis not present

## 2022-04-09 DIAGNOSIS — K219 Gastro-esophageal reflux disease without esophagitis: Secondary | ICD-10-CM

## 2022-04-09 DIAGNOSIS — I25118 Atherosclerotic heart disease of native coronary artery with other forms of angina pectoris: Secondary | ICD-10-CM

## 2022-04-09 DIAGNOSIS — Z8673 Personal history of transient ischemic attack (TIA), and cerebral infarction without residual deficits: Secondary | ICD-10-CM

## 2022-04-09 DIAGNOSIS — I480 Paroxysmal atrial fibrillation: Secondary | ICD-10-CM | POA: Diagnosis not present

## 2022-04-09 DIAGNOSIS — R7309 Other abnormal glucose: Secondary | ICD-10-CM | POA: Diagnosis not present

## 2022-04-09 DIAGNOSIS — F5101 Primary insomnia: Secondary | ICD-10-CM

## 2022-04-09 LAB — BAYER DCA HB A1C WAIVED: HB A1C (BAYER DCA - WAIVED): 6.1 % — ABNORMAL HIGH (ref 4.8–5.6)

## 2022-04-09 LAB — MICROALBUMIN, URINE WAIVED
Creatinine, Urine Waived: 300 mg/dL (ref 10–300)
Microalb, Ur Waived: 80 mg/L — ABNORMAL HIGH (ref 0–19)

## 2022-04-09 MED ORDER — ESOMEPRAZOLE MAGNESIUM 40 MG PO CPDR: 40.0000 mg | DELAYED_RELEASE_CAPSULE | Freq: Every day | ORAL | 4 refills | Status: DC

## 2022-04-09 MED ORDER — GABAPENTIN 100 MG PO CAPS: 100.0000 mg | ORAL_CAPSULE | Freq: Every day | ORAL | 4 refills | Status: DC

## 2022-04-09 NOTE — Assessment & Plan Note (Signed)
Chronic, ongoing.  Continue current medication regimen and adjust as needed. Lipid panel today. 

## 2022-04-09 NOTE — Assessment & Plan Note (Signed)
Chronic, followed by cardiology.  On BB, but no anticoagulant at this time.  Continue current medication regimen as recommended by them.

## 2022-04-09 NOTE — Assessment & Plan Note (Signed)
A1c slight trend up at 6.1% today, monitor closely and continue diet/exercise focus at home.  Discussed at length with patient.

## 2022-04-09 NOTE — Assessment & Plan Note (Signed)
Chronic, stable.  BP at goal on exam today and on home checks.  Recommend he continue to monitor BP at home daily + document and bring to provider visits.  Continue current medication regimen at this time and adjust as needed. LAB: CBC, CMP, TSH, urine ALB today.  Focus on DASH diet at home.  Refills up to date.  Return in 6 months.

## 2022-04-09 NOTE — Assessment & Plan Note (Signed)
Chronic, stable on daily Nexium.  Continue current regimen and adjust as needed.  Mag level today.

## 2022-04-09 NOTE — Assessment & Plan Note (Addendum)
Chronic, stable on Allopurinol with recent kidney function and uric acid level today.  Continue current regimen and adjust as needed.  No recent flares.

## 2022-04-09 NOTE — Progress Notes (Signed)
BP 128/78 (BP Location: Left Arm, Patient Position: Sitting, Cuff Size: Normal)   Pulse (!) 51   Temp 97.6 F (36.4 C) (Oral)   Resp 20   Ht 5' 9.02" (1.753 m)   Wt 214 lb 4.8 oz (97.2 kg)   SpO2 94%   BMI 31.63 kg/m    Subjective:    Patient ID: Thomas Miller, male    DOB: 1950/06/11, 72 y.o.   MRN: [id number]  HPI: Thomas Miller is a 72 y.o. male  Chief Complaint  Patient presents with   Hypertension   Hyperlipidemia   Coronary Artery Disease   HYPERTENSION / HYPERLIPIDEMIA + CAD Last saw cardiology on 11/29/21, no changes.  Has used NTG recently -- about one week ago, one dose in afternoon and then next morning.  This relieved most of the chest pain.   Continues on Losartan, Metoprolol, Lipitor.  History of several mini strokes per patient. Satisfied with current treatment? yes Duration of hypertension: chronic BP monitoring frequency: daily BP range: <130/75 BP medication side effects: no Duration of hyperlipidemia: chronic Cholesterol medication side effects: no Cholesterol supplements: none Medication compliance: good compliance Aspirin: yes Recent stressors: no Recurrent headaches: no Visual changes: no Palpitations: no Dyspnea: no -- only during Covid in January/February Chest pain: yes -- relieved with NTG Lower extremity edema: no Dizzy/lightheaded: no   ATRIAL FIBRILLATION Currently not on anticoagulant. Atrial fibrillation status: stable Satisfied with current treatment: yes  Medication side effects:  no Medication compliance: good compliance Etiology of atrial fibrillation:  Palpitations:  no Chest pain:  as above Dyspnea on exertion:  no Orthopnea:  no Syncope:  no Edema:  no Ventricular rate control: B-blocker Anti-coagulation:  not currently    PREDIABETES Last A1c in September was 6%. Does endorse enjoying sweets.  Continues on Nexium for GERD and Allopurinol for history of gout. Polydipsia/polyuria: no Visual disturbance:  no Chest pain: as above Paresthesias: no  Relevant past medical, surgical, family and social history reviewed and updated as indicated. Interim medical history since our last visit reviewed. Allergies and medications reviewed and updated.  Review of Systems  Constitutional:  Negative for activity change, diaphoresis, fatigue and fever.  Respiratory:  Negative for cough, chest tightness, shortness of breath and wheezing.   Cardiovascular:  Positive for chest pain (resolved with NTG). Negative for palpitations and leg swelling.  Gastrointestinal: Negative.   Endocrine: Negative for polydipsia, polyphagia and polyuria.  Neurological: Negative.   Psychiatric/Behavioral: Negative.      Per HPI unless specifically indicated above     Objective:    BP 128/78 (BP Location: Left Arm, Patient Position: Sitting, Cuff Size: Normal)   Pulse (!) 51   Temp 97.6 F (36.4 C) (Oral)   Resp 20   Ht 5' 9.02" (1.753 m)   Wt 214 lb 4.8 oz (97.2 kg)   SpO2 94%   BMI 31.63 kg/m   Wt Readings from Last 3 Encounters:  04/09/22 214 lb 4.8 oz (97.2 kg)  11/29/21 206 lb 3.2 oz (93.5 kg)  10/09/21 207 lb (93.9 kg)    Physical Exam Vitals and nursing note reviewed.  Constitutional:      General: He is awake. He is not in acute distress.    Appearance: He is well-developed and well-groomed. He is obese. He is not ill-appearing or toxic-appearing.  HENT:     Head: Normocephalic.     Right Ear: Hearing and external ear normal.     Left Ear: Hearing and  external ear normal.  Eyes:     General: Lids are normal.     Extraocular Movements: Extraocular movements intact.     Conjunctiva/sclera: Conjunctivae normal.  Neck:     Thyroid: No thyromegaly.     Vascular: No carotid bruit.  Cardiovascular:     Rate and Rhythm: Regular rhythm. Bradycardia present.     Heart sounds: Murmur heard.     Systolic murmur is present.  Pulmonary:     Effort: No accessory muscle usage or respiratory distress.      Breath sounds: Normal breath sounds.  Abdominal:     General: Bowel sounds are normal. There is no distension.     Palpations: Abdomen is soft.     Tenderness: There is no abdominal tenderness.  Musculoskeletal:     Cervical back: Full passive range of motion without pain.     Right lower leg: No edema.     Left lower leg: No edema.  Lymphadenopathy:     Cervical: No cervical adenopathy.  Skin:    General: Skin is warm.     Capillary Refill: Capillary refill takes less than 2 seconds.  Neurological:     Mental Status: He is alert and oriented to person, place, and time.     Deep Tendon Reflexes: Reflexes are normal and symmetric.     Reflex Scores:      Brachioradialis reflexes are 2+ on the right side and 2+ on the left side.      Patellar reflexes are 2+ on the right side and 2+ on the left side. Psychiatric:        Attention and Perception: Attention normal.        Mood and Affect: Mood normal.        Speech: Speech normal.        Behavior: Behavior normal. Behavior is cooperative.        Thought Content: Thought content normal.     Results for orders placed or performed in visit on 10/09/21  Comp Met (CMET)  Result Value Ref Range   Glucose 95 70 - 99 mg/dL   BUN 15 8 - 27 mg/dL   Creatinine, Ser 1.16 0.76 - 1.27 mg/dL   eGFR 67 >59 mL/min/1.73   BUN/Creatinine Ratio 13 10 - 24   Sodium 138 134 - 144 mmol/L   Potassium 4.7 3.5 - 5.2 mmol/L   Chloride 100 96 - 106 mmol/L   CO2 24 20 - 29 mmol/L   Calcium 9.8 8.6 - 10.2 mg/dL   Total Protein 7.4 6.0 - 8.5 g/dL   Albumin 5.0 (H) 3.8 - 4.8 g/dL   Globulin, Total 2.4 1.5 - 4.5 g/dL   Albumin/Globulin Ratio 2.1 1.2 - 2.2   Bilirubin Total 0.9 0.0 - 1.2 mg/dL   Alkaline Phosphatase 102 44 - 121 IU/L   AST 20 0 - 40 IU/L   ALT 27 0 - 44 IU/L  CBC w/Diff  Result Value Ref Range   WBC 8.1 3.4 - 10.8 x10E3/uL   RBC 5.61 4.14 - 5.80 x10E6/uL   Hemoglobin 16.4 13.0 - 17.7 g/dL   Hematocrit 49.5 37.5 - 51.0 %   MCV 88 79  - 97 fL   MCH 29.2 26.6 - 33.0 pg   MCHC 33.1 31.5 - 35.7 g/dL   RDW 13.7 11.6 - 15.4 %   Platelets 198 150 - 450 x10E3/uL   Neutrophils 55 Not Estab. %   Lymphs 33 Not Estab. %   Monocytes  9 Not Estab. %   Eos 2 Not Estab. %   Basos 1 Not Estab. %   Neutrophils Absolute 4.4 1.4 - 7.0 x10E3/uL   Lymphocytes Absolute 2.7 0.7 - 3.1 x10E3/uL   Monocytes Absolute 0.8 0.1 - 0.9 x10E3/uL   EOS (ABSOLUTE) 0.2 0.0 - 0.4 x10E3/uL   Basophils Absolute 0.1 0.0 - 0.2 x10E3/uL   Immature Granulocytes 0 Not Estab. %   Immature Grans (Abs) 0.0 0.0 - 0.1 x10E3/uL  HgB A1c  Result Value Ref Range   Hgb A1c MFr Bld 6.0 (H) 4.8 - 5.6 %   Est. average glucose Bld gHb Est-mCnc 126 mg/dL  Lipid panel  Result Value Ref Range   Cholesterol, Total 142 100 - 199 mg/dL   Triglycerides 151 (H) 0 - 149 mg/dL   HDL 43 >39 mg/dL   VLDL Cholesterol Cal 26 5 - 40 mg/dL   LDL Chol Calc (NIH) 73 0 - 99 mg/dL   Chol/HDL Ratio 3.3 0.0 - 5.0 ratio  Specimen status report  Result Value Ref Range   specimen status report Comment       Assessment & Plan:   Problem List Items Addressed This Visit       Cardiovascular and Mediastinum   Aortic atherosclerosis ([place])    Ongoing.  Noted on CT 03/01/2019 -- continue statin daily for prevention + ASA.      Relevant Orders   Comprehensive metabolic panel   Lipid Panel w/o Chol/HDL Ratio   CAD (coronary artery disease)    Chronic, stable.  Recent NTG use one week ago which resolved CP -- monitor closely and if increased use to alert PCP or cardiology.  Continue collaboration with cardiology, recent notes reviewed.      Relevant Orders   Comprehensive metabolic panel   Lipid Panel w/o Chol/HDL Ratio   Essential (primary) hypertension    Chronic, stable.  BP at goal on exam today and on home checks.  Recommend he continue to monitor BP at home daily + document and bring to provider visits.  Continue current medication regimen at this time and adjust as needed. LAB:  CBC, CMP, TSH, urine ALB today.  Focus on DASH diet at home.  Refills up to date.  Return in 6 months.        Relevant Orders   Microalbumin, Urine Waived   Comprehensive metabolic panel   CBC with Differential/Platelet   TSH   PAF (paroxysmal atrial fibrillation) (HCC) - Primary    Chronic, followed by cardiology.  On BB, but no anticoagulant at this time.  Continue current medication regimen as recommended by them.        Relevant Orders   Comprehensive metabolic panel   Lipid Panel w/o Chol/HDL Ratio     Digestive   GERD without esophagitis    Chronic, stable on daily Nexium.  Continue current regimen and adjust as needed.  Mag level today.       Relevant Medications   esomeprazole (NEXIUM) 40 MG capsule   Other Relevant Orders   Magnesium     Other   Elevated hemoglobin A1c measurement    A1c slight trend up at 6.1% today, monitor closely and continue diet/exercise focus at home.  Discussed at length with patient.      Relevant Orders   Bayer DCA Hb A1c Waived   Microalbumin, Urine Waived   Gout    Chronic, stable on Allopurinol with recent kidney function and uric acid level today.  Continue  current regimen and adjust as needed.  No recent flares.      Relevant Orders   Uric acid   History of stroke    Several years ago, monitor closely with goals BP <130/80, A1c <6.5%, and LDL <55.  Discussed with patient.      Hyperlipemia    Chronic, ongoing.  Continue current medication regimen and adjust as needed.  Lipid panel today.      Relevant Orders   Comprehensive metabolic panel   Lipid Panel w/o Chol/HDL Ratio     Follow up plan: Return in about 6 months (around 10/10/2022) for Annual Physical after 09/28/2022.

## 2022-04-09 NOTE — Assessment & Plan Note (Signed)
Several years ago, monitor closely with goals BP <130/80, A1c <6.5%, and LDL <55.  Discussed with patient.

## 2022-04-09 NOTE — Assessment & Plan Note (Signed)
Chronic, stable.  Recent NTG use one week ago which resolved CP -- monitor closely and if increased use to alert PCP or cardiology.  Continue collaboration with cardiology, recent notes reviewed.

## 2022-04-09 NOTE — Assessment & Plan Note (Signed)
Ongoing.  Noted on CT 03/01/2019 -- continue statin daily for prevention + ASA.

## 2022-04-10 LAB — LIPID PANEL W/O CHOL/HDL RATIO
Cholesterol, Total: 113 mg/dL (ref 100–199)
HDL: 44 mg/dL (ref >39)
LDL Chol Calc (NIH): 54 mg/dL (ref 0–99)
Triglycerides: 70 mg/dL (ref 0–149)
VLDL Cholesterol Cal: 15 mg/dL (ref 5–40)

## 2022-04-10 LAB — COMPREHENSIVE METABOLIC PANEL
ALT: 22 IU/L (ref 0–44)
AST: 24 IU/L (ref 0–40)
Albumin/Globulin Ratio: 2.6 — ABNORMAL HIGH (ref 1.2–2.2)
Albumin: 4.6 g/dL (ref 3.8–4.8)
Alkaline Phosphatase: 93 IU/L (ref 44–121)
BUN/Creatinine Ratio: 13 (ref 10–24)
BUN: 14 mg/dL (ref 8–27)
Bilirubin Total: 0.7 mg/dL (ref 0.0–1.2)
CO2: 21 mmol/L (ref 20–29)
Calcium: 9.6 mg/dL (ref 8.6–10.2)
Chloride: 104 mmol/L (ref 96–106)
Creatinine, Ser: 1.06 mg/dL (ref 0.76–1.27)
Globulin, Total: 1.8 g/dL (ref 1.5–4.5)
Glucose: 96 mg/dL (ref 70–99)
Potassium: 4.4 mmol/L (ref 3.5–5.2)
Sodium: 140 mmol/L (ref 134–144)
Total Protein: 6.4 g/dL (ref 6.0–8.5)
eGFR: 75 mL/min/{1.73_m2} (ref >59)

## 2022-04-10 LAB — CBC WITH DIFFERENTIAL/PLATELET
Basophils Absolute: 0.1 10*3/uL (ref 0.0–0.2)
Basos: 1 %
EOS (ABSOLUTE): 0.2 10*3/uL (ref 0.0–0.4)
Eos: 3 %
Hematocrit: 47.6 % (ref 37.5–51.0)
Hemoglobin: 16 g/dL (ref 13.0–17.7)
Immature Grans (Abs): 0 10*3/uL (ref 0.0–0.1)
Immature Granulocytes: 0 %
Lymphocytes Absolute: 2.4 10*3/uL (ref 0.7–3.1)
Lymphs: 35 %
MCH: 29.7 pg (ref 26.6–33.0)
MCHC: 33.6 g/dL (ref 31.5–35.7)
MCV: 88 fL (ref 79–97)
Monocytes Absolute: 0.7 10*3/uL (ref 0.1–0.9)
Monocytes: 9 %
Neutrophils Absolute: 3.7 10*3/uL (ref 1.4–7.0)
Neutrophils: 52 %
Platelets: 172 10*3/uL (ref 150–450)
RBC: 5.39 x10E6/uL (ref 4.14–5.80)
RDW: 13.7 % (ref 11.6–15.4)
WBC: 7 10*3/uL (ref 3.4–10.8)

## 2022-04-10 LAB — MAGNESIUM: Magnesium: 2 mg/dL (ref 1.6–2.3)

## 2022-04-10 LAB — TSH: TSH: 2.31 u[IU]/mL (ref 0.450–4.500)

## 2022-04-10 LAB — URIC ACID: Uric Acid: 4.2 mg/dL (ref 3.8–8.4)

## 2022-04-10 NOTE — Progress Notes (Signed)
Contacted via [place] evening [place], your labs have returned and overall these are nice and normal.  I have no concerns on these.  Continue all of your current medications. Any questions? Keep being amazing!!  Thank you for allowing me to participate in your care.  I appreciate you. Kindest regards, Shirleymae Hauth

## 2022-05-19 ENCOUNTER — Telehealth: Payer: Self-pay | Admitting: *Deleted

## 2022-05-19 ENCOUNTER — Encounter: Payer: Self-pay | Admitting: *Deleted

## 2022-05-19 NOTE — Patient Instructions (Signed)
Visit Information  Thank you for taking time to visit with me today. Please don't hesitate to contact me if I can be of assistance to you.  Following are the goals we discussed today:  Continue monitoring diet and checking BP/HR daily.   Please call the care guide team at [phone] if you need to cancel or reschedule your appointment.   Please call the Suicide and Crisis Lifeline: 988 call the [place] National Suicide Prevention Lifeline: 234-187-7104 or TTY: (862) 477-2439 TTY 380-041-3698) to talk to a trained counselor call 1-800-273-TALK (toll free, 24 hour hotline) call 911 if you are experiencing a Mental Health or Behavioral Health Crisis or need someone to talk to.  Patient verbalizes understanding of instructions and care plan provided today and agrees to view in MyChart. Active MyChart status and patient understanding of how to access instructions and care plan via MyChart confirmed with patient.     The patient has been provided with contact information for the care management team and has been advised to call with any health related questions or concerns.   Kemper Durie, RN, MSN, [facility] Care Management Care Management Coordinator [phone]

## 2022-05-19 NOTE — Patient Outreach (Signed)
  Care Coordination   Initial Visit Note   05/19/2022 Name: Thomas Miller MRN: [id number] DOB: 1950/06/12  Thomas Miller is a 72 y.o. year old male who sees Thomas Miller, Thomas Dandy T, NP for primary care. I spoke with  Thomas Miller by phone today.  What matters to the patients health and wellness today?  Patient report he is healthy and active, works to continue healthy lifestyle (exercising and monitoring diet).    Goals Addressed             This Visit's Progress    COMPLETED: Care Coordination Activities - No follow up needed       Care Coordination Interventions: Provided education on importance of blood pressure control in management of CAD Provided education on Importance of limiting foods high in cholesterol Reviewed Importance of taking all medications as prescribed Reviewed Importance of attending all scheduled provider appointments Advised to report any changes in symptoms or exercise tolerance Screening for signs and symptoms of depression related to chronic disease state Assessed social determinant of health barriers         SDOH assessments and interventions completed:  Yes  SDOH Interventions Today    Flowsheet Row Most Recent Value  SDOH Interventions   Food Insecurity Interventions Intervention Not Indicated  Housing Interventions Intervention Not Indicated  Transportation Interventions Intervention Not Indicated  Utilities Interventions Intervention Not Indicated        Care Coordination Interventions:  Yes, provided   Interventions Today    Flowsheet Row Most Recent Value  Chronic Disease   Chronic disease during today's visit Other  [CAD]  General Interventions   General Interventions Discussed/Reviewed General Interventions Discussed, Health Screening, Doctor Visits  Doctor Visits Discussed/Reviewed Doctor Visits Discussed, Annual Wellness Visits, PCP  [PCP physical on 9/16]  Health Screening Colonoscopy, Prostate  PCP/Specialist Visits Compliance  with follow-up visit  Exercise Interventions   Exercise Discussed/Reviewed Physical Activity  Physical Activity Discussed/Reviewed Physical Activity Discussed  [Walk 6000 steps daily, plays golf twice a week]  Education Interventions   Education Provided Provided Education  Provided Verbal Education On Nutrition, When to see the doctor, Medication, Other, Exercise  [daily blood pressure and heart rate monitoring]  Nutrition Interventions   Nutrition Discussed/Reviewed Decreasing fats, Nutrition Discussed, Decreasing salt  Pharmacy Interventions   Pharmacy Dicussed/Reviewed Medications and their functions, Affording Medications, Pharmacy Topics Discussed       Follow up plan: No further intervention required.   Encounter Outcome:  Pt. Visit Completed   Thomas Durie, RN,  MSN, [facility] Care Management Care Management Coordinator [phone]

## 2022-07-16 ENCOUNTER — Other Ambulatory Visit: Payer: Self-pay | Admitting: Physician Assistant

## 2022-07-16 DIAGNOSIS — G4700 Insomnia, unspecified: Secondary | ICD-10-CM

## 2022-07-16 NOTE — Telephone Encounter (Signed)
Unable to refill per protocol, Rx expired. Discontinued 01/07/22.  Requested Prescriptions  Pending Prescriptions Disp Refills   amitriptyline (ELAVIL) 10 MG tablet [Pharmacy Med Name: AMITRIPTYLINE HCL 10 MG TAB] 90 tablet 0    Sig: Take 1 tablet (10 mg total) by mouth at bedtime.     Psychiatry:  Antidepressants - Heterocyclics (TCAs) Passed - 07/16/2022 10:11 AM      Passed - Valid encounter within last 6 months    Recent Outpatient Visits           3 months ago PAF (paroxysmal atrial fibrillation) (HCC)   [place] [facility] [place], Dorie Rank, NP   9 months ago Scratch of hand, unspecified laterality, initial encounter   [place] [facility] Mec[place], Erin E, PA-C   1 year ago Diabetes mellitus without complication (HCC)   [place] [facility] Vigg, Avanti, MD   1 year ago Chronic low back pain without sciatica, unspecified back pain laterality   [place] [facility] Vigg, Avanti, MD   1 year ago Chronic low back pain without sciatica, unspecified back pain laterality   [place] [facility] Vigg, Avanti, MD       Future Appointments             In 1 month Revankar, Aundra Dubin, MD [facility] [place]   In 2 months Cannady, Dorie Rank, NP [place] [facility], [facility]

## 2022-07-17 ENCOUNTER — Other Ambulatory Visit: Payer: Self-pay | Admitting: Physician Assistant

## 2022-07-17 DIAGNOSIS — G4700 Insomnia, unspecified: Secondary | ICD-10-CM

## 2022-07-17 NOTE — Telephone Encounter (Signed)
Unable to refill per protocol, Rx expired. Discontinued 01/07/22.  Requested Prescriptions  Pending Prescriptions Disp Refills   amitriptyline (ELAVIL) 10 MG tablet [Pharmacy Med Name: AMITRIPTYLINE HCL 10 MG TAB] 90 tablet 0    Sig: Take 1 tablet (10 mg total) by mouth at bedtime.     Psychiatry:  Antidepressants - Heterocyclics (TCAs) Passed - 07/17/2022 12:26 PM      Passed - Valid encounter within last 6 months    Recent Outpatient Visits           3 months ago PAF (paroxysmal atrial fibrillation) (HCC)   [place] [facility] [place], Dorie Rank, NP   9 months ago Scratch of hand, unspecified laterality, initial encounter   [place] [facility] Mecum, Erin E, PA-C   1 year ago Diabetes mellitus without complication (HCC)   [place] [facility] Vigg, Avanti, MD   1 year ago Chronic low back pain without sciatica, unspecified back pain laterality   [place] [facility] Vigg, Avanti, MD   1 year ago Chronic low back pain without sciatica, unspecified back pain laterality   [place] [facility] Vigg, Avanti, MD       Future Appointments             In 1 month Revankar, Aundra Dubin, MD [place] [facility] [place]   In 2 months Cannady, Dorie Rank, NP [place] [facility], [facility]

## 2022-07-18 DIAGNOSIS — H2513 Age-related nuclear cataract, bilateral: Secondary | ICD-10-CM | POA: Diagnosis not present

## 2022-07-18 DIAGNOSIS — H02403 Unspecified ptosis of bilateral eyelids: Secondary | ICD-10-CM | POA: Diagnosis not present

## 2022-09-05 DIAGNOSIS — N200 Calculus of kidney: Secondary | ICD-10-CM | POA: Insufficient documentation

## 2022-09-10 ENCOUNTER — Encounter: Payer: Self-pay | Admitting: Cardiology

## 2022-09-10 ENCOUNTER — Ambulatory Visit: Payer: Medicare Other | Attending: Cardiology | Admitting: Cardiology

## 2022-09-10 VITALS — BP 144/72 | HR 58 | Ht 69.0 in | Wt 211.0 lb

## 2022-09-10 DIAGNOSIS — E782 Mixed hyperlipidemia: Secondary | ICD-10-CM

## 2022-09-10 DIAGNOSIS — Z8673 Personal history of transient ischemic attack (TIA), and cerebral infarction without residual deficits: Secondary | ICD-10-CM | POA: Diagnosis not present

## 2022-09-10 DIAGNOSIS — Z951 Presence of aortocoronary bypass graft: Secondary | ICD-10-CM | POA: Diagnosis not present

## 2022-09-10 DIAGNOSIS — I10 Essential (primary) hypertension: Secondary | ICD-10-CM | POA: Insufficient documentation

## 2022-09-10 DIAGNOSIS — I2510 Atherosclerotic heart disease of native coronary artery without angina pectoris: Secondary | ICD-10-CM

## 2022-09-10 DIAGNOSIS — Z6830 Body mass index (BMI) 30.0-30.9, adult: Secondary | ICD-10-CM

## 2022-09-10 NOTE — Patient Instructions (Signed)
Medication Instructions:  Your physician recommends that you continue on your current medications as directed. Please refer to the Current Medication list given to you today.  *If you need a refill on your cardiac medications before your next appointment, please call your pharmacy*   Lab Work: None Ordered If you have labs (blood work) drawn today and your tests are completely normal, you will receive your results only by: MyChart Message (if you have MyChart) OR A paper copy in the mail If you have any lab test that is abnormal or we need to change your treatment, we will call you to review the results.   Testing/Procedures: None Ordered   Follow-Up: At [facility], you and your health needs are our priority.  As part of our continuing mission to provide you with exceptional heart care, we have created designated Provider Care Teams.  These Care Teams include your primary Cardiologist (physician) and Advanced Practice Providers (APPs -  Physician Assistants and Nurse Practitioners) who all work together to provide you with the care you need, when you need it.  We recommend signing up for the patient portal called "MyChart".  Sign up information is provided on this After Visit Summary.  MyChart is used to connect with patients for Virtual Visits (Telemedicine).  Patients are able to view lab/test results, encounter notes, upcoming appointments, etc.  Non-urgent messages can be sent to your provider as well.   To learn more about what you can do with MyChart, go to [email or web address].    Your next appointment:   12 month(s)  The format for your next appointment:   In Person  Provider:   Rajan Revankar, MD    Other Instructions NA  

## 2022-09-10 NOTE — Progress Notes (Signed)
Cardiology Office Note:    Date:  09/10/2022   ID:  Thomas Miller, DOB January 30, 1950, MRN [id number]  PCP:  Marjie Skiff, NP  Cardiologist:  Garwin Brothers, MD   Referring MD: Marjie Skiff, NP    ASSESSMENT:    1. Coronary artery disease involving native coronary artery of native heart without angina pectoris   2. History of stroke   3. Mixed hyperlipidemia   4. S/P CABG x 4   5. Body mass index (BMI) 30.0-30.9, adult   6. Essential hypertension    PLAN:    In order of problems listed above:  Coronary artery disease: Secondary prevention stressed with patient.  Importance of compliance with diet medication stressed and he vocalized understanding.  He was advised to walk at least half an hour a day 5 days a week and he promises to do so. Essential hypertension: Blood pressure stable and diet was emphasized.  His blood pressures at home are in the range of 120/70. Mixed dyslipidemia: On lipid-lowering medications followed by primary care.  Lipids done in March and reviewed.  He will get them checked in the next few weeks and send a copy to [place]. Obesity: Weight reduction stressed and diet emphasized.  Risks of obesity explained. Patient will be seen in follow-up appointment in 12 months or earlier if the patient has any concerns.    Medication Adjustments/Labs and Tests Ordered: Current medicines are reviewed at length with the patient today.  Concerns regarding medicines are outlined above.  No orders of the defined types were placed in this encounter.  No orders of the defined types were placed in this encounter.    No chief complaint on file.    History of Present Illness:    Thomas Miller is a 72 y.o. male.  Patient has past medical history of coronary artery disease post CABG surgery, essential hypertension, mixed dyslipidemia and obesity.  He walks extensively on a regular basis he tells me that he walks about 2 miles a day on a regular basis and plays golf.   No chest pain orthopnea or PND.  At the time of my evaluation, the patient is alert awake oriented and in no distress.  Past Medical History:  Diagnosis Date   Advanced care planning/counseling discussion 11/27/2016   Aortic atherosclerosis (HCC) 03/24/2020   Noted on CT 03/01/19   Body mass index (BMI) 30.0-30.9, adult 12/11/2020   BPH (benign prostatic hyperplasia) 10/25/2014   CAD (coronary artery disease) 08/07/2017   Chronic low back pain without sciatica 11/21/2020   Elevated hemoglobin A1c measurement 04/06/2022   Essential (primary) hypertension 10/25/2014   GERD without esophagitis 10/25/2014   Gout    Hearing loss    History of kidney stones    History of stroke 10/05/2019   Hyperlipemia 10/25/2014   Insomnia 05/19/2016   Kidney stones    PAF (paroxysmal atrial fibrillation) (HCC) 08/07/2017   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   S/P epidural steroid injection 05/2018    Past Surgical History:  Procedure Laterality Date   BACK SURGERY     CORONARY ARTERY BYPASS GRAFT N/A 07/20/2017   Procedure: CORONARY ARTERY BYPASS GRAFTING (CABG) times four using left internal mammary artery to LAD and endoscopically harvested right saphenous vein graft to distal circumflex, intermediate, and PD;  Surgeon: [name], MD;  Location: [facility] OR;  Service: Open Heart Surgery;  Laterality: N/A;   EPIDURAL BLOCK INJECTION  05/2018   L3injections  EYE SURGERY Bilateral 2013   FOOT SURGERY Bilateral    ingrown toenails   GALLBLADDER SURGERY     LEFT HEART CATH AND CORONARY ANGIOGRAPHY N/A 07/17/2017   Procedure: LEFT HEART CATH AND CORONARY ANGIOGRAPHY;  Surgeon: Yvonne Kendall, MD;  Location: [facility] INVASIVE CV LAB;  Service: Cardiovascular;  Laterality: N/A;   SHOULDER SURGERY Right    SPINE SURGERY      Current Medications: Current Meds  Medication Sig   allopurinol (ZYLOPRIM) 300 MG tablet Take 1 tablet (300 mg total) by mouth daily.   [place] Cider Vinegar 600 MG CAPS Take 600 mg by mouth  daily.   aspirin EC 81 MG tablet Take 2 tablets (162 mg total) by mouth daily.   atorvastatin (LIPITOR) 80 MG tablet Take 1 tablet (80 mg total) by mouth daily at 6 PM.   esomeprazole (NEXIUM) 40 MG capsule Take 1 capsule (40 mg total) by mouth daily before breakfast.   gabapentin (NEURONTIN) 100 MG capsule Take 1 capsule (100 mg total) by mouth at bedtime.   Liniments (DEEP BLUE RELIEF) GEL Apply 1 application topically as needed for pain (joints). In joints   losartan (COZAAR) 25 MG tablet Take 1 tablet (25 mg total) by mouth daily.   metoprolol tartrate (LOPRESSOR) 25 MG tablet Take 1 tablet (25 mg total) by mouth 2 (two) times daily.   nitroGLYCERIN (NITROSTAT) 0.4 MG SL tablet Place 1 tablet (0.4 mg total) under the tongue every 5 (five) minutes as needed for chest pain.   VITAMIN D, CHOLECALCIFEROL, PO Take 2,000 mg by mouth daily.     Allergies:   Doxycycline, Ace inhibitors, Codeine, and Naprosyn [naproxen]   Social History   Socioeconomic History   Marital status: Married    Spouse name: Not on file   Number of children: Not on file   Years of education: Not on file   Highest education level: Associate degree: occupational, Scientist, product/process development, or vocational program  Occupational History   Occupation: retired  Tobacco Use   Smoking status: Never   Smokeless tobacco: Never  Vaping Use   Vaping status: Never Used  Substance and Sexual Activity   Alcohol use: Not Currently   Drug use: No   Sexual activity: Yes  Other Topics Concern   Not on file  Social History Narrative   Volunteers and golfs    Social Determinants of Health   Financial Resource Strain: Low Risk  (11/05/2020)   Overall Financial Resource Strain (CARDIA)    Difficulty of Paying Living Expenses: Not hard at all  Food Insecurity: No Food Insecurity (05/19/2022)   Hunger Vital Sign    Worried About Running Out of Food in the Last Year: Never true    Ran Out of Food in the Last Year: Never true  Transportation  Needs: No Transportation Needs (05/19/2022)   PRAPARE - Administrator, Civil Service (Medical): No    Lack of Transportation (Non-Medical): No  Physical Activity: Sufficiently Active (11/05/2020)   Exercise Vital Sign    Days of Exercise per Week: 7 days    Minutes of Exercise per Session: 60 min  Stress: No Stress Concern Present (11/05/2020)   [employer] of Occupational Health - Occupational Stress Questionnaire    Feeling of Stress : Not at all  Social Connections: Socially Integrated (11/06/2021)   Social Connection and Isolation Panel [NHANES]    Frequency of Communication with Friends and Family: More than three times a week    Frequency of  Social Gatherings with Friends and Family: More than three times a week    Attends Religious Services: More than 4 times per year    Active Member of [employer] or Organizations: Yes    Attends Engineer, structural: More than 4 times per year    Marital Status: Married     Family History: The patient's family history includes Diabetes in his mother; Lung cancer in his father; Stroke in his paternal grandfather.  ROS:   Please see the history of present illness.    All other systems reviewed and are negative.  EKGs/Labs/Other Studies Reviewed:    The following studies were reviewed today: I discussed my findings with the patient at length.   Recent Labs: 04/09/2022: ALT 22; BUN 14; Creatinine, Ser 1.06; Hemoglobin 16.0; Magnesium 2.0; Platelets 172; Potassium 4.4; Sodium 140; TSH 2.310  Recent Lipid Panel    Component Value Date/Time   CHOL 113 04/09/2022 0827   CHOL 142 06/03/2017 0811   TRIG 70 04/09/2022 0827   TRIG 123 06/03/2017 0811   HDL 44 04/09/2022 0827   CHOLHDL 3.3 10/09/2021 0933   CHOLHDL 3.0 07/18/2017 0249   VLDL 10 07/18/2017 0249   VLDL 25 06/03/2017 0811   LDLCALC 54 04/09/2022 0827    Physical Exam:    VS:  BP (!) 144/72   Pulse (!) 58   Ht 5\' 9"  (1.753 m)   Wt 211 lb (95.7 kg)    SpO2 97%   BMI 31.16 kg/m     Wt Readings from Last 3 Encounters:  09/10/22 211 lb (95.7 kg)  04/09/22 214 lb 4.8 oz (97.2 kg)  11/29/21 206 lb 3.2 oz (93.5 kg)     GEN: Patient is in no acute distress HEENT: Normal NECK: No JVD; No carotid bruits LYMPHATICS: No lymphadenopathy CARDIAC: Hear sounds regular, 2/6 systolic murmur at the apex. RESPIRATORY:  Clear to auscultation without rales, wheezing or rhonchi  ABDOMEN: Soft, non-tender, non-distended MUSCULOSKELETAL:  No edema; No deformity  SKIN: Warm and dry NEUROLOGIC:  Alert and oriented x 3 PSYCHIATRIC:  Normal affect   Signed, Garwin Brothers, MD  09/10/2022 8:17 AM    [facility]

## 2022-10-06 ENCOUNTER — Other Ambulatory Visit: Payer: Self-pay | Admitting: Nurse Practitioner

## 2022-10-06 DIAGNOSIS — E7800 Pure hypercholesterolemia, unspecified: Secondary | ICD-10-CM

## 2022-10-07 NOTE — Telephone Encounter (Signed)
Requested Prescriptions  Refused Prescriptions Disp Refills   atorvastatin (LIPITOR) 80 MG tablet [Pharmacy Med Name: ATORVASTATIN 80 MG TABLET] 90 tablet 0    Sig: Take 1 tablet (80 mg total) by mouth daily at 6 PM.     Cardiovascular:  Antilipid - Statins Failed - 10/06/2022 11:45 AM      Failed - Lipid Panel in normal range within the last 12 months    Cholesterol, Total  Date Value Ref Range Status  04/09/2022 113 100 - 199 mg/dL Final   Cholesterol Piccolo, Waived  Date Value Ref Range Status  06/03/2017 142 <200 mg/dL Final    Comment:                            Desirable                <200                         Borderline High      200- 239                         High                     >239    LDL Chol Calc (NIH)  Date Value Ref Range Status  04/09/2022 54 0 - 99 mg/dL Final   HDL  Date Value Ref Range Status  04/09/2022 44 >39 mg/dL Final   Triglycerides  Date Value Ref Range Status  04/09/2022 70 0 - 149 mg/dL Final   Triglycerides Piccolo,Waived  Date Value Ref Range Status  06/03/2017 123 <150 mg/dL Final    Comment:                            Normal                   <150                         Borderline High     150 - 199                         High                200 - 499                         Very High                >499          Passed - Patient is not pregnant      Passed - Valid encounter within last 12 months    Recent Outpatient Visits           6 months ago PAF (paroxysmal atrial fibrillation) (HCC)   [place] [facility] Holly, Corrie Dandy T, NP   12 months ago Scratch of hand, unspecified laterality, initial encounter   [place] [facility] Mecum, Erin E, PA-C   1 year ago Diabetes mellitus without complication (HCC)   [place] [facility] Vigg, Avanti, MD   1 year ago Chronic low back pain without sciatica, unspecified back pain laterality    [facility]  Vigg, Avanti, MD   1 year ago Chronic low back pain without sciatica, unspecified back pain laterality   [place] [facility] Vigg, Avanti, MD       Future Appointments             In 6 days Cannady, Dorie Rank, NP [facility]

## 2022-10-11 NOTE — Patient Instructions (Signed)
Be Involved in Caring For Your Health:  Taking Medications When medications are taken as directed, they can greatly improve your health. But if they are not taken as prescribed, they may not work. In some cases, not taking them correctly can be harmful. To help ensure your treatment remains effective and safe, understand your medications and how to take them. Bring your medications to each visit for review by your provider.  Your lab results, notes, and after visit summary will be available on My Chart. We strongly encourage you to use this feature. If lab results are abnormal the clinic will contact you with the appropriate steps. If the clinic does not contact you assume the results are satisfactory. You can always view your results on My Chart. If you have questions regarding your health or results, please contact the clinic during office hours. You can also ask questions on My Chart.  We at [facility] are grateful that you chose [place] to provide your care. We strive to provide evidence-based and compassionate care and are always looking for feedback. If you get a survey from the clinic please complete this so we can hear your opinions.  Atrial Fibrillation Atrial fibrillation (AFib) is a type of heartbeat that is irregular or fast. If you have AFib, your heart beats without any order. This makes it hard for your heart to pump blood in a normal way. AFib may come and go, or it may become a long-lasting problem. If AFib is not treated, it can put you at higher risk for stroke, heart failure, and other heart problems. What are the causes? AFib may be caused by diseases that damage the heart's electrical system. They include: High blood pressure. Heart failure. Heart valve diseases. Heart surgery. Diabetes. Thyroid disease. Kidney disease. Lung diseases, such as pneumonia or COPD. Sleep apnea. Sometimes the cause is not known. What increases the risk? You are more likely to  develop AFib if: You are older. You exercise often and very hard. You have a family history of AFib. You are male. You are Caucasian. You are overweight. You smoke. You drink a lot of alcohol. What are the signs or symptoms? Common symptoms of this condition include: A feeling that your heart is beating very fast. Chest pain or discomfort. Feeling short of breath. Suddenly feeling light-headed or weak. Getting tired easily during activity. Fainting. Sweating. In some cases, there are no symptoms. How is this treated? Medicines to: Prevent blood clots. Treat heart rate or heart rhythm problems. Using devices, such as a pacemaker, to correct heart rhythm problems. Doing surgery to remove the part of the heart that sends bad signals. Closing an area where clots can form in the heart (left atrial appendage). In some cases, your doctor will treat other underlying conditions. Follow these instructions at home: Medicines Take over-the-counter and prescription medicines only as told by your doctor. Do not take any new medicines without first talking to your doctor. If you are taking blood thinners: Talk with your doctor before taking aspirin or NSAIDs, such as ibuprofen. Take your medicines as told. Take them at the same time each day. Do not do things that could hurt or bruise you. Be careful to avoid falls. Wear an alert bracelet or carry a card that says you take blood thinners. Lifestyle Do not smoke or use any products that contain nicotine or tobacco. If you need help quitting, ask your doctor. Eat heart-healthy foods. Talk with your doctor about the right eating plan  for you. Exercise regularly as told by your doctor. Do not drink alcohol. Lose weight if you are overweight. General instructions If you have sleep apnea, treat it as told by your doctor. Do not use diet pills unless your doctor says they are safe for you. Diet pills may make heart problems worse. Keep all  follow-up visits. Your doctor will check your heart rate and rhythm regularly. Contact a doctor if: You notice a change in the speed, rhythm, or strength of your heartbeat. You are taking a blood-thinning medicine and you get more bruising. You get tired more easily when you move or exercise. You have a sudden change in weight. Get help right away if:  You have pain in your chest. You have trouble breathing. You have side effects of blood thinners, such as blood in your vomit, poop (stool), or pee (urine), or bleeding that cannot stop. You have any signs of a stroke. "BE FAST" is an easy way to remember the main warning signs: B - Balance. Dizziness, sudden trouble walking, or loss of balance. E - Eyes. Trouble seeing or a change in how you see. F - Face. Sudden weakness or loss of feeling in the face. The face or eyelid may droop on one side. A - Arms.Weakness or loss of feeling in an arm. This happens suddenly and usually on one side of the body. S - Speech. Sudden trouble speaking, slurred speech, or trouble understanding what people say. T - Time.Time to call emergency services. Write down what time symptoms started. You have other signs of a stroke, such as: A sudden, very bad headache with no known cause. Feeling like you may vomit (nausea). Vomiting. A seizure. These symptoms may be an emergency. Get help right away. Call 911. Do not wait to see if the symptoms will go away. Do not drive yourself to the hospital. This information is not intended to replace advice given to you by your health care provider. Make sure you discuss any questions you have with your health care provider. Document Revised: 10/02/2021 Document Reviewed: 10/02/2021 Elsevier Patient Education  2024 [employer].

## 2022-10-13 ENCOUNTER — Ambulatory Visit (INDEPENDENT_AMBULATORY_CARE_PROVIDER_SITE_OTHER): Payer: Medicare Other | Admitting: Nurse Practitioner

## 2022-10-13 ENCOUNTER — Encounter: Payer: Self-pay | Admitting: Nurse Practitioner

## 2022-10-13 VITALS — BP 126/74 | HR 52 | Temp 98.1°F | Ht 69.5 in | Wt 210.8 lb

## 2022-10-13 DIAGNOSIS — I480 Paroxysmal atrial fibrillation: Secondary | ICD-10-CM | POA: Diagnosis not present

## 2022-10-13 DIAGNOSIS — Z6830 Body mass index (BMI) 30.0-30.9, adult: Secondary | ICD-10-CM

## 2022-10-13 DIAGNOSIS — Z1211 Encounter for screening for malignant neoplasm of colon: Secondary | ICD-10-CM

## 2022-10-13 DIAGNOSIS — M1A0710 Idiopathic chronic gout, right ankle and foot, without tophus (tophi): Secondary | ICD-10-CM | POA: Diagnosis not present

## 2022-10-13 DIAGNOSIS — I10 Essential (primary) hypertension: Secondary | ICD-10-CM

## 2022-10-13 DIAGNOSIS — K219 Gastro-esophageal reflux disease without esophagitis: Secondary | ICD-10-CM

## 2022-10-13 DIAGNOSIS — E782 Mixed hyperlipidemia: Secondary | ICD-10-CM | POA: Diagnosis not present

## 2022-10-13 DIAGNOSIS — Z8673 Personal history of transient ischemic attack (TIA), and cerebral infarction without residual deficits: Secondary | ICD-10-CM

## 2022-10-13 DIAGNOSIS — Z0000 Encounter for general adult medical examination without abnormal findings: Secondary | ICD-10-CM

## 2022-10-13 DIAGNOSIS — I700 Atherosclerosis of aorta: Secondary | ICD-10-CM | POA: Diagnosis not present

## 2022-10-13 DIAGNOSIS — I25118 Atherosclerotic heart disease of native coronary artery with other forms of angina pectoris: Secondary | ICD-10-CM

## 2022-10-13 DIAGNOSIS — R7309 Other abnormal glucose: Secondary | ICD-10-CM | POA: Diagnosis not present

## 2022-10-13 DIAGNOSIS — N400 Enlarged prostate without lower urinary tract symptoms: Secondary | ICD-10-CM

## 2022-10-13 MED ORDER — LOSARTAN POTASSIUM 25 MG PO TABS: 25.0000 mg | ORAL_TABLET | Freq: Every day | ORAL | 4 refills | Status: DC

## 2022-10-13 MED ORDER — ESOMEPRAZOLE MAGNESIUM 40 MG PO CPDR
40.0000 mg | DELAYED_RELEASE_CAPSULE | Freq: Every day | ORAL | 4 refills | Status: DC
Start: 2022-10-13 — End: 2023-10-23

## 2022-10-13 MED ORDER — ALLOPURINOL 300 MG PO TABS
300.0000 mg | ORAL_TABLET | Freq: Every day | ORAL | 4 refills | Status: DC
Start: 2022-10-13 — End: 2023-11-27

## 2022-10-13 MED ORDER — ATORVASTATIN CALCIUM 80 MG PO TABS
80.0000 mg | ORAL_TABLET | Freq: Every day | ORAL | 4 refills | Status: DC
Start: 2022-10-13 — End: 2023-11-27

## 2022-10-13 MED ORDER — GABAPENTIN 100 MG PO CAPS: 100.0000 mg | ORAL_CAPSULE | Freq: Every day | ORAL | 4 refills | Status: DC

## 2022-10-13 NOTE — Assessment & Plan Note (Signed)
A1c 6% last check, monitor closely and continue diet/exercise focus at home.  Discussed at length with patient.  Recheck A1c today.

## 2022-10-13 NOTE — Progress Notes (Signed)
BP 126/74   Pulse (!) 52   Temp 98.1 F (36.7 C) (Oral)   Ht 5' 9.5" (1.765 m)   Wt 210 lb 12.8 oz (95.6 kg)   SpO2 97%   BMI 30.68 kg/m    Subjective:    Patient ID: Thomas Miller, male    DOB: 04/05/1950, 72 y.o.   MRN: [id number]  HPI: Thomas Miller is a 72 y.o. male presenting on 10/13/2022 for comprehensive medical examination. Current medical complaints include:none  He currently lives with: self Interim Problems from his last visit: no  Continues on Nexium for GERD, well controlled, and Allopurinol for gout with no recent flares.  HYPERTENSION / HYPERLIPIDEMIA + CAD Saw cardiology on 09/10/22, no changes.  Used NTG yesterday due to some sharp pains, took this and 10 minutes later pain subsided.   Last time he had to take was March 2024.   Continues on Losartan, Metoprolol, Lipitor.  History of several mini strokes per patient. Satisfied with current treatment? yes Duration of hypertension: chronic BP monitoring frequency: daily BP range: <118-125/68/78 BP medication side effects: no Duration of hyperlipidemia: chronic Cholesterol medication side effects: no Cholesterol supplements: none Medication compliance: good compliance Aspirin: yes Recent stressors: no Recurrent headaches: no Visual changes: no Palpitations: no Dyspnea: no  Chest pain: yes -- relieved with NTG Lower extremity edema: no Dizzy/lightheaded: no    ATRIAL FIBRILLATION Currently not on anticoagulant. Does take Baby ASA. Atrial fibrillation status: stable Satisfied with current treatment: yes  Medication side effects:  no Medication compliance: good compliance Etiology of atrial fibrillation:  Palpitations:  no Chest pain:  as above Dyspnea on exertion:  no Orthopnea:  no Syncope:  no Edema:  no Ventricular rate control: B-blocker Anti-coagulation:  Baby ASA   PREDIABETES A1c in September was 6%. Has cut back on sweets. Polydipsia/polyuria: no Visual disturbance: no Chest pain:  as above Paresthesias: no  Functional Status Survey: Is the patient deaf or have difficulty hearing?: No Does the patient have difficulty seeing, even when wearing glasses/contacts?: No Does the patient have difficulty concentrating, remembering, or making decisions?: No Does the patient have difficulty walking or climbing stairs?: No Does the patient have difficulty dressing or bathing?: No Does the patient have difficulty doing errands alone such as visiting a doctor's office or shopping?: No  FALL RISK:    10/13/2022    8:42 AM 04/09/2022    8:18 AM 11/06/2021    8:35 AM 04/08/2021    9:27 AM 01/07/2021    9:39 AM  Fall Risk   Falls in the past year? 0 0 0 0 0  Number falls in past yr: 0 0 0 0 0  Injury with Fall? 0 0 0 0 0  Risk for fall due to : No Fall Risks No Fall Risks  No Fall Risks No Fall Risks  Follow up Falls evaluation completed Falls evaluation completed Falls evaluation completed;Education provided;Falls prevention discussed Falls evaluation completed Falls evaluation completed   Depression Screen    10/13/2022    8:42 AM 04/09/2022    8:20 AM 11/06/2021    8:39 AM 04/08/2021    9:27 AM 01/07/2021    9:45 AM  Depression screen PHQ 2/9  Decreased Interest 0 0 0 0 0  Down, Depressed, Hopeless 0 0 0 0 0  PHQ - 2 Score 0 0 0 0 0  Altered sleeping 0 0 2 0 3  Tired, decreased energy 0 0 0 0 0  Change in  appetite 0 0 0 0 0  Feeling bad or failure about yourself  0 0 0 0 0  Trouble concentrating 0 0 0 0 0  Moving slowly or fidgety/restless 0 0 0 0 0  Suicidal thoughts 0 0 0 0 0  PHQ-9 Score 0 0 2 0 3  Difficult doing work/chores Not difficult at all Not difficult at all Not difficult at all Not difficult at all Not difficult at all      10/13/2022    8:44 AM 04/09/2022    8:20 AM 04/08/2021    9:28 AM 01/07/2021    9:45 AM  GAD 7 : Generalized Anxiety Score  Nervous, Anxious, on Edge 0 0 0 0  Control/stop worrying 0 0 0 0  Worry too much - different things 0 0  0 0  Trouble relaxing 0 0 0 0  Restless 0 0 0 0  Easily annoyed or irritable 0 0 0 0  Afraid - awful might happen 0 0 0 0  Total GAD 7 Score 0 0 0 0  Anxiety Difficulty Not difficult at all Not difficult at all Not difficult at all     Past Medical History:  Past Medical History:  Diagnosis Date   Advanced care planning/counseling discussion 11/27/2016   Allergy 1986   Aortic atherosclerosis (HCC) 03/24/2020   Noted on CT 03/01/19   Arthritis 1992   Body mass index (BMI) 30.0-30.9, adult 12/11/2020   BPH (benign prostatic hyperplasia) 10/25/2014   CAD (coronary artery disease) 08/07/2017   Cataract 2019   Chronic low back pain without sciatica 11/21/2020   Elevated hemoglobin A1c measurement 04/06/2022   Essential (primary) hypertension 10/25/2014   GERD without esophagitis 10/25/2014   Gout    Hearing loss    History of kidney stones    History of stroke 10/05/2019   Hyperlipemia 10/25/2014   Insomnia 05/19/2016   Kidney stones    Myocardial infarction (HCC) 2019   PAF (paroxysmal atrial fibrillation) (HCC) 08/07/2017   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   S/P epidural steroid injection 05/2018   Stroke (HCC) 2021    Surgical History:  Past Surgical History:  Procedure Laterality Date   BACK SURGERY     CHOLECYSTECTOMY  1996   CORONARY ARTERY BYPASS GRAFT N/A 07/20/2017   Procedure: CORONARY ARTERY BYPASS GRAFTING (CABG) times four using left internal mammary artery to LAD and endoscopically harvested right saphenous vein graft to distal circumflex, intermediate, and PD;  Surgeon: [name], MD;  Location: [facility];  Service: Open Heart Surgery;  Laterality: N/A;   EPIDURAL BLOCK INJECTION  05/2018   L3injections    EYE SURGERY Bilateral 2013   FOOT SURGERY Bilateral    ingrown toenails   GALLBLADDER SURGERY     LEFT HEART CATH AND CORONARY ANGIOGRAPHY N/A 07/17/2017   Procedure: LEFT HEART CATH AND CORONARY ANGIOGRAPHY;  Surgeon: Yvonne Kendall, MD;  Location: [facility]  INVASIVE CV LAB;  Service: Cardiovascular;  Laterality: N/A;   SHOULDER SURGERY Right    SPINE SURGERY      Medications:  Current Outpatient Medications on File Prior to Visit  Medication Sig   Cillo Cider Vinegar 600 MG CAPS Take 600 mg by mouth daily.   aspirin EC 81 MG tablet Take 2 tablets (162 mg total) by mouth daily.   Liniments (DEEP BLUE RELIEF) GEL Apply 1 application topically as needed for pain (joints). In joints   metoprolol tartrate (LOPRESSOR) 25 MG tablet Take 1 tablet (25 mg  total) by mouth 2 (two) times daily.   nitroGLYCERIN (NITROSTAT) 0.4 MG SL tablet Place 1 tablet (0.4 mg total) under the tongue every 5 (five) minutes as needed for chest pain.   VITAMIN D, CHOLECALCIFEROL, PO Take 2,000 mg by mouth daily.   No current facility-administered medications on file prior to visit.    Allergies:  Allergies  Allergen Reactions   Doxycycline Rash   Ace Inhibitors Cough    "cough"   Codeine Other (See Comments)    Insomnia   Naprosyn [Naproxen] Rash    Social History:  Social History   Socioeconomic History   Marital status: Widowed    Spouse name: Not on file   Number of children: Not on file   Years of education: Not on file   Highest education level: Associate degree: occupational, Scientist, product/process development, or vocational program  Occupational History   Occupation: retired  Tobacco Use   Smoking status: Never   Smokeless tobacco: Never  Vaping Use   Vaping status: Never Used  Substance and Sexual Activity   Alcohol use: Not Currently    Comment: Occasional glass of wine.   Drug use: No   Sexual activity: Yes  Other Topics Concern   Not on file  Social History Narrative   Volunteers and golfs    Social Determinants of Health   Financial Resource Strain: Low Risk  (11/05/2020)   Overall Financial Resource Strain (CARDIA)    Difficulty of Paying Living Expenses: Not hard at all  Food Insecurity: No Food Insecurity (05/19/2022)   Hunger Vital Sign    Worried  About Running Out of Food in the Last Year: Never true    Ran Out of Food in the Last Year: Never true  Transportation Needs: No Transportation Needs (05/19/2022)   PRAPARE - Administrator, Civil Service (Medical): No    Lack of Transportation (Non-Medical): No  Physical Activity: Sufficiently Active (11/05/2020)   Exercise Vital Sign    Days of Exercise per Week: 7 days    Minutes of Exercise per Session: 60 min  Stress: No Stress Concern Present (11/05/2020)   [employer] of Occupational Health - Occupational Stress Questionnaire    Feeling of Stress : Not at all  Social Connections: Socially Integrated (11/06/2021)   Social Connection and Isolation Panel [NHANES]    Frequency of Communication with Friends and Family: More than three times a week    Frequency of Social Gatherings with Friends and Family: More than three times a week    Attends Religious Services: More than 4 times per year    Active Member of [employer] or Organizations: Yes    Attends Engineer, structural: More than 4 times per year    Marital Status: Married  Catering manager Violence: Not At Risk (11/06/2021)   Humiliation, Afraid, Rape, and Kick questionnaire    Fear of Current or Ex-Partner: No    Emotionally Abused: No    Physically Abused: No    Sexually Abused: No   Social History   Tobacco Use  Smoking Status Never  Smokeless Tobacco Never   Social History   Substance and Sexual Activity  Alcohol Use Not Currently   Comment: Occasional glass of wine.    Family History:  Family History  Problem Relation Age of Onset   Diabetes Mother    Lung cancer Father    Cancer Father    Stroke Paternal Grandfather    Cancer Brother    Cancer Sister  Past medical history, surgical history, medications, allergies, family history and social history reviewed with patient today and changes made to appropriate areas of the chart.   ROS All other ROS negative except what is listed  above and in the HPI.      Objective:    BP 126/74   Pulse (!) 52   Temp 98.1 F (36.7 C) (Oral)   Ht 5' 9.5" (1.765 m)   Wt 210 lb 12.8 oz (95.6 kg)   SpO2 97%   BMI 30.68 kg/m   Wt Readings from Last 3 Encounters:  10/13/22 210 lb 12.8 oz (95.6 kg)  09/10/22 211 lb (95.7 kg)  04/09/22 214 lb 4.8 oz (97.2 kg)    Physical Exam Vitals and nursing note reviewed.  Constitutional:      General: He is awake. He is not in acute distress.    Appearance: He is well-developed and well-groomed. He is not ill-appearing or toxic-appearing.  HENT:     Head: Normocephalic and atraumatic.     Right Ear: Hearing, tympanic membrane, ear canal and external ear normal. No drainage.     Left Ear: Hearing, tympanic membrane, ear canal and external ear normal. No drainage.     Nose: Nose normal.     Mouth/Throat:     Pharynx: Uvula midline.  Eyes:     General: Lids are normal.        Right eye: No discharge.        Left eye: No discharge.     Extraocular Movements: Extraocular movements intact.     Conjunctiva/sclera: Conjunctivae normal.     Pupils: Pupils are equal, round, and reactive to light.     Visual Fields: Right eye visual fields normal and left eye visual fields normal.  Neck:     Thyroid: No thyromegaly.     Vascular: No carotid bruit or JVD.     Trachea: Trachea normal.  Cardiovascular:     Rate and Rhythm: Regular rhythm. Bradycardia present.     Heart sounds: Normal heart sounds, S1 normal and S2 normal. No murmur heard.    No gallop.  Pulmonary:     Effort: Pulmonary effort is normal. No accessory muscle usage or respiratory distress.     Breath sounds: Normal breath sounds.  Abdominal:     General: Bowel sounds are normal.     Palpations: Abdomen is soft. There is no hepatomegaly or splenomegaly.     Tenderness: There is no abdominal tenderness.  Musculoskeletal:        General: Normal range of motion.     Cervical back: Normal range of motion and neck supple.      Right lower leg: No edema.     Left lower leg: No edema.  Lymphadenopathy:     Head:     Right side of head: No submental, submandibular, tonsillar, preauricular or posterior auricular adenopathy.     Left side of head: No submental, submandibular, tonsillar, preauricular or posterior auricular adenopathy.     Cervical: No cervical adenopathy.  Skin:    General: Skin is warm and dry.     Capillary Refill: Capillary refill takes less than 2 seconds.     Findings: No rash.  Neurological:     Mental Status: He is alert and oriented to person, place, and time.     Gait: Gait is intact.     Deep Tendon Reflexes: Reflexes are normal and symmetric.     Reflex Scores:  Brachioradialis reflexes are 2+ on the right side and 2+ on the left side.      Patellar reflexes are 2+ on the right side and 2+ on the left side. Psychiatric:        Attention and Perception: Attention normal.        Mood and Affect: Mood normal.        Speech: Speech normal.        Behavior: Behavior normal. Behavior is cooperative.        Thought Content: Thought content normal.        Cognition and Memory: Cognition normal.       11/06/2021    8:36 AM 11/05/2020    8:21 AM 11/04/2019    8:20 AM 11/01/2018    8:17 AM 10/28/2017    8:19 AM  6CIT Screen  What Year? 0 points 0 points 0 points 0 points 0 points  What month? 0 points 0 points 0 points 0 points 0 points  What time? 0 points 0 points 0 points 0 points 0 points  Count back from 20 0 points 0 points 0 points 0 points 0 points  Months in reverse 0 points 0 points 0 points 0 points 0 points  Repeat phrase 0 points 2 points 0 points 0 points 0 points  Total Score 0 points 2 points 0 points 0 points 0 points   Results for orders placed or performed in visit on 04/09/22  Bayer DCA Hb A1c Waived  Result Value Ref Range   HB A1C (BAYER DCA - WAIVED) 6.1 (H) 4.8 - 5.6 %  Microalbumin, Urine Waived  Result Value Ref Range   Microalb, Ur Waived 80 (H) 0 - 19  mg/L   Creatinine, Urine Waived 300 10 - 300 mg/dL   Microalb/Creat Ratio 30-300 (H) <30 mg/g  Comprehensive metabolic panel  Result Value Ref Range   Glucose 96 70 - 99 mg/dL   BUN 14 8 - 27 mg/dL   Creatinine, Ser 1.61 0.76 - 1.27 mg/dL   eGFR 75 >09 UE/AVW/0.98   BUN/Creatinine Ratio 13 10 - 24   Sodium 140 134 - 144 mmol/L   Potassium 4.4 3.5 - 5.2 mmol/L   Chloride 104 96 - 106 mmol/L   CO2 21 20 - 29 mmol/L   Calcium 9.6 8.6 - 10.2 mg/dL   Total Protein 6.4 6.0 - 8.5 g/dL   Albumin 4.6 3.8 - 4.8 g/dL   Globulin, Total 1.8 1.5 - 4.5 g/dL   Albumin/Globulin Ratio 2.6 (H) 1.2 - 2.2   Bilirubin Total 0.7 0.0 - 1.2 mg/dL   Alkaline Phosphatase 93 44 - 121 IU/L   AST 24 0 - 40 IU/L   ALT 22 0 - 44 IU/L  CBC with Differential/Platelet  Result Value Ref Range   WBC 7.0 3.4 - 10.8 x10E3/uL   RBC 5.39 4.14 - 5.80 x10E6/uL   Hemoglobin 16.0 13.0 - 17.7 g/dL   Hematocrit 11.9 14.7 - 51.0 %   MCV 88 79 - 97 fL   MCH 29.7 26.6 - 33.0 pg   MCHC 33.6 31.5 - 35.7 g/dL   RDW 82.9 56.2 - 13.0 %   Platelets 172 150 - 450 x10E3/uL   Neutrophils 52 Not Estab. %   Lymphs 35 Not Estab. %   Monocytes 9 Not Estab. %   Eos 3 Not Estab. %   Basos 1 Not Estab. %   Neutrophils Absolute 3.7 1.4 - 7.0 x10E3/uL   Lymphocytes Absolute  2.4 0.7 - 3.1 x10E3/uL   Monocytes Absolute 0.7 0.1 - 0.9 x10E3/uL   EOS (ABSOLUTE) 0.2 0.0 - 0.4 x10E3/uL   Basophils Absolute 0.1 0.0 - 0.2 x10E3/uL   Immature Granulocytes 0 Not Estab. %   Immature Grans (Abs) 0.0 0.0 - 0.1 x10E3/uL  Lipid Panel w/o Chol/HDL Ratio  Result Value Ref Range   Cholesterol, Total 113 100 - 199 mg/dL   Triglycerides 70 0 - 149 mg/dL   HDL 44 >16 mg/dL   VLDL Cholesterol Cal 15 5 - 40 mg/dL   LDL Chol Calc (NIH) 54 0 - 99 mg/dL  TSH  Result Value Ref Range   TSH 2.310 0.450 - 4.500 uIU/mL  Magnesium  Result Value Ref Range   Magnesium 2.0 1.6 - 2.3 mg/dL  Uric acid  Result Value Ref Range   Uric Acid 4.2 3.8 - 8.4 mg/dL       Assessment & Plan:   Problem List Items Addressed This Visit       Cardiovascular and Mediastinum   Aortic atherosclerosis (HCC)    Ongoing.  Noted on CT 03/01/2019 -- continue statin daily for prevention + ASA.      Relevant Medications   losartan (COZAAR) 25 MG tablet   atorvastatin (LIPITOR) 80 MG tablet   Other Relevant Orders   Comprehensive metabolic panel   Lipid Panel w/o Chol/HDL Ratio   CAD (coronary artery disease)    Chronic, stable.  Recent NTG use yesterday which resolved CP -- monitor closely and if increased use to alert PCP or cardiology.  Continue collaboration with cardiology, recent notes reviewed.      Relevant Medications   losartan (COZAAR) 25 MG tablet   atorvastatin (LIPITOR) 80 MG tablet   Other Relevant Orders   Comprehensive metabolic panel   Lipid Panel w/o Chol/HDL Ratio   Essential (primary) hypertension    Chronic, stable.  BP at goal on exam and on home checks.  Recommend he continue to monitor BP at home daily + document and bring to provider visits.  Continue current medication regimen at this time and adjust as needed. LAB: CBC, CMP.  Focus on DASH diet at home.  Refills sent.  Return in 6 months.        Relevant Medications   losartan (COZAAR) 25 MG tablet   atorvastatin (LIPITOR) 80 MG tablet   Other Relevant Orders   Comprehensive metabolic panel   CBC with Differential/Platelet   PAF (paroxysmal atrial fibrillation) (HCC) - Primary    Chronic, followed by cardiology.  On BB and Baby ASA.  Continue current medication regimen as recommended by them.        Relevant Medications   losartan (COZAAR) 25 MG tablet   atorvastatin (LIPITOR) 80 MG tablet   Other Relevant Orders   Comprehensive metabolic panel     Digestive   GERD without esophagitis    Chronic, stable on daily Nexium.  Continue current regimen and adjust as needed.  Mag level annually.      Relevant Medications   esomeprazole (NEXIUM) 40 MG capsule      Genitourinary   BPH (benign prostatic hyperplasia)    Asymptomatic with no current medications.  Will check PSA level today, discussed with patient -- he wishes to continue screening.      Relevant Orders   PSA     Other   Body mass index (BMI) 30.0-30.9, adult    BMI 30.68. Recommended eating smaller high protein, low fat meals more  frequently and exercising 30 mins a day 5 times a week with a goal of 10-15lb weight loss in the next 3 months. Patient voiced their understanding and motivation to adhere to these recommendations.       Elevated hemoglobin A1c measurement    A1c 6% last check, monitor closely and continue diet/exercise focus at home.  Discussed at length with patient.  Recheck A1c today.      Relevant Orders   HgB A1c   Gout    Chronic, stable on Allopurinol with recent kidney function and uric acid level up to date.  Continue current regimen and adjust as needed.  No recent flares.      Relevant Medications   allopurinol (ZYLOPRIM) 300 MG tablet   Hyperlipemia    Chronic, ongoing.  Continue current medication regimen and adjust as needed.  Lipid panel today.      Relevant Medications   losartan (COZAAR) 25 MG tablet   atorvastatin (LIPITOR) 80 MG tablet   Other Relevant Orders   Comprehensive metabolic panel   Lipid Panel w/o Chol/HDL Ratio   Other Visit Diagnoses     Screening for colon cancer       Ordered Cologuard.   Relevant Orders   Cologuard   Encounter for annual physical exam       Annual physical today with labs and health maintenance reviewed, discussed with patient.       Discussed aspirin prophylaxis for myocardial infarction prevention and decision was made to continue ASA  LABORATORY TESTING:  Health maintenance labs ordered today as discussed above.   The natural history of prostate cancer and ongoing controversy regarding screening and potential treatment outcomes of prostate cancer has been discussed with the patient. The meaning  of a false positive PSA and a false negative PSA has been discussed. He indicates understanding of the limitations of this screening test and wishes to proceed with screening PSA testing.  Is aware of recommendations not to continue screening after age 94.  IMMUNIZATIONS:   - Tdap: Tetanus vaccination status reviewed: last tetanus booster within 10 years. - Influenza: Refused - Pneumovax: Up to date - Prevnar: Up to date - Zostavax vaccine:  will obtain at drugstore  SCREENING: - Colonoscopy: Ordered today  Cologuard Discussed with patient purpose of the colonoscopy is to detect colon cancer at curable precancerous or early stages   - AAA Screening: Not applicable  -Hearing Test: Not applicable  -Spirometry: Not applicable   PATIENT COUNSELING:    Sexuality: Discussed sexually transmitted diseases, partner selection, use of condoms, avoidance of unintended pregnancy  and contraceptive alternatives.   Advised to avoid cigarette smoking.  I discussed with the patient that most people either abstain from alcohol or drink within safe limits (<=14/week and <=4 drinks/occasion for males, <=7/weeks and <= 3 drinks/occasion for females) and that the risk for alcohol disorders and other health effects rises proportionally with the number of drinks per week and how often a drinker exceeds daily limits.  Discussed cessation/primary prevention of drug use and availability of treatment for abuse.   Diet: Encouraged to adjust caloric intake to maintain  or achieve ideal body weight, to reduce intake of dietary saturated fat and total fat, to limit sodium intake by avoiding high sodium foods and not adding table salt, and to maintain adequate dietary potassium and calcium preferably from fresh fruits, vegetables, and low-fat dairy products.    Stressed the importance of regular exercise  Injury prevention: Discussed safety belts, safety helmets,  smoke detector, smoking near bedding or upholstery.    Dental health: Discussed importance of regular tooth brushing, flossing, and dental visits.   Follow up plan: NEXT PREVENTATIVE PHYSICAL DUE IN 1 YEAR. Return in about 6 months (around 04/12/2023) for A-FIB, HTN/HLD, GOUT, GERD + Needs Medicare Well with nurse after 11/07/22.

## 2022-10-13 NOTE — Assessment & Plan Note (Signed)
BMI 30.68.  Recommended eating smaller high protein, low fat meals more frequently and exercising 30 mins a day 5 times a week with a goal of 10-15lb weight loss in the next 3 months. Patient voiced their understanding and motivation to adhere to these recommendations. ? ?

## 2022-10-13 NOTE — Assessment & Plan Note (Signed)
Chronic, followed by cardiology.  On BB and Baby ASA.  Continue current medication regimen as recommended by them.

## 2022-10-13 NOTE — Assessment & Plan Note (Signed)
Ongoing.  Noted on CT 03/01/2019 -- continue statin daily for prevention + ASA.

## 2022-10-13 NOTE — Assessment & Plan Note (Addendum)
Chronic, stable on Allopurinol with recent kidney function and uric acid level up to date.  Continue current regimen and adjust as needed.  No recent flares.

## 2022-10-13 NOTE — Assessment & Plan Note (Signed)
Asymptomatic with no current medications.  Will check PSA level today, discussed with patient -- he wishes to continue screening.

## 2022-10-13 NOTE — Assessment & Plan Note (Signed)
Chronic, stable on daily Nexium.  Continue current regimen and adjust as needed.  Mag level annually.

## 2022-10-13 NOTE — Assessment & Plan Note (Signed)
Chronic, stable.  Recent NTG use yesterday which resolved CP -- monitor closely and if increased use to alert PCP or cardiology.  Continue collaboration with cardiology, recent notes reviewed.

## 2022-10-13 NOTE — Assessment & Plan Note (Signed)
Chronic, ongoing.  Continue current medication regimen and adjust as needed. Lipid panel today.

## 2022-10-13 NOTE — Assessment & Plan Note (Signed)
Chronic, stable.  BP at goal on exam and on home checks.  Recommend he continue to monitor BP at home daily + document and bring to provider visits.  Continue current medication regimen at this time and adjust as needed. LAB: CBC, CMP.  Focus on DASH diet at home.  Refills sent.  Return in 6 months.

## 2022-10-14 LAB — COMPREHENSIVE METABOLIC PANEL
ALT: 22 IU/L (ref 0–44)
AST: 21 IU/L (ref 0–40)
Albumin: 4.8 g/dL (ref 3.8–4.8)
Alkaline Phosphatase: 100 IU/L (ref 44–121)
BUN/Creatinine Ratio: 12 (ref 10–24)
BUN: 13 mg/dL (ref 8–27)
Bilirubin Total: 0.7 mg/dL (ref 0.0–1.2)
CO2: 22 mmol/L (ref 20–29)
Calcium: 9.8 mg/dL (ref 8.6–10.2)
Chloride: 103 mmol/L (ref 96–106)
Creatinine, Ser: 1.11 mg/dL (ref 0.76–1.27)
Globulin, Total: 2.4 g/dL (ref 1.5–4.5)
Glucose: 91 mg/dL (ref 70–99)
Potassium: 4.8 mmol/L (ref 3.5–5.2)
Sodium: 141 mmol/L (ref 134–144)
Total Protein: 7.2 g/dL (ref 6.0–8.5)
eGFR: 70 mL/min/{1.73_m2} (ref >59)

## 2022-10-14 LAB — CBC WITH DIFFERENTIAL/PLATELET
Basophils Absolute: 0.1 10*3/uL (ref 0.0–0.2)
Basos: 1 %
EOS (ABSOLUTE): 0.2 10*3/uL (ref 0.0–0.4)
Eos: 2 %
Hematocrit: 50.4 % (ref 37.5–51.0)
Hemoglobin: 16.7 g/dL (ref 13.0–17.7)
Immature Grans (Abs): 0 10*3/uL (ref 0.0–0.1)
Immature Granulocytes: 0 %
Lymphocytes Absolute: 2.5 10*3/uL (ref 0.7–3.1)
Lymphs: 32 %
MCH: 30.3 pg (ref 26.6–33.0)
MCHC: 33.1 g/dL (ref 31.5–35.7)
MCV: 92 fL (ref 79–97)
Monocytes Absolute: 0.6 10*3/uL (ref 0.1–0.9)
Monocytes: 8 %
Neutrophils Absolute: 4.5 10*3/uL (ref 1.4–7.0)
Neutrophils: 57 %
Platelets: 212 10*3/uL (ref 150–450)
RBC: 5.51 x10E6/uL (ref 4.14–5.80)
RDW: 13.1 % (ref 11.6–15.4)
WBC: 7.9 10*3/uL (ref 3.4–10.8)

## 2022-10-14 LAB — LIPID PANEL W/O CHOL/HDL RATIO
Cholesterol, Total: 131 mg/dL (ref 100–199)
HDL: 46 mg/dL (ref >39)
LDL Chol Calc (NIH): 68 mg/dL (ref 0–99)
Triglycerides: 86 mg/dL (ref 0–149)
VLDL Cholesterol Cal: 17 mg/dL (ref 5–40)

## 2022-10-14 LAB — HEMOGLOBIN A1C
Est. average glucose Bld gHb Est-mCnc: 123 mg/dL
Hgb A1c MFr Bld: 5.9 % — ABNORMAL HIGH (ref 4.8–5.6)

## 2022-10-14 LAB — PSA: Prostate Specific Ag, Serum: 3.7 ng/mL (ref 0.0–4.0)

## 2022-10-14 NOTE — Progress Notes (Signed)
Contacted via MyChart   Good afternoon Thomas Miller, your labs have returned: - Kidney function, creatinine and eGFR, remains normal, as is liver function, AST and ALT.  - CBC shows no anemia or infection. - Cholesterol levels look fantastic, continue medication as ordered. - PSA is in normal range for your age. - A1c remains in prediabetic range at 5.9% -- continue heavy focus on diet and regular activity.  Any questions? Keep being amazing!!  Thank you for allowing me to participate in your care.  I appreciate you. Kindest regards, Saidah Kempton

## 2022-10-29 DIAGNOSIS — Z1211 Encounter for screening for malignant neoplasm of colon: Secondary | ICD-10-CM | POA: Diagnosis not present

## 2022-11-03 LAB — COLOGUARD: COLOGUARD: NEGATIVE

## 2022-11-03 NOTE — Progress Notes (Signed)
Contacted via [place] is negative!!  Woohoo.  Repeat in 3 years!!

## 2022-11-25 ENCOUNTER — Ambulatory Visit: Payer: Medicare Other | Admitting: Emergency Medicine

## 2022-11-25 ENCOUNTER — Other Ambulatory Visit: Payer: Self-pay | Admitting: Nurse Practitioner

## 2022-11-25 VITALS — BP 128/72 | HR 55 | Ht 69.0 in | Wt 215.6 lb

## 2022-11-25 DIAGNOSIS — Z0000 Encounter for general adult medical examination without abnormal findings: Secondary | ICD-10-CM

## 2022-11-25 NOTE — Progress Notes (Signed)
Subjective:   Thomas Miller is a 72 y.o. male who presents for Medicare Annual/Subsequent preventive examination.  Visit Complete: In person  Patient Medicare AWV questionnaire was completed by the patient on 11/24/22; I have confirmed that all information answered by patient is correct and no changes since this date.  Cardiac Risk Factors include: advanced age (>65men, >69 women);male gender;hypertension;dyslipidemia;obesity (BMI >30kg/m2);Other (see comment), Risk factor comments: CAD     Objective:    Today's Vitals   11/24/22 1803 11/25/22 1534  BP:  128/72  Pulse:  (!) 55  SpO2:  95%  Weight:  215 lb 9.6 oz (97.8 kg)  Height:  5\' 9"  (1.753 m)  PainSc: 2     Body mass index is 31.84 kg/m.     11/25/2022    3:49 PM 11/26/2020    2:38 PM 11/05/2020    8:19 AM 11/04/2019    8:18 AM 08/28/2019    8:28 AM 11/01/2018    8:15 AM 01/02/2018   11:17 AM  Advanced Directives  Does Patient Have a Medical Advance Directive? Yes Yes Yes Yes No Yes No  Type of Estate agent of [place];Living will  Healthcare Power of [place];Living will Healthcare Power of [place];Living will  Living will;Healthcare Power of Attorney   Does patient want to make changes to medical advance directive? No - Patient declined No - Patient declined       Copy of Healthcare Power of Attorney in Chart? No - copy requested  No - copy requested No - copy requested  No - copy requested   Would patient like information on creating a medical advance directive?       No - Patient declined    Current Medications (verified) Outpatient Encounter Medications as of 11/25/2022  Medication Sig   allopurinol (ZYLOPRIM) 300 MG tablet Take 1 tablet (300 mg total) by mouth daily.   Hoppel Cider Vinegar 600 MG CAPS Take 600 mg by mouth daily.   aspirin EC 81 MG tablet Take 2 tablets (162 mg total) by mouth daily.   atorvastatin (LIPITOR) 80 MG tablet Take 1 tablet (80 mg total) by mouth daily at 6 PM.    esomeprazole (NEXIUM) 40 MG capsule Take 1 capsule (40 mg total) by mouth daily before breakfast.   gabapentin (NEURONTIN) 100 MG capsule Take 1 capsule (100 mg total) by mouth at bedtime.   Liniments (DEEP BLUE RELIEF) GEL Apply 1 application topically as needed for pain (joints). In joints   losartan (COZAAR) 25 MG tablet Take 1 tablet (25 mg total) by mouth daily.   metoprolol tartrate (LOPRESSOR) 25 MG tablet Take 1 tablet (25 mg total) by mouth 2 (two) times daily.   nitroGLYCERIN (NITROSTAT) 0.4 MG SL tablet Place 1 tablet (0.4 mg total) under the tongue every 5 (five) minutes as needed for chest pain.   VITAMIN D, CHOLECALCIFEROL, PO Take 2,000 mg by mouth daily.   No facility-administered encounter medications on file as of 11/25/2022.    Allergies (verified) Doxycycline, Ace inhibitors, Codeine, and Naprosyn [naproxen]   History: Past Medical History:  Diagnosis Date   Advanced care planning/counseling discussion 11/27/2016   Allergy 1986   Aortic atherosclerosis (HCC) 03/24/2020   Noted on CT 03/01/19   Arthritis 1992   Body mass index (BMI) 30.0-30.9, adult 12/11/2020   BPH (benign prostatic hyperplasia) 10/25/2014   CAD (coronary artery disease) 08/07/2017   Cataract 2019   Chronic low back pain without sciatica 11/21/2020   Elevated  hemoglobin A1c measurement 04/06/2022   Essential (primary) hypertension 10/25/2014   GERD without esophagitis 10/25/2014   Gout    Hearing loss    History of kidney stones    History of stroke 10/05/2019   Hyperlipemia 10/25/2014   Insomnia 05/19/2016   Kidney stones    Myocardial infarction (HCC) 2019   PAF (paroxysmal atrial fibrillation) (HCC) 08/07/2017   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   S/P epidural steroid injection 05/2018   Stroke (HCC) 2021   Past Surgical History:  Procedure Laterality Date   BACK SURGERY     CHOLECYSTECTOMY  1996   CORONARY ARTERY BYPASS GRAFT N/A 07/20/2017   Procedure: CORONARY ARTERY BYPASS GRAFTING  (CABG) times four using left internal mammary artery to LAD and endoscopically harvested right saphenous vein graft to distal circumflex, intermediate, and PD;  Surgeon: [name], MD;  Location: [facility];  Service: Open Heart Surgery;  Laterality: N/A;   EPIDURAL BLOCK INJECTION  05/2018   L3injections    EYE SURGERY Bilateral 2013   FOOT SURGERY Bilateral    ingrown toenails   GALLBLADDER SURGERY     LEFT HEART CATH AND CORONARY ANGIOGRAPHY N/A 07/17/2017   Procedure: LEFT HEART CATH AND CORONARY ANGIOGRAPHY;  Surgeon: Yvonne Kendall, MD;  Location: [facility] INVASIVE CV LAB;  Service: Cardiovascular;  Laterality: N/A;   SHOULDER SURGERY Right    SPINE SURGERY     Family History  Problem Relation Age of Onset   Diabetes Mother    Lung cancer Father    Cancer Father    Stroke Paternal Grandfather    Cancer Brother    Cancer Sister    Social History   Socioeconomic History   Marital status: Widowed    Spouse name: Not on file   Number of children: 2   Years of education: Not on file   Highest education level: Associate degree: occupational, Scientist, product/process development, or vocational program  Occupational History   Occupation: retired  Tobacco Use   Smoking status: Never   Smokeless tobacco: Never  Vaping Use   Vaping status: Never Used  Substance and Sexual Activity   Alcohol use: Not Currently    Comment: Occasional glass of wine less than monthly   Drug use: No   Sexual activity: Yes  Other Topics Concern   Not on file  Social History Narrative   Volunteers and golfs    Social Determinants of Health   Financial Resource Strain: Low Risk  (11/24/2022)   Overall Financial Resource Strain (CARDIA)    Difficulty of Paying Living Expenses: Not hard at all  Food Insecurity: No Food Insecurity (11/24/2022)   Hunger Vital Sign    Worried About Running Out of Food in the Last Year: Never true    Ran Out of Food in the Last Year: Never true  Transportation Needs: No Transportation Needs  (11/24/2022)   PRAPARE - Administrator, Civil Service (Medical): No    Lack of Transportation (Non-Medical): No  Physical Activity: Sufficiently Active (11/25/2022)   Exercise Vital Sign    Days of Exercise per Week: 7 days    Minutes of Exercise per Session: 40 min  Stress: No Stress Concern Present (11/24/2022)   [employer] of Occupational Health - Occupational Stress Questionnaire    Feeling of Stress : Not at all  Social Connections: Moderately Integrated (11/24/2022)   Social Connection and Isolation Panel [NHANES]    Frequency of Communication with Friends and Family: More than three  times a week    Frequency of Social Gatherings with Friends and Family: More than three times a week    Attends Religious Services: More than 4 times per year    Active Member of Clubs or Organizations: Yes    Attends Banker Meetings: More than 4 times per year    Marital Status: Widowed    Tobacco Counseling Counseling given: Not Answered   Clinical Intake:  Pre-visit preparation completed: Yes  Pain : 0-10 Pain Score: 2  Pain Type: Chronic pain Pain Location: Hip Pain Orientation: Right Pain Descriptors / Indicators: Aching     BMI - recorded: 31.84 Nutritional Status: BMI > 30  Obese Nutritional Risks: None Diabetes: No  How often do you need to have someone help you when you read instructions, pamphlets, or other written materials from your doctor or pharmacy?: 1 - Never  Interpreter Needed?: No  Information entered by :: Tora Kindred, CMA   Activities of Daily Living    11/24/2022    6:03 PM 10/13/2022    8:55 AM  In your present state of health, do you have any difficulty performing the following activities:  Hearing? 0 0  Vision? 0 0  Difficulty concentrating or making decisions? 0 0  Walking or climbing stairs? 0 0  Dressing or bathing? 0 0  Doing errands, shopping? 0 0  Preparing Food and eating ? N   Using the Toilet? N   In  the past six months, have you accidently leaked urine? N   Do you have problems with loss of bowel control? N   Managing your Medications? N   Managing your Finances? N   Housekeeping or managing your Housekeeping? N     Patient Care Team: Marjie Skiff, NP as PCP - General (Nurse Practitioner) Arman Bogus, MD as Consulting Physician (Neurosurgery) Galen Manila, MD as Referring Physician (Ophthalmology)  Indicate any recent Medical Services you may have received from other than [facility] providers in the past year (date may be approximate).     Assessment:   This is a routine wellness examination for [place].  Hearing/Vision screen Hearing Screening - Comments:: Some difficulty hearing in crowded rooms Vision Screening - Comments:: Gets eye exam   Goals Addressed               This Visit's Progress     Patient Stated (pt-stated)        Play 2 times per week      Depression Screen    11/25/2022    3:47 PM 10/13/2022    8:42 AM 04/09/2022    8:20 AM 11/06/2021    8:39 AM 04/08/2021    9:27 AM 01/07/2021    9:45 AM 11/21/2020    2:07 PM  PHQ 2/9 Scores  PHQ - 2 Score 0 0 0 0 0 0 0  PHQ- 9 Score 0 0 0 2 0 3 1    Fall Risk    11/24/2022    6:03 PM 10/13/2022    8:42 AM 04/09/2022    8:18 AM 11/06/2021    8:35 AM 04/08/2021    9:27 AM  Fall Risk   Falls in the past year? 0 0 0 0 0  Number falls in past yr: 0 0 0 0 0  Injury with Fall? 0 0 0 0 0  Risk for fall due to : No Fall Risks No Fall Risks No Fall Risks  No Fall Risks  Follow up Falls  prevention discussed Falls evaluation completed Falls evaluation completed Falls evaluation completed;Education provided;Falls prevention discussed Falls evaluation completed    MEDICARE RISK AT HOME: Medicare Risk at Home Any stairs in or around the home?: Yes If so, are there any without handrails?: No Home free of loose throw rugs in walkways, pet beds, electrical cords, etc?: Yes Adequate lighting in your home  to reduce risk of falls?: Yes Life alert?: No Use of a cane, walker or w/c?: No Grab bars in the bathroom?: Yes Shower chair or bench in shower?: Yes Elevated toilet seat or a handicapped toilet?: Yes  TIMED UP AND GO:  Was the test performed?  Yes  Length of time to ambulate 10 feet: 8 sec Gait steady and fast without use of assistive device    Cognitive Function:        11/25/2022    3:51 PM 11/06/2021    8:36 AM 11/05/2020    8:21 AM 11/04/2019    8:20 AM 11/01/2018    8:17 AM  6CIT Screen  What Year? 0 points 0 points 0 points 0 points 0 points  What month? 0 points 0 points 0 points 0 points 0 points  What time? 0 points 0 points 0 points 0 points 0 points  Count back from 20 0 points 0 points 0 points 0 points 0 points  Months in reverse 0 points 0 points 0 points 0 points 0 points  Repeat phrase 0 points 0 points 2 points 0 points 0 points  Total Score 0 points 0 points 2 points 0 points 0 points    Immunizations Immunization History  Administered Date(s) Administered   Moderna Sars-Covid-2 Vaccination 09/07/2019, 10/07/2019, 09/11/2020   Pneumococcal Conjugate-13 11/19/2015   Td 11/10/2007   Tdap 10/09/2021   Zoster, Live 02/17/2009    TDAP status: Up to date  Flu Vaccine status: Declined, Education has been provided regarding the importance of this vaccine but patient still declined. Advised may receive this vaccine at local pharmacy or Health Dept. Aware to provide a copy of the vaccination record if obtained from local pharmacy or Health Dept. Verbalized acceptance and understanding.  Pneumococcal vaccine status: Declined,  Education has been provided regarding the importance of this vaccine but patient still declined. Advised may receive this vaccine at local pharmacy or Health Dept. Aware to provide a copy of the vaccination record if obtained from local pharmacy or Health Dept. Verbalized acceptance and understanding.   Covid-19 vaccine status: Declined,  Education has been provided regarding the importance of this vaccine but patient still declined. Advised may receive this vaccine at local pharmacy or Health Dept.or vaccine clinic. Aware to provide a copy of the vaccination record if obtained from local pharmacy or Health Dept. Verbalized acceptance and understanding.  Qualifies for Shingles Vaccine? Yes   Zostavax completed Yes   Shingrix Completed?: No.    Education has been provided regarding the importance of this vaccine. Patient has been advised to call insurance company to determine out of pocket expense if they have not yet received this vaccine. Advised may also receive vaccine at local pharmacy or Health Dept. Verbalized acceptance and understanding.  Screening Tests Health Maintenance  Topic Date Due   COVID-19 Vaccine (4 - 2023-24 season) 09/28/2022   Zoster Vaccines- Shingrix (1 of 2) 01/12/2023 (Originally [birth year]/1999)   INFLUENZA VACCINE  04/27/2023 (Originally 08/28/2022)   Medicare Annual Wellness (AWV)  11/25/2023   Fecal DNA (Cologuard)  10/28/2025   DTaP/Tdap/Td (3 - Td or Tdap)  10/10/2031   Hepatitis C Screening  Completed   HPV VACCINES  Aged Out   Pneumonia Vaccine 19+ Years old  Discontinued    Health Maintenance  Health Maintenance Due  Topic Date Due   COVID-19 Vaccine (4 - 2023-24 season) 09/28/2022    Colorectal cancer screening: Type of screening: Cologuard. Completed 10/29/22. Repeat every 3 years  Lung Cancer Screening: (Low Dose CT Chest recommended if Age 105-80 years, 20 pack-year currently smoking OR have quit w/in 15years.) does not qualify.   Lung Cancer Screening Referral: n/a  Additional Screening:  Hepatitis C Screening: does not qualify; Completed 03/09/21  Vision Screening: Recommended annual ophthalmology exams for early detection of glaucoma and other disorders of the eye.    [place] Screening: Recommended annual dental exams for proper oral hygiene  Community Resource Referral /  Chronic Care Management: CRR required this visit?  No   CCM required this visit?  No     Plan:     I have personally reviewed and noted the following in the patient's chart:   Medical and social history Use of alcohol, tobacco or illicit drugs  Current medications and supplements including opioid prescriptions. Patient is not currently taking opioid prescriptions. Functional ability and status Nutritional status Physical activity Advanced directives List of other physicians Hospitalizations, surgeries, and ER visits in previous 12 months Vitals Screenings to include cognitive, depression, and falls Referrals and appointments  In addition, I have reviewed and discussed with patient certain preventive protocols, quality metrics, and best practice recommendations. A written personalized care plan for preventive services as well as general preventive health recommendations were provided to patient.     Tora Kindred, CMA   11/25/2022   After Visit Summary: (In Person-Printed) AVS printed and given to the patient  Nurse Notes:  Declined flu, pneumonia and covid vaccines

## 2022-11-25 NOTE — Patient Instructions (Addendum)
Thomas Miller , Thank you for taking time to come for your Medicare Wellness Visit. I appreciate your ongoing commitment to your health goals. Please review the following plan we discussed and let me know if I can assist you in the future.   Referrals/Orders/Follow-Ups/Clinician Recommendations: Keep up the good work!  This is a list of the screening recommended for you and due dates:  Health Maintenance  Topic Date Due   COVID-19 Vaccine (4 - 2023-24 season) 09/28/2022   Zoster (Shingles) Vaccine (1 of 2) 01/12/2023*   Flu Shot  04/27/2023*   Medicare Annual Wellness Visit  11/25/2023   Cologuard (Stool DNA test)  10/28/2025   DTaP/Tdap/Td vaccine (3 - Td or Tdap) 10/10/2031   Hepatitis C Screening  Completed   HPV Vaccine  Aged Out   Pneumonia Vaccine  Discontinued  *Topic was postponed. The date shown is not the original due date.    Advanced directives: (Copy Requested) Please bring a copy of your health care power of attorney and living will to the office to be added to your chart at your convenience.  Next Medicare Annual Wellness Visit scheduled for next year: Yes, 12/01/23 @ 3:50pm

## 2022-11-26 NOTE — Telephone Encounter (Signed)
Requested medication (s) are due for refill today: yes  Requested medication (s) are on the active medication list: yes  Last refill:  11/29/21  Future visit scheduled: no  Notes to clinic:  Unable to refill per protocol, last refill by another provider.      Requested Prescriptions  Pending Prescriptions Disp Refills   metoprolol tartrate (LOPRESSOR) 25 MG tablet [Pharmacy Med Name: METOPROLOL TARTRATE 25 MG TAB] 180 tablet 0    Sig: Take 1 tablet (25 mg total) by mouth 2 (two) times daily.     There is no refill protocol information for this order

## 2022-12-18 DIAGNOSIS — M5416 Radiculopathy, lumbar region: Secondary | ICD-10-CM | POA: Diagnosis not present

## 2022-12-22 DIAGNOSIS — M47816 Spondylosis without myelopathy or radiculopathy, lumbar region: Secondary | ICD-10-CM | POA: Diagnosis not present

## 2023-04-04 NOTE — Patient Instructions (Signed)

## 2023-04-06 ENCOUNTER — Ambulatory Visit (INDEPENDENT_AMBULATORY_CARE_PROVIDER_SITE_OTHER): Payer: Medicare Other | Admitting: Nurse Practitioner

## 2023-04-06 ENCOUNTER — Encounter: Payer: Self-pay | Admitting: Nurse Practitioner

## 2023-04-06 VITALS — BP 125/65 | HR 51 | Temp 97.6°F | Ht 69.0 in | Wt 213.2 lb

## 2023-04-06 DIAGNOSIS — I25118 Atherosclerotic heart disease of native coronary artery with other forms of angina pectoris: Secondary | ICD-10-CM

## 2023-04-06 DIAGNOSIS — E782 Mixed hyperlipidemia: Secondary | ICD-10-CM

## 2023-04-06 DIAGNOSIS — I700 Atherosclerosis of aorta: Secondary | ICD-10-CM

## 2023-04-06 DIAGNOSIS — R7309 Other abnormal glucose: Secondary | ICD-10-CM

## 2023-04-06 DIAGNOSIS — I480 Paroxysmal atrial fibrillation: Secondary | ICD-10-CM

## 2023-04-06 DIAGNOSIS — Z6830 Body mass index (BMI) 30.0-30.9, adult: Secondary | ICD-10-CM

## 2023-04-06 DIAGNOSIS — M79671 Pain in right foot: Secondary | ICD-10-CM | POA: Insufficient documentation

## 2023-04-06 DIAGNOSIS — I10 Essential (primary) hypertension: Secondary | ICD-10-CM | POA: Diagnosis not present

## 2023-04-06 HISTORY — DX: Pain in right foot: M79.671

## 2023-04-06 NOTE — Assessment & Plan Note (Signed)
 Acute for 2 months, but feels it is improving.  Discomfort along medial aspect of ankle.  Recommend he apply ice to area after golfing or on feet for long periods.  May use Voltaren gel as needed for pain.   Continue Tylenol and Epsom Salts at home.  Discussed stretches he can perform at home to help improve discomfort.  If ongoing in 4 weeks return to office and we may need to send to podiatry.

## 2023-04-06 NOTE — Progress Notes (Signed)
 BP 125/65   Pulse (!) 51   Temp 97.6 F (36.4 C) (Oral)   Ht 5\' 9"  (1.753 m)   Wt 213 lb 3.2 oz (96.7 kg)   SpO2 98%   BMI 31.48 kg/m    Subjective:    Patient ID: Thomas Miller, male    DOB: 1950/12/17, 73 y.o.   MRN: [id number]  HPI: Thomas Miller is a 73 y.o. male  Chief Complaint  Patient presents with   Atrial Fibrillation   Hyperlipidemia   Hypertension   HYPERTENSION / HYPERLIPIDEMIA + CAD Continues on Losartan, Metoprolol, Lipitor.  History of several mini strokes per patient. Saw cardiology on 09/10/22, no changes.  Has not used NTG in the past month. Satisfied with current treatment? yes Duration of hypertension: chronic BP monitoring frequency: daily BP range: 120/70 range, occasionally a little higher BP medication side effects: no Duration of hyperlipidemia: chronic Cholesterol medication side effects: no Cholesterol supplements: none Medication compliance: good compliance Aspirin: yes Recent stressors: no Recurrent headaches: no Visual changes: no Palpitations: occasional Dyspnea: no  Chest pain: yes, not in one month Lower extremity edema: no Dizzy/lightheaded: no   ATRIAL FIBRILLATION Currently not on anticoagulant. Atrial fibrillation status: stable Satisfied with current treatment: yes  Medication side effects:  no Medication compliance: good compliance Etiology of atrial fibrillation: unknown Palpitations:  occasional Chest pain: as above Dyspnea on exertion:  no Orthopnea:  no Syncope:  no Edema:  no Ventricular rate control: B-blocker Anti-coagulation:  not currently    Impaired Fasting Glucose HbA1C:  Lab Results  Component Value Date   HGBA1C 5.9 (H) 10/13/2022  Duration of elevated blood sugar:  Polydipsia: no Polyuria: no Weight change: no Visual disturbance: no Glucose Monitoring: no    Accucheck frequency: Not Checking    Fasting glucose:     Post prandial:  Diabetic Education: Not Completed Family history of  diabetes: yes  HEEL PAIN (RIGHT) Has been present about 2 months, had similar about 50 years ago. Duration: months Involved foot: right Mechanism of injury: unknown Location: heel Onset: sudden  Severity: 5/10 at worst, 1/10 present Quality:  dull, aching, and throbbing Frequency: constant Radiation: no Aggravating factors: weight bearing, walking, and movement -- notices it a lot while out on golf course Alleviating factors: soaking in Epsom Salt  Status: fluctuating Treatments attempted: Epsom Salt, Tylenol  Relief with NSAIDs?:  No NSAIDs Taken Weakness with weight bearing or walking: yes Morning stiffness: yes Swelling: no Redness: no Bruising: no Paresthesias / decreased sensation: no  Fevers:no   Relevant past medical, surgical, family and social history reviewed and updated as indicated. Interim medical history since our last visit reviewed. Allergies and medications reviewed and updated.  Review of Systems  Constitutional:  Negative for activity change, diaphoresis, fatigue and fever.  Respiratory:  Negative for cough, chest tightness, shortness of breath and wheezing.   Cardiovascular:  Positive for chest pain (none in past month). Negative for palpitations and leg swelling.  Gastrointestinal: Negative.   Endocrine: Negative for polydipsia, polyphagia and polyuria.  Neurological: Negative.   Psychiatric/Behavioral: Negative.     Per HPI unless specifically indicated above     Objective:    BP 125/65   Pulse (!) 51   Temp 97.6 F (36.4 C) (Oral)   Ht 5\' 9"  (1.753 m)   Wt 213 lb 3.2 oz (96.7 kg)   SpO2 98%   BMI 31.48 kg/m   Wt Readings from Last 3 Encounters:  04/06/23 213 lb  3.2 oz (96.7 kg)  11/25/22 215 lb 9.6 oz (97.8 kg)  10/13/22 210 lb 12.8 oz (95.6 kg)    Physical Exam Vitals and nursing note reviewed.  Constitutional:      General: He is awake. He is not in acute distress.    Appearance: He is well-developed and well-groomed. He is obese.  He is not ill-appearing or toxic-appearing.  HENT:     Head: Normocephalic.     Right Ear: Hearing and external ear normal.     Left Ear: Hearing and external ear normal.  Eyes:     General: Lids are normal.     Extraocular Movements: Extraocular movements intact.     Conjunctiva/sclera: Conjunctivae normal.  Neck:     Thyroid: No thyromegaly.     Vascular: No carotid bruit.  Cardiovascular:     Rate and Rhythm: Regular rhythm. Bradycardia present.     Heart sounds: Murmur heard.     Systolic murmur is present.  Pulmonary:     Effort: No accessory muscle usage or respiratory distress.     Breath sounds: Normal breath sounds.  Abdominal:     General: Bowel sounds are normal. There is no distension.     Palpations: Abdomen is soft.     Tenderness: There is no abdominal tenderness.  Musculoskeletal:     Cervical back: Full passive range of motion without pain.     Right lower leg: No edema.     Left lower leg: No edema.     Right ankle: No swelling. Tenderness present over the medial malleolus. Normal range of motion.     Right Achilles Tendon: No tenderness. Thompson's test negative.     Left ankle: No swelling. No tenderness. Normal range of motion.     Left Achilles Tendon: No tenderness. Thompson's test negative.  Lymphadenopathy:     Cervical: No cervical adenopathy.  Skin:    General: Skin is warm.     Capillary Refill: Capillary refill takes less than 2 seconds.  Neurological:     Mental Status: He is alert and oriented to person, place, and time.     Deep Tendon Reflexes: Reflexes are normal and symmetric.     Reflex Scores:      Brachioradialis reflexes are 2+ on the right side and 2+ on the left side.      Patellar reflexes are 2+ on the right side and 2+ on the left side. Psychiatric:        Attention and Perception: Attention normal.        Mood and Affect: Mood normal.        Speech: Speech normal.        Behavior: Behavior normal. Behavior is cooperative.         Thought Content: Thought content normal.     Results for orders placed or performed in visit on 10/13/22  Comprehensive metabolic panel   Collection Time: 10/13/22  9:13 AM  Result Value Ref Range   Glucose 91 70 - 99 mg/dL   BUN 13 8 - 27 mg/dL   Creatinine, Ser 1.61 0.76 - 1.27 mg/dL   eGFR 70 >09 UE/AVW/0.98   BUN/Creatinine Ratio 12 10 - 24   Sodium 141 134 - 144 mmol/L   Potassium 4.8 3.5 - 5.2 mmol/L   Chloride 103 96 - 106 mmol/L   CO2 22 20 - 29 mmol/L   Calcium 9.8 8.6 - 10.2 mg/dL   Total Protein 7.2 6.0 - 8.5 g/dL  Albumin 4.8 3.8 - 4.8 g/dL   Globulin, Total 2.4 1.5 - 4.5 g/dL   Bilirubin Total 0.7 0.0 - 1.2 mg/dL   Alkaline Phosphatase 100 44 - 121 IU/L   AST 21 0 - 40 IU/L   ALT 22 0 - 44 IU/L  CBC with Differential/Platelet   Collection Time: 10/13/22  9:13 AM  Result Value Ref Range   WBC 7.9 3.4 - 10.8 x10E3/uL   RBC 5.51 4.14 - 5.80 x10E6/uL   Hemoglobin 16.7 13.0 - 17.7 g/dL   Hematocrit 52.8 41.3 - 51.0 %   MCV 92 79 - 97 fL   MCH 30.3 26.6 - 33.0 pg   MCHC 33.1 31.5 - 35.7 g/dL   RDW 24.4 01.0 - 27.2 %   Platelets 212 150 - 450 x10E3/uL   Neutrophils 57 Not Estab. %   Lymphs 32 Not Estab. %   Monocytes 8 Not Estab. %   Eos 2 Not Estab. %   Basos 1 Not Estab. %   Neutrophils Absolute 4.5 1.4 - 7.0 x10E3/uL   Lymphocytes Absolute 2.5 0.7 - 3.1 x10E3/uL   Monocytes Absolute 0.6 0.1 - 0.9 x10E3/uL   EOS (ABSOLUTE) 0.2 0.0 - 0.4 x10E3/uL   Basophils Absolute 0.1 0.0 - 0.2 x10E3/uL   Immature Granulocytes 0 Not Estab. %   Immature Grans (Abs) 0.0 0.0 - 0.1 x10E3/uL  Lipid Panel w/o Chol/HDL Ratio   Collection Time: 10/13/22  9:13 AM  Result Value Ref Range   Cholesterol, Total 131 100 - 199 mg/dL   Triglycerides 86 0 - 149 mg/dL   HDL 46 >53 mg/dL   VLDL Cholesterol Cal 17 5 - 40 mg/dL   LDL Chol Calc (NIH) 68 0 - 99 mg/dL  HgB G6Y   Collection Time: 10/13/22  9:13 AM  Result Value Ref Range   Hgb A1c MFr Bld 5.9 (H) 4.8 - 5.6 %   Est.  average glucose Bld gHb Est-mCnc 123 mg/dL  PSA   Collection Time: 10/13/22  9:13 AM  Result Value Ref Range   Prostate Specific Ag, Serum 3.7 0.0 - 4.0 ng/mL  Cologuard   Collection Time: 10/29/22 10:00 AM  Result Value Ref Range   COLOGUARD Negative Negative      Assessment & Plan:   Problem List Items Addressed This Visit       Cardiovascular and Mediastinum   Aortic atherosclerosis (HCC)   Ongoing.  Noted on CT 03/01/2019 -- continue statin daily for prevention + ASA.      CAD (coronary artery disease)   Chronic, stable.  No recent NTG use, monitor closely and if increased use to alert PCP or cardiology.  Continue collaboration with cardiology, recent notes reviewed.      Essential (primary) hypertension   Chronic, stable.  BP at goal on exam and on home checks.  Recommend he continue to monitor BP at home daily + document and bring to provider visits.  Continue current medication regimen at this time and adjust as needed. LAB: CBC, CMP, urine ALB.  Focus on DASH diet at home.        Relevant Orders   Comprehensive metabolic panel   Urine Microalbumin w/creat. ratio   PAF (paroxysmal atrial fibrillation) (HCC) - Primary   Chronic, followed by cardiology.  On BB and Baby ASA.  Continue current medication regimen as recommended by them.          Other   Body mass index (BMI) 30.0-30.9, adult   BMI  31.48. Recommended eating smaller high protein, low fat meals more frequently and exercising 30 mins a day 5 times a week with a goal of 10-15lb weight loss in the next 3 months. Patient voiced their understanding and motivation to adhere to these recommendations.       Elevated hemoglobin A1c measurement   A1c 5.9% last check, monitor closely and continue diet/exercise focus at home.  Discussed at length with patient.  Recheck A1c today.      Relevant Orders   HgB A1c   Hyperlipemia   Chronic, ongoing.  Continue current medication regimen and adjust as needed.  Lipid panel  today.      Relevant Orders   Comprehensive metabolic panel   Lipid Panel w/o Chol/HDL Ratio   Pain of right heel   Acute for 2 months, but feels it is improving.  Discomfort along medial aspect of ankle.  Recommend he apply ice to area after golfing or on feet for long periods.  May use Voltaren gel as needed for pain.   Continue Tylenol and Epsom Salts at home.  Discussed stretches he can perform at home to help improve discomfort.  If ongoing in 4 weeks return to office and we may need to send to podiatry.        Follow up plan: Return in about 6 months (around 10/07/2023) for Annual Physical after 10/13/23.

## 2023-04-06 NOTE — Assessment & Plan Note (Signed)
 Chronic, stable.  No recent NTG use, monitor closely and if increased use to alert PCP or cardiology.  Continue collaboration with cardiology, recent notes reviewed.

## 2023-04-06 NOTE — Assessment & Plan Note (Signed)
 A1c 5.9% last check, monitor closely and continue diet/exercise focus at home.  Discussed at length with patient.  Recheck A1c today.

## 2023-04-06 NOTE — Assessment & Plan Note (Signed)
 Chronic, stable.  BP at goal on exam and on home checks.  Recommend he continue to monitor BP at home daily + document and bring to provider visits.  Continue current medication regimen at this time and adjust as needed. LAB: CBC, CMP, urine ALB.  Focus on DASH diet at home.

## 2023-04-06 NOTE — Assessment & Plan Note (Signed)
Ongoing.  Noted on CT 03/01/2019 -- continue statin daily for prevention + ASA.

## 2023-04-06 NOTE — Assessment & Plan Note (Signed)
BMI 31.48.  Recommended eating smaller high protein, low fat meals more frequently and exercising 30 mins a day 5 times a week with a goal of 10-15lb weight loss in the next 3 months. Patient voiced their understanding and motivation to adhere to these recommendations.  

## 2023-04-06 NOTE — Assessment & Plan Note (Signed)
Chronic, followed by cardiology.  On BB and Baby ASA.  Continue current medication regimen as recommended by them.

## 2023-04-06 NOTE — Assessment & Plan Note (Signed)
 Chronic, ongoing.  Continue current medication regimen and adjust as needed. Lipid panel today.

## 2023-04-07 ENCOUNTER — Encounter: Payer: Self-pay | Admitting: Nurse Practitioner

## 2023-04-07 LAB — COMPREHENSIVE METABOLIC PANEL
ALT: 30 IU/L (ref 0–44)
AST: 30 IU/L (ref 0–40)
Albumin: 4.5 g/dL (ref 3.8–4.8)
Alkaline Phosphatase: 100 IU/L (ref 44–121)
BUN/Creatinine Ratio: 16 (ref 10–24)
BUN: 17 mg/dL (ref 8–27)
Bilirubin Total: 0.6 mg/dL (ref 0.0–1.2)
CO2: 22 mmol/L (ref 20–29)
Calcium: 9.5 mg/dL (ref 8.6–10.2)
Chloride: 105 mmol/L (ref 96–106)
Creatinine, Ser: 1.08 mg/dL (ref 0.76–1.27)
Globulin, Total: 2 g/dL (ref 1.5–4.5)
Glucose: 80 mg/dL (ref 70–99)
Potassium: 4.7 mmol/L (ref 3.5–5.2)
Sodium: 142 mmol/L (ref 134–144)
Total Protein: 6.5 g/dL (ref 6.0–8.5)
eGFR: 72 mL/min/{1.73_m2} (ref >59)

## 2023-04-07 LAB — LIPID PANEL W/O CHOL/HDL RATIO
Cholesterol, Total: 124 mg/dL (ref 100–199)
HDL: 42 mg/dL (ref >39)
LDL Chol Calc (NIH): 55 mg/dL (ref 0–99)
Triglycerides: 157 mg/dL — ABNORMAL HIGH (ref 0–149)
VLDL Cholesterol Cal: 27 mg/dL (ref 5–40)

## 2023-04-07 LAB — HEMOGLOBIN A1C
Est. average glucose Bld gHb Est-mCnc: 126 mg/dL
Hgb A1c MFr Bld: 6 % — ABNORMAL HIGH (ref 4.8–5.6)

## 2023-04-07 LAB — MICROALBUMIN / CREATININE URINE RATIO
Creatinine, Urine: 145.9 mg/dL
Microalb/Creat Ratio: 18 mg/g{creat} (ref 0–29)
Microalbumin, Urine: 25.9 ug/mL

## 2023-04-07 NOTE — Progress Notes (Signed)
 Contacted via MyChart   Good morning Thomas Miller, your labs have returned and overall remain stable.  A1c remains in prediabetic range, continue focus on healthy diet and regular exercise.  Continue all current medications.  Any questions? Keep being stellar!!  Thank you for allowing me to participate in your care.  I appreciate you. Kindest regards, Gerik Coberly

## 2023-08-14 DIAGNOSIS — H2513 Age-related nuclear cataract, bilateral: Secondary | ICD-10-CM | POA: Diagnosis not present

## 2023-08-14 DIAGNOSIS — H02403 Unspecified ptosis of bilateral eyelids: Secondary | ICD-10-CM | POA: Diagnosis not present

## 2023-09-08 DIAGNOSIS — M1990 Unspecified osteoarthritis, unspecified site: Secondary | ICD-10-CM | POA: Insufficient documentation

## 2023-09-08 DIAGNOSIS — I639 Cerebral infarction, unspecified: Secondary | ICD-10-CM | POA: Insufficient documentation

## 2023-09-08 DIAGNOSIS — T7840XA Allergy, unspecified, initial encounter: Secondary | ICD-10-CM | POA: Insufficient documentation

## 2023-09-08 DIAGNOSIS — I219 Acute myocardial infarction, unspecified: Secondary | ICD-10-CM | POA: Insufficient documentation

## 2023-09-08 DIAGNOSIS — H269 Unspecified cataract: Secondary | ICD-10-CM | POA: Insufficient documentation

## 2023-09-09 ENCOUNTER — Telehealth (HOSPITAL_COMMUNITY): Payer: Self-pay | Admitting: *Deleted

## 2023-09-09 ENCOUNTER — Encounter (HOSPITAL_COMMUNITY): Payer: Self-pay | Admitting: *Deleted

## 2023-09-09 ENCOUNTER — Encounter: Payer: Self-pay | Admitting: Cardiology

## 2023-09-09 ENCOUNTER — Ambulatory Visit: Attending: Cardiology | Admitting: Cardiology

## 2023-09-09 VITALS — BP 132/82 | HR 51 | Ht 69.6 in | Wt 209.8 lb

## 2023-09-09 DIAGNOSIS — I2510 Atherosclerotic heart disease of native coronary artery without angina pectoris: Secondary | ICD-10-CM | POA: Diagnosis not present

## 2023-09-09 NOTE — Progress Notes (Signed)
 Cardiology Office Note:    Date:  09/09/2023   ID:  Thomas Miller, DOB 08-04-1950, MRN [id number]  PCP:  Valerio Melanie DASEN, NP  Cardiologist:  Jennifer JONELLE Crape, MD   Referring MD: Valerio Melanie DASEN, NP    ASSESSMENT:    1. Coronary artery disease involving native coronary artery of native heart without angina pectoris    PLAN:    In order of problems listed above:  Coronary artery disease: Secondary prevention stressed with the patient.  Importance of compliance with diet medication stressed and patient verbalized standing. Chest pain: Atypical in nature.  I reassured him about my findings but to allay his concerns I would like to do an exercise stress Cardiolite.  He is agreeable. Essential hypertension: Blood pressure is stable and diet was emphasized.  Lifestyle modification urged. Mixed dyslipidemia: On lipid-lowering medications followed by primary care.  Goal LDL less than 60. Patient will be seen in follow-up appointment in 6 months or earlier if the patient has any concerns.    Medication Adjustments/Labs and Tests Ordered: Current medicines are reviewed at length with the patient today.  Concerns regarding medicines are outlined above.  Orders Placed This Encounter  Procedures   EKG 12-Lead   No orders of the defined types were placed in this encounter.    No chief complaint on file.    History of Present Illness:    Thomas Miller is a 73 y.o. male.  Patient has a past medical history of coronary artery disease post CABG surgery, essential hypertension, mixed dyslipidemia.  He denies any problems at this time and takes care of activities of daily living.  No chest pain orthopnea or PND.  He walks almost 20 miles a week on the golf course.  Patient mentions to me that he will have chest discomfort occasionally.  It is not related to exertion.  He is concerned about it.  Nitroglycerin  has not helped him for the pain.  He wants to be evaluated.  At the time of my  evaluation, the patient is alert awake oriented and in no distress.  Past Medical History:  Diagnosis Date   Advanced care planning/counseling discussion 11/27/2016   Allergy 1986   Aortic atherosclerosis (HCC) 03/24/2020   Noted on CT 03/01/19   Arthritis 1992   Body mass index (BMI) 30.0-30.9, adult 12/11/2020   BPH (benign prostatic hyperplasia) 10/25/2014   CAD (coronary artery disease) 08/07/2017   Cataract 2019   Chronic low back pain without sciatica 11/21/2020   Elevated hemoglobin A1c measurement 04/06/2022   Essential (primary) hypertension 10/25/2014   GERD without esophagitis 10/25/2014   Gout    Hearing loss    History of kidney stones    History of stroke 10/05/2019   Hyperlipemia 10/25/2014   Insomnia 05/19/2016   Myocardial infarction (HCC) 2019   PAF (paroxysmal atrial fibrillation) (HCC) 08/07/2017   Pain of right heel 04/06/2023   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   S/P epidural steroid injection 05/2018   Stroke (HCC) 2021    Past Surgical History:  Procedure Laterality Date   BACK SURGERY     CHOLECYSTECTOMY  1996   CORONARY ARTERY BYPASS GRAFT N/A 07/20/2017   Procedure: CORONARY ARTERY BYPASS GRAFTING (CABG) times four using left internal mammary artery to LAD and endoscopically harvested right saphenous vein graft to distal circumflex, intermediate, and PD;  Surgeon: Army Dallas NOVAK, MD;  Location: [facility];  Service: Open Heart Surgery;  Laterality: N/A;   EPIDURAL  BLOCK INJECTION  05/2018   L3injections    EYE SURGERY Bilateral 2013   FOOT SURGERY Bilateral    ingrown toenails   GALLBLADDER SURGERY     LEFT HEART CATH AND CORONARY ANGIOGRAPHY N/A 07/17/2017   Procedure: LEFT HEART CATH AND CORONARY ANGIOGRAPHY;  Surgeon: Mady Bruckner, MD;  Location: [facility] INVASIVE [facility] LAB;  Service: Cardiovascular;  Laterality: N/A;   SHOULDER SURGERY Right    SPINE SURGERY      Current Medications: Current Meds  Medication Sig   allopurinol  (ZYLOPRIM ) 300 MG tablet  Take 1 tablet (300 mg total) by mouth daily.   Kleinfelter Cider Vinegar 600 MG CAPS Take 600 mg by mouth daily.   aspirin  EC 81 MG tablet Take 2 tablets (162 mg total) by mouth daily.   atorvastatin  (LIPITOR ) 80 MG tablet Take 1 tablet (80 mg total) by mouth daily at 6 PM.   esomeprazole  (NEXIUM ) 40 MG capsule Take 1 capsule (40 mg total) by mouth daily before breakfast.   gabapentin  (NEURONTIN ) 100 MG capsule Take 1 capsule (100 mg total) by mouth at bedtime.   Liniments (DEEP BLUE RELIEF) GEL Apply 1 application topically as needed for pain (joints). In joints   losartan  (COZAAR ) 25 MG tablet Take 1 tablet (25 mg total) by mouth daily.   metoprolol  tartrate (LOPRESSOR ) 25 MG tablet Take 1 tablet (25 mg total) by mouth 2 (two) times daily.   nitroGLYCERIN  (NITROSTAT ) 0.4 MG SL tablet Place 1 tablet (0.4 mg total) under the tongue every 5 (five) minutes as needed for chest pain.   VITAMIN D, CHOLECALCIFEROL, PO Take 2,000 mg by mouth daily.     Allergies:   Doxycycline, Ace inhibitors, Codeine, and Naprosyn [naproxen]   Social History   Socioeconomic History   Marital status: Widowed    Spouse name: Not on file   Number of children: 2   Years of education: Not on file   Highest education level: Associate degree: occupational, Scientist, product/process development, or vocational program  Occupational History   Occupation: retired  Tobacco Use   Smoking status: Never   Smokeless tobacco: Never  Vaping Use   Vaping status: Never Used  Substance and Sexual Activity   Alcohol use: Not Currently    Comment: Occasional glass of wine less than monthly   Drug use: No   Sexual activity: Yes  Other Topics Concern   Not on file  Social History Narrative   Volunteers and golfs    Social Drivers of Health   Financial Resource Strain: Low Risk  (04/02/2023)   Overall Financial Resource Strain (CARDIA)    Difficulty of Paying Living Expenses: Not hard at all  Food Insecurity: No Food Insecurity (04/02/2023)   Hunger Vital  Sign    Worried About Running Out of Food in the Last Year: Never true    Ran Out of Food in the Last Year: Never true  Transportation Needs: No Transportation Needs (04/02/2023)   PRAPARE - Administrator, Civil Service (Medical): No    Lack of Transportation (Non-Medical): No  Physical Activity: Sufficiently Active (04/02/2023)   Exercise Vital Sign    Days of Exercise per Week: 5 days    Minutes of Exercise per Session: 40 min  Stress: No Stress Concern Present (04/02/2023)   [employer] of Occupational Health - Occupational Stress Questionnaire    Feeling of Stress : Not at all  Social Connections: Moderately Integrated (04/02/2023)   Social Connection and Isolation Panel  Frequency of Communication with Friends and Family: More than three times a week    Frequency of Social Gatherings with Friends and Family: More than three times a week    Attends Religious Services: More than 4 times per year    Active Member of [employer] or Organizations: Yes    Attends Banker Meetings: More than 4 times per year    Marital Status: Widowed     Family History: The patient's family history includes Cancer in his brother, father, and sister; Diabetes in his mother; Lung cancer in his father; Stroke in his paternal grandfather.  ROS:   Please see the history of present illness.    All other systems reviewed and are negative.  EKGs/Labs/Other Studies Reviewed:    The following studies were reviewed today: .SABRAEKG Interpretation Date/Time:  Wednesday September 09 2023 08:42:32 EDT Ventricular Rate:  51 PR Interval:  188 QRS Duration:  82 QT Interval:  452 QTC Calculation: 416 R Axis:   -4  Text Interpretation: Sinus bradycardia Moderate voltage criteria for LVH, may be normal variant Inferior infarct (cited on or before 21-Jul-2017) Abnormal ECG When compared with ECG of 27-Jul-2017 12:55, Vent. rate has decreased BY  28 BPM Questionable change in initial forces of  Inferior leads QT has shortened Confirmed by Edwyna Backers [phone]) on 09/09/2023 9:06:40 AM     Recent Labs: 10/13/2022: Hemoglobin 16.7; Platelets 212 04/06/2023: ALT 30; BUN 17; Creatinine, Ser 1.08; Potassium 4.7; Sodium 142  Recent Lipid Panel    Component Value Date/Time   CHOL 124 04/06/2023 0936   CHOL 142 06/03/2017 0811   TRIG 157 (H) 04/06/2023 0936   TRIG 123 06/03/2017 0811   HDL 42 04/06/2023 0936   CHOLHDL 3.3 10/09/2021 0933   CHOLHDL 3.0 07/18/2017 0249   VLDL 10 07/18/2017 0249   VLDL 25 06/03/2017 0811   LDLCALC 55 04/06/2023 0936    Physical Exam:    VS:  BP 132/82   Pulse (!) 51   Ht 5' 9.6 (1.768 m)   Wt 209 lb 12.8 oz (95.2 kg)   SpO2 96%   BMI 30.45 kg/m     Wt Readings from Last 3 Encounters:  09/09/23 209 lb 12.8 oz (95.2 kg)  04/06/23 213 lb 3.2 oz (96.7 kg)  11/25/22 215 lb 9.6 oz (97.8 kg)     GEN: Patient is in no acute distress HEENT: Normal NECK: No JVD; No carotid bruits LYMPHATICS: No lymphadenopathy CARDIAC: Hear sounds regular, 2/6 systolic murmur at the apex. RESPIRATORY:  Clear to auscultation without rales, wheezing or rhonchi  ABDOMEN: Soft, non-tender, non-distended MUSCULOSKELETAL:  No edema; No deformity  SKIN: Warm and dry NEUROLOGIC:  Alert and oriented x 3 PSYCHIATRIC:  Normal affect   Signed, Backers JONELLE Edwyna, MD  09/09/2023 9:14 AM    [facility]

## 2023-09-09 NOTE — Patient Instructions (Addendum)
 Medication Instructions:  Your physician recommends that you continue on your current medications as directed. Please refer to the Current Medication list given to you today.  *If you need a refill on your cardiac medications before your next appointment, please call your pharmacy*   Lab Work: None ordered If you have labs (blood work) drawn today and your tests are completely normal, you will receive your results only by: MyChart Message (if you have MyChart) OR A paper copy in the mail If you have any lab test that is abnormal or we need to change your treatment, we will call you to review the results.   Testing/Procedures:   [facility] Cardiovascular Imaging at [facility] [address] [place], [place] [address] Phone: [phone]  September 09, 2023    Thomas Miller DOB: 24-Nov-1950 MRN: [id number] [address] [place] [address]   Dear Mr. Thomas Miller,  Please arrive 15 minutes prior to your appointment time for registration and insurance purposes.  The test will take approximately 3 to 4 hours to complete; you may bring reading material.  If someone comes with you to your appointment, they will need to remain in the main lobby due to limited space in the testing area. **If you are pregnant or breastfeeding, please notify the nuclear lab prior to your appointment**  How to prepare for your Myocardial Perfusion Test: Do not eat or drink 3 hours prior to your test, except you may have water. Do not consume products containing caffeine (regular or decaffeinated) 12 hours prior to your test. (ex: coffee, chocolate, sodas, tea). Do bring a list of your current medications with you.  If not listed below, you may take your medications as normal. Do not take metoprolol  (Lopressor , Toprol ) for 24 hours prior to the test.  Bring the medication to your appointment as you may be required to take it once the test is complete. Do wear comfortable clothes (no dresses  or overalls) and walking shoes, tennis shoes preferred (No heels or open toe shoes are allowed). Do NOT wear cologne, perfume, aftershave, or lotions (deodorant is allowed). If these instructions are not followed, your test will have to be rescheduled.  Please report to [address] ([facility]), 2nd Floor, for your test.  If you have questions or concerns about your appointment, you can call the Nuclear Lab at [phone].  If you cannot keep your appointment, please provide 24 hours notification to the Nuclear Lab, to avoid a possible $50 charge to your account.  Follow-Up: At [facility], you and your health needs are our priority.  As part of our continuing mission to provide you with exceptional heart care, we have created designated Provider Care Teams.  These Care Teams include your primary Cardiologist (physician) and Advanced Practice Providers (APPs -  Physician Assistants and Nurse Practitioners) who all work together to provide you with the care you need, when you need it.  We recommend signing up for the patient portal called MyChart.  Sign up information is provided on this After Visit Summary.  MyChart is used to connect with patients for Virtual Visits (Telemedicine).  Patients are able to view lab/test results, encounter notes, upcoming appointments, etc.  Non-urgent messages can be sent to your provider as well.   To learn more about what you can do with MyChart, go to [email or web address].    Your next appointment:   9 month(s)  Provider:  Jennifer Crape, MD   Other Instructions  Cardiac Nuclear Scan A cardiac nuclear scan is a test that is done to check the flow of blood to your heart. It is done when you are resting and when you are exercising. The test looks for problems such as: Not enough blood reaching a portion of the heart. The heart muscle not working as it should. You may need this test if you  have: Heart disease. Lab results that are not normal. Had heart surgery or a balloon procedure to open up blocked arteries (angioplasty) or a small mesh tube (stent). Chest pain. Shortness of breath. Had a heart attack. In this test, a special dye (tracer) is put into your bloodstream. The tracer will travel to your heart. A camera will then take pictures of your heart to see how the tracer moves through your heart. This test is usually done at a hospital and takes 2-4 hours. Tell a doctor about: Any allergies you have. All medicines you are taking, including vitamins, herbs, eye drops, creams, and over-the-counter medicines. Any bleeding problems you have. Any surgeries you have had. Any medical conditions you have. Whether you are pregnant or may be pregnant. Any history of asthma or long-term (chronic) lung disease. Any history of heart rhythm disorders or heart valve conditions. What are the risks? Your doctor will talk with you about risks. These may include: Serious chest pain and heart attack. This is only a risk if the stress portion of the test is done. Fast or uneven heartbeats (palpitations). A feeling of warmth in your chest. This feeling usually does not last long. Allergic reaction to the tracer. Shortness of breath or trouble breathing. What happens before the test? Ask your doctor about changing or stopping your normal medicines. Follow instructions from your doctor about what you cannot eat or drink. Remove your jewelry on the day of the test. Ask your doctor if you need to avoid nicotine or caffeine. What happens during the test? An IV tube will be inserted into one of your veins. Your doctor will give you a small amount of tracer through the IV tube. You will wait for 20-40 minutes while the tracer moves through your bloodstream. Your heart will be monitored with an electrocardiogram (ECG). You will lie down on an exam table. Pictures of your heart will be taken  for about 15-20 minutes. You may also have a stress test. For this test, one of these things may be done: You will be asked to exercise on a treadmill or a stationary bike. You will be given medicines that will make your heart work harder. This is done if you are unable to exercise. When blood flow to your heart has peaked, a tracer will again be given through the IV tube. After 20-40 minutes, you will get back on the exam table. More pictures will be taken of your heart. Depending on the tracer that is used, more pictures may need to be taken 3-4 hours later. Your IV tube will be removed when the test is over. The test may vary among doctors and hospitals. What happens after the test? Ask your doctor: Whether you can return to your normal schedule, including diet, activities, travel, and medicines. Whether you should drink more fluids. This will help to remove the tracer from your body. Ask your doctor, or the department that is doing the test: When will my results be ready? How will I get my results? What are my treatment options? What other  tests do I need? What are my next steps? This information is not intended to replace advice given to you by your health care provider. Make sure you discuss any questions you have with your health care provider. Document Revised: 06/11/2021 Document Reviewed: 06/11/2021 Elsevier Patient Education  2023 [employer].

## 2023-09-09 NOTE — Telephone Encounter (Signed)
 MPI instructions sent via [employer].

## 2023-09-15 ENCOUNTER — Other Ambulatory Visit: Payer: Self-pay | Admitting: Cardiology

## 2023-09-15 DIAGNOSIS — I2510 Atherosclerotic heart disease of native coronary artery without angina pectoris: Secondary | ICD-10-CM

## 2023-09-17 ENCOUNTER — Ambulatory Visit (HOSPITAL_COMMUNITY)
Admission: RE | Admit: 2023-09-17 | Discharge: 2023-09-17 | Disposition: A | Discharge status: Home / Self Care | Source: Ambulatory Visit | Attending: Cardiology | Admitting: Cardiology

## 2023-09-17 DIAGNOSIS — I2510 Atherosclerotic heart disease of native coronary artery without angina pectoris: Secondary | ICD-10-CM | POA: Insufficient documentation

## 2023-09-17 LAB — MYOCARDIAL PERFUSION IMAGING
LV dias vol: 118 mL (ref 62–150)
LV sys vol: 43 mL (ref 4.2–5.8)
Nuc Stress EF: 63 %
Peak HR: 114 {beats}/min
Rest HR: 59 {beats}/min
Rest Nuclear Isotope Dose: 10 mCi
SDS: 0
SRS: 7
SSS: 6
ST Depression (mm): 0 mm
Stress Nuclear Isotope Dose: 32.3 mCi
TID: 1.04

## 2023-09-17 MED ORDER — REGADENOSON 0.4 MG/5ML IV SOLN
INTRAVENOUS | Status: AC
Start: 2023-09-17 — End: 2023-09-17
  Filled 2023-09-17: qty 5

## 2023-09-17 MED ORDER — TECHNETIUM TC 99M TETROFOSMIN IV KIT
10.0000 | PACK | Freq: Once | INTRAVENOUS | Status: AC | PRN
  Administered 2023-09-17: 10 via INTRAVENOUS

## 2023-09-17 MED ORDER — TECHNETIUM TC 99M TETROFOSMIN IV KIT
32.3000 | PACK | Freq: Once | INTRAVENOUS | Status: AC | PRN
  Administered 2023-09-17: 32.3 via INTRAVENOUS

## 2023-09-17 MED ORDER — REGADENOSON 0.4 MG/5ML IV SOLN
0.4000 mg | Freq: Once | INTRAVENOUS | Status: AC
  Administered 2023-09-17: 0.4 mg via INTRAVENOUS

## 2023-09-19 NOTE — Patient Instructions (Signed)
 Abdominal Pain, Adult    Many things can cause belly (abdominal) pain. In most cases, belly pain is not a serious problem and can be watched and treated at home. But in some cases, it can be serious.  Your doctor will try to find the cause of your belly pain.  Follow these instructions at home:  Medicines  Take over-the-counter and prescription medicines only as told by your doctor.  Do not take medicines that help you poop (laxatives) unless told by your doctor.  General instructions  Watch your belly pain for any changes. Tell your doctor if the pain gets worse.  Drink enough fluid to keep your pee (urine) pale yellow.  Contact a doctor if:  Your belly pain changes or gets worse.  You have very bad cramping or bloating in your belly.  You vomit.  Your pain gets worse with meals, after eating, or with certain foods.  You have trouble pooping or have watery poop for more than 2-3 days.  You are not hungry, or you lose weight without trying.  You have signs of not getting enough fluid or water (dehydration). These may include:  Dark pee, very Chastain pee, or no pee.  Cracked lips or dry mouth.  Feeling sleepy or weak.  You have pain when you pee or poop.  Your belly pain wakes you up at night.  You have blood in your pee.  You have a fever.  Get help right away if:  You cannot stop vomiting.  Your pain is only in one part of your belly, like on the right side.  You have bloody or black poop, or poop that looks like tar.  You have trouble breathing.  You have chest pain.  These symptoms may be an emergency. Get help right away. Call 911.  Do not wait to see if the symptoms will go away.  Do not drive yourself to the hospital.  This information is not intended to replace advice given to you by your health care provider. Make sure you discuss any questions you have with your health care provider.  Document Revised: 10/30/2021 Document Reviewed: 10/30/2021  Elsevier Patient Education  2024 [employer].

## 2023-09-21 ENCOUNTER — Encounter: Payer: Self-pay | Admitting: Nurse Practitioner

## 2023-09-21 ENCOUNTER — Ambulatory Visit (INDEPENDENT_AMBULATORY_CARE_PROVIDER_SITE_OTHER): Admitting: Nurse Practitioner

## 2023-09-21 VITALS — BP 131/74 | HR 52 | Temp 97.6°F | Ht 69.5 in | Wt 209.0 lb

## 2023-09-21 DIAGNOSIS — R1012 Left upper quadrant pain: Secondary | ICD-10-CM | POA: Diagnosis not present

## 2023-09-21 DIAGNOSIS — Z800 Family history of malignant neoplasm of digestive organs: Secondary | ICD-10-CM | POA: Insufficient documentation

## 2023-09-21 NOTE — Assessment & Plan Note (Signed)
 In brother, sister, and cousins.  All passed away in their 70's.  He has had LUQ pain for 3 months coming and going.  Will obtain labs today and order imaging to further assess due to pain and family history.

## 2023-09-21 NOTE — Progress Notes (Addendum)
 BP 131/74   Pulse (!) 52   Temp 97.6 F (36.4 C) (Oral)   Ht 5' 9.5 (1.765 m)   Wt 209 lb (94.8 kg)   SpO2 97%   BMI 30.42 kg/m    Subjective:    Patient ID: Thomas Miller, male    DOB: [birth year], 73 y.o.   MRN: [id number]  HPI: Thomas Miller is a 73 y.o. male  Chief Complaint  Patient presents with   Abdominal Pain    Patient states he has been experiencing off and on pain in his left side under his rib area for the last 3 months. Patient states he feels the pain more at night when he lays down. States sometimes it feels sharp, sometimes it has a leaking feeling per patient.    ABDOMINAL PAIN  Presents for on and off pain to upper left quadrant which has been present for 3 months. Feels mostly at night when lies down, feels like something is leaking and sometimes a burning or sharp pain.  Has seen cardiology for follow-up and had work-up to assess, EF 63% and there is moderate calcifications notes on Myo.  Taking Losartan , Metoprolol , and Atorvastatin  + ASA. A1c in March was 6% -- denies polyuria, polyphagia.  Sister (77) and brother (78) passed from pancreatic cancer, plus some cousins.  Has not drank alcohol in 3 years.  No smoking or drug use. Duration:months Onset: sudden Severity: 0/10 at present == varies when present from 2-4/10 Quality: sharp, aching, and burning, leaking feeling Location:  LUQ  Episode duration: sometimes a few minutes and sometimes 30-40 minutes Radiation: no Frequency: intermittent Alleviating factors: nothing Aggravating factors: worse at night (does have episodes during day though) -- varies time of day when it presents no consistent underlying cause noted Status: fluctuating Treatments attempted: getting up and moving + drinking water Fever: no Nausea: no Vomiting: no Weight loss: no Decreased appetite: no Diarrhea: no Constipation: no -- has BM every day with no straining Blood in stool: no Heartburn: no Jaundice: no Rash:  no Dysuria/urinary frequency: no Hematuria: no Recurrent NSAID use: no   Relevant past medical, surgical, family and social history reviewed and updated as indicated. Interim medical history since our last visit reviewed. Allergies and medications reviewed and updated.  Review of Systems  Constitutional:  Negative for activity change, appetite change, diaphoresis, fatigue, fever and unexpected weight change.  Respiratory:  Positive for chest tightness (reason he is having cardiology testing) and shortness of breath (reason he is having cardiology testing). Negative for cough and wheezing.   Cardiovascular:  Positive for chest pain (reason he is having cardiology testing). Negative for palpitations and leg swelling.  Gastrointestinal:  Positive for abdominal pain. Negative for abdominal distention, blood in stool, constipation, diarrhea, nausea and vomiting.  Endocrine: Negative for polydipsia, polyphagia and polyuria.  Neurological: Negative.   Psychiatric/Behavioral: Negative.      Per HPI unless specifically indicated above     Objective:    BP 131/74   Pulse (!) 52   Temp 97.6 F (36.4 C) (Oral)   Ht 5' 9.5 (1.765 m)   Wt 209 lb (94.8 kg)   SpO2 97%   BMI 30.42 kg/m   Wt Readings from Last 3 Encounters:  09/21/23 209 lb (94.8 kg)  09/09/23 209 lb 12.8 oz (95.2 kg)  04/06/23 213 lb 3.2 oz (96.7 kg)    Physical Exam Vitals and nursing note reviewed.  Constitutional:      General: He  is awake. He is not in acute distress.    Appearance: He is well-developed and well-groomed. He is obese. He is not ill-appearing or toxic-appearing.  HENT:     Head: Normocephalic.     Right Ear: Hearing and external ear normal.     Left Ear: Hearing and external ear normal.  Eyes:     General: Lids are normal.     Extraocular Movements: Extraocular movements intact.     Conjunctiva/sclera: Conjunctivae normal.  Neck:     Thyroid : No thyromegaly.     Vascular: No carotid bruit.   Cardiovascular:     Rate and Rhythm: Regular rhythm. Bradycardia present.     Heart sounds: Normal heart sounds. No murmur heard.    No gallop.  Pulmonary:     Effort: No accessory muscle usage or respiratory distress.     Breath sounds: Normal breath sounds. No decreased breath sounds, wheezing or rales.  Abdominal:     General: Bowel sounds are normal. There is no distension.     Palpations: Abdomen is soft. There is no hepatomegaly or mass.     Tenderness: There is abdominal tenderness in the left upper quadrant. There is no right CVA tenderness, left CVA tenderness, guarding or rebound.     Hernia: No hernia is present.     Comments: To left upper quadrant and left lateral pain with palpation present, grimacing and jumped when palpated to area.  Musculoskeletal:     Cervical back: Full passive range of motion without pain.     Right lower leg: No edema.     Left lower leg: No edema.  Lymphadenopathy:     Cervical: No cervical adenopathy.  Skin:    General: Skin is warm.     Capillary Refill: Capillary refill takes less than 2 seconds.  Neurological:     Mental Status: He is alert and oriented to person, place, and time.     Deep Tendon Reflexes: Reflexes are normal and symmetric.     Reflex Scores:      Brachioradialis reflexes are 2+ on the right side and 2+ on the left side.      Patellar reflexes are 2+ on the right side and 2+ on the left side. Psychiatric:        Attention and Perception: Attention normal.        Mood and Affect: Mood normal.        Speech: Speech normal.        Behavior: Behavior normal. Behavior is cooperative.        Thought Content: Thought content normal.     Results for orders placed or performed during the hospital encounter of 09/17/23  MYOCARDIAL PERFUSION IMAGING   Collection Time: 09/17/23 10:42 AM  Result Value Ref Range   Rest Nuclear Isotope Dose 10.0 mCi   Stress Nuclear Isotope Dose 32.3 mCi   Rest HR 59.0 bpm   Rest BP 144/96  mmHg   Peak HR 114 bpm   Peak BP 159/81 mmHg   SSS 6.0    SRS 7.0    SDS 0.0    TID 1.04    LV sys vol 43.0 4.2 - 5.8 mL   LV dias vol 118.0 62 - 150 mL   Nuc Stress EF 63 %   ST Depression (mm) 0 mm      Assessment & Plan:   Problem List Items Addressed This Visit       Other   Family history  of pancreatic cancer   In brother, sister, and cousins.  All passed away in their 70's.  He has had LUQ pain for 3 months coming and going.  Will obtain labs today and order imaging to further assess due to pain and family history.      Relevant Orders   CT ABDOMEN PELVIS W WO CONTRAST   Abdominal pain, LUQ - Primary   Acute for 3 months, with significant family history of pancreatic cancer.  Tenderness on exam to LUQ. Will check labs: ESR, CRP, Amylase, Lipase, CBC, CMP, A1c.  Order for imaging due to his family history and length of pain being present.  No other red flag symptoms at present: is maintaining weight, appetite stable, and no N&V.  Determine next steps after all labs and imaging return.  For pain recommend heating pad as needed and Tylenol .      Relevant Orders   Lipase   Amylase   Comprehensive metabolic panel with GFR   CBC with Differential/Platelet   HgB A1c   C-reactive protein   Sed Rate (ESR)   CT ABDOMEN PELVIS W WO CONTRAST    Time:  25 minutes, >50% spent counseling/or care coordination   Follow up plan: Return in about 3 weeks (around 10/12/2023) for LUQ ABDOMINAL PAIN.

## 2023-09-21 NOTE — Assessment & Plan Note (Signed)
 Acute for 3 months, with significant family history of pancreatic cancer.  Tenderness on exam to LUQ. Will check labs: ESR, CRP, Amylase, Lipase, CBC, CMP, A1c.  Order for imaging due to his family history and length of pain being present.  No other red flag symptoms at present: is maintaining weight, appetite stable, and no N&V.  Determine next steps after all labs and imaging return.  For pain recommend heating pad as needed and Tylenol .

## 2023-09-22 ENCOUNTER — Ambulatory Visit: Payer: Self-pay | Admitting: Nurse Practitioner

## 2023-09-22 LAB — CBC WITH DIFFERENTIAL/PLATELET
Basophils Absolute: 0.1 x10E3/uL (ref 0.0–0.2)
Basos: 1 %
EOS (ABSOLUTE): 0.2 x10E3/uL (ref 0.0–0.4)
Eos: 2 %
Hematocrit: 50.8 % (ref 37.5–51.0)
Hemoglobin: 16.3 g/dL (ref 13.0–17.7)
Immature Grans (Abs): 0 x10E3/uL (ref 0.0–0.1)
Immature Granulocytes: 0 %
Lymphocytes Absolute: 2.2 x10E3/uL (ref 0.7–3.1)
Lymphs: 29 %
MCH: 28.9 pg (ref 26.6–33.0)
MCHC: 32.1 g/dL (ref 31.5–35.7)
MCV: 90 fL (ref 79–97)
Monocytes Absolute: 0.6 x10E3/uL (ref 0.1–0.9)
Monocytes: 8 %
Neutrophils Absolute: 4.5 x10E3/uL (ref 1.4–7.0)
Neutrophils: 60 %
Platelets: 188 x10E3/uL (ref 150–450)
RBC: 5.64 x10E6/uL (ref 4.14–5.80)
RDW: 13.6 % (ref 11.6–15.4)
WBC: 7.6 x10E3/uL (ref 3.4–10.8)

## 2023-09-22 LAB — COMPREHENSIVE METABOLIC PANEL WITH GFR
ALT: 27 IU/L (ref 0–44)
AST: 23 IU/L (ref 0–40)
Albumin: 4.8 g/dL (ref 3.8–4.8)
Alkaline Phosphatase: 103 IU/L (ref 44–121)
BUN/Creatinine Ratio: 13 (ref 10–24)
BUN: 14 mg/dL (ref 8–27)
Bilirubin Total: 0.8 mg/dL (ref 0.0–1.2)
CO2: 23 mmol/L (ref 20–29)
Calcium: 9.4 mg/dL (ref 8.6–10.2)
Chloride: 103 mmol/L (ref 96–106)
Creatinine, Ser: 1.08 mg/dL (ref 0.76–1.27)
Globulin, Total: 2.1 g/dL (ref 1.5–4.5)
Glucose: 76 mg/dL (ref 70–99)
Potassium: 4.3 mmol/L (ref 3.5–5.2)
Sodium: 142 mmol/L (ref 134–144)
Total Protein: 6.9 g/dL (ref 6.0–8.5)
eGFR: 72 mL/min/1.73 (ref >59)

## 2023-09-22 LAB — LIPASE: Lipase: 33 U/L (ref 13–78)

## 2023-09-22 LAB — SEDIMENTATION RATE: Sed Rate: 2 mm/h (ref 0–30)

## 2023-09-22 LAB — HEMOGLOBIN A1C
Est. average glucose Bld gHb Est-mCnc: 126 mg/dL
Hgb A1c MFr Bld: 6 % — ABNORMAL HIGH (ref 4.8–5.6)

## 2023-09-22 LAB — AMYLASE: Amylase: 51 U/L (ref 31–110)

## 2023-09-22 LAB — C-REACTIVE PROTEIN: CRP: <1 mg/L (ref 0–10)

## 2023-09-22 NOTE — Progress Notes (Signed)
 Contacted via MyChart  Good morning Thomas Miller, your labs have returned and overall remain stable.  Pancreas labs are normal.  Kidney function, creatinine and eGFR, remains normal, as is liver function, AST and ALT.  A1c remains at 6%, prediabetes level. Continue focus on diet and exercise.  I see you are scheduled for CT scan on the 29th, we will see what that shows.  Any questions? Keep being amazing!!  Thank you for allowing me to participate in your care.  I appreciate you. Kindest regards, Haris Baack

## 2023-09-23 ENCOUNTER — Ambulatory Visit: Payer: Self-pay | Admitting: Cardiology

## 2023-09-25 ENCOUNTER — Ambulatory Visit
Admission: RE | Admit: 2023-09-25 | Discharge: 2023-09-25 | Disposition: A | Discharge status: Home / Self Care | Source: Ambulatory Visit | Attending: Nurse Practitioner | Admitting: Nurse Practitioner

## 2023-09-25 DIAGNOSIS — Z800 Family history of malignant neoplasm of digestive organs: Secondary | ICD-10-CM | POA: Insufficient documentation

## 2023-09-25 DIAGNOSIS — R1012 Left upper quadrant pain: Secondary | ICD-10-CM | POA: Diagnosis not present

## 2023-09-25 DIAGNOSIS — K4090 Unilateral inguinal hernia, without obstruction or gangrene, not specified as recurrent: Secondary | ICD-10-CM | POA: Diagnosis not present

## 2023-09-25 MED ORDER — IOHEXOL 300 MG/ML  SOLN
100.0000 mL | Freq: Once | INTRAMUSCULAR | Status: AC | PRN
  Administered 2023-09-25: 100 mL via INTRAVENOUS

## 2023-09-25 NOTE — Progress Notes (Signed)
 Contacted via MyChart  Good afternoon Thomas Miller, your imaging returned and overall is reassuring.  Pancreas shows no acute findings. Prostate is enlarged, which is common as men age, and you have a small inguinal hernia on right side.  Other than that overall stable findings.  Any questions?

## 2023-09-30 ENCOUNTER — Ambulatory Visit: Admitting: Nurse Practitioner

## 2023-10-18 NOTE — Patient Instructions (Signed)
 Abdominal Pain, Adult    Many things can cause belly (abdominal) pain. In most cases, belly pain is not a serious problem and can be watched and treated at home. But in some cases, it can be serious.  Your doctor will try to find the cause of your belly pain.  Follow these instructions at home:  Medicines  Take over-the-counter and prescription medicines only as told by your doctor.  Do not take medicines that help you poop (laxatives) unless told by your doctor.  General instructions  Watch your belly pain for any changes. Tell your doctor if the pain gets worse.  Drink enough fluid to keep your pee (urine) pale yellow.  Contact a doctor if:  Your belly pain changes or gets worse.  You have very bad cramping or bloating in your belly.  You vomit.  Your pain gets worse with meals, after eating, or with certain foods.  You have trouble pooping or have watery poop for more than 2-3 days.  You are not hungry, or you lose weight without trying.  You have signs of not getting enough fluid or water (dehydration). These may include:  Dark pee, very Chastain pee, or no pee.  Cracked lips or dry mouth.  Feeling sleepy or weak.  You have pain when you pee or poop.  Your belly pain wakes you up at night.  You have blood in your pee.  You have a fever.  Get help right away if:  You cannot stop vomiting.  Your pain is only in one part of your belly, like on the right side.  You have bloody or black poop, or poop that looks like tar.  You have trouble breathing.  You have chest pain.  These symptoms may be an emergency. Get help right away. Call 911.  Do not wait to see if the symptoms will go away.  Do not drive yourself to the hospital.  This information is not intended to replace advice given to you by your health care provider. Make sure you discuss any questions you have with your health care provider.  Document Revised: 10/30/2021 Document Reviewed: 10/30/2021  Elsevier Patient Education  2024 [employer].

## 2023-10-21 ENCOUNTER — Encounter: Payer: Self-pay | Admitting: Nurse Practitioner

## 2023-10-21 ENCOUNTER — Ambulatory Visit (INDEPENDENT_AMBULATORY_CARE_PROVIDER_SITE_OTHER): Admitting: Nurse Practitioner

## 2023-10-21 VITALS — BP 123/68 | HR 51 | Temp 98.2°F | Resp 15 | Ht 69.49 in | Wt 207.2 lb

## 2023-10-21 DIAGNOSIS — R1012 Left upper quadrant pain: Secondary | ICD-10-CM

## 2023-10-21 MED ORDER — TIZANIDINE HCL 4 MG PO TABS: 4.0000 mg | ORAL_TABLET | Freq: Four times a day (QID) | ORAL | 0 refills | Status: AC | PRN

## 2023-10-21 NOTE — Progress Notes (Signed)
 BP 123/68 (BP Location: Left Arm, Patient Position: Sitting, Cuff Size: Normal)   Pulse (!) 51   Temp 98.2 F (36.8 C) (Oral)   Resp 15   Ht 5' 9.49 (1.765 m)   Wt 207 lb 3.2 oz (94 kg)   SpO2 98%   BMI 30.17 kg/m    Subjective:    Patient ID: Thomas Miller, male    DOB: 1950/03/24, 73 y.o.   MRN: [id number]  HPI: Thomas Miller is a 73 y.o. male  Chief Complaint  Patient presents with   Abdominal Pain    Good today, not improving about the same since last visit. Pain has not changed or moved, mostly still the nighttime discomfort when laying on his left side is the worst.    ABDOMINAL PAIN  Follow-up for visit on 09/21/23. Pain to left mid and upper abdomen.  We did CT imaging, overall reassuring + labs reassuring. Taking Losartan , Metoprolol , and Atorvastatin  + ASA. A1c in August was 6% -- denies polyuria, polyphagia. Notices pain most at night when rolling over on the left side. Played golf yesterday and walked 7 miles with no pain.  Sister (77) and brother (78) passed from pancreatic cancer, plus some cousins.  Has not drank alcohol in 3 years.  No smoking or drug use. Duration:months Onset: sudden Severity: 0/10 at present == varies when present from 3-4/10 Quality: sharp, aching, and burning, leaking feeling Location:  LUQ  Episode duration: sometimes a few minutes and other time 30-40 minutes Radiation: no Frequency: intermittent Alleviating factors: nothing Aggravating factors: worse at night (does have episodes during day though) Status: fluctuating Treatments attempted: getting up and moving + drinking water Fever: no Nausea: no Vomiting: no Weight loss: no Decreased appetite: no Diarrhea: no Constipation: no -- BM every day with no straining Blood in stool: no Heartburn: no Jaundice: no Rash: no Dysuria/urinary frequency: no Hematuria: no Recurrent NSAID use: no   Relevant past medical, surgical, family and social history reviewed and updated as  indicated. Interim medical history since our last visit reviewed. Allergies and medications reviewed and updated.  Review of Systems  Constitutional:  Negative for activity change, appetite change, diaphoresis, fatigue, fever and unexpected weight change.  Respiratory:  Positive for chest tightness (reason he is having cardiology testing) and shortness of breath (reason he is having cardiology testing). Negative for cough and wheezing.   Cardiovascular:  Positive for chest pain (reason he is having cardiology testing). Negative for palpitations and leg swelling.  Gastrointestinal:  Positive for abdominal pain. Negative for abdominal distention, blood in stool, constipation, diarrhea, nausea and vomiting.  Endocrine: Negative for polydipsia, polyphagia and polyuria.  Neurological: Negative.   Psychiatric/Behavioral: Negative.      Per HPI unless specifically indicated above     Objective:    BP 123/68 (BP Location: Left Arm, Patient Position: Sitting, Cuff Size: Normal)   Pulse (!) 51   Temp 98.2 F (36.8 C) (Oral)   Resp 15   Ht 5' 9.49 (1.765 m)   Wt 207 lb 3.2 oz (94 kg)   SpO2 98%   BMI 30.17 kg/m   Wt Readings from Last 3 Encounters:  10/21/23 207 lb 3.2 oz (94 kg)  09/21/23 209 lb (94.8 kg)  09/09/23 209 lb 12.8 oz (95.2 kg)    Physical Exam Vitals and nursing note reviewed.  Constitutional:      General: He is awake. He is not in acute distress.    Appearance: He is  well-developed and well-groomed. He is obese. He is not ill-appearing or toxic-appearing.  HENT:     Head: Normocephalic.     Right Ear: Hearing and external ear normal.     Left Ear: Hearing and external ear normal.  Eyes:     General: Lids are normal.     Extraocular Movements: Extraocular movements intact.     Conjunctiva/sclera: Conjunctivae normal.  Neck:     Thyroid : No thyromegaly.     Vascular: No carotid bruit.  Cardiovascular:     Rate and Rhythm: Regular rhythm. Bradycardia present.      Heart sounds: Normal heart sounds. No murmur heard.    No gallop.  Pulmonary:     Effort: No accessory muscle usage or respiratory distress.     Breath sounds: Normal breath sounds. No decreased breath sounds, wheezing or rales.  Abdominal:     General: Bowel sounds are normal. There is no distension.     Palpations: Abdomen is soft. There is no hepatomegaly or mass.     Tenderness: There is abdominal tenderness. There is no right CVA tenderness, left CVA tenderness, guarding or rebound.     Hernia: No hernia is present.     Comments: To left lateral quadrant with palpation present, jumped slightly when palpated to area. No tenderness to other areas.  Musculoskeletal:     Cervical back: Full passive range of motion without pain.     Right lower leg: No edema.     Left lower leg: No edema.  Lymphadenopathy:     Cervical: No cervical adenopathy.  Skin:    General: Skin is warm.     Capillary Refill: Capillary refill takes less than 2 seconds.  Neurological:     Mental Status: He is alert and oriented to person, place, and time.     Deep Tendon Reflexes: Reflexes are normal and symmetric.     Reflex Scores:      Brachioradialis reflexes are 2+ on the right side and 2+ on the left side.      Patellar reflexes are 2+ on the right side and 2+ on the left side. Psychiatric:        Attention and Perception: Attention normal.        Mood and Affect: Mood normal.        Speech: Speech normal.        Behavior: Behavior normal. Behavior is cooperative.        Thought Content: Thought content normal.     Results for orders placed or performed in visit on 09/21/23  Lipase   Collection Time: 09/21/23  9:10 AM  Result Value Ref Range   Lipase 33 13 - 78 U/L  Amylase   Collection Time: 09/21/23  9:10 AM  Result Value Ref Range   Amylase 51 31 - 110 U/L  Comprehensive metabolic panel with GFR   Collection Time: 09/21/23  9:10 AM  Result Value Ref Range   Glucose 76 70 - 99 mg/dL   BUN  14 8 - 27 mg/dL   Creatinine, Ser 8.91 0.76 - 1.27 mg/dL   eGFR 72 >40 fO/fpw/8.26   BUN/Creatinine Ratio 13 10 - 24   Sodium 142 134 - 144 mmol/L   Potassium 4.3 3.5 - 5.2 mmol/L   Chloride 103 96 - 106 mmol/L   CO2 23 20 - 29 mmol/L   Calcium  9.4 8.6 - 10.2 mg/dL   Total Protein 6.9 6.0 - 8.5 g/dL   Albumin  4.8 3.8 -  4.8 g/dL   Globulin, Total 2.1 1.5 - 4.5 g/dL   Bilirubin Total 0.8 0.0 - 1.2 mg/dL   Alkaline Phosphatase 103 44 - 121 IU/L   AST 23 0 - 40 IU/L   ALT 27 0 - 44 IU/L  CBC with Differential/Platelet   Collection Time: 09/21/23  9:10 AM  Result Value Ref Range   WBC 7.6 3.4 - 10.8 x10E3/uL   RBC 5.64 4.14 - 5.80 x10E6/uL   Hemoglobin 16.3 13.0 - 17.7 g/dL   Hematocrit 49.1 62.4 - 51.0 %   MCV 90 79 - 97 fL   MCH 28.9 26.6 - 33.0 pg   MCHC 32.1 31.5 - 35.7 g/dL   RDW 86.3 88.3 - 84.5 %   Platelets 188 150 - 450 x10E3/uL   Neutrophils 60 Not Estab. %   Lymphs 29 Not Estab. %   Monocytes 8 Not Estab. %   Eos 2 Not Estab. %   Basos 1 Not Estab. %   Neutrophils Absolute 4.5 1.4 - 7.0 x10E3/uL   Lymphocytes Absolute 2.2 0.7 - 3.1 x10E3/uL   Monocytes Absolute 0.6 0.1 - 0.9 x10E3/uL   EOS (ABSOLUTE) 0.2 0.0 - 0.4 x10E3/uL   Basophils Absolute 0.1 0.0 - 0.2 x10E3/uL   Immature Granulocytes 0 Not Estab. %   Immature Grans (Abs) 0.0 0.0 - 0.1 x10E3/uL  HgB A1c   Collection Time: 09/21/23  9:10 AM  Result Value Ref Range   Hgb A1c MFr Bld 6.0 (H) 4.8 - 5.6 %   Est. average glucose Bld gHb Est-mCnc 126 mg/dL  C-reactive protein   Collection Time: 09/21/23  9:10 AM  Result Value Ref Range   CRP <1 0 - 10 mg/L  Sed Rate (ESR)   Collection Time: 09/21/23  9:10 AM  Result Value Ref Range   Sed Rate 2 0 - 30 mm/hr      Assessment & Plan:   Problem List Items Addressed This Visit       Other   Abdominal pain, LUQ - Primary   Ongoing, often at night with lying on side, improves with moving off left side.  Occasional during day.  Recent imaging reassuring and  labs overall stable.  ?if more muscular in nature.  Will trial Tizanidine  as needed, recommend he not take while driving or doing activities.  Place Voltaren gel to side before bedtime and if pain presents.  Educated him on this plan.  He returns in 4 weeks, if pain continues may send to GI.        Follow up plan: Return for as scheduled at end of October.

## 2023-10-21 NOTE — Assessment & Plan Note (Signed)
 Ongoing, often at night with lying on side, improves with moving off left side.  Occasional during day.  Recent imaging reassuring and labs overall stable.  ?if more muscular in nature.  Will trial Tizanidine  as needed, recommend he not take while driving or doing activities.  Place Voltaren gel to side before bedtime and if pain presents.  Educated him on this plan.  He returns in 4 weeks, if pain continues may send to GI.

## 2023-10-22 ENCOUNTER — Other Ambulatory Visit: Payer: Self-pay | Admitting: Nurse Practitioner

## 2023-10-22 DIAGNOSIS — K219 Gastro-esophageal reflux disease without esophagitis: Secondary | ICD-10-CM

## 2023-10-23 NOTE — Telephone Encounter (Signed)
 Requested Prescriptions  Pending Prescriptions Disp Refills   gabapentin  (NEURONTIN ) 100 MG capsule [Pharmacy Med Name: GABAPENTIN  100 MG CAPSULE] 90 capsule 0    Sig: Take 1 capsule (100 mg total) by mouth at bedtime.     Neurology: Anticonvulsants - gabapentin  Passed - 10/23/2023 12:25 PM      Passed - Cr in normal range and within 360 days    Creatinine, Ser  Date Value Ref Range Status  09/21/2023 1.08 0.76 - 1.27 mg/dL Final         Passed - Completed PHQ-2 or PHQ-9 in the last 360 days      Passed - Valid encounter within last 12 months    Recent Outpatient Visits           2 days ago Abdominal pain, LUQ   [place] [facility] [place], [place] T, NP   1 month ago Abdominal pain, LUQ   [place] [facility] [place], [place] T, NP   6 months ago PAF (paroxysmal atrial fibrillation) (HCC)   [facility] [place], [place] T, NP               esomeprazole  (NEXIUM ) 40 MG capsule [Pharmacy Med Name: ESOMEPRAZOLE  MAG DR 40 MG CAP] 90 capsule 0    Sig: Take 1 capsule (40 mg total) by mouth daily before breakfast.     Gastroenterology: Proton Pump Inhibitors 2 Passed - 10/23/2023 12:25 PM      Passed - ALT in normal range and within 360 days    ALT  Date Value Ref Range Status  09/21/2023 27 0 - 44 IU/L Final   ALT (SGPT) Piccolo, Waived  Date Value Ref Range Status  06/03/2017 37 10 - 47 U/L Final         Passed - AST in normal range and within 360 days    AST  Date Value Ref Range Status  09/21/2023 23 0 - 40 IU/L Final   AST (SGOT) Piccolo, Waived  Date Value Ref Range Status  06/03/2017 35 11 - 38 U/L Final         Passed - Valid encounter within last 12 months    Recent Outpatient Visits           2 days ago Abdominal pain, LUQ   [place] [facility] [place], [place] T, NP   1 month ago Abdominal pain, LUQ   [place] [facility] [place], [place] T, NP   6 months ago PAF  (paroxysmal atrial fibrillation) (HCC)   [place] [facility] [place], Melanie DASEN, NP

## 2023-11-05 DIAGNOSIS — H02403 Unspecified ptosis of bilateral eyelids: Secondary | ICD-10-CM | POA: Diagnosis not present

## 2023-11-25 NOTE — Patient Instructions (Signed)
 Be Involved in Caring For Your Health:  Taking Medications When medications are taken as directed, they can greatly improve your health. But if they are not taken as prescribed, they may not work. In some cases, not taking them correctly can be harmful. To help ensure your treatment remains effective and safe, understand your medications and how to take them. Bring your medications to each visit for review by your provider.  Your lab results, notes, and after visit summary will be available on My Chart. We strongly encourage you to use this feature. If lab results are abnormal the clinic will contact you with the appropriate steps. If the clinic does not contact you assume the results are satisfactory. You can always view your results on My Chart. If you have questions regarding your health or results, please contact the clinic during office hours. You can also ask questions on My Chart.  We at [facility] are grateful that you chose [place] to provide your care. We strive to provide evidence-based and compassionate care and are always looking for feedback. If you get a survey from the clinic please complete this so we can hear your opinions.  Heart-Healthy Eating Plan Many factors influence your heart health, including eating and exercise habits. Heart health is also called coronary health. Coronary risk increases with abnormal blood fat (lipid) levels. A heart-healthy eating plan includes limiting unhealthy fats, increasing healthy fats, limiting salt (sodium) intake, and making other diet and lifestyle changes. What is my plan? Your health care provider may recommend that: You limit your fat intake to _________% or less of your total calories each day. You limit your saturated fat intake to _________% or less of your total calories each day. You limit the amount of cholesterol in your diet to less than _________ mg per day. You limit the amount of sodium in your diet to less than _________  mg per day. What are tips for following this plan? Cooking Cook foods using methods other than frying. Baking, boiling, grilling, and broiling are all good options. Other ways to reduce fat include: Removing the skin from poultry. Removing all visible fats from meats. Steaming vegetables in water or broth. Meal planning  At meals, imagine dividing your plate into fourths: Fill one-half of your plate with vegetables and green salads. Fill one-fourth of your plate with whole grains. Fill one-fourth of your plate with lean protein foods. Eat 2-4 cups of vegetables per day. One cup of vegetables equals 1 cup (91 g) broccoli or cauliflower florets, 2 medium carrots, 1 large bell pepper, 1 large sweet potato, 1 large tomato, 1 medium white potato, 2 cups (150 g) raw leafy greens. Eat 1-2 cups of fruit per day. One cup of fruit equals 1 small apple, 1 large banana, 1 cup (237 g) mixed fruit, 1 large orange,  cup (82 g) dried fruit, 1 cup (240 mL) 100% fruit juice. Eat more foods that contain soluble fiber. Examples include apples, broccoli, carrots, beans, peas, and barley. Aim to get 25-30 g of fiber per day. Increase your consumption of legumes, nuts, and seeds to 4-5 servings per week. One serving of dried beans or legumes equals  cup (90 g) cooked, 1 serving of nuts is  oz (12 almonds, 24 pistachios, or 7 walnut halves), and 1 serving of seeds equals  oz (8 g). Fats Choose healthy fats more often. Choose monounsaturated and polyunsaturated fats, such as olive and canola oils, avocado oil, flaxseeds, walnuts, almonds, and seeds. Eat  more omega-3 fats. Choose salmon, mackerel, sardines, tuna, flaxseed oil, and ground flaxseeds. Aim to eat fish at least 2 times each week. Check food labels carefully to identify foods with trans fats or high amounts of saturated fat. Limit saturated fats. These are found in animal products, such as meats, butter, and cream. Plant sources of saturated fats  include palm oil, palm kernel oil, and coconut oil. Avoid foods with partially hydrogenated oils in them. These contain trans fats. Examples are stick margarine, some tub margarines, cookies, crackers, and other baked goods. Avoid fried foods. General information Eat more home-cooked food and less restaurant, buffet, and fast food. Limit or avoid alcohol. Limit foods that are high in added sugar and simple starches such as foods made using white refined flour (white breads, pastries, sweets). Lose weight if you are overweight. Losing just 5-10% of your body weight can help your overall health and prevent diseases such as diabetes and heart disease. Monitor your sodium intake, especially if you have high blood pressure. Talk with your health care provider about your sodium intake. Try to incorporate more vegetarian meals weekly. What foods should I eat? Fruits All fresh, canned (in natural juice), or frozen fruits. Vegetables Fresh or frozen vegetables (raw, steamed, roasted, or grilled). Green salads. Grains Most grains. Choose whole wheat and whole grains most of the time. Rice and pasta, including brown rice and pastas made with whole wheat. Meats and other proteins Lean, well-trimmed beef, veal, pork, and lamb. Chicken and [place] without skin. All fish and shellfish. Wild duck, rabbit, pheasant, and venison. Egg whites or low-cholesterol egg substitutes. Dried beans, peas, lentils, and tofu. Seeds and most nuts. Dairy Low-fat or nonfat cheeses, including ricotta and mozzarella. Skim or 1% milk (liquid, powdered, or evaporated). Buttermilk made with low-fat milk. Nonfat or low-fat yogurt. Fats and oils Non-hydrogenated (trans-free) margarines. Vegetable oils, including soybean, sesame, sunflower, olive, avocado, peanut, safflower, corn, canola, and cottonseed. Salad dressings or mayonnaise made with a vegetable oil. Beverages Water (mineral or sparkling). Coffee and tea. Unsweetened ice  tea. Diet beverages. Sweets and desserts Sherbet, gelatin, and fruit ice. Small amounts of dark chocolate. Limit all sweets and desserts. Seasonings and condiments All seasonings and condiments. The items listed above may not be a complete list of foods and beverages you can eat. Contact a dietitian for more options. What foods should I avoid? Fruits Canned fruit in heavy syrup. Fruit in cream or butter sauce. Fried fruit. Limit coconut. Vegetables Vegetables cooked in cheese, cream, or butter sauce. Fried vegetables. Grains Breads made with saturated or trans fats, oils, or whole milk. Croissants. Sweet rolls. Donuts. High-fat crackers, such as cheese crackers and chips. Meats and other proteins Fatty meats, such as hot dogs, ribs, sausage, bacon, rib-eye roast or steak. High-fat deli meats, such as salami and bologna. Caviar. Domestic duck and goose. Organ meats, such as liver. Dairy Cream, sour cream, cream cheese, and creamed cottage cheese. Whole-milk cheeses. Whole or 2% milk (liquid, evaporated, or condensed). Whole buttermilk. Cream sauce or high-fat cheese sauce. Whole-milk yogurt. Fats and oils Meat fat, or shortening. Cocoa butter, hydrogenated oils, palm oil, coconut oil, palm kernel oil. Solid fats and shortenings, including bacon fat, salt pork, lard, and butter. Nondairy cream substitutes. Salad dressings with cheese or sour cream. Beverages Regular sodas and any drinks with added sugar. Sweets and desserts Frosting. Pudding. Cookies. Cakes. Pies. Milk chocolate or white chocolate. Buttered syrups. Full-fat ice cream or ice cream drinks. The items listed above may  not be a complete list of foods and beverages to avoid. Contact a dietitian for more information. Summary Heart-healthy meal planning includes limiting unhealthy fats, increasing healthy fats, limiting salt (sodium) intake and making other diet and lifestyle changes. Lose weight if you are overweight. Losing just  5-10% of your body weight can help your overall health and prevent diseases such as diabetes and heart disease. Focus on eating a balance of foods, including fruits and vegetables, low-fat or nonfat dairy, lean protein, nuts and legumes, whole grains, and heart-healthy oils and fats. This information is not intended to replace advice given to you by your health care provider. Make sure you discuss any questions you have with your health care provider. Document Revised: 02/18/2021 Document Reviewed: 02/18/2021 Elsevier Patient Education  2024 [employer].

## 2023-11-27 ENCOUNTER — Encounter: Payer: Self-pay | Admitting: Nurse Practitioner

## 2023-11-27 ENCOUNTER — Ambulatory Visit (INDEPENDENT_AMBULATORY_CARE_PROVIDER_SITE_OTHER): Admitting: Nurse Practitioner

## 2023-11-27 VITALS — BP 128/73 | HR 56 | Temp 97.9°F | Resp 14 | Ht 69.0 in | Wt 208.6 lb

## 2023-11-27 DIAGNOSIS — I480 Paroxysmal atrial fibrillation: Secondary | ICD-10-CM | POA: Diagnosis not present

## 2023-11-27 DIAGNOSIS — E782 Mixed hyperlipidemia: Secondary | ICD-10-CM | POA: Diagnosis not present

## 2023-11-27 DIAGNOSIS — M1A0720 Idiopathic chronic gout, left ankle and foot, without tophus (tophi): Secondary | ICD-10-CM

## 2023-11-27 DIAGNOSIS — M1A0710 Idiopathic chronic gout, right ankle and foot, without tophus (tophi): Secondary | ICD-10-CM

## 2023-11-27 DIAGNOSIS — I10 Essential (primary) hypertension: Secondary | ICD-10-CM

## 2023-11-27 DIAGNOSIS — I25118 Atherosclerotic heart disease of native coronary artery with other forms of angina pectoris: Secondary | ICD-10-CM

## 2023-11-27 DIAGNOSIS — Z6830 Body mass index (BMI) 30.0-30.9, adult: Secondary | ICD-10-CM

## 2023-11-27 DIAGNOSIS — Z0000 Encounter for general adult medical examination without abnormal findings: Secondary | ICD-10-CM | POA: Diagnosis not present

## 2023-11-27 DIAGNOSIS — N400 Enlarged prostate without lower urinary tract symptoms: Secondary | ICD-10-CM

## 2023-11-27 DIAGNOSIS — R7309 Other abnormal glucose: Secondary | ICD-10-CM

## 2023-11-27 DIAGNOSIS — K219 Gastro-esophageal reflux disease without esophagitis: Secondary | ICD-10-CM

## 2023-11-27 MED ORDER — ALLOPURINOL 300 MG PO TABS: 300.0000 mg | ORAL_TABLET | Freq: Every day | ORAL | 4 refills | Status: AC

## 2023-11-27 MED ORDER — METOPROLOL TARTRATE 25 MG PO TABS: 25.0000 mg | ORAL_TABLET | Freq: Two times a day (BID) | ORAL | 4 refills | Status: AC

## 2023-11-27 MED ORDER — LOSARTAN POTASSIUM 25 MG PO TABS: 25.0000 mg | ORAL_TABLET | Freq: Every day | ORAL | 4 refills | Status: AC

## 2023-11-27 MED ORDER — ATORVASTATIN CALCIUM 80 MG PO TABS: 80.0000 mg | ORAL_TABLET | Freq: Every day | ORAL | 4 refills | Status: AC

## 2023-11-27 NOTE — Assessment & Plan Note (Signed)
 Chronic, stable.  No recent NTG use, monitor closely and if increased use to alert PCP or cardiology.  Continue collaboration with cardiology, recent notes reviewed.

## 2023-11-27 NOTE — Assessment & Plan Note (Signed)
 Chronic, ongoing.  Continue current medication regimen and adjust as needed. Lipid panel today.

## 2023-11-27 NOTE — Assessment & Plan Note (Signed)
BMI 30.80.  Recommended eating smaller high protein, low fat meals more frequently and exercising 30 mins a day 5 times a week with a goal of 10-15lb weight loss in the next 3 months. Patient voiced their understanding and motivation to adhere to these recommendations. ? ?

## 2023-11-27 NOTE — Assessment & Plan Note (Signed)
 A1c 6% last check in August 2025, monitor closely and continue diet/exercise focus at home.  Discussed at length with patient.  Recheck A1c next visit.

## 2023-11-27 NOTE — Assessment & Plan Note (Addendum)
 Chronic, stable on daily Nexium .  Continue current regimen and adjust as needed.  Mag level annually. Obtain next visit.

## 2023-11-27 NOTE — Assessment & Plan Note (Signed)
 Chronic, stable.  BP at goal on recheck and on home checks.  Recommend he continue to monitor BP at home daily + document and bring to provider visits.  Continue current medication regimen at this time and adjust as needed. LAB: CBC, CMP, TSH.  Focus on DASH diet at home.

## 2023-11-27 NOTE — Assessment & Plan Note (Signed)
Asymptomatic with no current medications.  Will check PSA level today, discussed with patient -- he wishes to continue screening.

## 2023-11-27 NOTE — Progress Notes (Signed)
 BP 128/73 (BP Location: Left Arm, Patient Position: Sitting, Cuff Size: Normal)   Pulse (!) 56   Temp 97.9 F (36.6 C) (Oral)   Resp 14   Ht 5' 9 (1.753 m)   Wt 208 lb 9.6 oz (94.6 kg)   SpO2 97%   BMI 30.80 kg/m    Subjective:    Patient ID: Thomas Miller, male    DOB: 01-Apr-1950, 73 y.o.   MRN: [id number]  HPI: Thomas Miller is a 73 y.o. male presenting on 11/27/2023 for comprehensive medical examination. Current medical complaints include:none  He currently lives with: self Interim Problems from his last visit: no  Taking Nexium  for GERD, well controlled, and Allopurinol  for gout with no recent flares.  HYPERTENSION / HYPERLIPIDEMIA + CAD Continues on Losartan , Metoprolol , Lipitor .  History of several mini strokes per patient. Saw cardiology last on 09/09/23. EF 63% 09/17/23. Satisfied with current treatment? yes Duration of hypertension: chronic BP monitoring frequency: daily BP range: 127/87 this morning -- ranges 120/70 range BP medication side effects: no Duration of hyperlipidemia: chronic Cholesterol medication side effects: no Cholesterol supplements: none Medication compliance: good compliance Aspirin : yes Recent stressors: no Recurrent headaches: no Visual changes: no Palpitations: no Dyspnea: no  Chest pain: no -- last used NTG abut one month ago Lower extremity edema: no Dizzy/lightheaded: no  The ASCVD Risk score (Arnett DK, et al., 2019) failed to calculate for the following reasons:   Risk score cannot be calculated because patient has a medical history suggesting prior/existing ASCVD    ATRIAL FIBRILLATION Currently not on anticoagulant. Does take Baby ASA. Atrial fibrillation status: stable Satisfied with current treatment: yes  Medication side effects:  no Medication compliance: good compliance Etiology of atrial fibrillation:  Palpitations:  no Chest pain:  as above Dyspnea on exertion:  no Orthopnea:  no Syncope:  no Edema:   no Ventricular rate control: B-blocker Anti-coagulation:  Baby ASA   Impaired Fasting Glucose HbA1C:  Lab Results  Component Value Date   HGBA1C 6.0 (H) 09/21/2023  Duration of elevated blood sugar: chronic Polydipsia: no Polyuria: no Weight change: no Visual disturbance: no Glucose Monitoring: no    Accucheck frequency: Not Checking    Fasting glucose:     Post prandial:  Diabetic Education: Not Completed Family history of diabetes: mother and great aunt   Functional Status Survey: Is the patient deaf or have difficulty hearing?: No Does the patient have difficulty seeing, even when wearing glasses/contacts?: No Does the patient have difficulty concentrating, remembering, or making decisions?: No Does the patient have difficulty walking or climbing stairs?: No Does the patient have difficulty dressing or bathing?: No Does the patient have difficulty doing errands alone such as visiting a doctor's office or shopping?: No  FALL RISK:    11/27/2023    8:19 AM 10/21/2023    1:21 PM 04/06/2023    8:53 AM 11/24/2022    6:03 PM 10/13/2022    8:42 AM  Fall Risk   Falls in the past year? 0 0 0 0 0  Number falls in past yr: 0 0 0 0 0  Injury with Fall? 0 0 0 0 0  Risk for fall due to : No Fall Risks No Fall Risks No Fall Risks No Fall Risks No Fall Risks  Follow up Falls evaluation completed Falls evaluation completed Falls evaluation completed Falls prevention discussed Falls evaluation completed   Depression Screen    11/27/2023    8:19 AM 10/21/2023  1:21 PM 04/06/2023    8:53 AM 11/25/2022    3:47 PM 10/13/2022    8:42 AM  Depression screen PHQ 2/9  Decreased Interest 0 0 0 0 0  Down, Depressed, Hopeless 0 0 0 0 0  PHQ - 2 Score 0 0 0 0 0  Altered sleeping 0 0 0 0 0  Tired, decreased energy 0 0 0 0 0  Change in appetite 0 0 0 0 0  Feeling bad or failure about yourself  0 0 0 0 0  Trouble concentrating 0 0 0 0 0  Moving slowly or fidgety/restless 0 0 0 0 0   Suicidal thoughts 0 0 0 0 0  PHQ-9 Score 0 0 0 0 0  Difficult doing work/chores   Not difficult at all Not difficult at all Not difficult at all      11/27/2023    8:20 AM 10/21/2023    1:22 PM 04/06/2023    8:53 AM 10/13/2022    8:44 AM  GAD 7 : Generalized Anxiety Score  Nervous, Anxious, on Edge 0 0 0 0  Control/stop worrying 0 0 0 0  Worry too much - different things 0 0 0 0  Trouble relaxing 0 0 0 0  Restless 0 0 0 0  Easily annoyed or irritable 0 0 0 0  Afraid - awful might happen 0 0 0 0  Total GAD 7 Score 0 0 0 0  Anxiety Difficulty   Not difficult at all Not difficult at all    Past Medical History:  Past Medical History:  Diagnosis Date   Advanced care planning/counseling discussion 11/27/2016   Allergy 1986   Aortic atherosclerosis 03/24/2020   Noted on CT 03/01/19   Arthritis 1992   Body mass index (BMI) 30.0-30.9, adult 12/11/2020   BPH (benign prostatic hyperplasia) 10/25/2014   CAD (coronary artery disease) 08/07/2017   Cataract 2019   Chronic low back pain without sciatica 11/21/2020   Elevated hemoglobin A1c measurement 04/06/2022   Essential (primary) hypertension 10/25/2014   GERD without esophagitis 10/25/2014   Gout    Hearing loss    History of kidney stones    History of stroke 10/05/2019   Hyperlipemia 10/25/2014   Insomnia 05/19/2016   Myocardial infarction (HCC) 2019   PAF (paroxysmal atrial fibrillation) (HCC) 08/07/2017   Pain of right heel 04/06/2023   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   S/P epidural steroid injection 05/2018   Stroke (HCC) 2021    Surgical History:  Past Surgical History:  Procedure Laterality Date   BACK SURGERY     CHOLECYSTECTOMY  1996   CORONARY ARTERY BYPASS GRAFT N/A 07/20/2017   Procedure: CORONARY ARTERY BYPASS GRAFTING (CABG) times four using left internal mammary artery to LAD and endoscopically harvested right saphenous vein graft to distal circumflex, intermediate, and PD;  Surgeon: Army Dallas NOVAK, MD;   Location: [facility] [place] OR;  Service: Open Heart Surgery;  Laterality: N/A;   EPIDURAL BLOCK INJECTION  05/2018   L3injections    EYE SURGERY Bilateral 2013   FOOT SURGERY Bilateral    ingrown toenails   GALLBLADDER SURGERY     LEFT HEART CATH AND CORONARY ANGIOGRAPHY N/A 07/17/2017   Procedure: LEFT HEART CATH AND CORONARY ANGIOGRAPHY;  Surgeon: Mady Bruckner, MD;  Location: [facility] INVASIVE CV LAB;  Service: Cardiovascular;  Laterality: N/A;   SHOULDER SURGERY Right    SPINE SURGERY      Medications:  Current Outpatient Medications on File Prior to Visit  Medication Sig   Auletta Cider Vinegar 600 MG CAPS Take 600 mg by mouth daily.   aspirin  EC 81 MG tablet Take 2 tablets (162 mg total) by mouth daily.   esomeprazole  (NEXIUM ) 40 MG capsule Take 1 capsule (40 mg total) by mouth daily before breakfast.   Liniments (DEEP BLUE RELIEF) GEL Apply 1 application topically as needed for pain (joints). In joints   nitroGLYCERIN  (NITROSTAT ) 0.4 MG SL tablet Place 1 tablet (0.4 mg total) under the tongue every 5 (five) minutes as needed for chest pain.   tiZANidine  (ZANAFLEX ) 4 MG tablet Take 1 tablet (4 mg total) by mouth every 6 (six) hours as needed for muscle spasms.   VITAMIN D, CHOLECALCIFEROL, PO Take 2,000 mg by mouth daily.   No current facility-administered medications on file prior to visit.    Allergies:  Allergies  Allergen Reactions   Doxycycline Rash   Ace Inhibitors Cough    cough   Codeine Other (See Comments)    Insomnia   Naprosyn [Naproxen] Rash    Social History:  Social History   Socioeconomic History   Marital status: Widowed    Spouse name: Not on file   Number of children: 2   Years of education: Not on file   Highest education level: Associate degree: occupational, scientist, product/process development, or vocational program  Occupational History   Occupation: retired  Tobacco Use   Smoking status: Never   Smokeless tobacco: Never  Vaping Use   Vaping status: Never Used  Substance and  Sexual Activity   Alcohol use: Never    Comment: Occasional glass of wine less than monthly   Drug use: No   Sexual activity: Not Currently  Other Topics Concern   Not on file  Social History Narrative   Volunteers and golfs    Social Drivers of Health   Financial Resource Strain: Low Risk  (11/23/2023)   Overall Financial Resource Strain (CARDIA)    Difficulty of Paying Living Expenses: Not hard at all  Food Insecurity: No Food Insecurity (11/23/2023)   Hunger Vital Sign    Worried About Running Out of Food in the Last Year: Never true    Ran Out of Food in the Last Year: Never true  Transportation Needs: No Transportation Needs (11/23/2023)   PRAPARE - Administrator, Civil Service (Medical): No    Lack of Transportation (Non-Medical): No  Physical Activity: Sufficiently Active (11/23/2023)   Exercise Vital Sign    Days of Exercise per Week: 5 days    Minutes of Exercise per Session: 40 min  Stress: No Stress Concern Present (11/23/2023)   [employer] of Occupational Health - Occupational Stress Questionnaire    Feeling of Stress: Not at all  Social Connections: Moderately Integrated (11/23/2023)   Social Connection and Isolation Panel    Frequency of Communication with Friends and Family: More than three times a week    Frequency of Social Gatherings with Friends and Family: Three times a week    Attends Religious Services: More than 4 times per year    Active Member of Clubs or Organizations: Yes    Attends Banker Meetings: More than 4 times per year    Marital Status: Widowed  Intimate Partner Violence: Not At Risk (11/25/2022)   Humiliation, Afraid, Rape, and Kick questionnaire    Fear of Current or Ex-Partner: No    Emotionally Abused: No    Physically Abused: No    Sexually Abused: No  Social History   Tobacco Use  Smoking Status Never  Smokeless Tobacco Never   Social History   Substance and Sexual Activity  Alcohol Use  Never   Comment: Occasional glass of wine less than monthly    Family History:  Family History  Problem Relation Age of Onset   Diabetes Mother    Lung cancer Father    Cancer Father    Stroke Paternal Grandfather    Cancer Brother    Cancer Sister     Past medical history, surgical history, medications, allergies, family history and social history reviewed with patient today and changes made to appropriate areas of the chart.   ROS All other ROS negative except what is listed above and in the HPI.      Objective:    BP 128/73 (BP Location: Left Arm, Patient Position: Sitting, Cuff Size: Normal)   Pulse (!) 56   Temp 97.9 F (36.6 C) (Oral)   Resp 14   Ht 5' 9 (1.753 m)   Wt 208 lb 9.6 oz (94.6 kg)   SpO2 97%   BMI 30.80 kg/m   Wt Readings from Last 3 Encounters:  11/27/23 208 lb 9.6 oz (94.6 kg)  10/21/23 207 lb 3.2 oz (94 kg)  09/21/23 209 lb (94.8 kg)    Physical Exam Vitals and nursing note reviewed.  Constitutional:      General: He is awake. He is not in acute distress.    Appearance: He is well-developed and well-groomed. He is not ill-appearing or toxic-appearing.  HENT:     Head: Normocephalic and atraumatic.     Right Ear: Hearing, tympanic membrane, ear canal and external ear normal. No drainage.     Left Ear: Hearing, tympanic membrane, ear canal and external ear normal. No drainage.     Nose: Nose normal.     Mouth/Throat:     Pharynx: Uvula midline.  Eyes:     General: Lids are normal.        Right eye: No discharge.        Left eye: No discharge.     Extraocular Movements: Extraocular movements intact.     Conjunctiva/sclera: Conjunctivae normal.     Pupils: Pupils are equal, round, and reactive to light.     Visual Fields: Right eye visual fields normal and left eye visual fields normal.  Neck:     Thyroid : No thyromegaly.     Vascular: No carotid bruit or JVD.     Trachea: Trachea normal.  Cardiovascular:     Rate and Rhythm: Regular  rhythm. Bradycardia present.     Heart sounds: Normal heart sounds, S1 normal and S2 normal. No murmur heard.    No gallop.  Pulmonary:     Effort: Pulmonary effort is normal. No accessory muscle usage or respiratory distress.     Breath sounds: Normal breath sounds.  Abdominal:     General: Bowel sounds are normal.     Palpations: Abdomen is soft. There is no hepatomegaly or splenomegaly.     Tenderness: There is no abdominal tenderness.  Musculoskeletal:        General: Normal range of motion.     Cervical back: Normal range of motion and neck supple.     Right lower leg: No edema.     Left lower leg: No edema.  Lymphadenopathy:     Head:     Right side of head: No submental, submandibular, tonsillar, preauricular or posterior auricular adenopathy.  Left side of head: No submental, submandibular, tonsillar, preauricular or posterior auricular adenopathy.     Cervical: No cervical adenopathy.  Skin:    General: Skin is warm and dry.     Capillary Refill: Capillary refill takes less than 2 seconds.     Findings: No rash.  Neurological:     Mental Status: He is alert and oriented to person, place, and time.     Gait: Gait is intact.     Deep Tendon Reflexes: Reflexes are normal and symmetric.     Reflex Scores:      Brachioradialis reflexes are 2+ on the right side and 2+ on the left side.      Patellar reflexes are 2+ on the right side and 2+ on the left side. Psychiatric:        Attention and Perception: Attention normal.        Mood and Affect: Mood normal.        Speech: Speech normal.        Behavior: Behavior normal. Behavior is cooperative.        Thought Content: Thought content normal.        Cognition and Memory: Cognition normal.       11/25/2022    3:51 PM 11/06/2021    8:36 AM 11/05/2020    8:21 AM 11/04/2019    8:20 AM 11/01/2018    8:17 AM  6CIT Screen  What Year? 0 points 0 points 0 points 0 points 0 points  What month? 0 points 0 points 0 points 0  points 0 points  What time? 0 points 0 points 0 points 0 points 0 points  Count back from 20 0 points 0 points 0 points 0 points 0 points  Months in reverse 0 points 0 points 0 points 0 points 0 points  Repeat phrase 0 points 0 points 2 points 0 points 0 points  Total Score 0 points 0 points 2 points 0 points 0 points   Results for orders placed or performed in visit on 09/21/23  Lipase   Collection Time: 09/21/23  9:10 AM  Result Value Ref Range   Lipase 33 13 - 78 U/L  Amylase   Collection Time: 09/21/23  9:10 AM  Result Value Ref Range   Amylase 51 31 - 110 U/L  Comprehensive metabolic panel with GFR   Collection Time: 09/21/23  9:10 AM  Result Value Ref Range   Glucose 76 70 - 99 mg/dL   BUN 14 8 - 27 mg/dL   Creatinine, Ser 8.91 0.76 - 1.27 mg/dL   eGFR 72 >40 fO/fpw/8.26   BUN/Creatinine Ratio 13 10 - 24   Sodium 142 134 - 144 mmol/L   Potassium 4.3 3.5 - 5.2 mmol/L   Chloride 103 96 - 106 mmol/L   CO2 23 20 - 29 mmol/L   Calcium  9.4 8.6 - 10.2 mg/dL   Total Protein 6.9 6.0 - 8.5 g/dL   Albumin  4.8 3.8 - 4.8 g/dL   Globulin, Total 2.1 1.5 - 4.5 g/dL   Bilirubin Total 0.8 0.0 - 1.2 mg/dL   Alkaline Phosphatase 103 44 - 121 IU/L   AST 23 0 - 40 IU/L   ALT 27 0 - 44 IU/L  CBC with Differential/Platelet   Collection Time: 09/21/23  9:10 AM  Result Value Ref Range   WBC 7.6 3.4 - 10.8 x10E3/uL   RBC 5.64 4.14 - 5.80 x10E6/uL   Hemoglobin 16.3 13.0 - 17.7 g/dL   Hematocrit  50.8 37.5 - 51.0 %   MCV 90 79 - 97 fL   MCH 28.9 26.6 - 33.0 pg   MCHC 32.1 31.5 - 35.7 g/dL   RDW 86.3 88.3 - 84.5 %   Platelets 188 150 - 450 x10E3/uL   Neutrophils 60 Not Estab. %   Lymphs 29 Not Estab. %   Monocytes 8 Not Estab. %   Eos 2 Not Estab. %   Basos 1 Not Estab. %   Neutrophils Absolute 4.5 1.4 - 7.0 x10E3/uL   Lymphocytes Absolute 2.2 0.7 - 3.1 x10E3/uL   Monocytes Absolute 0.6 0.1 - 0.9 x10E3/uL   EOS (ABSOLUTE) 0.2 0.0 - 0.4 x10E3/uL   Basophils Absolute 0.1 0.0 - 0.2  x10E3/uL   Immature Granulocytes 0 Not Estab. %   Immature Grans (Abs) 0.0 0.0 - 0.1 x10E3/uL  HgB A1c   Collection Time: 09/21/23  9:10 AM  Result Value Ref Range   Hgb A1c MFr Bld 6.0 (H) 4.8 - 5.6 %   Est. average glucose Bld gHb Est-mCnc 126 mg/dL  C-reactive protein   Collection Time: 09/21/23  9:10 AM  Result Value Ref Range   CRP <1 0 - 10 mg/L  Sed Rate (ESR)   Collection Time: 09/21/23  9:10 AM  Result Value Ref Range   Sed Rate 2 0 - 30 mm/hr      Assessment & Plan:   Problem List Items Addressed This Visit       Cardiovascular and Mediastinum   PAF (paroxysmal atrial fibrillation) (HCC) - Primary   Chronic, followed by cardiology.  On BB and Baby ASA.  Continue current medication regimen as recommended by them.  Recent notes reviewed.      Relevant Medications   atorvastatin  (LIPITOR ) 80 MG tablet   losartan  (COZAAR ) 25 MG tablet   metoprolol  tartrate (LOPRESSOR ) 25 MG tablet   Essential (primary) hypertension   Chronic, stable.  BP at goal on recheck and on home checks.  Recommend he continue to monitor BP at home daily + document and bring to provider visits.  Continue current medication regimen at this time and adjust as needed. LAB: CBC, CMP, TSH.  Focus on DASH diet at home.        Relevant Medications   atorvastatin  (LIPITOR ) 80 MG tablet   losartan  (COZAAR ) 25 MG tablet   metoprolol  tartrate (LOPRESSOR ) 25 MG tablet   Other Relevant Orders   CBC with Differential/Platelet   Comprehensive metabolic panel with GFR   TSH   CAD (coronary artery disease)   Chronic, stable.  No recent NTG use, monitor closely and if increased use to alert PCP or cardiology.  Continue collaboration with cardiology, recent notes reviewed.      Relevant Medications   atorvastatin  (LIPITOR ) 80 MG tablet   losartan  (COZAAR ) 25 MG tablet   metoprolol  tartrate (LOPRESSOR ) 25 MG tablet     Digestive   GERD without esophagitis   Chronic, stable on daily Nexium .  Continue  current regimen and adjust as needed.  Mag level annually. Obtain next visit.        Genitourinary   BPH (benign prostatic hyperplasia)   Asymptomatic with no current medications.  Will check PSA level today, discussed with patient -- he wishes to continue screening.      Relevant Orders   PSA     Other   Hyperlipemia   Chronic, ongoing.  Continue current medication regimen and adjust as needed.  Lipid panel today.  Relevant Medications   atorvastatin  (LIPITOR ) 80 MG tablet   losartan  (COZAAR ) 25 MG tablet   metoprolol  tartrate (LOPRESSOR ) 25 MG tablet   Other Relevant Orders   Comprehensive metabolic panel with GFR   Lipid Panel w/o Chol/HDL Ratio   Gout   Chronic, stable on Allopurinol  with recent kidney function and uric acid level up to date.  Continue current regimen and adjust as needed.  No recent flares.      Relevant Medications   allopurinol  (ZYLOPRIM ) 300 MG tablet   Other Relevant Orders   Uric acid   Elevated hemoglobin A1c measurement   A1c 6% last check in August 2025, monitor closely and continue diet/exercise focus at home.  Discussed at length with patient.  Recheck A1c next visit.      Body mass index (BMI) 30.0-30.9, adult   BMI 30.80. Recommended eating smaller high protein, low fat meals more frequently and exercising 30 mins a day 5 times a week with a goal of 10-15lb weight loss in the next 3 months. Patient voiced their understanding and motivation to adhere to these recommendations.       Other Visit Diagnoses       Encounter for annual physical exam       Annual physical today with labs and health maintenance reviewed, discussed with patient.       Discussed aspirin  prophylaxis for myocardial infarction prevention and decision was made to continue ASA  LABORATORY TESTING:  Health maintenance labs ordered today as discussed above.   The natural history of prostate cancer and ongoing controversy regarding screening and potential  treatment outcomes of prostate cancer has been discussed with the patient. The meaning of a false positive PSA and a false negative PSA has been discussed. He indicates understanding of the limitations of this screening test and wishes to proceed with screening PSA testing.  Is aware of recommendations not to continue screening after age 49.  IMMUNIZATIONS:   - Tdap: Tetanus vaccination status reviewed: last tetanus booster within 10 years. - Influenza: Refused - Pneumovax: Up to date - Prevnar: Up to date - Zostavax vaccine: Refused  SCREENING: - Colonoscopy: Cologuard due next 10/28/25 Discussed with patient purpose of the colonoscopy is to detect colon cancer at curable precancerous or early stages   - AAA Screening: Not applicable  -Hearing Test: Not applicable  -Spirometry: Not applicable   PATIENT COUNSELING:    Sexuality: Discussed sexually transmitted diseases, partner selection, use of condoms, avoidance of unintended pregnancy  and contraceptive alternatives.   Advised to avoid cigarette smoking.  I discussed with the patient that most people either abstain from alcohol or drink within safe limits (<=14/week and <=4 drinks/occasion for males, <=7/weeks and <= 3 drinks/occasion for females) and that the risk for alcohol disorders and other health effects rises proportionally with the number of drinks per week and how often a drinker exceeds daily limits.  Discussed cessation/primary prevention of drug use and availability of treatment for abuse.   Diet: Encouraged to adjust caloric intake to maintain  or achieve ideal body weight, to reduce intake of dietary saturated fat and total fat, to limit sodium intake by avoiding high sodium foods and not adding table salt, and to maintain adequate dietary potassium and calcium  preferably from fresh fruits, vegetables, and low-fat dairy products.    Stressed the importance of regular exercise  Injury prevention: Discussed safety belts,  safety helmets, smoke detector, smoking near bedding or upholstery.   Dental health: Discussed importance of  regular tooth brushing, flossing, and dental visits.   Follow up plan: NEXT PREVENTATIVE PHYSICAL DUE IN 1 YEAR. Return in about 6 months (around 05/26/2024) for HTN/HLD, IFG, A-FIB.

## 2023-11-27 NOTE — Assessment & Plan Note (Signed)
Chronic, stable on Allopurinol with recent kidney function and uric acid level up to date.  Continue current regimen and adjust as needed.  No recent flares.

## 2023-11-27 NOTE — Assessment & Plan Note (Addendum)
 Chronic, followed by cardiology.  On BB and Baby ASA.  Continue current medication regimen as recommended by them.  Recent notes reviewed.

## 2023-11-28 ENCOUNTER — Ambulatory Visit: Payer: Self-pay | Admitting: Nurse Practitioner

## 2023-11-28 LAB — COMPREHENSIVE METABOLIC PANEL WITH GFR
ALT: 19 IU/L (ref 0–44)
AST: 23 IU/L (ref 0–40)
Albumin: 4.7 g/dL (ref 3.8–4.8)
Alkaline Phosphatase: 103 IU/L (ref 47–123)
BUN/Creatinine Ratio: 13 (ref 10–24)
BUN: 15 mg/dL (ref 8–27)
Bilirubin Total: 0.8 mg/dL (ref 0.0–1.2)
CO2: 24 mmol/L (ref 20–29)
Calcium: 9.7 mg/dL (ref 8.6–10.2)
Chloride: 104 mmol/L (ref 96–106)
Creatinine, Ser: 1.13 mg/dL (ref 0.76–1.27)
Globulin, Total: 2.2 g/dL (ref 1.5–4.5)
Glucose: 91 mg/dL (ref 70–99)
Potassium: 4.6 mmol/L (ref 3.5–5.2)
Sodium: 143 mmol/L (ref 134–144)
Total Protein: 6.9 g/dL (ref 6.0–8.5)
eGFR: 68 mL/min/1.73 (ref >59)

## 2023-11-28 LAB — CBC WITH DIFFERENTIAL/PLATELET
Basophils Absolute: 0.1 x10E3/uL (ref 0.0–0.2)
Basos: 1 %
EOS (ABSOLUTE): 0.2 x10E3/uL (ref 0.0–0.4)
Eos: 2 %
Hematocrit: 49.3 % (ref 37.5–51.0)
Hemoglobin: 16.2 g/dL (ref 13.0–17.7)
Immature Grans (Abs): 0 x10E3/uL (ref 0.0–0.1)
Immature Granulocytes: 0 %
Lymphocytes Absolute: 2.3 x10E3/uL (ref 0.7–3.1)
Lymphs: 32 %
MCH: 30.2 pg (ref 26.6–33.0)
MCHC: 32.9 g/dL (ref 31.5–35.7)
MCV: 92 fL (ref 79–97)
Monocytes Absolute: 0.5 x10E3/uL (ref 0.1–0.9)
Monocytes: 8 %
Neutrophils Absolute: 4 x10E3/uL (ref 1.4–7.0)
Neutrophils: 57 %
Platelets: 186 x10E3/uL (ref 150–450)
RBC: 5.37 x10E6/uL (ref 4.14–5.80)
RDW: 13.2 % (ref 11.6–15.4)
WBC: 7 x10E3/uL (ref 3.4–10.8)

## 2023-11-28 LAB — URIC ACID: Uric Acid: 4.1 mg/dL (ref 3.8–8.4)

## 2023-11-28 LAB — PSA: Prostate Specific Ag, Serum: 4.3 ng/mL — ABNORMAL HIGH (ref 0.0–4.0)

## 2023-11-28 LAB — LIPID PANEL W/O CHOL/HDL RATIO
Cholesterol, Total: 114 mg/dL (ref 100–199)
HDL: 38 mg/dL — ABNORMAL LOW (ref >39)
LDL Chol Calc (NIH): 55 mg/dL (ref 0–99)
Triglycerides: 112 mg/dL (ref 0–149)
VLDL Cholesterol Cal: 21 mg/dL (ref 5–40)

## 2023-11-28 LAB — TSH: TSH: 1.73 u[IU]/mL (ref 0.450–4.500)

## 2023-11-28 NOTE — Progress Notes (Signed)
 Contacted via MyChart  Good morning Vontae, your labs have returned. Overall these are stable with exception of PSA, prostate level, has trended up a little. It is in range of normal for your age and ethnicity, but we will recheck next visit. I suspect this is related to some increase in size of prostate with aging, which is normal. Any questions? Continue all current medications. Keep being amazing!!  Thank you for allowing me to participate in your care.  I appreciate you. Kindest regards, Maridee Slape

## 2024-01-18 ENCOUNTER — Other Ambulatory Visit: Payer: Self-pay | Admitting: Nurse Practitioner

## 2024-01-18 DIAGNOSIS — K219 Gastro-esophageal reflux disease without esophagitis: Secondary | ICD-10-CM

## 2024-01-29 ENCOUNTER — Encounter: Payer: Self-pay | Admitting: Nurse Practitioner

## 2024-01-29 DIAGNOSIS — I25118 Atherosclerotic heart disease of native coronary artery with other forms of angina pectoris: Secondary | ICD-10-CM

## 2024-01-29 MED ORDER — NITROGLYCERIN 0.4 MG SL SUBL: 0.4000 mg | SUBLINGUAL_TABLET | SUBLINGUAL | 3 refills | Status: AC | PRN

## 2024-02-09 ENCOUNTER — Ambulatory Visit (INDEPENDENT_AMBULATORY_CARE_PROVIDER_SITE_OTHER): Admitting: Emergency Medicine

## 2024-02-09 VITALS — BP 128/78 | HR 53 | Ht 69.0 in | Wt 207.6 lb

## 2024-02-09 DIAGNOSIS — Z0000 Encounter for general adult medical examination without abnormal findings: Secondary | ICD-10-CM

## 2024-02-09 NOTE — Patient Instructions (Signed)
 Thomas Miller,  Thank you for taking the time for your Medicare Wellness Visit. I appreciate your continued commitment to your health goals. Please review the care plan we discussed, and feel free to reach out if I can assist you further.  Please note that Annual Wellness Visits do not include a physical exam. Some assessments may be limited, especially if the visit was conducted virtually. If needed, we may recommend an in-person follow-up with your provider.  Ongoing Care Seeing your primary care provider every 3 to 6 months helps us  monitor your health and provide consistent, personalized care.   Referrals If a referral was made during today's visit and you haven't received any updates within two weeks, please contact the referred provider directly to check on the status.  Recommended Screenings:  Keep up the good work!!  Health Maintenance  Topic Date Due   Zoster (Shingles) Vaccine (1 of 2) 09/08/1999   COVID-19 Vaccine (4 - 2025-26 season) 09/28/2023   Medicare Annual Wellness Visit  11/25/2023   Flu Shot  04/26/2024*   Cologuard (Stool DNA test)  10/28/2025   DTaP/Tdap/Td vaccine (3 - Td or Tdap) 10/10/2031   Hepatitis C Screening  Completed   Meningitis B Vaccine  Aged Out   Pneumococcal Vaccine for age over 69  Discontinued  *Topic was postponed. The date shown is not the original due date.       02/09/2024    8:46 AM  Advanced Directives  Does Patient Have a Medical Advance Directive? Yes  Type of Estate Agent of [place];Living will  Does patient want to make changes to medical advance directive? No - Patient declined  Copy of Healthcare Power of Attorney in Chart? Yes - validated most recent copy scanned in chart (See row information)    Vision: Annual vision screenings are recommended for early detection of glaucoma, cataracts, and diabetic retinopathy. These exams can also reveal signs of chronic conditions such as diabetes and high blood  pressure.  Dental: Annual dental screenings help detect early signs of oral cancer, gum disease, and other conditions linked to overall health, including heart disease and diabetes.  Please see the attached documents for additional preventive care recommendations.

## 2024-02-09 NOTE — Progress Notes (Signed)
 "  Chief Complaint  Patient presents with   Medicare Wellness     Subjective:   Thomas Miller is a 74 y.o. male who presents for a Medicare Annual Wellness Visit.  Visit info / Clinical Intake: Medicare Wellness Visit Type:: Subsequent Annual Wellness Visit Persons participating in visit and providing information:: patient Medicare Wellness Visit Mode:: In-person (required for WTM) Interpreter Needed?: No Pre-visit prep was completed: yes AWV questionnaire completed by patient prior to visit?: yes Date:: 02/05/24 Living arrangements:: (!) lives alone Patient's Overall Health Status Rating: good Typical amount of pain: some Does pain affect daily life?: no Are you currently prescribed opioids?: no  Dietary Habits and Nutritional Risks How many meals a day?: 3 Eats fruit and vegetables daily?: yes Most meals are obtained by: preparing own meals In the last 2 weeks, have you had any of the following?: none Diabetic:: no  Functional Status Activities of Daily Living (to include ambulation/medication): Independent Ambulation: Independent Medication Administration: Independent Home Management (perform basic housework or laundry): Independent Manage your own finances?: yes Primary transportation is: driving Concerns about vision?: (!) yes (having eyelid surgery 02/2024) Concerns about hearing?: (!) yes Uses hearing aids?: no Hear whispered voice?: (!) no *in-person visit only*  Fall Screening Falls in the past year?: 0 Number of falls in past year: 0 Was there an injury with Fall?: 0 Fall Risk Category Calculator: 0 Patient Fall Risk Level: Low Fall Risk  Fall Risk Patient at Risk for Falls Due to: No Fall Risks Fall risk Follow up: Falls evaluation completed  Home and Transportation Safety: All rugs have non-skid backing?: yes All stairs or steps have railings?: yes Grab bars in the bathtub or shower?: yes Have non-skid surface in bathtub or shower?: yes Good home  lighting?: yes Regular seat belt use?: yes Hospital stays in the last year:: no  Cognitive Assessment Difficulty concentrating, remembering, or making decisions? : no Will 6CIT or Mini Cog be Completed: yes What year is it?: 0 points What month is it?: 0 points Give patient an address phrase to remember (5 components): [address] [place] About what time is it?: 0 points Count backwards from 20 to 1: 0 points Say the months of the year in reverse: 0 points Repeat the address phrase from earlier: 2 points 6 CIT Score: 2 points  Advance Directives (For Healthcare) Does Patient Have a Medical Advance Directive?: Yes Does patient want to make changes to medical advance directive?: No - Patient declined Type of Advance Directive: Healthcare Power of [place]; Living will Copy of Healthcare Power of Attorney in Chart?: Yes - validated most recent copy scanned in chart (See row information) Copy of Living Will in Chart?: Yes - validated most recent copy scanned in chart (See row information)  Reviewed/Updated  Reviewed/Updated: Reviewed All (Medical, Surgical, Family, Medications, Allergies, Care Teams, Patient Goals)    Allergies (verified) Doxycycline, Ace inhibitors, Codeine, and Naprosyn [naproxen]   Current Medications (verified) Outpatient Encounter Medications as of 02/09/2024  Medication Sig   allopurinol  (ZYLOPRIM ) 300 MG tablet Take 1 tablet (300 mg total) by mouth daily.   [place] Cider Vinegar 600 MG CAPS Take 600 mg by mouth daily.   aspirin  EC 81 MG tablet Take 2 tablets (162 mg total) by mouth daily.   atorvastatin  (LIPITOR ) 80 MG tablet Take 1 tablet (80 mg total) by mouth daily at 6 PM.   esomeprazole  (NEXIUM ) 40 MG capsule Take 1 capsule (40 mg total) by mouth daily before breakfast.  Liniments (DEEP BLUE RELIEF) GEL Apply 1 application topically as needed for pain (joints). In joints   losartan  (COZAAR ) 25 MG tablet Take 1 tablet (25 mg total) by mouth daily.    metoprolol  tartrate (LOPRESSOR ) 25 MG tablet Take 1 tablet (25 mg total) by mouth 2 (two) times daily.   nitroGLYCERIN  (NITROSTAT ) 0.4 MG SL tablet Place 1 tablet (0.4 mg total) under the tongue every 5 (five) minutes as needed for chest pain.   tiZANidine  (ZANAFLEX ) 4 MG tablet Take 1 tablet (4 mg total) by mouth every 6 (six) hours as needed for muscle spasms.   VITAMIN D, CHOLECALCIFEROL, PO Take 2,000 mg by mouth daily.   No facility-administered encounter medications on file as of 02/09/2024.    History: Past Medical History:  Diagnosis Date   Advanced care planning/counseling discussion 11/27/2016   Allergy 1986   Aortic atherosclerosis 03/24/2020   Noted on CT 03/01/19   Arthritis 1992   Body mass index (BMI) 30.0-30.9, adult 12/11/2020   BPH (benign prostatic hyperplasia) 10/25/2014   CAD (coronary artery disease) 08/07/2017   Cataract 2019   Chronic low back pain without sciatica 11/21/2020   Elevated hemoglobin A1c measurement 04/06/2022   Essential (primary) hypertension 10/25/2014   GERD without esophagitis 10/25/2014   Gout    Hearing loss    History of kidney stones    History of stroke 10/05/2019   Hyperlipemia 10/25/2014   Insomnia 05/19/2016   Myocardial infarction (HCC) 2019   PAF (paroxysmal atrial fibrillation) (HCC) 08/07/2017   Pain of right heel 04/06/2023   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   S/P epidural steroid injection 05/2018   Stroke (HCC) 2021   Past Surgical History:  Procedure Laterality Date   BACK SURGERY     CHOLECYSTECTOMY  1996   CORONARY ARTERY BYPASS GRAFT N/A 07/20/2017   Procedure: CORONARY ARTERY BYPASS GRAFTING (CABG) times four using left internal mammary artery to LAD and endoscopically harvested right saphenous vein graft to distal circumflex, intermediate, and PD;  Surgeon: Army Dallas NOVAK, MD;  Location: [facility] OR;  Service: Open Heart Surgery;  Laterality: N/A;   EPIDURAL BLOCK INJECTION  05/2018   L3injections    EYE SURGERY Bilateral  2013   FOOT SURGERY Bilateral    ingrown toenails   GALLBLADDER SURGERY     LEFT HEART CATH AND CORONARY ANGIOGRAPHY N/A 07/17/2017   Procedure: LEFT HEART CATH AND CORONARY ANGIOGRAPHY;  Surgeon: Mady Bruckner, MD;  Location: [facility] INVASIVE CV LAB;  Service: Cardiovascular;  Laterality: N/A;   SHOULDER SURGERY Right    SPINE SURGERY     Family History  Problem Relation Age of Onset   Diabetes Mother    Lung cancer Father    Cancer Father    Stroke Paternal Grandfather    Cancer Brother    Cancer Sister    Social History   Occupational History   Occupation: retired  Tobacco Use   Smoking status: Never   Smokeless tobacco: Never  Vaping Use   Vaping status: Never Used  Substance and Sexual Activity   Alcohol use: Not Currently    Comment: Occasional glass of wine less than monthly, last use 2021   Drug use: Never   Sexual activity: Not Currently   Tobacco Counseling Counseling given: Not Answered  SDOH Screenings   Food Insecurity: No Food Insecurity (02/09/2024)  Housing: Low Risk (02/09/2024)  Transportation Needs: No Transportation Needs (02/09/2024)  Utilities: Not At Risk (02/09/2024)  Alcohol Screen: Low Risk (11/24/2022)  Depression (PHQ2-9): Low Risk (02/09/2024)  Financial Resource Strain: Low Risk (11/23/2023)  Physical Activity: Sufficiently Active (02/09/2024)  Social Connections: Moderately Integrated (02/09/2024)  Stress: No Stress Concern Present (02/09/2024)  Tobacco Use: Low Risk (02/09/2024)  Health Literacy: Adequate Health Literacy (02/09/2024)   See flowsheets for full screening details  Depression Screen PHQ 2 & 9 Depression Scale- Over the past 2 weeks, how often have you been bothered by any of the following problems? Little interest or pleasure in doing things: 0 Feeling down, depressed, or hopeless (PHQ Adolescent also includes...irritable): 0 PHQ-2 Total Score: 0 Trouble falling or staying asleep, or sleeping too much: 0 Feeling tired or  having little energy: 0 Poor appetite or overeating (PHQ Adolescent also includes...weight loss): 0 Feeling bad about yourself - or that you are a failure or have let yourself or your family down: 0 Trouble concentrating on things, such as reading the newspaper or watching television (PHQ Adolescent also includes...like school work): 0 Moving or speaking so slowly that other people could have noticed. Or the opposite - being so fidgety or restless that you have been moving around a lot more than usual: 0 Thoughts that you would be better off dead, or of hurting yourself in some way: 0 PHQ-9 Total Score: 0 If you checked off any problems, how difficult have these problems made it for you to do your work, take care of things at home, or get along with other people?: Not difficult at all  Depression Treatment Depression Interventions/Treatment : [id number] Score <4 Follow-up Not Indicated     Goals Addressed               This Visit's Progress     COMPLETED: Patient Stated (pt-stated)        Play 2 times per week      Play 50 rounds of golf per year (pt-stated)               Objective:    Today's Vitals   02/09/24 0836  BP: 128/78  Pulse: (!) 53  SpO2: 96%  Weight: 207 lb 9.6 oz (94.2 kg)  Height: 5' 9 (1.753 m)   Body mass index is 30.66 kg/m.  Hearing/Vision screen Hearing Screening - Comments:: Some hearing loss at high frequency Vision Screening - Comments:: UTD w/ Dr. Jaye at [facility] [place] Immunizations and Health Maintenance Health Maintenance  Topic Date Due   Zoster Vaccines- Shingrix (1 of 2) 09/08/1999   COVID-19 Vaccine (4 - 2025-26 season) 09/28/2023   Influenza Vaccine  04/26/2024 (Originally 08/28/2023)   Medicare Annual Wellness (AWV)  02/08/2025   Fecal DNA (Cologuard)  10/28/2025   DTaP/Tdap/Td (3 - Td or Tdap) 10/10/2031   Hepatitis C Screening  Completed   Meningococcal B Vaccine  Aged Out   Pneumococcal Vaccine: 50+ Years   Discontinued        Assessment/Plan:  This is a routine wellness examination for [place].  Patient Care Team: Cannady, Jolene T, NP as PCP - General (Nurse Practitioner) Jaye Fallow, MD as Referring Physician (Ophthalmology) Revankar, Jennifer SAUNDERS, MD as Consulting Physician (Cardiology)  I have personally reviewed and noted the following in the patients chart:   Medical and social history Use of alcohol, tobacco or illicit drugs  Current medications and supplements including opioid prescriptions. Functional ability and status Nutritional status Physical activity Advanced directives List of other physicians Hospitalizations, surgeries, and ER visits in previous 12 months Vitals Screenings to include cognitive, depression, and falls Referrals  and appointments  No orders of the defined types were placed in this encounter.  In addition, I have reviewed and discussed with patient certain preventive protocols, quality metrics, and best practice recommendations. A written personalized care plan for preventive services as well as general preventive health recommendations were provided to patient.   Vina Ned, CMA   02/09/2024   Return in 53 weeks (on 02/14/2025) for Medicare Annual Wellness Visit.  After Visit Summary: (In Person-Declined) Patient declined AVS at this time.  Nurse Notes:  6 CIT Score - 2 Declined flu, pneumonia, shingles and covid vaccines "

## 2024-05-27 ENCOUNTER — Ambulatory Visit: Admitting: Nurse Practitioner

## 2025-02-14 ENCOUNTER — Ambulatory Visit
